# Patient Record
Sex: Female | Born: 1935 | Race: Black or African American | Hispanic: No | Smoking: Former smoker
Health system: Southern US, Community
[De-identification: ages and names within clinical notes are randomized; demographics above are authoritative.]

## PROBLEM LIST (undated history)

## (undated) DIAGNOSIS — E119 Type 2 diabetes mellitus without complications: Secondary | ICD-10-CM

## (undated) DIAGNOSIS — M545 Low back pain, unspecified: Secondary | ICD-10-CM

## (undated) DIAGNOSIS — I10 Essential (primary) hypertension: Secondary | ICD-10-CM

## (undated) DIAGNOSIS — I4891 Unspecified atrial fibrillation: Secondary | ICD-10-CM

## (undated) DIAGNOSIS — E039 Hypothyroidism, unspecified: Secondary | ICD-10-CM

## (undated) DIAGNOSIS — F419 Anxiety disorder, unspecified: Secondary | ICD-10-CM

## (undated) DIAGNOSIS — Z860100 Personal history of colon polyps, unspecified: Secondary | ICD-10-CM

## (undated) DIAGNOSIS — G894 Chronic pain syndrome: Secondary | ICD-10-CM

## (undated) DIAGNOSIS — I50810 Right heart failure, unspecified: Secondary | ICD-10-CM

## (undated) DIAGNOSIS — F329 Major depressive disorder, single episode, unspecified: Secondary | ICD-10-CM

## (undated) DIAGNOSIS — I499 Cardiac arrhythmia, unspecified: Secondary | ICD-10-CM

## (undated) DIAGNOSIS — K279 Peptic ulcer, site unspecified, unspecified as acute or chronic, without hemorrhage or perforation: Secondary | ICD-10-CM

## (undated) DIAGNOSIS — E669 Obesity, unspecified: Secondary | ICD-10-CM

## (undated) DIAGNOSIS — R601 Generalized edema: Secondary | ICD-10-CM

## (undated) DIAGNOSIS — M1990 Unspecified osteoarthritis, unspecified site: Secondary | ICD-10-CM

## (undated) DIAGNOSIS — I509 Heart failure, unspecified: Secondary | ICD-10-CM

## (undated) DIAGNOSIS — N183 Chronic kidney disease, stage 3 (moderate): Secondary | ICD-10-CM

## (undated) DIAGNOSIS — F32A Depression, unspecified: Secondary | ICD-10-CM

## (undated) DIAGNOSIS — D509 Iron deficiency anemia, unspecified: Secondary | ICD-10-CM

## (undated) DIAGNOSIS — Z86018 Personal history of other benign neoplasm: Secondary | ICD-10-CM

## (undated) DIAGNOSIS — I272 Pulmonary hypertension, unspecified: Secondary | ICD-10-CM

## (undated) DIAGNOSIS — E785 Hyperlipidemia, unspecified: Secondary | ICD-10-CM

## (undated) DIAGNOSIS — K5730 Diverticulosis of large intestine without perforation or abscess without bleeding: Secondary | ICD-10-CM

## (undated) DIAGNOSIS — D638 Anemia in other chronic diseases classified elsewhere: Secondary | ICD-10-CM

## (undated) DIAGNOSIS — J90 Pleural effusion, not elsewhere classified: Secondary | ICD-10-CM

## (undated) HISTORY — DX: Hyperlipidemia, unspecified: E78.5

## (undated) HISTORY — PX: ABDOMINAL HYSTERECTOMY: SHX81

## (undated) HISTORY — PX: LAPAROSCOPIC HYSTERECTOMY: SHX1926

## (undated) HISTORY — DX: Hypothyroidism, unspecified: E03.9

## (undated) HISTORY — DX: Low back pain: M54.5

## (undated) HISTORY — DX: Chronic kidney disease, stage 3 (moderate): N18.3

## (undated) HISTORY — DX: Chronic pain syndrome: G89.4

## (undated) HISTORY — DX: Unspecified atrial fibrillation: I48.91

## (undated) HISTORY — DX: Unspecified osteoarthritis, unspecified site: M19.90

## (undated) HISTORY — DX: Type 2 diabetes mellitus without complications: E11.9

## (undated) HISTORY — DX: Right heart failure, unspecified: I50.810

## (undated) HISTORY — DX: Obesity, unspecified: E66.9

## (undated) HISTORY — DX: Iron deficiency anemia, unspecified: D50.9

## (undated) HISTORY — DX: Personal history of other benign neoplasm: Z86.018

## (undated) HISTORY — DX: Essential (primary) hypertension: I10

## (undated) HISTORY — DX: Pulmonary hypertension, unspecified: I27.20

## (undated) HISTORY — DX: Diverticulosis of large intestine without perforation or abscess without bleeding: K57.30

## (undated) HISTORY — DX: Low back pain, unspecified: M54.50

## (undated) HISTORY — DX: Anxiety disorder, unspecified: F41.9

## (undated) HISTORY — DX: Depression, unspecified: F32.A

## (undated) HISTORY — DX: Personal history of colon polyps, unspecified: Z86.0100

## (undated) HISTORY — DX: Peptic ulcer, site unspecified, unspecified as acute or chronic, without hemorrhage or perforation: K27.9

## (undated) HISTORY — DX: Major depressive disorder, single episode, unspecified: F32.9

## (undated) HISTORY — DX: Personal history of colonic polyps: Z86.010

---

## 1999-01-12 ENCOUNTER — Other Ambulatory Visit: Admission: RE | Admit: 1999-01-12 | Discharge: 1999-01-12 | Payer: Self-pay | Admitting: Obstetrics and Gynecology

## 1999-07-01 ENCOUNTER — Other Ambulatory Visit: Admission: RE | Admit: 1999-07-01 | Discharge: 1999-07-01 | Payer: Self-pay | Admitting: Obstetrics and Gynecology

## 2000-01-19 ENCOUNTER — Encounter: Payer: Self-pay | Admitting: Internal Medicine

## 2000-01-19 ENCOUNTER — Ambulatory Visit (HOSPITAL_COMMUNITY): Admission: RE | Admit: 2000-01-19 | Discharge: 2000-01-19 | Payer: Self-pay | Admitting: Internal Medicine

## 2000-01-27 ENCOUNTER — Encounter (INDEPENDENT_AMBULATORY_CARE_PROVIDER_SITE_OTHER): Payer: Self-pay | Admitting: Specialist

## 2000-01-27 ENCOUNTER — Other Ambulatory Visit: Admission: RE | Admit: 2000-01-27 | Discharge: 2000-01-27 | Payer: Self-pay | Admitting: Gastroenterology

## 2000-02-19 ENCOUNTER — Other Ambulatory Visit: Admission: RE | Admit: 2000-02-19 | Discharge: 2000-02-19 | Payer: Self-pay | Admitting: Obstetrics and Gynecology

## 2001-07-05 ENCOUNTER — Other Ambulatory Visit: Admission: RE | Admit: 2001-07-05 | Discharge: 2001-07-05 | Payer: Self-pay | Admitting: Obstetrics and Gynecology

## 2002-01-01 ENCOUNTER — Other Ambulatory Visit: Admission: RE | Admit: 2002-01-01 | Discharge: 2002-01-01 | Payer: Self-pay | Admitting: Obstetrics and Gynecology

## 2002-07-19 ENCOUNTER — Other Ambulatory Visit: Admission: RE | Admit: 2002-07-19 | Discharge: 2002-07-19 | Payer: Self-pay | Admitting: Obstetrics and Gynecology

## 2004-05-14 ENCOUNTER — Ambulatory Visit: Payer: Self-pay | Admitting: Internal Medicine

## 2005-01-19 ENCOUNTER — Ambulatory Visit: Payer: Self-pay | Admitting: Internal Medicine

## 2005-05-18 ENCOUNTER — Ambulatory Visit: Payer: Self-pay | Admitting: Internal Medicine

## 2005-11-16 ENCOUNTER — Ambulatory Visit: Payer: Self-pay | Admitting: Internal Medicine

## 2006-05-16 ENCOUNTER — Ambulatory Visit: Payer: Self-pay | Admitting: Internal Medicine

## 2006-05-16 LAB — CONVERTED CEMR LAB
ALT: 31 units/L (ref 0–40)
AST: 27 units/L (ref 0–37)
Albumin: 3.8 g/dL (ref 3.5–5.2)
Alkaline Phosphatase: 82 units/L (ref 39–117)
BUN: 18 mg/dL (ref 6–23)
CO2: 34 meq/L — ABNORMAL HIGH (ref 19–32)
Calcium: 10 mg/dL (ref 8.4–10.5)
Chloride: 103 meq/L (ref 96–112)
Chol/HDL Ratio, serum: 3
Cholesterol: 169 mg/dL (ref 0–200)
Creatinine, Ser: 1.6 mg/dL — ABNORMAL HIGH (ref 0.4–1.2)
GFR calc non Af Amer: 34 mL/min
Glomerular Filtration Rate, Af Am: 41 mL/min/{1.73_m2}
Glucose, Bld: 117 mg/dL — ABNORMAL HIGH (ref 70–99)
HDL: 56.8 mg/dL (ref >39.0)
Hgb A1c MFr Bld: 6.3 % — ABNORMAL HIGH (ref 4.6–6.0)
LDL Cholesterol: 98 mg/dL (ref 0–99)
Potassium: 4.6 meq/L (ref 3.5–5.1)
Sodium: 144 meq/L (ref 135–145)
Total Bilirubin: 1.1 mg/dL (ref 0.3–1.2)
Total Protein: 7.1 g/dL (ref 6.0–8.3)
Triglyceride fasting, serum: 73 mg/dL (ref 0–149)
VLDL: 15 mg/dL (ref 0–40)

## 2006-10-25 ENCOUNTER — Ambulatory Visit: Payer: Self-pay | Admitting: Internal Medicine

## 2006-10-25 LAB — CONVERTED CEMR LAB
ALT: 29 units/L (ref 0–40)
AST: 26 units/L (ref 0–37)
Albumin: 3.8 g/dL (ref 3.5–5.2)
Alkaline Phosphatase: 62 units/L (ref 39–117)
BUN: 19 mg/dL (ref 6–23)
Basophils Absolute: 0 10*3/uL (ref 0.0–0.1)
Basophils Relative: 0.4 % (ref 0.0–1.0)
Bilirubin, Direct: 0.2 mg/dL (ref 0.0–0.3)
CO2: 29 meq/L (ref 19–32)
Calcium: 9.9 mg/dL (ref 8.4–10.5)
Chloride: 108 meq/L (ref 96–112)
Cholesterol: 146 mg/dL (ref 0–200)
Creatinine, Ser: 1.2 mg/dL (ref 0.4–1.2)
Creatinine,U: 76.2 mg/dL
Eosinophils Absolute: 0.1 10*3/uL (ref 0.0–0.6)
Eosinophils Relative: 1.2 % (ref 0.0–5.0)
GFR calc Af Amer: 57 mL/min
GFR calc non Af Amer: 47 mL/min
Glucose, Bld: 106 mg/dL — ABNORMAL HIGH (ref 70–99)
HCT: 35.6 % — ABNORMAL LOW (ref 36.0–46.0)
HDL: 51.4 mg/dL (ref >39.0)
Hemoglobin: 11.7 g/dL — ABNORMAL LOW (ref 12.0–15.0)
Hgb A1c MFr Bld: 6.5 % — ABNORMAL HIGH (ref 4.6–6.0)
LDL Cholesterol: 81 mg/dL (ref 0–99)
Lymphocytes Relative: 19.8 % (ref 12.0–46.0)
MCHC: 33 g/dL (ref 30.0–36.0)
MCV: 90.4 fL (ref 78.0–100.0)
Microalb Creat Ratio: 261.2 mg/g — ABNORMAL HIGH (ref 0.0–30.0)
Microalb, Ur: 19.9 mg/dL — ABNORMAL HIGH (ref 0.0–1.9)
Monocytes Absolute: 0.2 10*3/uL (ref 0.2–0.7)
Monocytes Relative: 3.6 % (ref 3.0–11.0)
Neutro Abs: 3.8 10*3/uL (ref 1.4–7.7)
Neutrophils Relative %: 75 % (ref 43.0–77.0)
Platelets: 216 10*3/uL (ref 150–400)
Potassium: 4.4 meq/L (ref 3.5–5.1)
RBC: 3.94 M/uL (ref 3.87–5.11)
RDW: 13.6 % (ref 11.5–14.6)
Sodium: 142 meq/L (ref 135–145)
TSH: 2.02 microintl units/mL (ref 0.35–5.50)
Total Bilirubin: 1.2 mg/dL (ref 0.3–1.2)
Total CHOL/HDL Ratio: 2.8
Total Protein: 6.4 g/dL (ref 6.0–8.3)
Triglycerides: 69 mg/dL (ref 0–149)
VLDL: 14 mg/dL (ref 0–40)
WBC: 5.1 10*3/uL (ref 4.5–10.5)

## 2006-11-07 ENCOUNTER — Ambulatory Visit: Payer: Self-pay | Admitting: Cardiology

## 2006-11-08 ENCOUNTER — Ambulatory Visit: Payer: Self-pay

## 2006-11-08 ENCOUNTER — Encounter: Payer: Self-pay | Admitting: Cardiology

## 2006-12-05 ENCOUNTER — Ambulatory Visit: Payer: Self-pay | Admitting: Cardiology

## 2006-12-08 ENCOUNTER — Ambulatory Visit: Payer: Self-pay

## 2006-12-08 ENCOUNTER — Ambulatory Visit: Payer: Self-pay | Admitting: Cardiology

## 2006-12-12 ENCOUNTER — Ambulatory Visit: Payer: Self-pay | Admitting: Cardiology

## 2006-12-16 ENCOUNTER — Ambulatory Visit: Payer: Self-pay | Admitting: Cardiovascular Disease

## 2006-12-23 ENCOUNTER — Ambulatory Visit: Payer: Self-pay | Admitting: Cardiology

## 2007-01-06 ENCOUNTER — Ambulatory Visit: Payer: Self-pay | Admitting: Cardiology

## 2007-02-03 ENCOUNTER — Ambulatory Visit: Payer: Self-pay | Admitting: Cardiology

## 2007-03-03 ENCOUNTER — Ambulatory Visit: Payer: Self-pay | Admitting: Cardiology

## 2007-03-05 ENCOUNTER — Encounter: Payer: Self-pay | Admitting: Internal Medicine

## 2007-03-05 DIAGNOSIS — Z860100 Personal history of colon polyps, unspecified: Secondary | ICD-10-CM | POA: Insufficient documentation

## 2007-03-05 DIAGNOSIS — F329 Major depressive disorder, single episode, unspecified: Secondary | ICD-10-CM

## 2007-03-05 DIAGNOSIS — E669 Obesity, unspecified: Secondary | ICD-10-CM

## 2007-03-05 DIAGNOSIS — E785 Hyperlipidemia, unspecified: Secondary | ICD-10-CM

## 2007-03-05 DIAGNOSIS — K5730 Diverticulosis of large intestine without perforation or abscess without bleeding: Secondary | ICD-10-CM | POA: Insufficient documentation

## 2007-03-05 DIAGNOSIS — K279 Peptic ulcer, site unspecified, unspecified as acute or chronic, without hemorrhage or perforation: Secondary | ICD-10-CM | POA: Insufficient documentation

## 2007-03-05 DIAGNOSIS — I10 Essential (primary) hypertension: Secondary | ICD-10-CM | POA: Insufficient documentation

## 2007-03-05 DIAGNOSIS — M545 Low back pain, unspecified: Secondary | ICD-10-CM

## 2007-03-05 DIAGNOSIS — D509 Iron deficiency anemia, unspecified: Secondary | ICD-10-CM

## 2007-03-05 DIAGNOSIS — I50810 Right heart failure, unspecified: Secondary | ICD-10-CM

## 2007-03-05 DIAGNOSIS — I482 Chronic atrial fibrillation, unspecified: Secondary | ICD-10-CM

## 2007-03-05 DIAGNOSIS — D259 Leiomyoma of uterus, unspecified: Secondary | ICD-10-CM

## 2007-03-05 DIAGNOSIS — M1990 Unspecified osteoarthritis, unspecified site: Secondary | ICD-10-CM | POA: Insufficient documentation

## 2007-03-05 HISTORY — DX: Iron deficiency anemia, unspecified: D50.9

## 2007-03-05 HISTORY — DX: Right heart failure, unspecified: I50.810

## 2007-03-05 HISTORY — DX: Low back pain, unspecified: M54.50

## 2007-03-05 HISTORY — DX: Hyperlipidemia, unspecified: E78.5

## 2007-03-31 ENCOUNTER — Ambulatory Visit: Payer: Self-pay | Admitting: Internal Medicine

## 2007-04-04 ENCOUNTER — Ambulatory Visit: Payer: Self-pay | Admitting: Cardiology

## 2007-04-28 ENCOUNTER — Ambulatory Visit: Payer: Self-pay | Admitting: Cardiology

## 2007-05-29 ENCOUNTER — Ambulatory Visit: Payer: Self-pay | Admitting: Internal Medicine

## 2007-06-08 ENCOUNTER — Encounter (INDEPENDENT_AMBULATORY_CARE_PROVIDER_SITE_OTHER): Payer: Self-pay | Admitting: *Deleted

## 2007-06-08 ENCOUNTER — Ambulatory Visit: Payer: Self-pay | Admitting: Internal Medicine

## 2007-06-08 LAB — CONVERTED CEMR LAB
ALT: 22 units/L (ref 0–35)
AST: 21 units/L (ref 0–37)
Albumin: 3.8 g/dL (ref 3.5–5.2)
Alkaline Phosphatase: 64 units/L (ref 39–117)
BUN: 24 mg/dL — ABNORMAL HIGH (ref 6–23)
Basophils Absolute: 0 10*3/uL (ref 0.0–0.1)
Basophils Relative: 0.6 % (ref 0.0–1.0)
Bilirubin Urine: NEGATIVE
Bilirubin, Direct: 0.2 mg/dL (ref 0.0–0.3)
CO2: 32 meq/L (ref 19–32)
Calcium: 9.9 mg/dL (ref 8.4–10.5)
Chloride: 105 meq/L (ref 96–112)
Cholesterol: 213 mg/dL (ref 0–200)
Creatinine, Ser: 1.3 mg/dL — ABNORMAL HIGH (ref 0.4–1.2)
Creatinine,U: 272.3 mg/dL
Crystals: NEGATIVE
Direct LDL: 134 mg/dL
Eosinophils Absolute: 0.1 10*3/uL (ref 0.0–0.6)
Eosinophils Relative: 2.1 % (ref 0.0–5.0)
GFR calc Af Amer: 52 mL/min
GFR calc non Af Amer: 43 mL/min
Glucose, Bld: 107 mg/dL — ABNORMAL HIGH (ref 70–99)
HCT: 35.3 % — ABNORMAL LOW (ref 36.0–46.0)
HDL: 50.6 mg/dL (ref >39.0)
Hemoglobin, Urine: NEGATIVE
Hemoglobin: 12 g/dL (ref 12.0–15.0)
Hgb A1c MFr Bld: 6.1 % — ABNORMAL HIGH (ref 4.6–6.0)
Ketones, ur: NEGATIVE mg/dL
Lymphocytes Relative: 22.9 % (ref 12.0–46.0)
MCHC: 34.1 g/dL (ref 30.0–36.0)
MCV: 91.4 fL (ref 78.0–100.0)
Microalb Creat Ratio: 49.9 mg/g — ABNORMAL HIGH (ref 0.0–30.0)
Microalb, Ur: 13.6 mg/dL — ABNORMAL HIGH (ref 0.0–1.9)
Monocytes Absolute: 0.3 10*3/uL (ref 0.2–0.7)
Monocytes Relative: 5.6 % (ref 3.0–11.0)
Mucus, UA: NEGATIVE
Neutro Abs: 3.4 10*3/uL (ref 1.4–7.7)
Neutrophils Relative %: 68.8 % (ref 43.0–77.0)
Nitrite: NEGATIVE
Platelets: 245 10*3/uL (ref 150–400)
Potassium: 4.2 meq/L (ref 3.5–5.1)
RBC: 3.86 M/uL — ABNORMAL LOW (ref 3.87–5.11)
RDW: 13.9 % (ref 11.5–14.6)
Sodium: 145 meq/L (ref 135–145)
Specific Gravity, Urine: 1.02 (ref 1.000–1.03)
TSH: 1.08 microintl units/mL (ref 0.35–5.50)
Total Bilirubin: 1.1 mg/dL (ref 0.3–1.2)
Total CHOL/HDL Ratio: 4.2
Total Protein, Urine: 100 mg/dL — AB
Total Protein: 6.9 g/dL (ref 6.0–8.3)
Triglycerides: 102 mg/dL (ref 0–149)
Urine Glucose: NEGATIVE mg/dL
Urobilinogen, UA: 0.2 (ref 0.0–1.0)
VLDL: 20 mg/dL (ref 0–40)
WBC: 4.9 10*3/uL (ref 4.5–10.5)
pH: 7.5 (ref 5.0–8.0)

## 2007-06-09 ENCOUNTER — Encounter: Payer: Self-pay | Admitting: Internal Medicine

## 2007-06-16 ENCOUNTER — Encounter: Payer: Self-pay | Admitting: Internal Medicine

## 2007-06-16 ENCOUNTER — Ambulatory Visit: Payer: Self-pay | Admitting: Internal Medicine

## 2007-06-19 ENCOUNTER — Encounter: Payer: Self-pay | Admitting: Internal Medicine

## 2007-06-26 ENCOUNTER — Ambulatory Visit: Payer: Self-pay | Admitting: Cardiology

## 2007-06-29 HISTORY — PX: COLONOSCOPY: SHX174

## 2007-07-05 ENCOUNTER — Ambulatory Visit: Payer: Self-pay | Admitting: Gastroenterology

## 2007-07-19 ENCOUNTER — Ambulatory Visit: Payer: Self-pay | Admitting: Cardiovascular Disease

## 2007-07-27 ENCOUNTER — Ambulatory Visit: Payer: Self-pay | Admitting: Gastroenterology

## 2007-07-27 ENCOUNTER — Encounter: Payer: Self-pay | Admitting: Internal Medicine

## 2007-08-09 ENCOUNTER — Ambulatory Visit: Payer: Self-pay | Admitting: Cardiovascular Disease

## 2007-08-17 ENCOUNTER — Encounter: Payer: Self-pay | Admitting: Internal Medicine

## 2007-09-06 ENCOUNTER — Ambulatory Visit: Payer: Self-pay | Admitting: Cardiology

## 2007-09-18 ENCOUNTER — Ambulatory Visit: Payer: Self-pay | Admitting: Cardiology

## 2007-10-10 ENCOUNTER — Ambulatory Visit: Payer: Self-pay | Admitting: Cardiology

## 2007-10-31 ENCOUNTER — Ambulatory Visit: Payer: Self-pay | Admitting: Cardiology

## 2007-11-28 ENCOUNTER — Ambulatory Visit: Payer: Self-pay | Admitting: Cardiology

## 2007-12-07 ENCOUNTER — Ambulatory Visit: Payer: Self-pay | Admitting: Internal Medicine

## 2007-12-08 ENCOUNTER — Encounter: Payer: Self-pay | Admitting: Internal Medicine

## 2007-12-19 ENCOUNTER — Ambulatory Visit: Payer: Self-pay | Admitting: Cardiology

## 2008-01-09 ENCOUNTER — Ambulatory Visit: Payer: Self-pay | Admitting: Internal Medicine

## 2008-01-30 ENCOUNTER — Ambulatory Visit: Payer: Self-pay | Admitting: Cardiology

## 2008-02-13 ENCOUNTER — Ambulatory Visit: Payer: Self-pay | Admitting: Cardiology

## 2008-03-05 ENCOUNTER — Ambulatory Visit: Payer: Self-pay | Admitting: Cardiology

## 2008-03-26 ENCOUNTER — Ambulatory Visit: Payer: Self-pay | Admitting: Cardiology

## 2008-04-01 ENCOUNTER — Telehealth (INDEPENDENT_AMBULATORY_CARE_PROVIDER_SITE_OTHER): Payer: Self-pay | Admitting: *Deleted

## 2008-04-04 ENCOUNTER — Ambulatory Visit: Payer: Self-pay | Admitting: Cardiology

## 2008-04-23 ENCOUNTER — Ambulatory Visit: Payer: Self-pay | Admitting: Cardiology

## 2008-04-23 ENCOUNTER — Telehealth (INDEPENDENT_AMBULATORY_CARE_PROVIDER_SITE_OTHER): Payer: Self-pay | Admitting: *Deleted

## 2008-04-23 LAB — CONVERTED CEMR LAB
INR: 4.6 — ABNORMAL HIGH (ref 0.8–1.0)
Prothrombin Time: 45.6 s — ABNORMAL HIGH (ref 10.9–13.3)

## 2008-04-29 ENCOUNTER — Ambulatory Visit: Payer: Self-pay | Admitting: Endocrinology

## 2008-04-29 ENCOUNTER — Ambulatory Visit: Payer: Self-pay | Admitting: Internal Medicine

## 2008-04-29 DIAGNOSIS — R609 Edema, unspecified: Secondary | ICD-10-CM

## 2008-05-06 ENCOUNTER — Ambulatory Visit: Payer: Self-pay | Admitting: Cardiology

## 2008-05-07 ENCOUNTER — Encounter: Payer: Self-pay | Admitting: Internal Medicine

## 2008-05-07 ENCOUNTER — Ambulatory Visit: Payer: Self-pay

## 2008-05-09 ENCOUNTER — Ambulatory Visit: Payer: Self-pay | Admitting: Cardiology

## 2008-05-28 ENCOUNTER — Ambulatory Visit: Payer: Self-pay | Admitting: Cardiovascular Disease

## 2008-06-06 ENCOUNTER — Ambulatory Visit: Payer: Self-pay | Admitting: Internal Medicine

## 2008-06-06 DIAGNOSIS — R5383 Other fatigue: Secondary | ICD-10-CM

## 2008-06-06 DIAGNOSIS — R5381 Other malaise: Secondary | ICD-10-CM | POA: Insufficient documentation

## 2008-06-06 DIAGNOSIS — M25569 Pain in unspecified knee: Secondary | ICD-10-CM | POA: Insufficient documentation

## 2008-06-06 DIAGNOSIS — I2789 Other specified pulmonary heart diseases: Secondary | ICD-10-CM

## 2008-06-10 ENCOUNTER — Telehealth: Payer: Self-pay | Admitting: Internal Medicine

## 2008-06-25 ENCOUNTER — Ambulatory Visit: Payer: Self-pay | Admitting: Cardiovascular Disease

## 2008-07-23 ENCOUNTER — Ambulatory Visit: Payer: Self-pay | Admitting: Cardiology

## 2008-08-20 ENCOUNTER — Ambulatory Visit: Payer: Self-pay | Admitting: Cardiovascular Disease

## 2008-09-03 ENCOUNTER — Ambulatory Visit: Payer: Self-pay | Admitting: Cardiology

## 2008-10-01 ENCOUNTER — Ambulatory Visit: Payer: Self-pay | Admitting: Cardiovascular Disease

## 2008-10-29 ENCOUNTER — Ambulatory Visit: Payer: Self-pay | Admitting: Internal Medicine

## 2008-11-04 ENCOUNTER — Telehealth (INDEPENDENT_AMBULATORY_CARE_PROVIDER_SITE_OTHER): Payer: Self-pay | Admitting: *Deleted

## 2008-11-12 ENCOUNTER — Ambulatory Visit: Payer: Self-pay | Admitting: Internal Medicine

## 2008-11-27 ENCOUNTER — Encounter: Payer: Self-pay | Admitting: *Deleted

## 2008-12-09 ENCOUNTER — Ambulatory Visit: Payer: Self-pay | Admitting: Cardiology

## 2008-12-09 LAB — CONVERTED CEMR LAB: POC INR: 2.2

## 2008-12-24 ENCOUNTER — Ambulatory Visit: Payer: Self-pay | Admitting: Internal Medicine

## 2008-12-25 LAB — CONVERTED CEMR LAB
BUN: 21 mg/dL (ref 6–23)
CO2: 31 meq/L (ref 19–32)
Calcium: 9.8 mg/dL (ref 8.4–10.5)
Creatinine, Ser: 1.3 mg/dL — ABNORMAL HIGH (ref 0.4–1.2)
Direct LDL: 162.2 mg/dL
GFR calc non Af Amer: 51.34 mL/min (ref >60)
Glucose, Bld: 68 mg/dL — ABNORMAL LOW (ref 70–99)

## 2009-01-01 ENCOUNTER — Encounter: Payer: Self-pay | Admitting: *Deleted

## 2009-01-07 ENCOUNTER — Ambulatory Visit: Payer: Self-pay | Admitting: Cardiology

## 2009-01-07 ENCOUNTER — Encounter (INDEPENDENT_AMBULATORY_CARE_PROVIDER_SITE_OTHER): Payer: Self-pay | Admitting: Cardiology

## 2009-01-07 LAB — CONVERTED CEMR LAB
POC INR: 2.7
Prothrombin Time: 19.9 s

## 2009-02-04 ENCOUNTER — Telehealth (INDEPENDENT_AMBULATORY_CARE_PROVIDER_SITE_OTHER): Payer: Self-pay | Admitting: *Deleted

## 2009-02-04 ENCOUNTER — Ambulatory Visit: Payer: Self-pay | Admitting: Internal Medicine

## 2009-02-04 LAB — CONVERTED CEMR LAB: Prothrombin Time: 23.2 s

## 2009-02-26 ENCOUNTER — Ambulatory Visit: Payer: Self-pay | Admitting: Internal Medicine

## 2009-02-26 LAB — CONVERTED CEMR LAB: POC INR: 2.2

## 2009-03-12 ENCOUNTER — Encounter: Payer: Self-pay | Admitting: Internal Medicine

## 2009-03-12 ENCOUNTER — Telehealth: Payer: Self-pay | Admitting: Internal Medicine

## 2009-03-25 ENCOUNTER — Ambulatory Visit: Payer: Self-pay | Admitting: Cardiology

## 2009-04-22 ENCOUNTER — Ambulatory Visit: Payer: Self-pay | Admitting: Cardiovascular Disease

## 2009-05-06 ENCOUNTER — Ambulatory Visit: Payer: Self-pay | Admitting: Cardiology

## 2009-05-20 ENCOUNTER — Ambulatory Visit: Payer: Self-pay | Admitting: Cardiovascular Disease

## 2009-05-20 LAB — CONVERTED CEMR LAB: POC INR: 2.2

## 2009-06-17 ENCOUNTER — Ambulatory Visit: Payer: Self-pay | Admitting: Internal Medicine

## 2009-06-24 ENCOUNTER — Ambulatory Visit: Payer: Self-pay | Admitting: Internal Medicine

## 2009-06-24 DIAGNOSIS — M1710 Unilateral primary osteoarthritis, unspecified knee: Secondary | ICD-10-CM

## 2009-06-24 LAB — CONVERTED CEMR LAB
ALT: 17 units/L (ref 0–35)
AST: 23 units/L (ref 0–37)
Albumin: 4.1 g/dL (ref 3.5–5.2)
Basophils Absolute: 0 10*3/uL (ref 0.0–0.1)
CO2: 31 meq/L (ref 19–32)
Chloride: 108 meq/L (ref 96–112)
Cholesterol: 225 mg/dL — ABNORMAL HIGH (ref 0–200)
Creatinine,U: 103.3 mg/dL
GFR calc non Af Amer: 62.17 mL/min (ref >60)
Glucose, Bld: 92 mg/dL (ref 70–99)
HCT: 35.6 % — ABNORMAL LOW (ref 36.0–46.0)
Hemoglobin, Urine: NEGATIVE
Hemoglobin: 11.9 g/dL — ABNORMAL LOW (ref 12.0–15.0)
Lymphs Abs: 1.1 10*3/uL (ref 0.7–4.0)
MCV: 94.8 fL (ref 78.0–100.0)
Microalb Creat Ratio: 42.6 mg/g — ABNORMAL HIGH (ref 0.0–30.0)
Monocytes Absolute: 0.2 10*3/uL (ref 0.1–1.0)
Monocytes Relative: 6.1 % (ref 3.0–12.0)
Neutro Abs: 2.4 10*3/uL (ref 1.4–7.7)
Nitrite: NEGATIVE
Potassium: 4.5 meq/L (ref 3.5–5.1)
RDW: 12.4 % (ref 11.5–14.6)
Sodium: 146 meq/L — ABNORMAL HIGH (ref 135–145)
Urobilinogen, UA: 0.2 (ref 0.0–1.0)
VLDL: 18 mg/dL (ref 0.0–40.0)

## 2009-07-15 ENCOUNTER — Ambulatory Visit: Payer: Self-pay | Admitting: Cardiology

## 2009-07-15 ENCOUNTER — Encounter (INDEPENDENT_AMBULATORY_CARE_PROVIDER_SITE_OTHER): Payer: Self-pay | Admitting: Cardiology

## 2009-07-17 ENCOUNTER — Telehealth: Payer: Self-pay | Admitting: Internal Medicine

## 2009-08-12 ENCOUNTER — Ambulatory Visit: Payer: Self-pay | Admitting: Internal Medicine

## 2009-08-27 ENCOUNTER — Encounter: Payer: Self-pay | Admitting: Internal Medicine

## 2009-09-02 ENCOUNTER — Ambulatory Visit: Payer: Self-pay | Admitting: Cardiovascular Disease

## 2009-09-30 ENCOUNTER — Ambulatory Visit: Payer: Self-pay | Admitting: Internal Medicine

## 2009-10-14 ENCOUNTER — Ambulatory Visit: Payer: Self-pay | Admitting: Cardiovascular Disease

## 2009-11-04 ENCOUNTER — Ambulatory Visit: Payer: Self-pay | Admitting: Cardiology

## 2009-11-18 ENCOUNTER — Ambulatory Visit: Payer: Self-pay | Admitting: Cardiology

## 2009-11-18 LAB — CONVERTED CEMR LAB: POC INR: 2.9

## 2009-11-25 ENCOUNTER — Telehealth: Payer: Self-pay | Admitting: Internal Medicine

## 2009-12-16 ENCOUNTER — Ambulatory Visit: Payer: Self-pay

## 2009-12-16 LAB — CONVERTED CEMR LAB: POC INR: 1.8

## 2009-12-23 ENCOUNTER — Ambulatory Visit: Payer: Self-pay | Admitting: Internal Medicine

## 2009-12-23 DIAGNOSIS — F411 Generalized anxiety disorder: Secondary | ICD-10-CM | POA: Insufficient documentation

## 2009-12-23 LAB — CONVERTED CEMR LAB
CO2: 28 meq/L (ref 19–32)
Calcium: 9.8 mg/dL (ref 8.4–10.5)
Cholesterol: 132 mg/dL (ref 0–200)
Glucose, Bld: 70 mg/dL (ref 70–99)
HDL: 58.9 mg/dL (ref >39.00)
LDL Cholesterol: 60 mg/dL (ref 0–99)
Potassium: 4 meq/L (ref 3.5–5.1)
Sodium: 146 meq/L — ABNORMAL HIGH (ref 135–145)
Total CHOL/HDL Ratio: 2
Triglycerides: 65 mg/dL (ref 0.0–149.0)

## 2010-01-06 ENCOUNTER — Ambulatory Visit: Payer: Self-pay | Admitting: Cardiovascular Disease

## 2010-01-27 ENCOUNTER — Ambulatory Visit: Payer: Self-pay | Admitting: Cardiology

## 2010-03-13 ENCOUNTER — Emergency Department (HOSPITAL_COMMUNITY): Admission: EM | Admit: 2010-03-13 | Discharge: 2010-03-13 | Payer: Self-pay | Admitting: Emergency Medicine

## 2010-06-12 ENCOUNTER — Telehealth: Payer: Self-pay | Admitting: Internal Medicine

## 2010-06-12 LAB — CONVERTED CEMR LAB
ALT: 15 units/L (ref 0–35)
AST: 18 units/L (ref 0–37)
Alkaline Phosphatase: 58 units/L (ref 39–117)
Basophils Absolute: 0 10*3/uL (ref 0.0–0.1)
Bilirubin, Direct: 0.2 mg/dL (ref 0.0–0.3)
Blood, UA: NEGATIVE
CO2: 32 meq/L (ref 19–32)
Chloride: 105 meq/L (ref 96–112)
Creatinine, Ser: 1.1 mg/dL (ref 0.4–1.2)
Creatinine,U: 280.8 mg/dL
Eosinophils Absolute: 0 10*3/uL (ref 0.0–0.7)
Hemoglobin: 10.8 g/dL — ABNORMAL LOW (ref 12.0–15.0)
LDL Cholesterol: 84 mg/dL (ref 0–99)
Lymphocytes Relative: 29.5 % (ref 12.0–46.0)
MCHC: 33.3 g/dL (ref 30.0–36.0)
Microalb Creat Ratio: 4.3 mg/g (ref 0.0–30.0)
Microalb, Ur: 12.2 mg/dL — ABNORMAL HIGH (ref 0.0–1.9)
Monocytes Relative: 8.7 % (ref 3.0–12.0)
Neutro Abs: 2.1 10*3/uL (ref 1.4–7.7)
Neutrophils Relative %: 60.5 % (ref 43.0–77.0)
Potassium: 4 meq/L (ref 3.5–5.1)
RBC: 3.45 M/uL — ABNORMAL LOW (ref 3.87–5.11)
RDW: 15.3 % — ABNORMAL HIGH (ref 11.5–14.6)
Total Bilirubin: 1.1 mg/dL (ref 0.3–1.2)
Total CHOL/HDL Ratio: 3
Total Protein, Urine: 30 mg/dL
Total Protein: 6.6 g/dL (ref 6.0–8.3)
Urine Glucose: NEGATIVE mg/dL
Urobilinogen, UA: 1 (ref 0.0–1.0)
VLDL: 13.2 mg/dL (ref 0.0–40.0)

## 2010-06-16 ENCOUNTER — Ambulatory Visit: Payer: Self-pay | Admitting: Internal Medicine

## 2010-06-23 ENCOUNTER — Ambulatory Visit: Payer: Self-pay | Admitting: Internal Medicine

## 2010-06-23 DIAGNOSIS — G894 Chronic pain syndrome: Secondary | ICD-10-CM

## 2010-06-23 DIAGNOSIS — N390 Urinary tract infection, site not specified: Secondary | ICD-10-CM

## 2010-06-23 HISTORY — DX: Chronic pain syndrome: G89.4

## 2010-07-01 ENCOUNTER — Ambulatory Visit: Admission: RE | Admit: 2010-07-01 | Discharge: 2010-07-01 | Payer: Self-pay | Source: Home / Self Care

## 2010-07-09 ENCOUNTER — Encounter: Payer: Self-pay | Admitting: Gastroenterology

## 2010-07-15 ENCOUNTER — Ambulatory Visit: Admission: RE | Admit: 2010-07-15 | Discharge: 2010-07-15 | Payer: Self-pay | Source: Home / Self Care

## 2010-07-15 LAB — CONVERTED CEMR LAB: POC INR: 5.1

## 2010-07-26 LAB — CONVERTED CEMR LAB
ALT: 22 units/L (ref 0–35)
AST: 23 units/L (ref 0–37)
Albumin: 3.7 g/dL (ref 3.5–5.2)
Alkaline Phosphatase: 69 units/L (ref 39–117)
BUN: 19 mg/dL (ref 6–23)
Basophils Absolute: 0 10*3/uL (ref 0.0–0.1)
Basophils Relative: 0.4 % (ref 0.0–3.0)
CO2: 33 meq/L — ABNORMAL HIGH (ref 19–32)
Cholesterol: 214 mg/dL (ref 0–200)
Direct LDL: 132 mg/dL
Eosinophils Absolute: 0.1 10*3/uL (ref 0.0–0.7)
GFR calc Af Amer: 51 mL/min
Glucose, Bld: 118 mg/dL — ABNORMAL HIGH (ref 70–99)
Hemoglobin: 11.6 g/dL — ABNORMAL LOW (ref 12.0–15.0)
Hgb A1c MFr Bld: 6.3 % — ABNORMAL HIGH (ref 4.6–6.0)
Ketones, ur: NEGATIVE mg/dL
Lymphocytes Relative: 27.4 % (ref 12.0–46.0)
MCHC: 33.7 g/dL (ref 30.0–36.0)
MCV: 92.9 fL (ref 78.0–100.0)
Microalb Creat Ratio: 135.6 mg/g — ABNORMAL HIGH (ref 0.0–30.0)
Neutro Abs: 2.5 10*3/uL (ref 1.4–7.7)
Potassium: 4.1 meq/L (ref 3.5–5.1)
RBC: 3.69 M/uL — ABNORMAL LOW (ref 3.87–5.11)
Saturation Ratios: 14.5 % — ABNORMAL LOW (ref 20.0–50.0)
Specific Gravity, Urine: 1.02 (ref 1.000–1.03)
TSH: 1.26 microintl units/mL (ref 0.35–5.50)
Total Protein, Urine: 100 mg/dL — AB
Total Protein: 6.6 g/dL (ref 6.0–8.3)
Urine Glucose: NEGATIVE mg/dL
Vit D, 1,25-Dihydroxy: 13 — ABNORMAL LOW (ref 30–89)
pH: 7 (ref 5.0–8.0)

## 2010-07-28 NOTE — Miscellaneous (Signed)
Summary: Orders Update  Clinical Lists Changes  Orders: Added new Referral order of Orthopedic Surgeon Referral (Ortho Surgeon) - Signed 

## 2010-07-28 NOTE — Medication Information (Signed)
Summary: rov/tm  Anticoagulant Therapy  Managed by: Weston Brass, PharmD Referring MD: Rollene Rotunda MD PCP: Corwin Levins MD Supervising MD: Eden Emms MD, Theron Arista Indication 1: Atrial Fibrillation (ICD-427.31) Lab Used: [facility] Site: [facility] INR POC 2.0 INR RANGE 2 - 3  Dietary changes: no    Health status changes: no    Bleeding/hemorrhagic complications: no    Recent/future hospitalizations: no    Any changes in medication regimen? no    Recent/future dental: no  Any missed doses?: no       Is patient compliant with meds? yes       Allergies: 1)  ! * Clonidine  Anticoagulation Management History:      The patient is taking warfarin and comes in today for a routine follow up visit.  Positive risk factors for bleeding include an age of 50 years or older and presence of serious comorbidities.  The bleeding index is 'intermediate risk'.  Positive CHADS2 values include History of HTN, Age > 75 years old, and History of Diabetes.  The start date was 12/02/2006.  Her last INR was 2.6.  Anticoagulation responsible provider: Eden Emms MD, Theron Arista.  INR POC: 2.0.  Cuvette Lot#: [id number].  Exp: 10/2010.    Anticoagulation Management Assessment/Plan:      The patient's current anticoagulation dose is Warfarin sodium 5 mg  tabs: Take as directed by coumadin clinic..  The target INR is 2 - 3.  The next INR is due 11/04/2009.  Anticoagulation instructions were given to patient.  Results were reviewed/authorized by Weston Brass, PharmD.  She was notified by Weston Brass PharmD.         Prior Anticoagulation Instructions: INR 1.9 Change dose to 1 1/2 pills everyday except 1 pill on Mondays and Fridays. Recheck in 2 weeks.   Current Anticoagulation Instructions: INR 2.0  Continue same dose of 1 1/2 tablets every day except 1 tablet on Monday and Friday

## 2010-07-28 NOTE — Medication Information (Signed)
Summary: rov/sp  Anticoagulant Therapy  Managed by: Eda Keys, PharmD Referring MD: Rollene Rotunda MD PCP: Corwin Levins MD Supervising MD: Jens Som MD, Arlys John Indication 1: Atrial Fibrillation (ICD-427.31) Lab Used: [facility] Site: [facility] INR POC 1.5 INR RANGE 2 - 3  Dietary changes: no    Health status changes: no    Bleeding/hemorrhagic complications: no    Recent/future hospitalizations: no    Any changes in medication regimen? no    Recent/future dental: no  Any missed doses?: no       Is patient compliant with meds? yes       Allergies: 1)  ! * Clonidine  Anticoagulation Management History:      The patient is taking warfarin and comes in today for a routine follow up visit.  Positive risk factors for bleeding include an age of 35 years or older and presence of serious comorbidities.  The bleeding index is 'intermediate risk'.  Positive CHADS2 values include History of HTN, Age > 32 years old, and History of Diabetes.  The start date was 12/02/2006.  Her last INR was 2.6.  Anticoagulation responsible provider: Jens Som MD, Arlys John.  INR POC: 1.5.  Cuvette Lot#: [id number].  Exp: 01/2011.    Anticoagulation Management Assessment/Plan:      The patient's current anticoagulation dose is Warfarin sodium 5 mg  tabs: Take as directed by coumadin clinic..  The target INR is 2 - 3.  The next INR is due 11/18/2009.  Anticoagulation instructions were given to patient.  Results were reviewed/authorized by Eda Keys, PharmD.  She was notified by Eda Keys.         Prior Anticoagulation Instructions: INR 2.0  Continue same dose of 1 1/2 tablets every day except 1 tablet on Monday and Friday   Current Anticoagulation Instructions: INR 1.5  Take 2 tablets today.  Then return to normal dosing schedule of 1 tablet on Monday and Friday and 1.5 tablets all other days.  Return to clinic in 2 weeks.

## 2010-07-28 NOTE — Progress Notes (Signed)
Summary: med refill  Phone Note Refill Request  on July 17, 2009 10:51 AM  Refills Requested: Medication #1:  ALPRAZOLAM 0.25 MG TABS Take 1 tablet by mouth twice a day as needed   Dosage confirmed as above?Dosage Confirmed   [facility] [phone] Initial call taken by: Scharlene Gloss,  July 17, 2009 10:52 AM  Follow-up for Phone Call        done hardcopy to [facility] side B - dahlia  Follow-up by: Corwin Levins MD,  July 17, 2009 12:18 PM  Additional Follow-up for Phone Call Additional follow up Details #1::        rx faxed to pharmacy Additional Follow-up by: Margaret Pyle, CMA,  July 17, 2009 12:59 PM    Prescriptions: ALPRAZOLAM 0.25 MG TABS (ALPRAZOLAM) Take 1 tablet by mouth twice a day as needed  #60 x 3   Entered and Authorized by:   Corwin Levins MD   Signed by:   Corwin Levins MD on 07/17/2009   Method used:   Print then Give to Patient   RxID:   [id number]

## 2010-07-28 NOTE — Medication Information (Signed)
Summary: rov/ewj  Anticoagulant Therapy  Managed by: Cloyde Reams, RN, BSN Referring MD: Rollene Rotunda MD PCP: Corwin Levins MD Supervising MD: Eden Emms MD, Theron Arista Indication 1: Atrial Fibrillation (ICD-427.31) Lab Used: [facility] Site: [facility] INR POC 2.1 INR RANGE 2 - 3  Dietary changes: yes       Details: decr appetite d/t illness.  Health status changes: no    Bleeding/hemorrhagic complications: no    Recent/future hospitalizations: no    Any changes in medication regimen? no    Recent/future dental: no  Any missed doses?: no       Is patient compliant with meds? yes      Comments: Pt requests 4 weeks d/t financial constraints.  Aware of risks.  Allergies (verified): 1)  ! * Clonidine  Anticoagulation Management History:      The patient is taking warfarin and comes in today for a routine follow up visit.  Positive risk factors for bleeding include an age of 75 years or older and presence of serious comorbidities.  The bleeding index is 'intermediate risk'.  Positive CHADS2 values include History of HTN, Age > 75 years old, and History of Diabetes.  The start date was 12/02/2006.  Her last INR was 2.6.  Anticoagulation responsible provider: Eden Emms MD, Theron Arista.  INR POC: 2.1.  Cuvette Lot#: [id number].  Exp: 10/2010.    Anticoagulation Management Assessment/Plan:      The patient's current anticoagulation dose is Warfarin sodium 5 mg  tabs: Take as directed by coumadin clinic..  The target INR is 2 - 3.  The next INR is due 09/30/2009.  Anticoagulation instructions were given to patient.  Results were reviewed/authorized by Cloyde Reams, RN, BSN.  She was notified by Cloyde Reams RN.         Prior Anticoagulation Instructions: INR 1.6  Take an extra 1/2 tablet today then start taking 1.5 tablets daily except 1 tablet on Mondays, Wednesdays and Fridays.  Recheck in 2 weeks.    Current Anticoagulation Instructions: INR 2.1  Continue on same dosage 1.5 tablets daily  except 1 tablet on Mondays, Wednesdays, and Fridays.  Recheck in 3 weeks.

## 2010-07-28 NOTE — Medication Information (Signed)
Summary: rov/tm  Anticoagulant Therapy  Managed by: Shelby Dubin, PharmD, BCPS, CPP Referring MD: Rollene Rotunda MD PCP: Corwin Levins MD Supervising MD: Myrtis Ser MD, Tinnie Gens Indication 1: Atrial Fibrillation (ICD-427.31) Lab Used: [facility] Site: [facility] INR POC 2.6 INR RANGE 2 - 3  Dietary changes: no    Health status changes: no    Bleeding/hemorrhagic complications: no    Recent/future hospitalizations: no    Any changes in medication regimen? yes       Details: changed dcn-100 to tramadol 50 mg q6h prn pain  Recent/future dental: no  Any missed doses?: no       Is patient compliant with meds? yes       Allergies (verified): 1)  ! * Clonidine  Anticoagulation Management History:      The patient is taking warfarin and comes in today for a routine follow up visit.  Positive risk factors for bleeding include an age of 18 years or older and presence of serious comorbidities.  The bleeding index is 'intermediate risk'.  Positive CHADS2 values include History of HTN, Age > 1 years old, and History of Diabetes.  The start date was 12/02/2006.  Her last INR was 2.6.  Anticoagulation responsible provider: Myrtis Ser MD, Tinnie Gens.  INR POC: 2.6.  Cuvette Lot#: [id number].  Exp: 07/2010.    Anticoagulation Management Assessment/Plan:      The patient's current anticoagulation dose is Warfarin sodium 5 mg  tabs: Take as directed by coumadin clinic..  The target INR is 2 - 3.  The next INR is due 08/12/2009.  Anticoagulation instructions were given to patient.  Results were reviewed/authorized by Shelby Dubin, PharmD, BCPS, CPP.  She was notified by Shelby Dubin PharmD, BCPS, CPP.         Prior Anticoagulation Instructions: INR = 2.6  The patient is to continue with the same dose of coumadin.  This dosage includes: 1 tablet all days except sun/tues/thurs take 1.5 tablets  Current Anticoagulation Instructions: INR 2.6  Continue 1 tab daily except 1.5 tabs each Sunday, Tuesday, and  Thursday.  Recheck in 4 weeks.

## 2010-07-28 NOTE — Progress Notes (Signed)
Summary: Med Refill  Phone Note Refill Request  on Nov 25, 2009 9:47 AM  Refills Requested: Medication #1:  ALPRAZOLAM 0.25 MG TABS Take 1 tablet by mouth twice a day as needed   Dosage confirmed as above?Dosage Confirmed   Notes[facility], [phone] Initial call taken by: Scharlene Gloss,  Nov 25, 2009 9:48 AM    New/Updated Medications: ALPRAZOLAM 0.25 MG TABS (ALPRAZOLAM) Take 1 tablet by mouth twice a day as needed Prescriptions: ALPRAZOLAM 0.25 MG TABS (ALPRAZOLAM) Take 1 tablet by mouth twice a day as needed  #60 x 3   Entered and Authorized by:   Corwin Levins MD   Signed by:   Corwin Levins MD on 11/25/2009   Method used:   Print then Give to Patient   RxID:   [id number]  done hardcopy to [facility] side B - dahlia  Corwin Levins MD  Nov 25, 2009 10:22 AM   Rx faxed to pharmacy Margaret Pyle, CMA  Nov 25, 2009 10:59 AM

## 2010-07-28 NOTE — Letter (Signed)
Summary: Handout Printed  Printed Handout:  - Coumadin Instructions-w/out Meds 

## 2010-07-28 NOTE — Medication Information (Signed)
Summary: rov.mp  Anticoagulant Therapy  Managed by: Cloyde Reams, RN, BSN Referring MD: Rollene Rotunda MD PCP: Corwin Levins MD Supervising MD: Graciela Husbands MD, Viviann Spare Indication 1: Atrial Fibrillation (ICD-427.31) Lab Used: [facility] Site: [facility] INR POC 1.6 INR RANGE 2 - 3  Dietary changes: yes       Details: Decr appetite, unable to taste or smell d/t cold.    Health status changes: no    Bleeding/hemorrhagic complications: no    Recent/future hospitalizations: no    Any changes in medication regimen? no    Recent/future dental: no  Any missed doses?: yes     Details: May have missed 1 dose approx 2 weeks ago.    Is patient compliant with meds? yes       Allergies: 1)  ! * Clonidine  Anticoagulation Management History:      The patient is taking warfarin and comes in today for a routine follow up visit.  Positive risk factors for bleeding include an age of 75 years or older and presence of serious comorbidities.  The bleeding index is 'intermediate risk'.  Positive CHADS2 values include History of HTN, Age > 35 years old, and History of Diabetes.  The start date was 12/02/2006.  Her last INR was 2.6.  Anticoagulation responsible provider: Graciela Husbands MD, Viviann Spare.  INR POC: 1.6.  Cuvette Lot#: [id number].  Exp: 09/2010.    Anticoagulation Management Assessment/Plan:      The patient's current anticoagulation dose is Warfarin sodium 5 mg  tabs: Take as directed by coumadin clinic..  The target INR is 2 - 3.  The next INR is due 09/02/2009.  Anticoagulation instructions were given to patient.  Results were reviewed/authorized by Cloyde Reams, RN, BSN.  She was notified by Cloyde Reams RN.         Prior Anticoagulation Instructions: INR 2.6  Continue 1 tab daily except 1.5 tabs each Sunday, Tuesday, and Thursday.  Recheck in 4 weeks.    Current Anticoagulation Instructions: INR 1.6  Take an extra 1/2 tablet today then start taking 1.5 tablets daily except 1 tablet on Mondays,  Wednesdays and Fridays.  Recheck in 2 weeks.

## 2010-07-28 NOTE — Medication Information (Signed)
Summary: rov/sp   Anticoagulant Therapy  Managed by: Cloyde Reams, RN, BSN Referring MD: Rollene Rotunda MD PCP: Corwin Levins MD Supervising MD: Juanda Chance MD, Bruce Indication 1: Atrial Fibrillation (ICD-427.31) Lab Used: [facility] Site: [facility] INR POC 1.7 INR RANGE 2 - 3  Dietary changes: no    Health status changes: yes       Details: Incr swelling in B lower extremities x 2 weeks, better now.    Bleeding/hemorrhagic complications: no    Recent/future hospitalizations: no    Any changes in medication regimen? no    Recent/future dental: no  Any missed doses?: no       Is patient compliant with meds? yes       Allergies: 1)  ! * Clonidine  Anticoagulation Management History:      The patient is taking warfarin and comes in today for a routine follow up visit.  Positive risk factors for bleeding include an age of 26 years or older and presence of serious comorbidities.  The bleeding index is 'intermediate risk'.  Positive CHADS2 values include History of HTN, Age > 36 years old, and History of Diabetes.  The start date was 12/02/2006.  Her last INR was 2.6.  Anticoagulation responsible provider: Juanda Chance MD, Smitty Cords.  INR POC: 1.7.  Cuvette Lot#: [id number].  Exp: 03/2011.    Anticoagulation Management Assessment/Plan:      The patient's current anticoagulation dose is Warfarin sodium 5 mg  tabs: Take as directed by coumadin clinic..  The target INR is 2 - 3.  The next INR is due 02/17/2010.  Anticoagulation instructions were given to patient.  Results were reviewed/authorized by Cloyde Reams, RN, BSN.  She was notified by Cloyde Reams RN.         Prior Anticoagulation Instructions: INR 3.2  Take 1 tablet today then continue same dose of 1 1/2 tablets every day except 1 tablet on Monday and Friday.  Recheck in 3 weeks.   Current Anticoagulation Instructions: INR 1.7  Take 2 tablets today, then resume same dosage 1.5 tablets daily except 1 tablet on Mondays and Fridays.   Recheck in 3 weeks.

## 2010-07-28 NOTE — Assessment & Plan Note (Signed)
Summary: 6 MO ROV /NWS  #   Vital Signs:  Patient profile:   77 year old female Height:      66 inches Weight:      200.50 pounds BMI:     32.48 O2 Sat:      96 % on Room air Temp:     97.6 degrees F oral Pulse rate:   73 / minute BP sitting:   152 / 78  (left arm) Cuff size:   regular  Vitals Entered By: Zella Ball Ewing CMA Duncan Dull) (December 23, 2009 9:27 AM)  O2 Flow:  Room air CC: 6 month ROV/RE   Primary Care Provider:  Corwin Levins MD  CC:  6 month ROV/RE.  History of Present Illness: here to f/u; now a great grandmother,  overall doing ok except for chronic right knee pain due to DJD; has seen ortho who recommends surgury, but she is holding for now;  tramadol does not seem to control the pain and has breakthrough at least once daily; fortunately no effusion or recent falls;  Pt denies CP, sob, doe, wheezing, orthopnea, pnd, worsening LE edema, palps, dizziness or syncope   Pt denies new neuro symptoms such as headache, facial or extremity weakness Pt denies polydipsia, polyuria, or low sugar symptoms such as shakiness improved with eating.  Overall good compliance with meds, trying to follow low chol, DM diet, wt stable, little excercise however   Does c/o incresaed anxiety without panic , worsening depression or suicidal ideation.  Seems to correllate as well with recurrent chronic LBP no change recent without bowel or bladder change, LE pain/weak/numb, falls or gait change,m or fever, wt loss, night sweats or other constitutional symptoms.   Also due for dental work soon.  husband now deceased x 3 yrs  Problems Prior to Update: 1)  Osteoarthritis, Knee, Right  (ICD-715.96) 2)  Knee Pain, Right  (ICD-719.46) 3)  Pulmonary Hypertension  (ICD-416.8) 4)  Fatigue  (ICD-780.79) 5)  Preventive Health Care  (ICD-V70.0) 6)  Peripheral Edema  (ICD-782.3) 7)  Preventive Health Care  (ICD-V70.0) 8)  Fibrillation, Atrial  (ICD-427.31) 9)  Low Back Pain  (ICD-724.2) 10)  Osteoarthritis   (ICD-715.90) 11)  Diverticulosis, Colon  (ICD-562.10) 12)  Colonic Polyps, Hx of  (ICD-V12.72) 13)  Fibroids, Uterus  (ICD-218.9) 14)  Peptic Ulcer Disease  (ICD-533.90) 15)  Hypertension  (ICD-401.9) 16)  Hyperlipidemia  (ICD-272.4) 17)  Diabetes Mellitus, Type II  (ICD-250.00) 18)  Depression  (ICD-311) 19)  Anemia-iron Deficiency  (ICD-280.9) 20)  Obesity  (ICD-278.00)  Medications Prior to Update: 1)  Alprazolam 0.25 Mg Tabs (Alprazolam) .... Take 1 Tablet By Mouth Twice A Day As Needed 2)  Diltiazem Hcl Er Beads 360 Mg Xr24h-Cap (Diltiazem Hcl Er Beads) .Marland Kitchen.. 1po Once Daily 3)  Furosemide 40 Mg Tabs (Furosemide) .Marland Kitchen.. 1 and 1/2  Tablet By Mouth Once A Day 4)  Glipizide Xl 5 Mg  Tb24 (Glipizide) .Marland Kitchen.. 1 By Mouth Qam Only 5)  Hydralazine Hcl 50 Mg Tabs (Hydralazine Hcl) .... Take 1 Tablet By Mouth Three Times A Day 6)  Labetalol Hcl 300 Mg Tabs (Labetalol Hcl) .... Take 1 Tablet By Mouth Twice A Day 7)  Levothyroxine Sodium 25 Mcg Tabs (Levothyroxine Sodium) .... Take 1 Tablet By Mouth Once A Day 8)  Lovastatin 40 Mg Tabs (Lovastatin) .... Take 2 Tablet By Mouth Once A Day 9)  Metformin Hcl 500 Mg Tabs (Metformin Hcl) .... Take 1 Tablet By Mouth Once A Day  10)  Warfarin Sodium 5 Mg  Tabs (Warfarin Sodium) .... Take As Directed By Coumadin Clinic. 11)  Citalopram Hydrobromide 10 Mg  Tabs (Citalopram Hydrobromide) .Marland Kitchen.. 1 By Mouth Once Daily 12)  Tramadol Hcl 50 Mg Tabs (Tramadol Hcl) .Marland Kitchen.. 1po Q 6 Hrs As Needed For Pain 13)  Vitamin D 1000 Unit Tabs (Cholecalciferol) .Marland Kitchen.. 1 By Mouth Once Daily 14)  Metamucil 30.9 % Powd (Psyllium) .... As Needed  Current Medications (verified): 1)  Alprazolam 0.25 Mg Tabs (Alprazolam) .... Take 1 Tablet By Mouth Twice A Day As Needed 2)  Diltiazem Hcl Er Beads 360 Mg Xr24h-Cap (Diltiazem Hcl Er Beads) .Marland Kitchen.. 1po Once Daily 3)  Furosemide 40 Mg Tabs (Furosemide) .Marland Kitchen.. 1 and 1/2  Tablet By Mouth Once A Day 4)  Glipizide Xl 5 Mg  Tb24 (Glipizide) .Marland Kitchen.. 1 By  Mouth Qam Only 5)  Hydralazine Hcl 50 Mg Tabs (Hydralazine Hcl) .... Take 1 Tablet By Mouth Three Times A Day 6)  Labetalol Hcl 300 Mg Tabs (Labetalol Hcl) .... Take 1 Tablet By Mouth Twice A Day 7)  Levothyroxine Sodium 25 Mcg Tabs (Levothyroxine Sodium) .... Take 1 Tablet By Mouth Once A Day 8)  Lovastatin 40 Mg Tabs (Lovastatin) .... Take 2 Tablet By Mouth Once A Day 9)  Metformin Hcl 500 Mg Tabs (Metformin Hcl) .... Take 1 Tablet By Mouth Once A Day 10)  Warfarin Sodium 5 Mg  Tabs (Warfarin Sodium) .... Take As Directed By Coumadin Clinic. 11)  Citalopram Hydrobromide 20 Mg Tabs (Citalopram Hydrobromide) .Marland Kitchen.. 1po Once Daily 12)  Tramadol Hcl 50 Mg Tabs (Tramadol Hcl) .Marland Kitchen.. 1po Q 6 Hrs As Needed For Pain 13)  Vitamin D 1000 Unit Tabs (Cholecalciferol) .Marland Kitchen.. 1 By Mouth Once Daily 14)  Metamucil 30.9 % Powd (Psyllium) .... As Needed 15)  Hydrocodone-Acetaminophen 5-325 Mg Tabs (Hydrocodone-Acetaminophen) .Marland Kitchen.. 1 By Mouth Two Times A Day As Needed Uncontrolled Pain  Allergies (verified): 1)  ! 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   Head:  normocephalic and atraumatic.   Eyes:  vision grossly intact, pupils equal, and pupils round.   Ears:  R ear normal and L ear normal.   Nose:  no external deformity and no nasal discharge.     Mouth:  no gingival abnormalities and pharynx pink and moist.   Neck:  supple and no masses.   Lungs:  normal respiratory effort and normal breath sounds.   Heart:  normal rate and regular rhythm.   Abdomen:  soft, non-tender, and normal bowel sounds.   Msk:  right knee marked crepitus, no effusion;  spine and paravertebral spine nontender Extremities:  no edema, no erythema , though she states late in the day she has ankle swelling that goes away at night Neurologic:  cranial nerves II-XII intact and strength normal in all extremities.   Psych:  depressed affect and moderately anxious.     Impression &  Recommendations:  Problem # 1:  KNEE PAIN, RIGHT, CHRONIC (ICD-719.46)  Her updated medication list for this problem includes:    Tramadol Hcl 50 Mg Tabs (Tramadol hcl) .Marland Kitchen... 1po q 6 hrs as needed for pain    Hydrocodone-acetaminophen 5-325 Mg Tabs (Hydrocodone-acetaminophen) .Marland Kitchen... 1 by mouth two times a day as needed uncontrolled pain chronic, mod to severe with flare of pain - for as needed vicodin for breakthrough pain, encouraged her to pursue the knee replacment; she will need cardiac clearance prior  Problem # 2:  LOW BACK PAIN (ICD-724.2)  Her updated medication list for this problem includes:    Tramadol Hcl 50 Mg Tabs (Tramadol hcl) .Marland Kitchen... 1po q 6 hrs as needed for pain    Hydrocodone-acetaminophen 5-325 Mg Tabs (Hydrocodone-acetaminophen) .Marland Kitchen... 1 by mouth two times a day as needed uncontrolled pain chroinc recurrent, no change, treat as above, f/u any worsening signs or symptoms   Problem # 3:  DIABETES MELLITUS, TYPE II (ICD-250.00)  Her updated medication list for this problem includes:    Glipizide Xl 5 Mg Tb24 (Glipizide) .Marland Kitchen... 1 by mouth qam only    Metformin Hcl 500 Mg Tabs (Metformin hcl) .Marland Kitchen... Take 1 tablet by mouth once a day for some reason every few days she will take 2 of the glipizide;  I asked her not to do this as her a1c last vist was excellent and she could  have severe hypolycemia reaction even requiring hospn;  Pt to cont DM diet, excercise, wt loss efforts; to check labs today   Orders: TLB-BMP (Basic Metabolic Panel-BMET) (80048-METABOL) TLB-A1C / Hgb A1C (Glycohemoglobin) (83036-A1C) TLB-Lipid Panel (80061-LIPID)  Problem # 4:  HYPERLIPIDEMIA (ICD-272.4)  Her updated medication list for this problem includes:    Lovastatin 40 Mg Tabs (Lovastatin) .Marland Kitchen... Take 2 tablet by mouth once a day  Labs Reviewed: SGOT: 23 (06/24/2009)   SGPT: 17 (06/24/2009)   HDL:62.30 (06/24/2009), 56.90 (12/24/2008)  LDL:DEL (06/06/2008), DEL (06/08/2007)  Chol:225 (06/24/2009), 227 (12/24/2008)  Trig:90.0 (06/24/2009), 86.0 (12/24/2008) goal ldl < 70, but she hesitates to change to lipitor or crestor due to cost; will re-consider when lipitor is generic next visit  Problem # 5:  ANXIETY (ICD-300.00)  Her updated medication list for this problem includes:    Alprazolam 0.25 Mg Tabs (Alprazolam) .Marland Kitchen... Take 1 tablet by mouth twice a day as needed    Citalopram Hydrobromide 20 Mg Tabs (Citalopram hydrobromide) .Marland Kitchen... 1po once daily to incr the ssri to 20 mg  Problem # 6:  HYPERTENSION (ICD-401.9)  Her updated medication list for this problem includes:    Diltiazem Hcl Er Beads 360 Mg Xr24h-cap (Diltiazem hcl er beads) .Marland Kitchen... 1po once daily    Furosemide 40 Mg Tabs (Furosemide) .Marland Kitchen... 1 and 1/2  tablet by mouth once a day    Hydralazine Hcl 50 Mg Tabs (Hydralazine hcl) .Marland Kitchen... Take 1 tablet by mouth three times a day    Labetalol Hcl 300 Mg Tabs (Labetalol hcl) .Marland Kitchen... Take 1 tablet by mouth twice a day  BP today: 152/78 Prior BP: 178/90 (06/24/2009)  Labs Reviewed: K+: 4.5 (06/24/2009) Creat: : 1.1 (06/24/2009)   Chol: 225 (06/24/2009)   HDL: 62.30 (06/24/2009)   LDL: DEL (06/06/2008)   TG: 90.0 (06/24/2009) mild elev today, likely situational, ok to follow, continue same treatment   Complete Medication List: 1)  Alprazolam 0.25 Mg Tabs (Alprazolam) .... Take  1 tablet by mouth twice a day as needed 2)  Diltiazem Hcl Er Beads 360 Mg  Xr24h-cap (Diltiazem hcl er beads) .Marland Kitchen.. 1po once daily 3)  Furosemide 40 Mg Tabs (Furosemide) .Marland Kitchen.. 1 and 1/2  tablet by mouth once a day 4)  Glipizide Xl 5 Mg Tb24 (Glipizide) .Marland Kitchen.. 1 by mouth qam only 5)  Hydralazine Hcl 50 Mg Tabs (Hydralazine hcl) .... Take 1 tablet by mouth three times a day 6)  Labetalol Hcl 300 Mg Tabs (Labetalol hcl) .... Take 1 tablet by mouth twice a day 7)  Levothyroxine Sodium 25 Mcg Tabs (Levothyroxine sodium) .... Take 1 tablet by mouth once a day 8)  Lovastatin 40 Mg Tabs (Lovastatin) .... Take 2 tablet by mouth once a day 9)  Metformin Hcl 500 Mg Tabs (Metformin hcl) .... Take 1 tablet by mouth once a day 10)  Warfarin Sodium 5 Mg Tabs (Warfarin sodium) .... Take as directed by coumadin clinic. 11)  Citalopram Hydrobromide 20 Mg Tabs (Citalopram hydrobromide) .Marland Kitchen.. 1po once daily 12)  Tramadol Hcl 50 Mg Tabs (Tramadol hcl) .Marland Kitchen.. 1po q 6 hrs as needed for pain 13)  Vitamin D 1000 Unit Tabs (Cholecalciferol) .Marland Kitchen.. 1 by mouth once daily 14)  Metamucil 30.9 % Powd (Psyllium) .... As needed 15)  Hydrocodone-acetaminophen 5-325 Mg Tabs (Hydrocodone-acetaminophen) .Marland Kitchen.. 1 by mouth two times a day as needed uncontrolled pain  Patient Instructions: 1)  Please take all new medications as prescribed  - the hydrocodone is for the more severe pain as needed  2)  please only take one of the glipizide per day 3)  increase the citalopram to 20 mg per day 4)  Continue all previous medications as before this visit  5)  Please go to the Lab in the basement for your blood and/or urine tests today  6)  Please schedule a follow-up appointment in 6 months with  CPX labs and: 7)  HbgA1C prior to visit, ICD-9: 250.02 8)  Urine Microalbumin prior to visit, ICD-9: 9)  Please return sooner if needed 10)  Remember you will need to see cardiology before your knee surgury if you decide to do  this Prescriptions: HYDROCODONE-ACETAMINOPHEN 5-325 MG TABS (HYDROCODONE-ACETAMINOPHEN) 1 by mouth two times a day as needed uncontrolled pain  #60 x 5   Entered and Authorized by:   Corwin Levins MD   Signed by:   Corwin Levins MD on 12/23/2009   Method used:   Print then Give to Patient   RxID:   [id number] CITALOPRAM HYDROBROMIDE 20 MG TABS (CITALOPRAM HYDROBROMIDE) 1po once daily  #90 x 3   Entered and Authorized by:   Corwin Levins MD   Signed by:   Corwin Levins MD on 12/23/2009   Method used:   Print then Give to Patient   RxID:   [id number]

## 2010-07-28 NOTE — Medication Information (Signed)
Summary: rov/ewj  Anticoagulant Therapy  Managed by: Bethena Midget, RN, BSN Referring MD: Rollene Rotunda MD PCP: Corwin Levins MD Supervising MD: Graciela Husbands MD, Viviann Spare Indication 1: Atrial Fibrillation (ICD-427.31) Lab Used: [facility] Site: [facility] INR POC 1.9 INR RANGE 2 - 3  Dietary changes: yes       Details: appetite poor  Health status changes: yes       Details: Rt. knee pain, needs knee replacement  Bleeding/hemorrhagic complications: no    Recent/future hospitalizations: no    Any changes in medication regimen? no    Recent/future dental: no  Any missed doses?: no       Is patient compliant with meds? yes       Allergies: 1)  ! * Clonidine  Anticoagulation Management History:      The patient is taking warfarin and comes in today for a routine follow up visit.  Positive risk factors for bleeding include an age of 75 years or older and presence of serious comorbidities.  The bleeding index is 'intermediate risk'.  Positive CHADS2 values include History of HTN, Age > 69 years old, and History of Diabetes.  The start date was 12/02/2006.  Her last INR was 2.6.  Anticoagulation responsible provider: Graciela Husbands MD, Viviann Spare.  INR POC: 1.9.  Cuvette Lot#: [id number].  Exp: 10/2010.    Anticoagulation Management Assessment/Plan:      The patient's current anticoagulation dose is Warfarin sodium 5 mg  tabs: Take as directed by coumadin clinic..  The target INR is 2 - 3.  The next INR is due 10/14/2009.  Anticoagulation instructions were given to patient.  Results were reviewed/authorized by Bethena Midget, RN, BSN.  She was notified by Bethena Midget, RN, BSN.         Prior Anticoagulation Instructions: INR 2.1  Continue on same dosage 1.5 tablets daily except 1 tablet on Mondays, Wednesdays, and Fridays.  Recheck in 3 weeks.    Current Anticoagulation Instructions: INR 1.9 Change dose to 1 1/2 pills everyday except 1 pill on Mondays and Fridays. Recheck in 2 weeks.

## 2010-07-28 NOTE — Medication Information (Signed)
Summary: rov/eac  Anticoagulant Therapy  Managed by: Eda Keys, PharmD Referring MD: Rollene Rotunda MD PCP: Corwin Levins MD Supervising MD: Jens Som MD, Arlys John Indication 1: Atrial Fibrillation (ICD-427.31) Lab Used: [facility] Site: [facility] INR POC 2.9 INR RANGE 2 - 3  Dietary changes: no    Health status changes: no    Bleeding/hemorrhagic complications: no    Recent/future hospitalizations: no    Any changes in medication regimen? no    Recent/future dental: yes     Details: Pt to have multiple tooth extractions soon, appt not yet scheduled.  Patient says she will likely not have all the extractions done at one time.  I have asked pt to notify us when these are scheduled.    Any missed doses?: no       Is patient compliant with meds? yes       Allergies: 1)  ! * Clonidine  Anticoagulation Management History:      The patient is taking warfarin and comes in today for a routine follow up visit.  Positive risk factors for bleeding include an age of 75 years or older and presence of serious comorbidities.  The bleeding index is 'intermediate risk'.  Positive CHADS2 values include History of HTN, Age > 65 years old, and History of Diabetes.  The start date was 12/02/2006.  Her last INR was 2.6.  Anticoagulation responsible provider: Jens Som MD, Arlys John.  INR POC: 2.9.  Cuvette Lot#: [id number].  Exp: 01/2011.    Anticoagulation Management Assessment/Plan:      The patient's current anticoagulation dose is Warfarin sodium 5 mg  tabs: Take as directed by coumadin clinic..  The target INR is 2 - 3.  The next INR is due 12/16/2009.  Anticoagulation instructions were given to patient.  Results were reviewed/authorized by Eda Keys, PharmD.  She was notified by Eda Keys.         Prior Anticoagulation Instructions: INR 1.5  Take 2 tablets today.  Then return to normal dosing schedule of 1 tablet on Monday and Friday and 1.5 tablets all other days.  Return to  clinic in 2 weeks.    Current Anticoagulation Instructions: INR 2.9  Continue taking 1 tablet on Monday and Friday, and take 1.5 tablets all other days.  Return to clinic in 4 weeks.

## 2010-07-28 NOTE — Medication Information (Signed)
Summary: rov/eac  Anticoagulant Therapy  Managed by: Elaina Pattee, PharmD Referring MD: Rollene Rotunda MD PCP: Corwin Levins MD Supervising MD: Gala Romney MD, Reuel Boom Indication 1: Atrial Fibrillation (ICD-427.31) Lab Used: [facility] Site: [facility] INR POC 1.8 INR RANGE 2 - 3  Dietary changes: no    Health status changes: no    Bleeding/hemorrhagic complications: no    Recent/future hospitalizations: no    Any changes in medication regimen? no    Recent/future dental: yes     Details: Pt will have teeth pulled, but no appointment scheduled.  Any missed doses?: yes     Details: Pt mixed up dosing schedule one day.  Is patient compliant with meds? yes       Allergies: 1)  ! * Clonidine  Anticoagulation Management History:      The patient is taking warfarin and comes in today for a routine follow up visit.  Positive risk factors for bleeding include an age of 6 years or older and presence of serious comorbidities.  The bleeding index is 'intermediate risk'.  Positive CHADS2 values include History of HTN, Age > 66 years old, and History of Diabetes.  The start date was 12/02/2006.  Her last INR was 2.6.  Anticoagulation responsible provider: Raiven Belizaire MD, Reuel Boom.  INR POC: 1.8.  Cuvette Lot#: [id number].  Exp: 01/2011.    Anticoagulation Management Assessment/Plan:      The patient's current anticoagulation dose is Warfarin sodium 5 mg  tabs: Take as directed by coumadin clinic..  The target INR is 2 - 3.  The next INR is due 01/06/2010.  Anticoagulation instructions were given to patient.  Results were reviewed/authorized by Elaina Pattee, PharmD.  She was notified by Elaina Pattee, PharmD.         Prior Anticoagulation Instructions: INR 2.9  Continue taking 1 tablet on Monday and Friday, and take 1.5 tablets all other days.  Return to clinic in 4 weeks.    Current Anticoagulation Instructions: INR 1.8. Take 2 tablets today, then take 1.5 tablets daily except 1 tablet on  Mon and Fri. Recheck in 3 weeks.

## 2010-07-28 NOTE — Medication Information (Signed)
Summary: rov/cb  Anticoagulant Therapy  Managed by: Weston Brass, PharmD Referring MD: Rollene Rotunda MD PCP: Corwin Levins MD Supervising MD: Clifton James MD, Cristal Deer Indication 1: Atrial Fibrillation (ICD-427.31) Lab Used: [facility] Site: [facility] INR POC 3.2 INR RANGE 2 - 3  Dietary changes: yes       Details: not able to eat as much lately- no appetite.    Health status changes: no    Bleeding/hemorrhagic complications: no    Recent/future hospitalizations: no    Any changes in medication regimen? yes       Details: started hydrocodone/apap for severe pain and increased celexa to 20mg    Recent/future dental: no  Any missed doses?: no       Is patient compliant with meds? yes       Allergies: 1)  ! * Clonidine  Anticoagulation Management History:      The patient is taking warfarin and comes in today for a routine follow up visit.  Positive risk factors for bleeding include an age of 75 years or older and presence of serious comorbidities.  The bleeding index is 'intermediate risk'.  Positive CHADS2 values include History of HTN, Age > 74 years old, and History of Diabetes.  The start date was 12/02/2006.  Her last INR was 2.6.  Anticoagulation responsible provider: Clifton James MD, Cristal Deer.  INR POC: 3.2.  Cuvette Lot#: [id number].  Exp: 02/2011.    Anticoagulation Management Assessment/Plan:      The patient's current anticoagulation dose is Warfarin sodium 5 mg  tabs: Take as directed by coumadin clinic..  The target INR is 2 - 3.  The next INR is due 01/27/2010.  Anticoagulation instructions were given to patient.  Results were reviewed/authorized by Weston Brass, PharmD.  She was notified by Weston Brass PharmD.         Prior Anticoagulation Instructions: INR 1.8. Take 2 tablets today, then take 1.5 tablets daily except 1 tablet on Mon and Fri. Recheck in 3 weeks.  Current Anticoagulation Instructions: INR 3.2  Take 1 tablet today then continue same dose of 1 1/2  tablets every day except 1 tablet on Monday and Friday.  Recheck in 3 weeks.

## 2010-07-30 NOTE — Medication Information (Signed)
Summary: ccr. gd      Allergies Added:  Anticoagulant Therapy  Managed by: [name], PharmD, BCPS, CPP Referring MD: Rollene Rotunda MD PCP: Corwin Levins MD Supervising MD: Antoine Poche MD, Fayrene Fearing Indication 1: Atrial Fibrillation (ICD-427.31) Lab Used: [facility] Site: [facility] INR POC 1.4 INR RANGE 2 - 3  Dietary changes: no    Health status changes: no    Bleeding/hemorrhagic complications: no    Recent/future hospitalizations: no    Any changes in medication regimen? yes       Details: Stopped tramadol and glipizide in August. Added Vicodin for hip pain.   Recent/future dental: no  Any missed doses?: yes       Is patient compliant with meds? no     Details: Forgets to take Coumadin about every other day.   Comments: admitts to coumadin non-compliance - stresed importance of daily coumadin - switch dose tyo AM for remembering tab with metformin dose she is compliant with - wil not change dose due to this  Current Medications (verified): 1)  Alprazolam 0.25 Mg Tabs (Alprazolam) .... Take 1 Tablet By Mouth Twice A Day As Needed 2)  Diltiazem Hcl Er Beads 360 Mg Xr24h-Cap (Diltiazem Hcl Er Beads) .Marland Kitchen.. 1po Once Daily 3)  Furosemide 40 Mg Tabs (Furosemide) .Marland Kitchen.. 1 and 1/2  Tablet By Mouth Once A Day 4)  Hydralazine Hcl 50 Mg Tabs (Hydralazine Hcl) .... Take 1 Tablet By Mouth Three Times A Day 5)  Labetalol Hcl 300 Mg Tabs (Labetalol Hcl) .... Take 1 Tablet By Mouth Twice A Day 6)  Levothyroxine Sodium 25 Mcg Tabs (Levothyroxine Sodium) .... Take 1 Tablet By Mouth Once A Day 7)  Lovastatin 40 Mg Tabs (Lovastatin) .... Take 2 Tablet By Mouth Once A Day 8)  Metformin Hcl 500 Mg Tabs (Metformin Hcl) .... Take 1 Tablet By Mouth Once A Day 9)  Warfarin Sodium 5 Mg  Tabs (Warfarin Sodium) .... Take As Directed By Coumadin Clinic. 10)  Citalopram Hydrobromide 20 Mg Tabs (Citalopram Hydrobromide) .Marland Kitchen.. 1po Once Daily 11)  Vitamin D 1000 Unit Tabs (Cholecalciferol) .Marland Kitchen.. 1 By Mouth  Once Daily 12)  Metamucil 30.9 % Powd (Psyllium) .... As Needed 13)  Hydrocodone-Acetaminophen 5-325 Mg Tabs (Hydrocodone-Acetaminophen) .Marland Kitchen.. 1 By Mouth 3 Times Per Day As Needed Uncontrolled Pain 14)  Nitrofurantoin Macrocrystal 100 Mg Caps (Nitrofurantoin Macrocrystal) .Marland Kitchen.. 1po Two Times A Day  Allergies (verified): 1)  ! * Clonidine  Anticoagulation Management History:      The patient is taking warfarin and comes in today for a routine follow up visit.  Positive risk factors for bleeding include an age of 75 years or older and presence of serious comorbidities.  The bleeding index is 'intermediate risk'.  Positive CHADS2 values include History of HTN, Age > 55 years old, and History of Diabetes.  The start date was 12/02/2006.  Her last INR was 2.6.  Anticoagulation responsible provider: Antoine Poche MD, Fayrene Fearing.  INR POC: 1.4.  Cuvette Lot#: [id number].  Exp: 07/2011.    Anticoagulation Management Assessment/Plan:      The patient's current anticoagulation dose is Warfarin sodium 5 mg  tabs: Take as directed by coumadin clinic..  The target INR is 2 - 3.  The next INR is due 07/15/2010.  Anticoagulation instructions were given to patient.  Results were reviewed/authorized by [name], PharmD, BCPS, CPP.  She was notified by Stephannie Peters, PharmD Candidate .         Prior Anticoagulation Instructions: INR  1.7  Take 2 tablets today, then resume same dosage 1.5 tablets daily except 1 tablet on Mondays and Fridays.  Recheck in 3 weeks.    Current Anticoagulation Instructions: INR 1.4   Coumadin 5 mg tablet - Continue 1.5 tablet daily except 1 tablet on Mondays and Fridays.

## 2010-07-30 NOTE — Medication Information (Signed)
Summary: ROV/LC      Allergies Added:  Anticoagulant Therapy  Managed by: [name], PharmD, BCPS, CPP Referring MD: Rollene Rotunda MD PCP: Corwin Levins MD Supervising MD: Graciela Husbands MD, Viviann Spare Indication 1: Atrial Fibrillation (ICD-427.31) Lab Used: [facility] Site: [facility] INR POC 5.1 INR RANGE 2 - 3  Dietary changes: no    Health status changes: no    Bleeding/hemorrhagic complications: no    Recent/future hospitalizations: yes       Details: colonoscopy sometime in near future  Any changes in medication regimen? no    Recent/future dental: yes     Details: teeth being pulled in near future - DDS wants her off Coumadin   Any missed doses?: no       Is patient compliant with meds? yes      Comments: Has taken her Coumadin that last 2 weeks remembering dose now that moved to morning meds!!! Does not want to come in 2 weeks - will come in 3 weeks  Current Medications (verified): 1)  Alprazolam 0.25 Mg Tabs (Alprazolam) .... Take 1 Tablet By Mouth Twice A Day As Needed 2)  Diltiazem Hcl Er Beads 360 Mg Xr24h-Cap (Diltiazem Hcl Er Beads) .Marland Kitchen.. 1po Once Daily 3)  Furosemide 40 Mg Tabs (Furosemide) .Marland Kitchen.. 1 and 1/2  Tablet By Mouth Once A Day 4)  Hydralazine Hcl 50 Mg Tabs (Hydralazine Hcl) .... Take 1 Tablet By Mouth Three Times A Day 5)  Labetalol Hcl 300 Mg Tabs (Labetalol Hcl) .... Take 1 Tablet By Mouth Twice A Day 6)  Levothyroxine Sodium 25 Mcg Tabs (Levothyroxine Sodium) .... Take 1 Tablet By Mouth Once A Day 7)  Lovastatin 40 Mg Tabs (Lovastatin) .... Take 2 Tablet By Mouth Once A Day 8)  Metformin Hcl 500 Mg Tabs (Metformin Hcl) .... Take 1 Tablet By Mouth Once A Day 9)  Warfarin Sodium 5 Mg  Tabs (Warfarin Sodium) .... Take As Directed By Coumadin Clinic. 10)  Citalopram Hydrobromide 20 Mg Tabs (Citalopram Hydrobromide) .Marland Kitchen.. 1po Once Daily 11)  Vitamin D 1000 Unit Tabs (Cholecalciferol) .Marland Kitchen.. 1 By Mouth Once Daily 12)  Metamucil 30.9 % Powd (Psyllium) .... As  Needed 13)  Hydrocodone-Acetaminophen 5-325 Mg Tabs (Hydrocodone-Acetaminophen) .Marland Kitchen.. 1 By Mouth 3 Times Per Day As Needed Uncontrolled Pain 14)  Nitrofurantoin Macrocrystal 100 Mg Caps (Nitrofurantoin Macrocrystal) .Marland Kitchen.. 1po Two Times A Day  Allergies (verified): 1)  ! * Clonidine  Anticoagulation Management History:      The patient is taking warfarin and comes in today for a routine follow up visit.  Positive risk factors for bleeding include an age of 75 years or older and presence of serious comorbidities.  The bleeding index is 'intermediate risk'.  Positive CHADS2 values include History of HTN, Age > 75 years old, and History of Diabetes.  The start date was 12/02/2006.  Her last INR was 2.6.  Anticoagulation responsible provider: Graciela Husbands MD, Viviann Spare.  INR POC: 5.1.  Cuvette Lot#: [id number].  Exp: 07/2011.    Anticoagulation Management Assessment/Plan:      The patient's current anticoagulation dose is Warfarin sodium 5 mg  tabs: Take as directed by coumadin clinic..  The target INR is 2 - 3.  The next INR is due 08/05/2010.  Anticoagulation instructions were given to patient.  Results were reviewed/authorized by [name], PharmD, BCPS, CPP.         Prior Anticoagulation Instructions: INR 1.4   Coumadin 5 mg tablet - Continue 1.5 tablet daily except  1 tablet on Mondays and Fridays.   Current Anticoagulation Instructions: INR 5.1  No Coumadin THUR and FRI 1/19 and 1/20 ONLY Couamdin 5mg  tabs - 1 TAB EACH DAY starting Sat 1/21

## 2010-07-30 NOTE — Progress Notes (Signed)
Summary: UTI symptoms  Phone Note Outgoing Call   Call placed by: Robin Call placed to: Patient Summary of Call: Called the patient and she is not having any UTI symptoms. She is having alot of pain, but thinks it is related to Arthritis. She did say she is having pain on her left side and not drinking any water at all, mostly soft drinks. Initial call taken by: Robin Ewing CMA Duncan Dull),  June 12, 2010 3:49 PM  Follow-up for Phone Call        given the pain, and the UA which show quite a bit of elev WBC's, she should likely be treated  done per emr Follow-up by: Corwin Levins MD,  June 12, 2010 4:05 PM  Additional Follow-up for Phone Call Additional follow up Details #1::        called informed prescription sent to her pharmacy. Additional Follow-up by: Robin Ewing CMA (AAMA),  June 12, 2010 4:11 PM    New/Updated Medications: NITROFURANTOIN MACROCRYSTAL 100 MG CAPS (NITROFURANTOIN MACROCRYSTAL) 1po two times a day Prescriptions: NITROFURANTOIN MACROCRYSTAL 100 MG CAPS (NITROFURANTOIN MACROCRYSTAL) 1po two times a day  #20 x 0   Entered and Authorized by:   Corwin Levins MD   Signed by:   Corwin Levins MD on 06/12/2010   Method used:   Electronically to        [facility] [phone]* (retail)       [address]       [place], [place]  [address]       Ph: [phone] or [phone]       Fax: [phone]   RxID:   [id number]

## 2010-07-30 NOTE — Assessment & Plan Note (Signed)
Summary: 6 MO ROV /NWS   Vital Signs:  Patient profile:   77 year old female Height:      65 inches Weight:      168.25 pounds BMI:     28.10 O2 Sat:      97 % on Room air Temp:     98.1 degrees F oral Pulse rate:   95 / minute BP sitting:   154 / 78  (left arm) Cuff size:   regular  Vitals Entered By: Zella Ball Ewing CMA Duncan Dull) (June 23, 2010 9:58 AM)  O2 Flow:  Room air  Preventive Care Screening     declines bone density at this time  CC: 6 month ROV/RE   Primary Care Provider:  Corwin Levins MD  CC:  6 month ROV/RE.  History of Present Illness: here for wellness, since last seen she had to go to ER sept 16 and dx with " arthriitis"  though xray show NO DJD of the left hip, tx with 15 pills oxycodone which helped;  since then the pain has improved though still as bad first thing in the AM;  also with known severe right knee DJD and marked and sometimes symptomatic lumbar disc djd;   also with worse recurring swelling (no pain, redness) to the right leg below the knee assoc with more knee swelling, both of which seem to go down at night, then recurs again;  Pt denies CP, worsening sob, doe, wheezing, orthopnea, pnd, palps, dizziness or syncope .  Pt denies new neuro symptoms such as headache, facial or extremity weakness Pt denies polydipsia, polyuria.   Overall good compliance with meds, trying to follow low chol diet, wt stable, little excercise however .  Increased citalopram last visit has helped with depressive symtpoms.  no overt bleeding or bruising on current warfarin.  Has not been back to the coumadin clinic since august.   Last saw Dr Darrelyn Hillock for the right knee approx eariler this yr, and he recommended surgury but she has deferred for now.  Pain now about at least 6-8/10 on dialy basis.  Walking with neices walker today for stablity.  Last fall aug 2011 - no injury.   No fever, wt loss, night sweats, loss of appetite or other constitutional symptoms  Denies worsening  depressive symptoms, suicidal ideation, or panic.   Overall good compliance with meds, and good tolerability.  Pt states good ability with ADL's, low fall risk, home safety reviewed and adequate, no significant change in hearing or vision.    Preventive Screening-Counseling & Management      Drug Use:  no.    Problems Prior to Update: 1)  Uti  (ICD-599.0) 2)  Chronic Pain Syndrome  (ICD-338.4) 3)  Coumadin Therapy  (ICD-V58.61) 4)  Need Prophylactic Vaccination&inoculation Flu  (ICD-V04.81) 5)  Anxiety  (ICD-300.00) 6)  Knee Pain, Right, Chronic  (ICD-719.46) 7)  Osteoarthritis, Knee, Right  (ICD-715.96) 8)  Knee Pain, Right  (ICD-719.46) 9)  Pulmonary Hypertension  (ICD-416.8) 10)  Fatigue  (ICD-780.79) 11)  Preventive Health Care  (ICD-V70.0) 12)  Peripheral Edema  (ICD-782.3) 13)  Preventive Health Care  (ICD-V70.0) 14)  Fibrillation, Atrial  (ICD-427.31) 15)  Low Back Pain  (ICD-724.2) 16)  Osteoarthritis  (ICD-715.90) 17)  Diverticulosis, Colon  (ICD-562.10) 18)  Colonic Polyps, Hx of  (ICD-V12.72) 19)  Fibroids, Uterus  (ICD-218.9) 20)  Peptic Ulcer Disease  (ICD-533.90) 21)  Hypertension  (ICD-401.9) 22)  Hyperlipidemia  (ICD-272.4) 23)  Diabetes Mellitus, Type II  (  ICD-250.00) 24)  Depression  (ICD-311) 25)  Anemia-iron Deficiency  (ICD-280.9) 26)  Obesity  (ICD-278.00)  Medications Prior to Update: 1)  Alprazolam 0.25 Mg Tabs (Alprazolam) .... Take 1 Tablet By Mouth Twice A Day As Needed 2)  Diltiazem Hcl Er Beads 360 Mg Xr24h-Cap (Diltiazem Hcl Er Beads) .Marland Kitchen.. 1po Once Daily 3)  Furosemide 40 Mg Tabs (Furosemide) .Marland Kitchen.. 1 and 1/2  Tablet By Mouth Once A Day 4)  Glipizide Xl 5 Mg  Tb24 (Glipizide) .Marland Kitchen.. 1 By Mouth Qam Only 5)  Hydralazine Hcl 50 Mg Tabs (Hydralazine Hcl) .... Take 1 Tablet By Mouth Three Times A Day 6)  Labetalol Hcl 300 Mg Tabs (Labetalol Hcl) .... Take 1 Tablet By Mouth Twice A Day 7)  Levothyroxine Sodium 25 Mcg Tabs (Levothyroxine Sodium) .... Take 1  Tablet By Mouth Once A Day 8)  Lovastatin 40 Mg Tabs (Lovastatin) .... Take 2 Tablet By Mouth Once A Day 9)  Metformin Hcl 500 Mg Tabs (Metformin Hcl) .... Take 1 Tablet By Mouth Once A Day 10)  Warfarin Sodium 5 Mg  Tabs (Warfarin Sodium) .... Take As Directed By Coumadin Clinic. 11)  Citalopram Hydrobromide 20 Mg Tabs (Citalopram Hydrobromide) .Marland Kitchen.. 1po Once Daily 12)  Tramadol Hcl 50 Mg Tabs (Tramadol Hcl) .Marland Kitchen.. 1po Q 6 Hrs As Needed For Pain 13)  Vitamin D 1000 Unit Tabs (Cholecalciferol) .Marland Kitchen.. 1 By Mouth Once Daily 14)  Metamucil 30.9 % Powd (Psyllium) .... As Needed 15)  Hydrocodone-Acetaminophen 5-325 Mg Tabs (Hydrocodone-Acetaminophen) .Marland Kitchen.. 1 By Mouth Two Times A Day As Needed Uncontrolled Pain 16)  Nitrofurantoin Macrocrystal 100 Mg Caps (Nitrofurantoin Macrocrystal) .Marland Kitchen.. 1po Two Times A Day  Current Medications (verified): 1)  Alprazolam 0.25 Mg Tabs (Alprazolam) .... Take 1 Tablet By Mouth Twice A Day As Needed 2)  Diltiazem Hcl Er Beads 360 Mg Xr24h-Cap (Diltiazem Hcl Er Beads) .Marland Kitchen.. 1po Once Daily 3)  Furosemide 40 Mg Tabs (Furosemide) .Marland Kitchen.. 1 and 1/2  Tablet By Mouth Once A Day 4)  Hydralazine Hcl 50 Mg Tabs (Hydralazine Hcl) .... Take 1 Tablet By Mouth Three Times A Day 5)  Labetalol Hcl 300 Mg Tabs (Labetalol Hcl) .... Take 1 Tablet By Mouth Twice A Day 6)  Levothyroxine Sodium 25 Mcg Tabs (Levothyroxine Sodium) .... Take 1 Tablet By Mouth Once A Day 7)  Lovastatin 40 Mg Tabs (Lovastatin) .... Take 2 Tablet By Mouth Once A Day 8)  Metformin Hcl 500 Mg Tabs (Metformin Hcl) .... Take 1 Tablet By Mouth Once A Day 9)  Warfarin Sodium 5 Mg  Tabs (Warfarin Sodium) .... Take As Directed By Coumadin Clinic. 10)  Citalopram Hydrobromide 20 Mg Tabs (Citalopram Hydrobromide) .Marland Kitchen.. 1po Once Daily 11)  Vitamin D 1000 Unit Tabs (Cholecalciferol) .Marland Kitchen.. 1 By Mouth Once Daily 12)  Metamucil 30.9 % Powd (Psyllium) .... As Needed 13)  Hydrocodone-Acetaminophen 5-325 Mg Tabs (Hydrocodone-Acetaminophen)  .Marland Kitchen.. 1 By Mouth 3 Times Per Day As Needed Uncontrolled Pain 14)  Nitrofurantoin Macrocrystal 100 Mg Caps (Nitrofurantoin Macrocrystal) .Marland Kitchen.. 1po Two Times A Day  Allergies (verified): 1)  ! 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 Drug use-no  Risk Factors: Smoking Status: quit (06/08/2007)  Social History: Former Smoker Alcohol use-no 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         all otherwise negative per pt -    Physical Exam  General:  alert and overweight-appearing.   Head:  normocephalic and atraumatic.   Eyes:  vision grossly intact, pupils equal, and pupils round.   Ears:  R ear normal and L ear normal.   Nose:  no external deformity and no nasal discharge.   Mouth:  no gingival abnormalities and pharynx pink and moist.   Neck:  supple and no masses.   Lungs:  normal respiratory effort and normal breath sounds.     Heart:  normal rate and regular rhythm.   Abdomen:  soft, non-tender, and normal bowel sounds.   Msk:  right knee marked crepitus, no effusion;  spine and paravertebral spine nontender Extremities:  no edema, no erythema , though she states late in the day she has ankle swelling that goes away at night Neurologic:  cranial nerves II-XII intact and strength normal in all extremities.   Skin:  color normal and no rashes.   Psych:  not depressed appearing and slightly anxious.     Impression & Recommendations:  Problem # 1:  Preventive Health Care (ICD-V70.0) Overall doing well, age appropriate education and counseling updated, referral for preventive services and immunizations addressed, dietary counseling and smoking status adressed , most recent labs reviewed  I have personally reviewed and have noted 1.The patient's medical and social history 2.Their use of alcohol, tobacco or illicit drugs 3.Their current medications and supplements 4. Functional ability including ADL's, fall risk, home safety risk, hearing & visual impairment  5.Diet and physical activities 6.Evidence for depression or mood disorders The patients weight, height, BMI  have been recorded in the chart I have made referrals, counseling and provided education to the patient based review of the above   Problem # 2:  COUMADIN THERAPY (ICD-V58.61) has had mobility and transportation issues since august; has neice's walker today and neice driving today ([place] [place] before today;  needs to get back to coumadin clinic - will refer  Problem # 3:  CHRONIC PAIN SYNDROME (ICD-338.4) to d/w the tramadol, to cont the hydrocodone as needed, mostly the right knee and back pain  Problem # 4:  OSTEOARTHRITIS, KNEE, RIGHT (ICD-715.96)  The following medications were removed from the medication list:    Tramadol Hcl 50 Mg Tabs (Tramadol hcl) .Marland Kitchen... 1po q 6 hrs as needed for pain Her updated medication list for this  problem includes:    Hydrocodone-acetaminophen 5-325 Mg Tabs (Hydrocodone-acetaminophen) .Marland Kitchen... 1 by mouth 3 times per day as needed uncontrolled pain needs to get back to Dr Darrelyn Hillock - may be good candidate for cortisone - will refer , cont walker for now  Orders: Orthopedic Surgeon Referral (Ortho Surgeon)  Problem # 5:  UTI (ICD-599.0)  Her updated medication list for this problem includes:    Nitrofurantoin Macrocrystal 100 Mg Caps (Nitrofurantoin macrocrystal) .Marland Kitchen... 1po  two times a day asympt - tx concluded, stable overall by hx and exam, ok to continue meds/tx as is   Problem # 6:  DIABETES MELLITUS, TYPE II (ICD-250.00)  The following medications were removed from the medication list:    Glipizide Xl 5 Mg Tb24 (Glipizide) .Marland Kitchen... 1 by mouth qam only Her updated medication list for this problem includes:    Metformin Hcl 500 Mg Tabs (Metformin hcl) .Marland Kitchen... Take 1 tablet by mouth once a day stable overall by hx and exam, ok to continue meds/tx as is  - to stop the glipizide for safety  Labs Reviewed: Creat: 1.1 (06/12/2010)    Reviewed HgBA1c results: 5.9 (06/12/2010)  6.3 (12/23/2009)  Problem # 7:  HYPERTENSION (ICD-401.9)  Her updated medication list for this problem includes:    Diltiazem Hcl Er Beads 360 Mg Xr24h-cap (Diltiazem hcl er beads) .Marland Kitchen... 1po once daily    Furosemide 40 Mg Tabs (Furosemide) .Marland Kitchen... 1 and 1/2  tablet by mouth once a day    Hydralazine Hcl 50 Mg Tabs (Hydralazine hcl) .Marland Kitchen... Take 1 tablet by mouth three times a day    Labetalol Hcl 300 Mg Tabs (Labetalol hcl) .Marland Kitchen... Take 1 tablet by mouth twice a day mild elev today, likely situational, ok to follow, continue same treatment   BP today: 154/78 Prior BP: 152/78 (12/23/2009)  Labs Reviewed: K+: 4.0 (06/12/2010) Creat: : 1.1 (06/12/2010)   Chol: 161 (06/12/2010)   HDL: 63.70 (06/12/2010)   LDL: 84 (06/12/2010)   TG: 66.0 (06/12/2010)  Complete Medication List: 1)  Alprazolam 0.25 Mg Tabs (Alprazolam)  .... Take 1 tablet by mouth twice a day as needed 2)  Diltiazem Hcl Er Beads 360 Mg Xr24h-cap (Diltiazem hcl er beads) .Marland Kitchen.. 1po once daily 3)  Furosemide 40 Mg Tabs (Furosemide) .Marland Kitchen.. 1 and 1/2  tablet by mouth once a day 4)  Hydralazine Hcl 50 Mg Tabs (Hydralazine hcl) .... Take 1 tablet by mouth three times a day 5)  Labetalol Hcl 300 Mg Tabs (Labetalol hcl) .... Take 1 tablet by mouth twice a day 6)  Levothyroxine Sodium 25 Mcg Tabs (Levothyroxine sodium) .... Take 1 tablet by mouth once a day 7)  Lovastatin 40 Mg Tabs (Lovastatin) .... Take 2 tablet by mouth once a day 8)  Metformin Hcl 500 Mg Tabs (Metformin hcl) .... Take 1 tablet by mouth once a day 9)  Warfarin Sodium 5 Mg Tabs (Warfarin sodium) .... Take as directed by coumadin clinic. 10)  Citalopram Hydrobromide 20 Mg Tabs (Citalopram hydrobromide) .Marland Kitchen.. 1po once daily 11)  Vitamin D 1000 Unit Tabs (Cholecalciferol) .Marland Kitchen.. 1 by mouth once daily 12)  Metamucil 30.9 % Powd (Psyllium) .... As needed 13)  Hydrocodone-acetaminophen 5-325 Mg Tabs (Hydrocodone-acetaminophen) .Marland Kitchen.. 1 by mouth 3 times per day as needed uncontrolled pain 14)  Nitrofurantoin Macrocrystal 100 Mg Caps (Nitrofurantoin macrocrystal) .Marland Kitchen.. 1po two times a day  Other Orders: Flu Vaccine 51yrs + MEDICARE PATIENTS (B1478) Administration Flu vaccine - MCR (G0008) Misc. Referral (Misc. Ref)  Patient Instructions: 1)  you had the flu shot today 2)  please call for your yearly mammogram 3)  You will be contacted about the referral(s) to: coumadin clinic, and Dr Darrelyn Hillock for the knee 4)  please stop the glipizide, and the tramadol 5)  Continue all previous medications as before this visit , including the metformin in the morning 6)  Please schedule a follow-up appointment in 6 months with: 7)  BMP prior to visit, ICD-9: 250.02 8)  Lipid Panel prior to visit, ICD-9: 9)  HbgA1C prior to visit, ICD-9: 10)  Your wt today was 168.4 lbs 11)  Your BP today was   Prescriptions: ALPRAZOLAM 0.25 MG TABS (ALPRAZOLAM) Take 1 tablet by mouth twice a day as needed  #60 x 3   Entered and Authorized by:   Corwin Levins MD   Signed by:   Corwin Levins MD on 06/23/2010   Method used:   Print then Give to Patient   RxID:   [id number] HYDROCODONE-ACETAMINOPHEN 5-325 MG TABS (HYDROCODONE-ACETAMINOPHEN) 1 by mouth 3 times per day as needed uncontrolled pain  #90 x 5   Entered and Authorized by:   Corwin Levins MD   Signed by:   Corwin Levins MD on 06/23/2010   Method used:   Print then Give to Patient   RxID:   [id number]    Orders Added: 1)  Flu Vaccine 1yrs + MEDICARE PATIENTS [Q2039] 2)  Administration Flu vaccine - MCR [G0008] 3)  Orthopedic Surgeon Referral [Ortho Surgeon] 4)  Misc. Referral [Misc. Ref] 5)  Est. Patient 65& > [56433]   Immunizations Administered:  Influenza Vaccine # 1:    Vaccine Type: Fluvax 3+    Site: left deltoid    Mfr: Sanofi Pasteur    Dose: 0.5 ml    Route: IM    Given by: Zella Ball Ewing CMA (AAMA)    Exp. Date: 12/26/2010    Lot #: [id number]    VIS given: 01/20/10 version given June 23, 2010.  Flu Vaccine Consent Questions:    Do you have a history of severe allergic reactions to this vaccine? no    Any prior history of allergic reactions to egg and/or gelatin? no    Do you have a sensitivity to the preservative Thimersol? no    Do you have a past history of Guillan-Barre Syndrome? no    Do you currently have an acute febrile illness? no    Have you ever had a severe reaction to latex? no    Vaccine information given and explained to patient? yes    Are you currently pregnant? no   Immunizations Administered:  Influenza Vaccine # 1:    Vaccine Type: Fluvax 3+    Site: left deltoid    Mfr: Sanofi Pasteur    Dose: 0.5 ml    Route: IM    Given by: Zella Ball Ewing CMA (AAMA)    Exp. Date: 12/26/2010    Lot #: [id number]    VIS given: 01/20/10 version given June 23, 2010.

## 2010-07-30 NOTE — Letter (Signed)
Summary: Colonoscopy Letter  [facility]  [address] [place], [place] [address]   Phone: [phone]  Fax: [phone]      July 09, 2010 MRN: [id number]   [facility] [address] [place], [place]  [address]   Dear Ms. Filo,   According to your medical record, it is time for you to schedule a Colonoscopy. The American Cancer Society recommends this procedure as a method to detect early colon cancer. Patients with a family history of colon cancer, or a personal history of colon polyps or inflammatory bowel disease are at increased risk.  This letter has been generated based on the recommendations made at the time of your procedure. If you feel that in your particular situation this may no longer apply, please contact our office.  Please call our office at [phone] to schedule this appointment or to update your records at your earliest convenience.  Thank you for cooperating with [place] to provide you with the very best care possible.   Sincerely,  Judie Petit T. Russella Dar, M.D.  [facility] [phone]

## 2010-08-05 ENCOUNTER — Encounter: Payer: Self-pay | Admitting: Internal Medicine

## 2010-08-05 ENCOUNTER — Encounter (INDEPENDENT_AMBULATORY_CARE_PROVIDER_SITE_OTHER): Payer: MEDICARE

## 2010-08-05 DIAGNOSIS — Z7901 Long term (current) use of anticoagulants: Secondary | ICD-10-CM

## 2010-08-05 DIAGNOSIS — I4891 Unspecified atrial fibrillation: Secondary | ICD-10-CM

## 2010-08-13 NOTE — Medication Information (Signed)
Summary: Coumadin Clinic  Anticoagulant Therapy  Managed by: Bethena Midget, RN, BSN Referring MD: Rollene Rotunda MD PCP: Corwin Levins MD Supervising MD: Graciela Husbands MD, Viviann Spare Indication 1: Atrial Fibrillation (ICD-427.31) Lab Used: [facility] Site: [facility] INR POC 3.4 INR RANGE 2 - 3  Dietary changes: no    Health status changes: yes       Details: Bil lower leg edema, Rt worse than lt.   Bleeding/hemorrhagic complications: no    Recent/future hospitalizations: no    Any changes in medication regimen? yes       Details: Hydrocodone PRN.  Recent/future dental: no  Any missed doses?: no       Is patient compliant with meds? yes      Comments: Pt refused 2 wk appt, aware of risk.  Allergies: 1)  ! * Clonidine  Anticoagulation Management History:      The patient is taking warfarin and comes in today for a routine follow up visit.  Positive risk factors for bleeding include an age of 75 years or older and presence of serious comorbidities.  The bleeding index is 'intermediate risk'.  Positive CHADS2 values include History of HTN, Age > 75 years old, and History of Diabetes.  The start date was 12/02/2006.  Her last INR was 2.6.  Anticoagulation responsible provider: Graciela Husbands MD, Viviann Spare.  INR POC: 3.4.  Cuvette Lot#: [id number].  Exp: 07/2011.    Anticoagulation Management Assessment/Plan:      The patient's current anticoagulation dose is Warfarin sodium 5 mg  tabs: Take as directed by coumadin clinic..  The target INR is 2 - 3.  The next INR is due 08/26/2010.  Anticoagulation instructions were given to patient.  Results were reviewed/authorized by Bethena Midget, RN, BSN.  She was notified by Bethena Midget, RN, BSN.         Prior Anticoagulation Instructions: INR 5.1  No Coumadin THUR and FRI 1/19 and 1/20 ONLY Couamdin 5mg  tabs - 1 TAB EACH DAY starting Sat 1/21   Current Anticoagulation Instructions: INR 3.4 Skip tomorrow's  dose then resume 1 pill everyday. Recheck in 3  weeks.

## 2010-08-24 ENCOUNTER — Encounter: Payer: Self-pay | Admitting: Cardiology

## 2010-08-24 DIAGNOSIS — I2789 Other specified pulmonary heart diseases: Secondary | ICD-10-CM

## 2010-08-24 DIAGNOSIS — I4891 Unspecified atrial fibrillation: Secondary | ICD-10-CM

## 2010-08-26 ENCOUNTER — Encounter: Payer: Self-pay | Admitting: Cardiovascular Disease

## 2010-08-26 ENCOUNTER — Encounter (INDEPENDENT_AMBULATORY_CARE_PROVIDER_SITE_OTHER): Payer: Medicare Other

## 2010-08-26 DIAGNOSIS — I4891 Unspecified atrial fibrillation: Secondary | ICD-10-CM

## 2010-08-26 DIAGNOSIS — Z7901 Long term (current) use of anticoagulants: Secondary | ICD-10-CM

## 2010-08-26 LAB — CONVERTED CEMR LAB: POC INR: 1.4

## 2010-09-03 ENCOUNTER — Encounter (INDEPENDENT_AMBULATORY_CARE_PROVIDER_SITE_OTHER): Payer: Medicare Other

## 2010-09-03 ENCOUNTER — Encounter: Payer: Self-pay | Admitting: Internal Medicine

## 2010-09-03 DIAGNOSIS — I4891 Unspecified atrial fibrillation: Secondary | ICD-10-CM

## 2010-09-03 DIAGNOSIS — Z7901 Long term (current) use of anticoagulants: Secondary | ICD-10-CM

## 2010-09-03 LAB — CONVERTED CEMR LAB: POC INR: 2.1

## 2010-09-03 NOTE — Medication Information (Signed)
Summary: rov/tm   Anticoagulant Therapy  Managed by: Windell Hummingbird, RN Referring MD: Rollene Rotunda MD PCP: Corwin Levins MD Supervising MD: Excell Seltzer MD, Casimiro Needle Indication 1: Atrial Fibrillation (ICD-427.31) Lab Used: [facility] Site: [facility] INR POC 1.4 INR RANGE 2 - 3  Dietary changes: no    Health status changes: no    Bleeding/hemorrhagic complications: no    Recent/future hospitalizations: no    Any changes in medication regimen? no    Recent/future dental: no  Any missed doses?: no       Is patient compliant with meds? yes       Allergies: 1)  ! * Clonidine  Anticoagulation Management History:      The patient is taking warfarin and comes in today for a routine follow up visit.  Positive risk factors for bleeding include an age of 75 years or older and presence of serious comorbidities.  The bleeding index is 'intermediate risk'.  Positive CHADS2 values include History of HTN, Age > 25 years old, and History of Diabetes.  The start date was 12/02/2006.  Her last INR was 2.6.  Anticoagulation responsible provider: Excell Seltzer MD, Casimiro Needle.  INR POC: 1.4.  Cuvette Lot#: [id number].  Exp: 06/2011.    Anticoagulation Management Assessment/Plan:      The patient's current anticoagulation dose is Warfarin sodium 5 mg  tabs: Take as directed by coumadin clinic..  The target INR is 2 - 3.  The next INR is due 09/03/2010.  Anticoagulation instructions were given to patient.  Results were reviewed/authorized by Windell Hummingbird, RN.  She was notified by Windell Hummingbird, RN.         Prior Anticoagulation Instructions: INR 3.4 Skip tomorrow's  dose then resume 1 pill everyday. Recheck in 3 weeks.   Current Anticoagulation Instructions: INR 1.4 Take 1 1/2 tablets today and tomorrow.  Then resume taking 1 tablet every day. Recheck in 1 week.

## 2010-09-08 NOTE — Medication Information (Signed)
Summary: rov/pc  Anticoagulant Therapy  Managed by: Cloyde Reams, RN, BSN Referring MD: Rollene Rotunda MD PCP: Corwin Levins MD Supervising MD: Ladona Ridgel MD, Sharlot Gowda Indication 1: Atrial Fibrillation (ICD-427.31) Lab Used: [facility] Site: [facility] INR POC 2.1 INR RANGE 2 - 3  Dietary changes: no    Health status changes: no    Bleeding/hemorrhagic complications: no    Recent/future hospitalizations: no    Any changes in medication regimen? no    Recent/future dental: no  Any missed doses?: no       Is patient compliant with meds? yes       Allergies: 1)  ! * Clonidine  Anticoagulation Management History:      The patient is taking warfarin and comes in today for a routine follow up visit.  Positive risk factors for bleeding include an age of 75 years or older and presence of serious comorbidities.  The bleeding index is 'intermediate risk'.  Positive CHADS2 values include History of HTN, Age > 45 years old, and History of Diabetes.  The start date was 12/02/2006.  Her last INR was 2.6.  Anticoagulation responsible provider: Ladona Ridgel MD, Sharlot Gowda.  INR POC: 2.1.  Cuvette Lot#: [id number].  Exp: 08/2011.    Anticoagulation Management Assessment/Plan:      The patient's current anticoagulation dose is Warfarin sodium 5 mg  tabs: Take as directed by coumadin clinic..  The target INR is 2 - 3.  The next INR is due 09/24/2010.  Anticoagulation instructions were given to patient.  Results were reviewed/authorized by Cloyde Reams, RN, BSN.  She was notified by Cloyde Reams RN.         Prior Anticoagulation Instructions: INR 1.4 Take 1 1/2 tablets today and tomorrow.  Then resume taking 1 tablet every day. Recheck in 1 week.   Current Anticoagulation Instructions: INR 2.1  Continue on same dosage 1 tablet daily.  Recheck in 2 weeks.

## 2010-09-24 ENCOUNTER — Encounter: Payer: Medicare Other | Admitting: *Deleted

## 2010-09-26 ENCOUNTER — Emergency Department (HOSPITAL_COMMUNITY): Payer: Medicare Other

## 2010-09-26 ENCOUNTER — Inpatient Hospital Stay (HOSPITAL_COMMUNITY)
Admission: EM | Admit: 2010-09-26 | Discharge: 2010-10-01 | DRG: 292 | Disposition: A | Discharge status: Home Health | Payer: Medicare Other | Attending: Internal Medicine | Admitting: Internal Medicine

## 2010-09-26 DIAGNOSIS — I5033 Acute on chronic diastolic (congestive) heart failure: Principal | ICD-10-CM | POA: Diagnosis present

## 2010-09-26 DIAGNOSIS — E119 Type 2 diabetes mellitus without complications: Secondary | ICD-10-CM | POA: Diagnosis present

## 2010-09-26 DIAGNOSIS — I4891 Unspecified atrial fibrillation: Secondary | ICD-10-CM | POA: Diagnosis present

## 2010-09-26 DIAGNOSIS — I509 Heart failure, unspecified: Secondary | ICD-10-CM | POA: Diagnosis present

## 2010-09-26 DIAGNOSIS — I279 Pulmonary heart disease, unspecified: Secondary | ICD-10-CM | POA: Diagnosis present

## 2010-09-26 DIAGNOSIS — E039 Hypothyroidism, unspecified: Secondary | ICD-10-CM | POA: Diagnosis present

## 2010-09-26 DIAGNOSIS — N179 Acute kidney failure, unspecified: Secondary | ICD-10-CM | POA: Diagnosis present

## 2010-09-26 DIAGNOSIS — N183 Chronic kidney disease, stage 3 unspecified: Secondary | ICD-10-CM | POA: Diagnosis present

## 2010-09-26 DIAGNOSIS — Z7901 Long term (current) use of anticoagulants: Secondary | ICD-10-CM

## 2010-09-26 DIAGNOSIS — I129 Hypertensive chronic kidney disease with stage 1 through stage 4 chronic kidney disease, or unspecified chronic kidney disease: Secondary | ICD-10-CM | POA: Diagnosis present

## 2010-09-26 LAB — DIFFERENTIAL
Basophils Relative: 1 % (ref 0–1)
Eosinophils Absolute: 0 10*3/uL (ref 0.0–0.7)
Monocytes Absolute: 0.3 10*3/uL (ref 0.1–1.0)
Monocytes Relative: 7 % (ref 3–12)

## 2010-09-26 LAB — POCT I-STAT, CHEM 8
BUN: 22 mg/dL (ref 6–23)
Calcium, Ion: 1.18 mmol/L (ref 1.12–1.32)
Creatinine, Ser: 1.4 mg/dL — ABNORMAL HIGH (ref 0.4–1.2)
TCO2: 26 mmol/L (ref 0–100)

## 2010-09-26 LAB — CBC
Hemoglobin: 9.9 g/dL — ABNORMAL LOW (ref 12.0–15.0)
MCH: 29.1 pg (ref 26.0–34.0)
MCHC: 32 g/dL (ref 30.0–36.0)
MCV: 90.9 fL (ref 78.0–100.0)
Platelets: 208 10*3/uL (ref 150–400)

## 2010-09-26 LAB — PROTIME-INR: Prothrombin Time: 41.1 seconds — ABNORMAL HIGH (ref 11.6–15.2)

## 2010-09-27 DIAGNOSIS — M7989 Other specified soft tissue disorders: Secondary | ICD-10-CM

## 2010-09-27 LAB — BASIC METABOLIC PANEL
BUN: 17 mg/dL (ref 6–23)
CO2: 26 mEq/L (ref 19–32)
CO2: 29 mEq/L (ref 19–32)
Calcium: 9.8 mg/dL (ref 8.4–10.5)
Chloride: 106 mEq/L (ref 96–112)
Chloride: 104 mEq/L (ref 96–112)
Glucose, Bld: 111 mg/dL — ABNORMAL HIGH (ref 70–99)
Glucose, Bld: 160 mg/dL — ABNORMAL HIGH (ref 70–99)
Potassium: 3.7 mEq/L (ref 3.5–5.1)
Sodium: 143 mEq/L (ref 135–145)
Sodium: 141 mEq/L (ref 135–145)

## 2010-09-27 LAB — GLUCOSE, CAPILLARY
Glucose-Capillary: 122 mg/dL — ABNORMAL HIGH (ref 70–99)
Glucose-Capillary: 104 mg/dL — ABNORMAL HIGH (ref 70–99)

## 2010-09-27 LAB — CARDIAC PANEL(CRET KIN+CKTOT+MB+TROPI)
CK, MB: 2.7 ng/mL (ref 0.3–4.0)
CK, MB: 2.4 ng/mL (ref 0.3–4.0)
Relative Index: 1.1 (ref 0.0–2.5)
Total CK: 241 U/L — ABNORMAL HIGH (ref 7–177)
Troponin I: 0.04 ng/mL (ref 0.00–0.06)

## 2010-09-27 LAB — TSH: TSH: 1.905 u[IU]/mL (ref 0.350–4.500)

## 2010-09-27 LAB — MAGNESIUM: Magnesium: 1.6 mg/dL (ref 1.5–2.5)

## 2010-09-28 DIAGNOSIS — I517 Cardiomegaly: Secondary | ICD-10-CM

## 2010-09-28 LAB — CBC
HCT: 29 % — ABNORMAL LOW (ref 36.0–46.0)
MCH: 29.6 pg (ref 26.0–34.0)
MCV: 90.3 fL (ref 78.0–100.0)
Platelets: 205 10*3/uL (ref 150–400)
RDW: 14.1 % (ref 11.5–15.5)

## 2010-09-28 LAB — BRAIN NATRIURETIC PEPTIDE: Pro B Natriuretic peptide (BNP): 685 pg/mL — ABNORMAL HIGH (ref 0.0–100.0)

## 2010-09-28 LAB — GLUCOSE, CAPILLARY
Glucose-Capillary: 133 mg/dL — ABNORMAL HIGH (ref 70–99)
Glucose-Capillary: 124 mg/dL — ABNORMAL HIGH (ref 70–99)

## 2010-09-28 LAB — CARDIAC PANEL(CRET KIN+CKTOT+MB+TROPI)
Total CK: 187 U/L — ABNORMAL HIGH (ref 7–177)
Troponin I: 0.03 ng/mL (ref 0.00–0.06)

## 2010-09-29 LAB — BASIC METABOLIC PANEL
BUN: 15 mg/dL (ref 6–23)
BUN: 20 mg/dL (ref 6–23)
CO2: 30 mEq/L (ref 19–32)
Calcium: 8.8 mg/dL (ref 8.4–10.5)
Calcium: 9.3 mg/dL (ref 8.4–10.5)
Creatinine, Ser: 1.3 mg/dL — ABNORMAL HIGH (ref 0.4–1.2)
GFR calc non Af Amer: 40 mL/min — ABNORMAL LOW (ref >60)
GFR calc non Af Amer: 34 mL/min — ABNORMAL LOW (ref >60)
Glucose, Bld: 101 mg/dL — ABNORMAL HIGH (ref 70–99)
Glucose, Bld: 148 mg/dL — ABNORMAL HIGH (ref 70–99)
Potassium: 3.5 mEq/L (ref 3.5–5.1)
Sodium: 139 mEq/L (ref 135–145)

## 2010-09-29 LAB — GLUCOSE, CAPILLARY: Glucose-Capillary: 117 mg/dL — ABNORMAL HIGH (ref 70–99)

## 2010-09-29 LAB — CBC
HCT: 32.6 % — ABNORMAL LOW (ref 36.0–46.0)
HCT: 33.6 % — ABNORMAL LOW (ref 36.0–46.0)
Hemoglobin: 10.5 g/dL — ABNORMAL LOW (ref 12.0–15.0)
MCH: 29.1 pg (ref 26.0–34.0)
MCHC: 32.2 g/dL (ref 30.0–36.0)
MCHC: 32.7 g/dL (ref 30.0–36.0)
MCV: 90.3 fL (ref 78.0–100.0)
Platelets: 253 10*3/uL (ref 150–400)
RDW: 14 % (ref 11.5–15.5)
RDW: 14 % (ref 11.5–15.5)
WBC: 4.8 10*3/uL (ref 4.0–10.5)

## 2010-09-30 LAB — GLUCOSE, CAPILLARY
Glucose-Capillary: 108 mg/dL — ABNORMAL HIGH (ref 70–99)
Glucose-Capillary: 137 mg/dL — ABNORMAL HIGH (ref 70–99)
Glucose-Capillary: 116 mg/dL — ABNORMAL HIGH (ref 70–99)

## 2010-09-30 LAB — PROTIME-INR: Prothrombin Time: 23.6 seconds — ABNORMAL HIGH (ref 11.6–15.2)

## 2010-10-01 LAB — GLUCOSE, CAPILLARY: Glucose-Capillary: 118 mg/dL — ABNORMAL HIGH (ref 70–99)

## 2010-10-01 LAB — BASIC METABOLIC PANEL
CO2: 29 mEq/L (ref 19–32)
Chloride: 102 mEq/L (ref 96–112)
GFR calc Af Amer: 46 mL/min — ABNORMAL LOW (ref >60)
Glucose, Bld: 109 mg/dL — ABNORMAL HIGH (ref 70–99)
Sodium: 139 mEq/L (ref 135–145)

## 2010-10-07 ENCOUNTER — Ambulatory Visit (INDEPENDENT_AMBULATORY_CARE_PROVIDER_SITE_OTHER): Payer: Medicare Other | Admitting: *Deleted

## 2010-10-07 DIAGNOSIS — I2789 Other specified pulmonary heart diseases: Secondary | ICD-10-CM

## 2010-10-07 DIAGNOSIS — I4891 Unspecified atrial fibrillation: Secondary | ICD-10-CM

## 2010-10-07 LAB — POCT INR: INR: 1.4

## 2010-10-07 NOTE — Patient Instructions (Signed)
INR 1.4  Coumadin 5mg  tabs Take 1 and 1/2 tab TODAY WED 4/11 The 1 tab each day Return in 2 weeks Tues April 24 at 10AM also seeing Tereso [facility] at [facility]

## 2010-10-07 NOTE — Progress Notes (Signed)
This will not let me signed it

## 2010-10-12 ENCOUNTER — Ambulatory Visit: Payer: Medicare Other | Admitting: Internal Medicine

## 2010-10-12 NOTE — Discharge Summary (Signed)
NAME:  [name], [name]                   ACCOUNT NO.:  [id number]  MEDICAL RECORD NO.:  [id number]           PATIENT TYPE:  I  LOCATION:  4735                         FACILITY:  [facility]  PHYSICIAN:  Marcellus Scott, MD     DATE OF BIRTH:  30-Jan-1932  DATE OF ADMISSION:  09/26/2010 DATE OF DISCHARGE:  10/01/2010                              DISCHARGE SUMMARY   PRIMARY CARE PHYSICIAN:  Corwin Levins, MD  CARDIOLOGIST:  Rollene Rotunda, MD, [facility]  DISCHARGE DIAGNOSES: 1. Acute-on-chronic right heart failure secondary to pulmonary     hypertension. 2. Bilateral leg edema secondary to acute-on-chronic right     heart failure secondary to pulmonary hypertension. 3. Hypertension. 4. Type 2 diabetes mellitus. 5. Prerenal azotemia/acute kidney injury secondary to diuresis,     improving. 6. Atrial fibrillation with controlled ventricular rate and on     Coumadin. 7. Mechanical fall. 8. Hypothyroidism.  DISCHARGE MEDICATIONS: 1. Cardizem CD 120 mg p.o. daily. 2. Labetalol increased to 450 mg p.o. b.i.d. 3. Coumadin reduced 2.5 mg p.o. daily. 4. Citalopram 20 mg p.o. daily. 5. Furosemide 60 mg p.o. daily. 6. Hydralazine 50 mg p.o. t.i.d. 7. Hydrocodone/acetaminophen 5/325, 1 tablet p.o. t.i.d. p.r.n. pain. 8. Levothroid 25 mcg p.o. daily. 9. Lovastatin 40 mg tablet, 2 tablets p.o. daily. 10.Metamucil 1 packet p.o. daily p.r.n. constipation. 11.Metformin 500 mg p.o. daily. 12.Nitrofurantoin microcrystal 100 mg p.o. b.i.d. 13.Vitamin D3 1000 units over the counter p.o. daily. 14.Xanax 0.25 mg p.o. b.i.d. p.r.n. for anxiety.  DISCONTINUED MEDICATION:  Diltiazem CD 360 mg.  IMAGING: 1. Chest x-ray March 31, impression:     a.     Cardiac enlargement without vascular congestion or pulmonary      edema.     b.     Small right pleural effusion. 2. Two-D echocardiogram:  Left ventricle size was normal.  There is     moderate left ventricular hypertrophy.  Systolic function was     normal.   Left ventricular ejection fraction 55-60%.  Left atrium     mildly dilated.  Right ventricle cavity was mildly dilated.  Right     atrium mildly dilated.  Pulmonary artery peak pressure is 51 mmHg.  LABORATORY DATA:  Basic metabolic panel today significant for BUN 23, creatinine 1.36, INR is 1.48.  CBC:  Hemoglobin 11, hematocrit 34, white blood cell 4.8, platelets 253.  BNP was 685.  Cardiac enzymes were cycled and negative.  Magnesium 1.6, TSH 1.905.  Admitting INR was 4.29.  CONSULTATIONS:  None.  DIET:  Diabetic and heart-healthy diet.  ACTIVITIES:  Increase activity gradually and will use home health PT and rolling walker.  COMPLAINTS TODAY:  None.  PHYSICAL EXAMINATION:  GENERAL:  The patient is in no obvious distress. Telemetry shows atrial fibrillation in the 80s. VITAL SIGNS:  Temperature 98.7 degrees Fahrenheit, pulse 88 per minute, irregular, respiration 20 per minute, blood pressure 137/78 mmHg and saturating at 100% on room air.  CBGs range from 115-143 mg/dL. RESPIRATORY:  Clear.  No increased work of breathing. CARDIOVASCULAR:  First and second heart sounds heard, irregular.  No JVD. ABDOMEN:  Soft, nondistended, bowel sounds present. CENTRAL NERVOUS SYSTEM:  The patient is awake, alert, oriented x3 with no focal neurological deficits. EXTREMITIES:  With bilateral lower extremity 3+ pitting edema.  The patient has bilateral lower extremity thigh high stockings.  Grade 5/5 power in all limbs.  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 She has been on her usual doses of Lasix.  She denies any dyspnea or chest pain.  She sustained a mechanical fall when she was  trying to get into bed.  She was admitted for further evaluation and management. 1. Acute-on-chronic right-sided congestive heart failure secondary to     moderate pulmonary hypertension.  The patient was admitted to the     hospital.  It was presumed that her lower extremity swelling was     partly because of her right heart failure, also contributed to by     calcium channel blockers and hydralazine.  Her diltiazem was     initially stopped.  Her labetalol was increased and she was     switched to IV increased dose of Lasix.  With these measures, the     patient' continued to have lower extremity edema, but because of     the diuresis, her creatinine started rising.  I discussed with her     primary care cardiologist yesterday who recommended the best     measure for her would be to provide bilateral lower extremity     compression stockings, leave her on her home dose of Lasix, reduce     the Cardizem dose to the lowest possible to minimize the lower     extremity edema.  She will continue on her previous dose of Lasix     and the Cardizem dose has been reduced.  She has compression     stockings which she is advised to use when she is up and about and     not sleeping.  She verbalizes understanding.  She can be followed     up as an outpatient by her cardiologist. 2. Bilateral lower extremity edema.  Management as per problem #1. 3. Prerenal azotemia/acute kidney injury secondary to diuresis.  Her     diuretics were temporarily held and her creatinines are improving.     Recommend followup of her basic metabolic panel as an outpatient. 4. Hypertension.  Reasonably controlled. 5. Type 2 diabetes mellitus, reasonably controlled. 6. Hypothyroidism.  TSH is normal. 7. Mechanical fall.  Physical Therapy, Occupational Therapy evaluated     and recommended home health PT and rolling walker, which is being     arranged. 8. Disposition.  The patient is discharged home in stable condition.      The patient lives with her niece who is able to provide supervision     and care.  FOLLOWUP RECOMMENDATIONS: 1. With Dr. Oliver Barre.  The patient is to call for an appointment to     be seen in the next 5-7 days from discharge. 2. With Dr. Rollene Rotunda.  The patient is to call for an     appointment. 3. With the [facility] on Monday, April 9, with repeat     basic metabolic panel and INR.  Time taken  in coordinating this discharge is 45 minutes.     Marcellus Scott, MD     AH/MEDQ  D:  10/01/2010  T:  10/02/2010  Job:  [id number]  cc:   Corwin Levins, MD Rollene Rotunda, MD, [facility]  Electronically Signed by Marcellus Scott MD on 10/12/2010 10:13:30 PM

## 2010-10-15 ENCOUNTER — Encounter: Payer: Self-pay | Admitting: Internal Medicine

## 2010-10-15 ENCOUNTER — Ambulatory Visit (INDEPENDENT_AMBULATORY_CARE_PROVIDER_SITE_OTHER): Payer: Medicare Other | Admitting: Internal Medicine

## 2010-10-15 ENCOUNTER — Other Ambulatory Visit (INDEPENDENT_AMBULATORY_CARE_PROVIDER_SITE_OTHER): Payer: Medicare Other

## 2010-10-15 DIAGNOSIS — E785 Hyperlipidemia, unspecified: Secondary | ICD-10-CM

## 2010-10-15 DIAGNOSIS — E119 Type 2 diabetes mellitus without complications: Secondary | ICD-10-CM

## 2010-10-15 DIAGNOSIS — N289 Disorder of kidney and ureter, unspecified: Secondary | ICD-10-CM

## 2010-10-15 DIAGNOSIS — Z0000 Encounter for general adult medical examination without abnormal findings: Secondary | ICD-10-CM

## 2010-10-15 DIAGNOSIS — E039 Hypothyroidism, unspecified: Secondary | ICD-10-CM

## 2010-10-15 DIAGNOSIS — N183 Chronic kidney disease, stage 3 unspecified: Secondary | ICD-10-CM

## 2010-10-15 HISTORY — DX: Hypothyroidism, unspecified: E03.9

## 2010-10-15 HISTORY — DX: Chronic kidney disease, stage 3 unspecified: N18.30

## 2010-10-15 LAB — LIPID PANEL
Cholesterol: 157 mg/dL (ref 0–200)
LDL Cholesterol: 76 mg/dL (ref 0–99)
Triglycerides: 60 mg/dL (ref 0.0–149.0)
VLDL: 12 mg/dL (ref 0.0–40.0)

## 2010-10-15 LAB — BASIC METABOLIC PANEL
BUN: 26 mg/dL — ABNORMAL HIGH (ref 6–23)
Chloride: 102 mEq/L (ref 96–112)
Creatinine, Ser: 1.3 mg/dL — ABNORMAL HIGH (ref 0.4–1.2)
Glucose, Bld: 98 mg/dL (ref 70–99)

## 2010-10-15 LAB — HEMOGLOBIN A1C: Hgb A1c MFr Bld: 6.6 % — ABNORMAL HIGH (ref 4.6–6.5)

## 2010-10-15 LAB — TSH: TSH: 0.97 u[IU]/mL (ref 0.35–5.50)

## 2010-10-15 NOTE — Patient Instructions (Signed)
Continue all other medications as before Please go to LAB in the Basement for the blood and/or urine tests to be done today Please call the number on the Blue Card (the [employer] System) for results of testing in 2-3 days You are given the handicap form today Please keep your appointments with your specialists as you have planned Please return in 6 mo with Lab testing done 3-5 days before

## 2010-10-15 NOTE — Progress Notes (Signed)
Quick Note:  Voice message left on [employer] system - lab is negative, normal or otherwise stable, pt to continue same tx ______ 

## 2010-10-17 NOTE — H&P (Signed)
NAME:  [name], [name]                   ACCOUNT NO.:  [id number]  MEDICAL RECORD NO.:  [id number]           PATIENT TYPE:  E  LOCATION:  [facility]                         FACILITY:  [facility]  PHYSICIAN:  Seward Speck, MD        DATE OF BIRTH:  [birth year]  DATE OF ADMISSION:  09/26/2010 DATE OF DISCHARGE:                             HISTORY & PHYSICAL   PRIMARY CARE PHYSICIAN:  Corwin Levins, MD  CARDIOLOGIST:  The patient saw Dr. Antoine Poche back in November 2009.  CHIEF COMPLAINTS:  Lower extremity edema.  HISTORY OF PRESENT ILLNESS:  The patient is a 77 year old female who presents with the above complaint. She never had any hospitalizations in the past.  She noticed over the past several weeks that she has had worsening of lower extremity edema.  Her niece is her closest relative who is with her today confirmed that story.  The patient lives alone in a one-storey house.  She has been on her baseline level of activity, which is able to care for herself and perform her ADLs; however, her lower extremities have become progressively more swollen despite her taking her normal dose of Lasix.  She denies any dyspnea, PND, orthopnea, chest pain, palpitations, presyncope, or syncope.  She does not weigh herself regularly.  She has had some posterior calf pain as well.  No recent extended travel.  No periods of prolonged immobility. She has been taking all of her medicines, none of which have been changed recently.  The patient had had a fall on Monday when she was trying to get into bed.  She uses a walker at baseline and had what sounds like a mechanical fall.  She had no injuries from this.  She has not had any falls since.  She does not routinely fall.  The patient is on Coumadin, and her INR is supratherapeutic today, however, she denies any bleeding including no dark-colored stools or other signs of bleeding.  The patient's states she was using socks were a bit tighter recently because of the  edema.  She does not notice any edema elsewhere. Her appetite has been at baseline.  PAST MEDICAL HISTORY: 1. Atrial fibrillation described as persistent back in 2009. 2. Diabetes mellitus, on oral therapy. 3. Hypertension, longstanding. 4. Hypercholesterolemia. 5. Depression. 6. History of PUD. 7. Hypothyroidism, status post TAH. 8. History of mild-to-moderate pulmonary hypertension. 9. CKD, II to III.  FAMILY HISTORY:  Reviewed, noncontributory.  SOCIAL HISTORY:  The patient lives alone.  She is able to perform her ADLs.  She uses a walker at baseline.  Lives in a [place] house.  She used to smoke rarely long time ago, but has not smoked for decades, she is unable to quantify her history.  Denies any alcohol or illicit drug use.  She is retired.  ALLERGIES:  No known drug allergies.  CURRENT MEDICATIONS: 1. Alprazolam 0.25 mg twice daily as needed. 2. Diltiazem ER 360 mg daily. 3. Furosemide 60 mg daily. 4. Hydralazine 50 mg t.i.d. 5. Labetalol 300 mg twice daily. 6. Levothyroxine 25 mcg once daily. 7. Lovastatin 80  mg daily. 8. Metformin 500 mg daily. 9. Warfarin 5 mg daily. 10.Citalopram 20 mg daily. 11.Vitamin D 1000 units daily. 12.Metamucil/psyllium powder as needed. 13.Vicodin 5/325 t.i.d. as needed for pain.  CODE STATUS:  Full code.  REVIEW OF SYSTEMS:  A 12-point review of systems was reviewed and is as per HPI, otherwise negative.  PHYSICAL EXAMINATION:  VITAL SIGNS:  Blood pressure 100/46 upon arrival; by the time, I saw it was 162/81.  The patient had not had any significant intervention in meantime, pulse in the 50s, respiratory rate 18, afebrile, oxygen saturation is 98% on room air. GENERAL/CONSTITUTIONAL:  A well-developed elderly female in no acute distress. HEENT:  Mostly edentulous.  Moist mucous membranes. NECK:  Supple.  No lymphadenopathy.  The patient has pronounced jugular venous distention up to her earlobe. CARDIOVASCULAR:   Irregularity irregular but normal rate.  No murmurs. She has exaggerated splitting of S2.  No S3, no S4. PULMONARY:  Clear to auscultation bilaterally. ABDOMEN:  Soft, nontender, and nondistended. EXTREMITIES:  Warm and well-perfused.  She has normal pulses.  The patient has 3 to 4+ pitting edema from her feet up to midthigh. MUSCULOSKELETAL:  The patient has a valgus deformity of her right lower extremity. SKIN:  No rash. PSYCH:  Appropriate. NEUROLOGIC:  Alert and oriented x3.  Nonfocal.  DIAGNOSTIC STUDIES:  The patient had CBC that showed a hemoglobin of 11.2, her previous hemoglobin in 2011 was 10.8, and was in this range as far back as in 2008, MCV and MCH normal.  INR 4.29, last 3.04 in September.  She has probably had others in between, that are not in my system.  BMP shows a creatinine of 1.4, which is essentially her baseline.  BUN 22.  EKG shows atrial fibrillation with a right bundle- branch block.  I do not have any old EKGs to compare this to.  Chest x- ray shows borderline cardiomegaly with a smaller pleural effusion.  No pulmonary edema.  Echocardiogram from May 07, 2008, shows concentric LVH with a normal EF, moderate tricuspid regurgitation with mild-to-moderate elevated pulmonary artery systolic pressure.  ASSESSMENT: 1. Bilateral symmetric lower extremity edema. 2. Recent fall, likely mechanical in nature. 3. Chronic kidney disease, II to III, stable. 4. Atrial fibrillation, lease persistent likely permanent. 5. Coagulopathy/supratherapeutic INR in the setting of Coumadin use. 6. History of hypertension. 7. History of diabetes. 8. History of hypothyroidism.  PLAN:  The patient likely has acute exacerbation of diastolic congestive heart failure.  She is in atrial fibrillation and has LVH from longstanding hypertension and also loss of her atrial kick, will easily set her into congestive heart failure.  Certainly, she has never had this before, although  she has had chronic lower extremity noted in previous notes.  She states this is much worse than usual.  She clearly has right-sided heart failure.  We thought she does not have any pulmonary edema on exam or x-ray, although the most common cause of right heart failure in someone like her would be left heart failure specifically in her diastolic congestive heart failure.  We will check a BMP.  We will also give her an IV dose of Lasix 60 mg b.i.d., which is essentially doubling her home dosage.  I do not think she will need much diuresis.  We will check b.i.d. electrolytes while diuresing her.  I think that it is reasonable to check another TTE as it has been almost 3 years since her last one.  She may have pulmonary  hypertension as a cause of right heart failure causing her edema, and she certainly had some before.  If that is significantly worse, that will likely be the primary cause of her symptoms.  I also wonder if some of this is iatrogenic.  She is on two medications that can cause edema, hydralazine and diltiazem.  I do not know why she is on the diltiazem and labetalol. I assume they are both for rate control for her AFib and hypertension. Her pulse has been if anything low, I will probably discontinue the diltiazem and go up on the labetalol slightly to 450 mg b.i.d.   If there is any iatrogenic cause of her edema, we can hopefully improve that with some medication adjustments.  I hesitate to stop hydralazine at this time as well to avoid making too many changes at once, but this could be considered.  We will also check a TSH.  Put her on a low- sodium, low-fluid, heart-healthy diet.  We will check bilateral lower extremity ultrasounds to rule out DVT, although I doubt that.  We will have Pharmacy re-dose her Coumadin, which will likely involve holding it for a day or two.  We will have PT and OT see her given her recent fall. We will also check orthostatic vital signs, although  she does not describe symptoms of orthostasis.  We will check daily weights and we will also do a CHF education consult.          ______________________________ Seward Speck, MD     DP/MEDQ  D:  09/27/2010  T:  09/27/2010  Job:  [id number]  Electronically Signed by Seward Speck MD on 10/17/2010 12:18:03 PM

## 2010-10-19 DIAGNOSIS — E119 Type 2 diabetes mellitus without complications: Secondary | ICD-10-CM

## 2010-10-19 DIAGNOSIS — I509 Heart failure, unspecified: Secondary | ICD-10-CM

## 2010-10-19 DIAGNOSIS — I129 Hypertensive chronic kidney disease with stage 1 through stage 4 chronic kidney disease, or unspecified chronic kidney disease: Secondary | ICD-10-CM

## 2010-10-19 DIAGNOSIS — I2789 Other specified pulmonary heart diseases: Secondary | ICD-10-CM

## 2010-10-19 DIAGNOSIS — N183 Chronic kidney disease, stage 3 (moderate): Secondary | ICD-10-CM

## 2010-10-20 ENCOUNTER — Encounter: Payer: Self-pay | Admitting: Physician Assistant

## 2010-10-20 ENCOUNTER — Ambulatory Visit (INDEPENDENT_AMBULATORY_CARE_PROVIDER_SITE_OTHER): Payer: Medicare Other | Admitting: Physician Assistant

## 2010-10-20 ENCOUNTER — Ambulatory Visit (INDEPENDENT_AMBULATORY_CARE_PROVIDER_SITE_OTHER): Payer: Medicare Other | Admitting: *Deleted

## 2010-10-20 VITALS — BP 142/90 | HR 72 | Ht 66.0 in | Wt 162.0 lb

## 2010-10-20 DIAGNOSIS — I2789 Other specified pulmonary heart diseases: Secondary | ICD-10-CM

## 2010-10-20 DIAGNOSIS — I4891 Unspecified atrial fibrillation: Secondary | ICD-10-CM

## 2010-10-20 DIAGNOSIS — I509 Heart failure, unspecified: Secondary | ICD-10-CM

## 2010-10-20 DIAGNOSIS — I50810 Right heart failure, unspecified: Secondary | ICD-10-CM | POA: Insufficient documentation

## 2010-10-20 DIAGNOSIS — I10 Essential (primary) hypertension: Secondary | ICD-10-CM

## 2010-10-20 NOTE — Patient Instructions (Signed)
Your physician recommends that you schedule a follow-up appointment in: 2 MONTHS WITH DR. HOCHREIN AS PER SCOTT WEAVER, PA-C

## 2010-10-20 NOTE — Assessment & Plan Note (Signed)
LE edema well controlled.  Recent creatinine stable.  Continue current therapy.

## 2010-10-20 NOTE — Assessment & Plan Note (Signed)
Rate controlled.  Continue follow up on coumadin clinic.

## 2010-10-20 NOTE — Assessment & Plan Note (Signed)
Mild to moderate.  Continue current therapy.  At this point, not sure needs right heart cath, etc.  She is on coumadin for AFib.  Follow up with Dr. Antoine Poche in 2 months.

## 2010-10-20 NOTE — Assessment & Plan Note (Signed)
Elevated today.  Recently, BP has been optimal.  No change in therapy.  Continue to monitor.

## 2010-10-20 NOTE — Progress Notes (Signed)
History of Present Illness: Primary Cardiologist:  Dr. Rollene Rotunda  [name] is a 77 y.o. female with a h/o AFib, pulmonary HTN, DM2, HTN, HLP and CKD who was recently admitted to [facility] 3/31-4/5.  She presented with a fall but was noted to be in acute right sided heart failure.  Echo done 4/2: EF 55-60%, mod LVH, mild BAE, RVE and PASP 51.  Her BNP was 685.  It was felt that her R sided HF was secondary to pulmonary HTN (with some edema likely 2/2 CCB therapy).  She was diuresed and this was c/b worsening renal function with creatinine peaking at 1.5 (1.3 at d/c).  This required adjustment in her diuretics.  She was not seen by our service, but the notes indicate that the attending service did speak to Dr. Antoine Poche by phone and he recommended continued diuretics, compression hose and the lowest tolerable dose of diltiazem. She returns for follow up.  Labs: Na 144, K 4.3, creatinine 1.3, Hgb 11, LDL 76, TSH 0.97. CXR: CE, small right effusion, no vasc. Congestion  She is doing well.  Her legs are much improved.  The compression hose are somewhat tight.  I offered her thigh high stockings but she declined.  She denies significant dyspnea.  She walks with a walker and uses a wheelchair at times.  No chest pain or syncope.  No orthopnea or PND.  She had labs with her PCP last week and her creatinine was stable.    Past Medical History  Diagnosis Date  . Obesity   . Anemia, iron deficiency   . Depression   . Diabetes mellitus type II   . Hyperlipidemia   . Hypertension   . Peptic ulcer   . History of uterine fibroid   . Hx of colonic polyps   . Diverticula, colon   . Osteoarthritis   . Low back pain   . Atrial fibrillation     permanent, on coumadin (followed by [facility])  . Pulmonary hypertension     echo 4/12:  EF 55-60%, mod LVH, mild BAE, RVE and PASP 51  . Anxiety   . DIABETES MELLITUS, TYPE II 03/05/2007  . HYPERLIPIDEMIA 03/05/2007  . ANEMIA-IRON DEFICIENCY 03/05/2007  .  HYPERTENSION 03/05/2007  . DEPRESSION 03/05/2007  . LOW BACK PAIN 03/05/2007  . PERIPHERAL EDEMA 04/29/2008  . Chronic pain syndrome 06/23/2010  . FIBROIDS, UTERUS 03/05/2007  . PEPTIC ULCER DISEASE 03/05/2007  . DIVERTICULOSIS, COLON 03/05/2007  . OSTEOARTHRITIS 03/05/2007  . COLONIC POLYPS, HX OF 03/05/2007  . Hypothyroidism 10/15/2010  . Renal insufficiency 10/15/2010    Current Outpatient Prescriptions  Medication Sig Dispense Refill  . ALPRAZolam (XANAX) 0.25 MG tablet Take 0.25 mg by mouth 2 (two) times daily as needed.        . Cholecalciferol (VITAMIN D) 1000 UNITS capsule Take 1,000 Units by mouth daily.        . citalopram (CELEXA) 20 MG tablet Take 20 mg by mouth daily.        Marland Kitchen diltiazem (CARDIZEM CD) 120 MG 24 hr capsule Take 120 mg by mouth daily.        . furosemide (LASIX) 40 MG tablet Take 60 mg by mouth daily.        . hydrALAZINE (APRESOLINE) 50 MG tablet Take 50 mg by mouth 3 (three) times daily.        Marland Kitchen HYDROcodone-acetaminophen (NORCO) 5-325 MG per tablet Take 1 tablet by mouth every 8 (eight) hours as  needed.        . labetalol (NORMODYNE) 300 MG tablet Take 450 mg by mouth 2 (two) times daily.        Marland Kitchen levothyroxine (SYNTHROID, LEVOTHROID) 25 MCG tablet Take 25 mcg by mouth daily.        Marland Kitchen lovastatin (MEVACOR) 40 MG tablet Take 80 mg by mouth at bedtime.        . metFORMIN (GLUCOPHAGE-XR) 500 MG 24 hr tablet Take 500 mg by mouth daily with breakfast.        . warfarin (COUMADIN) 5 MG tablet Take by mouth as directed.          No Known Allergies  History  Substance Use Topics  . Smoking status: Former Games developer  . Smokeless tobacco: Not on file  . Alcohol Use: No    Vital Signs: BP 142/90  Pulse 72  Ht 5\' 6"  (1.676 m)  Wt 162 lb (73.483 kg)  BMI 26.15 kg/m2  PHYSICAL EXAM: GEN:  Elderly female in no acute distress HEENT: normal Neck: no JVD at 90 degrees Cardiac:  normal S1, S2; irreg irreg, no murmur Lungs:  clear to auscultation bilaterally, no wheezing, rhonchi  or rales Abd: soft, nontender Ext: trace bilat. Edema, compression hose intact Skin: warm and dry Neuro:  CNs 2-12 intact, no focal abnormalities noted  EKG:  AFib, HR 88, normal axis, RBBB, no significant changes  ASSESSMENT AND PLAN:

## 2010-10-25 ENCOUNTER — Encounter: Payer: Self-pay | Admitting: Internal Medicine

## 2010-10-25 NOTE — Assessment & Plan Note (Signed)
stable overall by hx and exam, most recent lab reviewed with pt, and pt to continue medical treatment as before, for f/u labs today

## 2010-10-25 NOTE — Assessment & Plan Note (Signed)
stable overall by hx and exam, most recent lab reviewed with pt, and pt to continue medical treatment as before  Lab Results  Component Value Date   HGBA1C 6.6* 10/15/2010

## 2010-10-25 NOTE — Assessment & Plan Note (Signed)
stable overall by hx and exam, most recent lab reviewed with pt, and pt to continue medical treatment as before  Lab Results  Component Value Date   TSH 0.97 10/15/2010

## 2010-10-25 NOTE — Progress Notes (Signed)
Subjective:    Patient ID: [name], female    DOB: 1932/04/11, 77 y.o.   MRN: [id number]  HPI  Here to f/u; overall doing ok,  Pt denies chest pain, increased sob or doe, wheezing, orthopnea, PND, increased LE swelling, palpitations, dizziness or syncope.  Pt denies new neurological symptoms such as new headache, or facial or extremity weakness or numbness   Pt denies polydipsia, polyuria, or low sugar symptoms such as weakness or confusion improved with po intake.  Pt states overall good compliance with meds, trying to follow lower cholesterol, diabetic diet, wt overall stable but little exercise however.   Pt denies fever, wt loss, night sweats, loss of appetite, or other constitutional symptoms  Overall good compliance with treatment, and good medicine tolerability.  Denies hyper or hypo thyroid symptoms such as voice, skin or hair change. Past Medical History  Diagnosis Date  . Obesity   . Anemia, iron deficiency   . Depression   . Diabetes mellitus type II   . Hyperlipidemia   . Hypertension   . Peptic ulcer   . History of uterine fibroid   . Hx of colonic polyps   . Diverticula, colon   . Osteoarthritis   . Low back pain   . Atrial fibrillation     permanent, on coumadin (followed by [facility]madin clinic)  . Pulmonary hypertension     echo 4/12:  EF 55-60%, mod LVH, mild BAE, RVE and PASP 51  . Anxiety   . DIABETES MELLITUS, TYPE II 03/05/2007  . HYPERLIPIDEMIA 03/05/2007  . ANEMIA-IRON DEFICIENCY 03/05/2007  . HYPERTENSION 03/05/2007  . DEPRESSION 03/05/2007  . LOW BACK PAIN 03/05/2007  . PERIPHERAL EDEMA 04/29/2008  . Chronic pain syndrome 06/23/2010  . FIBROIDS, UTERUS 03/05/2007  . PEPTIC ULCER DISEASE 03/05/2007  . DIVERTICULOSIS, COLON 03/05/2007  . OSTEOARTHRITIS 03/05/2007  . COLONIC POLYPS, HX OF 03/05/2007  . Hypothyroidism 10/15/2010  . Renal insufficiency 10/15/2010   Past Surgical History  Procedure Date  . Laparoscopic hysterectomy     reports that she has quit smoking. She  does not have any smokeless tobacco history on file. She reports that she does not drink alcohol or use illicit drugs. family history includes Hypertension in her sister. No Known Allergies Current Outpatient Prescriptions on File Prior to Visit  Medication Sig Dispense Refill  . ALPRAZolam (XANAX) 0.25 MG tablet Take 0.25 mg by mouth 2 (two) times daily as needed.        . Cholecalciferol (VITAMIN D) 1000 UNITS capsule Take 1,000 Units by mouth daily.        . citalopram (CELEXA) 20 MG tablet Take 20 mg by mouth daily.        Marland Kitchen diltiazem (CARDIZEM CD) 120 MG 24 hr capsule Take 120 mg by mouth daily.        . furosemide (LASIX) 40 MG tablet Take 60 mg by mouth daily.        . hydrALAZINE (APRESOLINE) 50 MG tablet Take 50 mg by mouth 3 (three) times daily.        Marland Kitchen HYDROcodone-acetaminophen (NORCO) 5-325 MG per tablet Take 1 tablet by mouth every 8 (eight) hours as needed.        . labetalol (NORMODYNE) 300 MG tablet Take 450 mg by mouth 2 (two) times daily.        Marland Kitchen levothyroxine (SYNTHROID, LEVOTHROID) 25 MCG tablet Take 25 mcg by mouth daily.        Marland Kitchen lovastatin (MEVACOR) 40  MG tablet Take 80 mg by mouth at bedtime.        . metFORMIN (GLUCOPHAGE-XR) 500 MG 24 hr tablet Take 500 mg by mouth daily with breakfast.        . warfarin (COUMADIN) 5 MG tablet Take by mouth as directed.         Review of Systems All otherwise neg per pt     Objective:   Physical Exam BP 122/70  Pulse 99  Temp(Src) 98.3 F (36.8 C) (Oral)  Ht 5\' 6"  (1.676 m)  Wt 163 lb (73.936 kg)  BMI 26.31 kg/m2  SpO2 98% Physical Exam  VS noted Constitutional: Pt appears well-developed and well-nourished.  HENT: Head: Normocephalic. No JVD Right Ear: External ear normal.  Left Ear: External ear normal.  Eyes: Conjunctivae and EOM are normal. Pupils are equal, round, and reactive to light.  Neck: Normal range of motion. Neck supple.  Cardiovascular: Normal rate and regular rhythm.   Pulmonary/Chest: Effort normal  and breath sounds normal.  Abd:  Soft, NT, non-distended, + BS Neurological: Pt is alert. No cranial nerve deficit.  Skin: Skin is warm. No erythema.  Psychiatric: Pt behavior is normal. Thought content normal.         Assessment & Plan:

## 2010-10-25 NOTE — Assessment & Plan Note (Signed)
stable overall by hx and exam, most recent lab reviewed with pt, and pt to continue medical treatment as before  Lab Results  Component Value Date   LDLCALC 76 10/15/2010

## 2010-10-26 ENCOUNTER — Other Ambulatory Visit: Payer: Self-pay | Admitting: Internal Medicine

## 2010-11-03 ENCOUNTER — Telehealth: Payer: Self-pay | Admitting: Internal Medicine

## 2010-11-03 MED ORDER — FUROSEMIDE 40 MG PO TABS: 60.0000 mg | ORAL_TABLET | Freq: Every day | ORAL | Status: DC

## 2010-11-03 MED ORDER — DILTIAZEM HCL ER COATED BEADS 120 MG PO CP24: 120.0000 mg | ORAL_CAPSULE | Freq: Every day | ORAL | Status: DC

## 2010-11-03 NOTE — Telephone Encounter (Signed)
Called patient informed prescriptions have been sent to her pharmacy.

## 2010-11-03 NOTE — Telephone Encounter (Signed)
Patient states the pharmacy also says she is denied her furosemide.

## 2010-11-03 NOTE — Telephone Encounter (Signed)
Current med list is correct  Refills as needed to be address per robin

## 2010-11-03 NOTE — Telephone Encounter (Signed)
Patient called stating that she was taking 360 mg of her diltiazem and the hospital put her on 120 mg because of her CHF. Patient stated that when she refilled her rx she was not aware that they continued to give her the 360 mg. Now she is being denied refills of the 120mg  because it is noted that they sent a 90 day supply of the the 360 mg. Patient is out of the 120 mg. Please Advise and give patient a call back.

## 2010-11-10 ENCOUNTER — Ambulatory Visit (INDEPENDENT_AMBULATORY_CARE_PROVIDER_SITE_OTHER): Payer: Medicare Other | Admitting: *Deleted

## 2010-11-10 DIAGNOSIS — I2789 Other specified pulmonary heart diseases: Secondary | ICD-10-CM

## 2010-11-10 DIAGNOSIS — I4891 Unspecified atrial fibrillation: Secondary | ICD-10-CM

## 2010-11-10 LAB — POCT INR: INR: 3.7

## 2010-11-10 NOTE — Assessment & Plan Note (Signed)
[facility]                            CARDIOLOGY OFFICE NOTE   [name], [name]                          MRN:          [id number]  DATE:04/04/2007                            DOB:          December 10, 1931    PRIMARY CARE PHYSICIAN:  Corwin Levins, M.D.   REASON FOR VISIT:  Evaluate the patient with chest pain and atrial  fibrillation.   HISTORY OF PRESENT ILLNESS:  The patient presents for follow-up of the  above.  She is 77 years old.  She notices palpitations once in a while.  She gets short of breath predominantly when she bends over to do  something.  She complains of dyspnea with moderate exertion.  However,  this has been a chronic pattern.  She paces herself and is able to do  her chores of daily living without severe limitation.  She does not  describe any resting shortness of breath and has no PND or orthopnea.  She does have the palpitations occasionally if she becomes emotionally  stressed or exerts herself, but these are not particularly limiting.  She is tolerating Coumadin.   PAST MEDICAL HISTORY:  Persistent atrial fibrillation, diabetes mellitus  since 1990's, hypertension x20 years, dyslipidemia x10 years,  depression, hiatal hernia, peptic ulcer disease, colonic polyps,  hypothyroidism, hysterectomy.   ALLERGIES:  CLONIDINE.   CURRENT MEDICATIONS:  1. Coumadin.  2. Hydralazine 50 mg t.i.d.  3. Mevacor 40 mg daily.  4. Glucophage 500 mg daily.  5. Diltiazem 240 mg daily.  6. Lasix 40 mg daily.  7. Levoxyl 25 mcg daily.  8. Glipizide 10 mg b.i.d.  9. Labetalol 400 mg b.i.d.   REVIEW OF SYSTEMS:  As stated in the HPI and otherwise negative for  other systems.   PHYSICAL EXAMINATION:  GENERAL:  The patient is in no distress.  VITAL SIGNS:  Blood pressure 142/78, heart rate 89 and irregular, weight  214 pounds, body mass index 35.  HEENT:  Eyes unremarkable.  Pupils equal, round, and reactive to light.  Fundi not visualized.  NECK:  No internal jugular venous distention to 45 degrees, external  venous distention evident.  Carotid upstroke brisk and symmetric.  No  bruits, no thyromegaly.  LYMPHATICS:  No cervical, axillary, or inguinal adenopathy.  LUNGS:  Clear to auscultation bilaterally.  BACK:  No costovertebral angle tenderness.  CHEST:  Unremarkable.  HEART:  PMI not displaced or sustained.  S1 and S2 within normal limits.  No S3.  No murmurs.  ABDOMEN:  Obese.  Positive bowel sounds.  Normal in frequency and pitch.  No bruits.  No rebound.  No guarding.  No midline pulsatile mass.  No  hepatomegaly and no splenomegaly.  SKIN:  No rashes and no nodules.  EXTREMITIES:  2+ pulses, trace bilateral lower extremity edema.  No  cyanosis and no clubbing.  NEUROLOGY:  Oriented to person, place, and time.  Cranial nerves II-XII  grossly intact.  Motor grossly intact.   EKG; atrial fibrillation, axis within normal limits, intervals within  normal limits, no acute ST T wave changes.  ASSESSMENT:  1. Atrial fibrillation.  The patient is in persistent atrial      fibrillation.  She is tolerating rate control and anticoagulation      and has worn a Holter.  At this point I do not think further change      is warranted.  2. Dyspnea.  This is chronic and baseline and probably related to      deconditioning.  No further cardiovascular testing is suggested.  3. Chest discomfort.  She has not had any recurrence of this.  She had      a stress perfusion study which was low risk.  No further      cardiovascular testing is suggested.  She is to continue with      primary risk reduction.  4. Hypertension.  Blood pressure is upper limits of acceptable.  She      needs therapeutic lifestyle changes to lower it even further.  5. Obesity.  She understands the need to lose weight with diet and      exercise.   FOLLOWUP:  I will see the patient back in one year or sooner if needed.     Rollene Rotunda, MD, [facility]   Electronically Signed    JH/MedQ  DD: 04/04/2007  DT: 04/05/2007  Job #: [id number]   cc:   Corwin Levins, MD

## 2010-11-10 NOTE — Assessment & Plan Note (Signed)
[facility]                         GASTROENTEROLOGY OFFICE NOTE   NAME:[name], [name] CHILDREY                          MRN:          [id number]  DATE:07/05/2007                            DOB:          June 25, 1932    REASON FOR CONSULT:  Personal history of adenomatous colon polyps, on  Coumadin anticoagulation.   HISTORY OF PRESENT ILLNESS:  [name] is a 77 year old African-American  female who has a history of tubulovillous adenomatous colon polyps,  initially diagnosed in August 2001.  Diverticulosis and hemorrhoids were  also noted on that colonoscopy.  The followup colonoscopy in August of  2004 revealed 2 small hyperplastic polyps.  She has had no ongoing  colorectal complaints.  She specifically denies any change in bowel  habits, melena, hematochezia, change in stool caliber, abdominal pain,  rectal pain or weight loss.  She has intermittent mild constipation, and  this has been present for many years.  She takes Milk of Magnesia as  needed.  She is maintained on Coumadin anticoagulation for a history of  atrial fibrillation.   FAMILY HISTORY:  Negative for colon cancer, colon polyps, and  inflammatory bowel disease.   PAST MEDICAL HISTORY:  1. Diabetes mellitus type 2.  2. Hyperlipidemia.  3. Hypertension.  4. Peptic ulcer disease.  5. Tubulovillous adenomatous colon polyps.  6. Diverticulosis.  7. Hemorrhoids.  8. Atrial fibrillation.  9. Chronic Coumadin anticoagulation.  10.Osteoarthritis.  11.Low back pain.  12.A history of iron deficiency anemia.  13.Obesity.  14.Depression.  15.Status post hysterectomy, 1999.   CURRENT MEDICATIONS:  Listed on the chart, updated and reviewed.   MEDICATION ALLERGIES:  CLONIDINE.   SOCIAL HISTORY/REVIEW OF SYSTEMS:  Per the handwritten form.   PHYSICAL EXAMINATION:  Overweight, no acute distress.  Height 5 feet 5 inches, weight 211.4, blood pressure 152/80, pulse 80  and irregular.  HEENT EXAM:   Anicteric sclerae, oropharynx clear.  NECK:  Without thyromegaly or adenopathy.  CHEST:  Clear to auscultation bilaterally.  CARDIAC:  Without murmurs appreciated.  ABDOMEN:  Large, soft, nontender, nondistended, normoactive bowel  sounds.  No palpable organomegaly, masses, or hernias.  RECTAL EXAM:  Deferred to time of colonoscopy.  EXTREMITIES:  Without cyanosis, clubbing, or edema.  NEUROLOGIC:  Alert and oriented x3.  Grossly nonfocal.   ASSESSMENT AND PLAN:  Personal history of tubulovillous adenomatous  colon polyps.  The patient is maintained on Coumadin anticoagulation for  atrial fibrillation.  Risks, benefits, and alternatives to colonoscopy  and possible biopsy and possible polypectomy performed off Coumadin  anticoagulation for 5 days discussed with the patient and she consents  to proceed.  This will be scheduled electively.  She will obtain  clearance to hold the Coumadin for 5 days with plans to restart Coumadin  on the day of the procedure.     Venita Lick. Russella Dar, MD, [facility]  Electronically Signed    MTS/MedQ  DD: 07/05/2007  DT: 07/05/2007  Job #: [id number]   cc:   Corwin Levins, MD  Rollene Rotunda, MD, [facility]

## 2010-11-10 NOTE — Assessment & Plan Note (Signed)
[facility]                            CARDIOLOGY OFFICE NOTE   HENNESSEY, CANTRELL                          MRN:          [id number]  DATE:11/07/2006                            DOB:          1931/10/24    PRIMARY CARE PHYSICIAN:  Dr. Oliver Barre   REASON FOR PRESENTATION:  Evaluate patient with chest pain and new onset  atrial fibrillation.   HISTORY OF PRESENT ILLNESS:  The patient presented to Dr. Jonny Ruiz on April  29.  The night before that she had some difficulty sleeping.  She felt  her heart skipping.  She had a little discomfort in her chest and she  has a very difficult time quantifying or qualifying.  She presented to  Dr. Jonny Ruiz and was noted to be in atrial fibrillation.  She since has had  occasional palpitations, but not as strong as that night.  She is not  having any presyncope or syncope.  She is not particularly active,  though she does some yard work.  She says she might have had some  increased fatigue recently and has not done as much yard work.  She will  occasionally get substernal chest discomfort but this seems to be  sporadic.  She cannot again quantify it or qualify it.  It seems to be  mild.  It does not seem to radiate.  She cannot really bring it on.  It  is probably a chronic pattern rather than new.  She has not been having  any neck or arm discomfort.  She has not been having any new shortness  of breath, PND, or orthopnea.   She is scheduled for a stress perfusion study and an echocardiogram  tomorrow.   PAST MEDICAL HISTORY:  Diabetes mellitus since the 1990s, hypertension  x20 years, hyperlipidemia x10 years, depression, hiatal hernia, peptic  ulcer disease, colonic polyps, new onset atrial fibrillation (duration  unclear), hypothyroidism.   PAST SURGICAL HISTORY:  Hysterectomy.   ALLERGIES:  CLONIDINE   CURRENT MEDICATIONS:  1. Levoxyl 25 mcg daily.  2. Diltiazem 240 mg daily.  3. Glipizide 10 mg b.i.d.  4.  Metformin 500 mg daily.  5. Furosemide 40 mg daily.  6. Lovastatin 40 mg daily.  7. Hydralazine 50 mg t.i.d.  8. Labetalol 300 mg b.i.d.  9. Alprazolam 0.25 mg daily.   SOCIAL HISTORY:  The patient is retired.  She is a recent widow.  She  has 1 child.  She quit smoking in the 1980s after smoking rarely for 5  to 6 years.  She does not drink alcohol.   FAMILY HISTORY:  Is noncontributory for early coronary artery disease.  Her mother did die with diabetes and questionable myocardial infarction  in her 16s.   REVIEW OF SYSTEMS:  As stated in the HPI.  Positive for occasional  cough, constipation, joint pains.  Negative for other systems.   PHYSICAL EXAMINATION:  The patient is in no distress.  Blood pressure 162/98, heart rate 101 and irregular.  Weight 221 pounds,  body mass index 36.  HEENT:  Eyelids  unremarkable.  Pupils are equal, round and react to  light.  Fundi not visualized, oral mucosa unremarkable.  NECK:  No jugular venous distention.  Wave form within normal limits,  carotid upstroke brisk and symmetric.  No bruits, no thyromegaly.  LYMPHATICS:  No cervical, axillary, inguinal adenopathy.  LUNGS:  Clear to auscultation bilaterally.  BACK:  No costovertebral angle tenderness.  CHEST:  Unremarkable.  HEART:  PMI not displaced or sustained, S1 and S2 within normal limits,  no S3, no murmurs.  ABDOMEN:  Obese, positive bowel sounds. Normal in frequency and pitch,  no bruits, no rebound, no guarding, no midline pulse, no mass, no  organomegaly.  SKIN:  No rashes.  No nodules.  EXTREMITIES:  2+ pulses, no edema, no cyanosis, no clubbing.  NEURO:  Oriented to person, place and time; cranial nerves II through  XII grossly intact; motor grossly intact   EKG:  Atrial fibrillation, axis within normal limits, premature  ventricular contraction, non-specific T-wave flattening.   ASSESSMENT AND PLAN:  1. Atrial fibrillation.  The patient does have a CHADS score that puts       her at high risk for strokes.  Therefore, she needs chronic      Coumadin therapy.  We will refer to the Coumadin Clinic.  The      patient is in atrial fibrillation which is slightly symptomatic.      At this point I am going to increase labetalol to 400 mg b.i.d.      This will help with her blood pressure as well.  Eventually I will      follow her with an event monitor.  I agree with the echocardiogram.  2. Chest discomfort.  The patient's chest discomfort is somewhat      atypical.  However, she has a difficult time describing it.  She      has multiple cardiovascular risk factors.  I think a stress      perfusion study is indicated as the pretest probability is      moderate.  Again this is scheduled for tomorrow.  3. Hypertension.  This is being managed with the increased dose of      labetalol as above.  She will continue the other medications as      listed.  4. Followup.  I would like to see the patient back in a couple of      weeks after the echo and a      stress perfusion study are available.  Again at that time I will      most likely order a Holter to demonstrate rate control.      Alternatively we might discuss cardioversion.     Rollene Rotunda, MD, [facility]     JH/MedQ  DD: 11/07/2006  DT: 11/07/2006  Job #: [id number]   cc:   Corwin Levins, MD

## 2010-11-10 NOTE — Assessment & Plan Note (Signed)
[facility]                            CARDIOLOGY OFFICE NOTE   [name], [name]                          MRN:          [id number]  DATE:05/09/2008                            DOB:          [birth year]    PRIMARY CARE PHYSICIAN:  Corwin Levins, MD.   REASON FOR PRESENTATION:  Evaluate the patient with atrial fibrillation.   HISTORY OF PRESENT ILLNESS:  The patient returns for yearly followup.  Since I last saw her, she has done relatively well.  She is not an  overly active person, but she does her house work with some vacuum and  also does some yard work.  She denies any chest pressure, neck, or arm  discomfort.  She has had no palpitations, presyncope, or syncope.  She  denies any PND or orthopnea.  She has had some lower extremity leg  swelling.  She has been treated with diuretics.  She has had recent  echocardiogram ordered by Dr. Jonny Ruiz.  I reviewed this today.  This  demonstrates ejection fraction to be 55-65%.  There were no significant  valvular abnormalities.  The tricuspid valve did demonstrated moderate  tricuspid valve regurgitation.  There was mild-to-moderate increase in  pulmonary pressures.   The patient denies any new shortness of breath, though she will get  dyspneic with significant exertion.  Again, she is not getting shortness  of breath at rest, however.   PAST MEDICAL HISTORY:  Persistent atrial fibrillation, diabetes mellitus  since in 1990s, hypertension x20 years, dyslipidemia x10 years,  depression, hiatal hernia, peptic ulcer disease, colonic polyps,  hypothyroidism, hysterectomy, tricuspid regurgitation with mild-to-  moderate pulmonary hypertension as described above.   ALLERGIES/INTOLERANCES:  CLONIDINE.   MEDICATIONS:  1. Coumadin.  2. Hydralazine 50 mg t.i.d.  3. Glucophage 500 mg daily.  4. Diltiazem 240 mg daily.  5. Levoxyl 25 mcg daily.  6. Labetalol 300 mg b.i.d.  7. Lovastatin 40 mg daily.  8.  Citalopram 10 mg daily.  9. Lasix 60 mg daily.  10.Glipizide 10 mg q.a.m. and now 5 mg q.p.m.   REVIEW OF SYSTEMS:  As stated in the HPI and otherwise negative for all  other systems.   PHYSICAL EXAMINATION:  GENERAL:  The patient is in no distress.  VITAL SIGNS:  Blood pressure 142/95, heart rate 98 and regular, weight  211 pounds, and body mass index 35.  HEENT:  Eyelids are unremarkable, pupils are equal, round, and reactive  to light, fundi not visualized, oral mucosa unremarkable.  NECK:  No jugular venous distention at 45 degrees, carotid upstroke  brisk and symmetric, no bruits, no thyromegaly.  LYMPHATICS:  No adenopathy.  LUNGS:  Clear to auscultation bilaterally.  BACK:  No costovertebral angle tenderness.  CHEST:  Unremarkable.  HEART:  PMI not displaced or sustained, S1 and S2 within normal limits,  no S3, no murmurs.  ABDOMEN:  Obese, positive bowel sounds.  Normal in frequency and pitch,  no bruits, no rebound, no guarding, no midline pulsatile mass, no  organomegaly.  SKIN:  No rashes, no nodules.  EXTREMITIES:  Pulses are 2+ throughout, mild bilateral lower extremity  edema, no cyanosis, no clubbing.  NEURO:  Oriented to person, place, and time, cranial nerves II-XII are  grossly intact, motor grossly intact.   EKG; atrial fibrillation, rate 81, right bundle-branch block, and no  acute ST-wave changes.   ASSESSMENT AND PLAN:  1. Atrial fibrillation.  The patient is maintaining this rhythm      without symptoms.  She is on medicines for rate control.  She is on      anticoagulation.  At this point, no further cardiovascular testing      is suggested.  She will continue with this strategy.  2. Lower extremity swelling.  The patient does have some mild-to-      moderate pulmonary hypertension, which may contribute to her      swelling.  At this point, I think the current dose of diuretic is      reasonable.  She should also have a conservative therapies to keep       her feet elevated, salt restriction, fluid restriction, and      possible compression stockings if this worsens.  Of note, I do not      think she needs any specific therapy for her pulmonary hypertension      as it is unlikely to be a primary pulmonary hypertension and it is      rather likely to be secondary.  She is already on Coumadin, which      would treat any chronic thromboembolic pulmonary hypertension.  3. Hypertension.  Blood pressure is controlled.  She will continue      medications as listed.  4. Followup.  I will see her back in 1 year or sooner if she has any      new symptoms or worsening lower extremity swelling.     Rollene Rotunda, MD, [facility]  Electronically Signed    JH/MedQ  DD: 05/09/2008  DT: 05/10/2008  Job #: [id number]   cc:   Corwin Levins, MD

## 2010-11-10 NOTE — Assessment & Plan Note (Signed)
[facility]                            CARDIOLOGY OFFICE NOTE   NAME:[name]                          MRN:          [id number]  DATE:12/05/2006                            DOB:          [birth year]    PRIMARY CARE PHYSICIAN:  Dr. Oliver Barre.   REASON FOR PRESENTATION:  Evaluate patient with chest pain and atrial  fibrillation.   HISTORY OF PRESENT ILLNESS:  This is the 2nd office visit for this 77-  year-old African-American female with atrial fibrillation.  She  complained of some dyspnea and chest discomfort.  At the last visit, I  ordered an echocardiogram which demonstrated a well-preserved ejection  fraction, 50%-60%.  She did have left ventricular hypertrophy.  She had  some elevated pulmonary pressures as well with moderate tricuspid  regurgitation.  Her stress perfusion study demonstrated no evidence of  ischemia.  There is questionable prior small anterior infarct.   The patient says she does relatively well.  She will get dyspneic if she  tries to do moderate activity.  She does not have any shortness of  breath at rest.  She is not having any PND or orthopnea.  She has not  been having palpitations, presyncope, or syncope.   She did tolerate the increased dose of beta blocker that was prescribed  at the last visit.   PAST MEDICAL HISTORY:  1. Atrial fibrillation, persistent.  2. Diabetes mellitus since 1990s.  3. Hypertension x20 years.  4. Hyperlipidemia x10 years.  5. Depression.  6. Hiatal hernia.  7. Peptic ulcer disease.  8. Colonic polyps.  9. Hypothyroidism.  10.Hysterectomy.   ALLERGIES:  CLONIDINE.   MEDICATIONS:  1. Hydralazine 50 mg t.i.d.  2. Mevacor 40 mg daily.  3. Glucophage 500 mg daily.  4. Diltiazem 240 mg daily.  5. Lasix 40 mg daily.  6. Levoxyl.  7. Glipizide 10 mg b.i.d.  8. Labetalol 400 mg b.i.d.   REVIEW OF SYSTEMS:  As stated in the HPI and otherwise negative for  other systems.   PHYSICAL EXAMINATION:  Patient is in no distress.  Blood pressure 152/92, heart rate 78 and irregular, weight 217 pounds,  body mass index 35.  HEENT:  Eyelids unremarkable, pupils are equal, round, and reactive to  light, fundi not visualized.  Oral mucosa unremarkable.  NECK:  No jugular venous distension at 45 degrees, wave form within  normal limits, carotid upstroke brisk and symmetrical.  No bruit.  No  thyromegaly.  LYMPHATICS:  No cervical, axillary, inguinal adenopathy.  LUNGS:  Clear to auscultation bilaterally.  BACK:  No costovertebral angle tenderness.  CHEST:  Unremarkable.  HEART:  PMI not displaced or sustained.  S1 and S2 are within normal  limits.  No S3, 2/6 lower left sternal border systolic murmur.  No  diastolic murmurs.  ABDOMEN:  Obese, positive bowel sounds, normal in frequency and pitch.  No bruits, no rebound, no guarding.  No midline pulsatile mass.  No  hepatomegaly, no splenomegaly.  SKIN:  No rashes, no nodules.  EXTREMITIES:  2+ upper pulses, unable to  appreciate dorsalis pedis and  posterior tibialis today, mild bilateral lower extremity edema.  NEURO:  Grossly intact.   EKG:  Atrial fibrillation, axis within normal limits, intervals within  normal limits, inferolateral T-wave inversion unchanged from previous  EKGs.   ASSESSMENT AND PLAN:  1. Atrial fibrillation.  Today I will start the patient on Coumadin 5      mg daily.  She is going to come back in 3 days and have her first      Coumadin appointment.  She understands the risks and benefits of      this.  She is going to remain on the medications as listed and I      will check a Holter monitor for 48 hours to see if she has adequate      rate control.  2. Dyspnea.  This is not related to systolic dysfunction or ischemia.      It may be related to deconditioning, obesity, and perhaps some      element of diastolic dysfunction.  She should have salt and fluid      restriction.  She needs good  control of her blood pressure.  She      should keep a blood pressure diary.  For now she will remain on the      medications as listed.  3. Lower extremity swelling.  Again, she needs to understand salt and      fluid restriction and keeping her feet elevated.  4. Chest discomfort.  This is stable.  No further cardiovascular      testing is suggested.  5. I will see her back in about 3 months or sooner if needed.     Rollene Rotunda, MD, [facility]  Electronically Signed    JH/MedQ  DD: 12/05/2006  DT: 12/05/2006  Job #: [id number]   cc:   Corwin Levins, MD

## 2010-12-01 ENCOUNTER — Ambulatory Visit (INDEPENDENT_AMBULATORY_CARE_PROVIDER_SITE_OTHER): Payer: Medicare Other | Admitting: *Deleted

## 2010-12-01 DIAGNOSIS — I4891 Unspecified atrial fibrillation: Secondary | ICD-10-CM

## 2010-12-01 DIAGNOSIS — I2789 Other specified pulmonary heart diseases: Secondary | ICD-10-CM

## 2010-12-01 LAB — POCT INR: INR: 3.8

## 2010-12-15 ENCOUNTER — Other Ambulatory Visit: Payer: Self-pay

## 2010-12-18 ENCOUNTER — Ambulatory Visit (INDEPENDENT_AMBULATORY_CARE_PROVIDER_SITE_OTHER): Payer: Medicare Other | Admitting: *Deleted

## 2010-12-18 ENCOUNTER — Encounter: Payer: Self-pay | Admitting: Cardiology

## 2010-12-18 ENCOUNTER — Ambulatory Visit (INDEPENDENT_AMBULATORY_CARE_PROVIDER_SITE_OTHER): Payer: Medicare Other | Admitting: Cardiology

## 2010-12-18 DIAGNOSIS — I4891 Unspecified atrial fibrillation: Secondary | ICD-10-CM

## 2010-12-18 DIAGNOSIS — I10 Essential (primary) hypertension: Secondary | ICD-10-CM

## 2010-12-18 DIAGNOSIS — N289 Disorder of kidney and ureter, unspecified: Secondary | ICD-10-CM

## 2010-12-18 DIAGNOSIS — F329 Major depressive disorder, single episode, unspecified: Secondary | ICD-10-CM

## 2010-12-18 DIAGNOSIS — I2789 Other specified pulmonary heart diseases: Secondary | ICD-10-CM

## 2010-12-18 DIAGNOSIS — E785 Hyperlipidemia, unspecified: Secondary | ICD-10-CM

## 2010-12-18 DIAGNOSIS — R609 Edema, unspecified: Secondary | ICD-10-CM

## 2010-12-18 LAB — POCT INR: INR: 2.1

## 2010-12-18 NOTE — Assessment & Plan Note (Signed)
She tolerates this rhythm. No change in therapy is indicated. She will continue with Coumadin and rate control.

## 2010-12-18 NOTE — Assessment & Plan Note (Signed)
She does have some pulmonary hypertension. For now she seems to be relatively euvolemic and I think she is compliant with her compression stockings, salt and fluid restriction and keeping her legs elevated.

## 2010-12-18 NOTE — Assessment & Plan Note (Signed)
I reviewed her labs and her creatinine was stable. No further testing is indicated.

## 2010-12-18 NOTE — Progress Notes (Signed)
HPI The patient presents for followup of predominantly right-sided heart failure and atrial fibrillation. She has been doing relatively well since we last saw her. She's not had any symptomatic atrial fibrillation though she is permanently in this rhythm. She's had no increased lower extremity swelling. She does get around very slowly with a walker but she's had no presyncope or syncope. She has had no chest pressure, neck or arm discomfort. She has had no weight gain. She tolerates the medicines as listed. I did review her labs she's had stable BUN and creatinine. At the last lab draw in April.  No Known Allergies  Current Outpatient Prescriptions  Medication Sig Dispense Refill  . ALPRAZolam (XANAX) 0.25 MG tablet Take 0.25 mg by mouth 2 (two) times daily as needed.        . Cholecalciferol (VITAMIN D) 1000 UNITS capsule Take 1,000 Units by mouth daily.        . citalopram (CELEXA) 20 MG tablet Take 20 mg by mouth daily.        Marland Kitchen diltiazem (CARDIZEM CD) 120 MG 24 hr capsule Take 1 capsule (120 mg total) by mouth daily.  30 capsule  11  . furosemide (LASIX) 40 MG tablet Take 1.5 tablets (60 mg total) by mouth daily.  30 tablet  11  . hydrALAZINE (APRESOLINE) 50 MG tablet Take 50 mg by mouth 3 (three) times daily.        Marland Kitchen HYDROcodone-acetaminophen (NORCO) 5-325 MG per tablet Take 1 tablet by mouth every 8 (eight) hours as needed.        . labetalol (NORMODYNE) 300 MG tablet Take 450 mg by mouth 2 (two) times daily.        Marland Kitchen levothyroxine (SYNTHROID, LEVOTHROID) 25 MCG tablet Take 25 mcg by mouth daily.        Marland Kitchen lovastatin (MEVACOR) 40 MG tablet Take 80 mg by mouth at bedtime.        . metFORMIN (GLUCOPHAGE-XR) 500 MG 24 hr tablet Take 500 mg by mouth daily with breakfast.        . warfarin (COUMADIN) 5 MG tablet Take by mouth as directed.          Past Medical History  Diagnosis Date  . Obesity   . Anemia, iron deficiency   . Depression   . Diabetes mellitus type II   . Hyperlipidemia     . Hypertension   . Peptic ulcer   . History of uterine fibroid   . Hx of colonic polyps   . Diverticula, colon   . Osteoarthritis   . Low back pain   . Atrial fibrillation     permanent, on coumadin (followed by [facility] coumadin clinic)  . Pulmonary hypertension     echo 4/12:  EF 55-60%, mod LVH, mild BAE, RVE and PASP 51  . Anxiety   . DIABETES MELLITUS, TYPE II 03/05/2007  . HYPERLIPIDEMIA 03/05/2007  . ANEMIA-IRON DEFICIENCY 03/05/2007  . HYPERTENSION 03/05/2007  . DEPRESSION 03/05/2007  . LOW BACK PAIN 03/05/2007  . PERIPHERAL EDEMA 04/29/2008  . Chronic pain syndrome 06/23/2010  . FIBROIDS, UTERUS 03/05/2007  . PEPTIC ULCER DISEASE 03/05/2007  . DIVERTICULOSIS, COLON 03/05/2007  . OSTEOARTHRITIS 03/05/2007  . COLONIC POLYPS, HX OF 03/05/2007  . Hypothyroidism 10/15/2010  . Renal insufficiency 10/15/2010    Past Surgical History  Procedure Date  . Laparoscopic hysterectomy     ROS:  As stated in the HPI and negative for all other systems.  PHYSICAL EXAM BP 144/83  Pulse 104  Resp 16  Ht 5\' 4"  (1.626 m)  Wt 161 lb (73.029 kg)  BMI 27.64 kg/m2 GENERAL:  Well appearing HEENT:  Pupils equal round and reactive, fundi not visualized, oral mucosa unremarkable, poor dentition NECK:  JVD to jaw at 45 degrees, waveform within normal limits, carotid upstroke brisk and symmetric, no bruits, no thyromegaly LYMPHATICS:  No cervical, inguinal adenopathy LUNGS:  Clear to auscultation bilaterally BACK:  No CVA tenderness CHEST:  Unremarkable HEART:  PMI not displaced or sustained,S1 normal fixed split S2, no S3, no S4, no clicks, no rubs, no murmurs, irregular,  ABD:  Flat, positive bowel sounds normal in frequency in pitch, no bruits, no rebound, no guarding, no midline pulsatile mass, no hepatomegaly, no splenomegaly EXT:  2 plus pulses upper mildly diminished lower, mild edema, no cyanosis no clubbing SKIN:  No rashes no nodules NEURO:  Cranial nerves II through XII grossly intact, motor grossly  intact throughout PSYCH:  Cognitively intact, oriented to person place and time   ASSESSMENT AND PLAN

## 2010-12-18 NOTE — Assessment & Plan Note (Signed)
Her blood pressure is very slightly elevated today. However, she can continue the meds as listed.

## 2010-12-18 NOTE — Patient Instructions (Signed)
Follow up with Dr Hochrein in 6 months Continue current medications as listed 

## 2010-12-18 NOTE — Assessment & Plan Note (Signed)
I reviewed her lipid profile from April with an HDL of 68.9 and LDL of 76. This is excellent. No change in therapy is indicated.

## 2010-12-22 ENCOUNTER — Ambulatory Visit: Payer: Self-pay | Admitting: Internal Medicine

## 2010-12-30 ENCOUNTER — Other Ambulatory Visit: Payer: Self-pay | Admitting: Internal Medicine

## 2010-12-31 ENCOUNTER — Other Ambulatory Visit: Payer: Self-pay | Admitting: Internal Medicine

## 2011-01-08 ENCOUNTER — Ambulatory Visit (INDEPENDENT_AMBULATORY_CARE_PROVIDER_SITE_OTHER): Payer: Medicare Other | Admitting: *Deleted

## 2011-01-08 DIAGNOSIS — I4891 Unspecified atrial fibrillation: Secondary | ICD-10-CM

## 2011-01-08 DIAGNOSIS — I2789 Other specified pulmonary heart diseases: Secondary | ICD-10-CM

## 2011-01-26 ENCOUNTER — Ambulatory Visit (INDEPENDENT_AMBULATORY_CARE_PROVIDER_SITE_OTHER): Payer: Medicare Other | Admitting: *Deleted

## 2011-01-26 DIAGNOSIS — I4891 Unspecified atrial fibrillation: Secondary | ICD-10-CM

## 2011-01-26 DIAGNOSIS — I2789 Other specified pulmonary heart diseases: Secondary | ICD-10-CM

## 2011-02-01 ENCOUNTER — Other Ambulatory Visit: Payer: Self-pay

## 2011-02-01 MED ORDER — METFORMIN HCL ER 500 MG PO TB24: 500.0000 mg | ORAL_TABLET | Freq: Every day | ORAL | Status: DC

## 2011-02-22 ENCOUNTER — Other Ambulatory Visit: Payer: Self-pay

## 2011-02-22 MED ORDER — HYDROCODONE-ACETAMINOPHEN 5-325 MG PO TABS: 1.0000 | ORAL_TABLET | Freq: Three times a day (TID) | ORAL | Status: DC | PRN

## 2011-02-22 NOTE — Telephone Encounter (Signed)
A user error has taken place: encounter opened in error, closed for administrative reasons.

## 2011-02-23 ENCOUNTER — Ambulatory Visit (INDEPENDENT_AMBULATORY_CARE_PROVIDER_SITE_OTHER): Payer: Medicare Other | Admitting: *Deleted

## 2011-02-23 DIAGNOSIS — I2789 Other specified pulmonary heart diseases: Secondary | ICD-10-CM

## 2011-02-23 DIAGNOSIS — I4891 Unspecified atrial fibrillation: Secondary | ICD-10-CM

## 2011-02-23 LAB — POCT INR: INR: 2.4

## 2011-02-23 NOTE — Telephone Encounter (Signed)
Faxed hardcopy to pharmacy. 

## 2011-03-01 ENCOUNTER — Other Ambulatory Visit: Payer: Self-pay | Admitting: Internal Medicine

## 2011-03-16 ENCOUNTER — Other Ambulatory Visit: Payer: Self-pay

## 2011-03-16 MED ORDER — LABETALOL HCL 300 MG PO TABS: ORAL_TABLET | ORAL | Status: DC

## 2011-03-17 ENCOUNTER — Inpatient Hospital Stay (HOSPITAL_COMMUNITY)
Admission: EM | Admit: 2011-03-17 | Discharge: 2011-03-24 | DRG: 555 | Disposition: A | Discharge status: Skilled Nursing Facility | Payer: Medicare Other | Source: Ambulatory Visit | Attending: Internal Medicine | Admitting: Internal Medicine

## 2011-03-17 ENCOUNTER — Emergency Department (HOSPITAL_COMMUNITY): Payer: Medicare Other

## 2011-03-17 DIAGNOSIS — E785 Hyperlipidemia, unspecified: Secondary | ICD-10-CM | POA: Diagnosis present

## 2011-03-17 DIAGNOSIS — T45515A Adverse effect of anticoagulants, initial encounter: Secondary | ICD-10-CM | POA: Diagnosis present

## 2011-03-17 DIAGNOSIS — D72829 Elevated white blood cell count, unspecified: Secondary | ICD-10-CM | POA: Diagnosis present

## 2011-03-17 DIAGNOSIS — N183 Chronic kidney disease, stage 3 unspecified: Secondary | ICD-10-CM | POA: Diagnosis present

## 2011-03-17 DIAGNOSIS — E876 Hypokalemia: Secondary | ICD-10-CM | POA: Diagnosis present

## 2011-03-17 DIAGNOSIS — F3289 Other specified depressive episodes: Secondary | ICD-10-CM | POA: Diagnosis present

## 2011-03-17 DIAGNOSIS — I129 Hypertensive chronic kidney disease with stage 1 through stage 4 chronic kidney disease, or unspecified chronic kidney disease: Secondary | ICD-10-CM | POA: Diagnosis present

## 2011-03-17 DIAGNOSIS — I4891 Unspecified atrial fibrillation: Secondary | ICD-10-CM | POA: Diagnosis present

## 2011-03-17 DIAGNOSIS — E119 Type 2 diabetes mellitus without complications: Secondary | ICD-10-CM | POA: Diagnosis present

## 2011-03-17 DIAGNOSIS — I509 Heart failure, unspecified: Secondary | ICD-10-CM | POA: Diagnosis present

## 2011-03-17 DIAGNOSIS — K5900 Constipation, unspecified: Secondary | ICD-10-CM | POA: Diagnosis present

## 2011-03-17 DIAGNOSIS — I5031 Acute diastolic (congestive) heart failure: Secondary | ICD-10-CM | POA: Diagnosis present

## 2011-03-17 DIAGNOSIS — F329 Major depressive disorder, single episode, unspecified: Secondary | ICD-10-CM | POA: Diagnosis present

## 2011-03-17 DIAGNOSIS — Z7982 Long term (current) use of aspirin: Secondary | ICD-10-CM

## 2011-03-17 DIAGNOSIS — I2789 Other specified pulmonary heart diseases: Secondary | ICD-10-CM | POA: Diagnosis present

## 2011-03-17 DIAGNOSIS — E039 Hypothyroidism, unspecified: Secondary | ICD-10-CM | POA: Diagnosis present

## 2011-03-17 DIAGNOSIS — I4510 Unspecified right bundle-branch block: Secondary | ICD-10-CM | POA: Diagnosis present

## 2011-03-17 DIAGNOSIS — Z79899 Other long term (current) drug therapy: Secondary | ICD-10-CM

## 2011-03-17 DIAGNOSIS — M7981 Nontraumatic hematoma of soft tissue: Principal | ICD-10-CM | POA: Diagnosis present

## 2011-03-17 DIAGNOSIS — Z794 Long term (current) use of insulin: Secondary | ICD-10-CM

## 2011-03-17 DIAGNOSIS — N179 Acute kidney failure, unspecified: Secondary | ICD-10-CM | POA: Diagnosis present

## 2011-03-17 LAB — DIFFERENTIAL
Basophils Absolute: 0 10*3/uL (ref 0.0–0.1)
Basophils Relative: 0 % (ref 0–1)
Eosinophils Absolute: 0 10*3/uL (ref 0.0–0.7)
Neutro Abs: 8.8 10*3/uL — ABNORMAL HIGH (ref 1.7–7.7)
Neutrophils Relative %: 87 % — ABNORMAL HIGH (ref 43–77)

## 2011-03-17 LAB — BASIC METABOLIC PANEL
Chloride: 99 mEq/L (ref 96–112)
GFR calc Af Amer: 49 mL/min — ABNORMAL LOW (ref >60)
GFR calc non Af Amer: 41 mL/min — ABNORMAL LOW (ref >60)
Potassium: 4.1 mEq/L (ref 3.5–5.1)
Sodium: 137 mEq/L (ref 135–145)

## 2011-03-17 LAB — CBC
Hemoglobin: 9 g/dL — ABNORMAL LOW (ref 12.0–15.0)
MCH: 29.1 pg (ref 26.0–34.0)
MCHC: 32.6 g/dL (ref 30.0–36.0)
Platelets: 341 10*3/uL (ref 150–400)
RBC: 3.09 MIL/uL — ABNORMAL LOW (ref 3.87–5.11)

## 2011-03-17 LAB — POCT I-STAT TROPONIN I: Troponin i, poc: 0.01 ng/mL (ref 0.00–0.08)

## 2011-03-17 LAB — PROTIME-INR
INR: 4.82 — ABNORMAL HIGH (ref 0.00–1.49)
Prothrombin Time: 45.8 seconds — ABNORMAL HIGH (ref 11.6–15.2)

## 2011-03-18 ENCOUNTER — Inpatient Hospital Stay (HOSPITAL_COMMUNITY): Payer: Medicare Other

## 2011-03-18 LAB — HEPATIC FUNCTION PANEL
Alkaline Phosphatase: 83 U/L (ref 39–117)
Bilirubin, Direct: 0.2 mg/dL (ref 0.0–0.3)
Indirect Bilirubin: 0.5 mg/dL (ref 0.3–0.9)
Total Bilirubin: 0.7 mg/dL (ref 0.3–1.2)

## 2011-03-18 LAB — COMPREHENSIVE METABOLIC PANEL
ALT: 17 U/L (ref 0–35)
CO2: 28 mEq/L (ref 19–32)
Calcium: 10.2 mg/dL (ref 8.4–10.5)
Creatinine, Ser: 1.1 mg/dL (ref 0.50–1.10)
GFR calc Af Amer: 58 mL/min — ABNORMAL LOW (ref >60)
GFR calc non Af Amer: 48 mL/min — ABNORMAL LOW (ref >60)
Glucose, Bld: 163 mg/dL — ABNORMAL HIGH (ref 70–99)
Sodium: 137 mEq/L (ref 135–145)
Total Protein: 6.8 g/dL (ref 6.0–8.3)

## 2011-03-18 LAB — TECHNOLOGIST SMEAR REVIEW

## 2011-03-18 LAB — PHOSPHORUS: Phosphorus: 2.8 mg/dL (ref 2.3–4.6)

## 2011-03-18 LAB — APTT: aPTT: 61 seconds — ABNORMAL HIGH (ref 24–37)

## 2011-03-18 LAB — LACTATE DEHYDROGENASE: LDH: 212 U/L (ref 94–250)

## 2011-03-18 LAB — GLUCOSE, CAPILLARY
Glucose-Capillary: 199 mg/dL — ABNORMAL HIGH (ref 70–99)
Glucose-Capillary: 152 mg/dL — ABNORMAL HIGH (ref 70–99)
Glucose-Capillary: 159 mg/dL — ABNORMAL HIGH (ref 70–99)

## 2011-03-18 LAB — TSH: TSH: 1.356 u[IU]/mL (ref 0.350–4.500)

## 2011-03-18 LAB — PROTIME-INR: Prothrombin Time: 48.2 seconds — ABNORMAL HIGH (ref 11.6–15.2)

## 2011-03-18 LAB — C-REACTIVE PROTEIN: CRP: 7.02 mg/dL — ABNORMAL HIGH (ref <0.60)

## 2011-03-19 LAB — COMPREHENSIVE METABOLIC PANEL
ALT: 17 U/L (ref 0–35)
AST: 23 U/L (ref 0–37)
Albumin: 2.9 g/dL — ABNORMAL LOW (ref 3.5–5.2)
Alkaline Phosphatase: 70 U/L (ref 39–117)
CO2: 32 mEq/L (ref 19–32)
Chloride: 97 mEq/L (ref 96–112)
Creatinine, Ser: 1.31 mg/dL — ABNORMAL HIGH (ref 0.50–1.10)
GFR calc non Af Amer: 39 mL/min — ABNORMAL LOW (ref >60)
Potassium: 3.8 mEq/L (ref 3.5–5.1)
Total Bilirubin: 0.7 mg/dL (ref 0.3–1.2)

## 2011-03-19 LAB — GLUCOSE, CAPILLARY

## 2011-03-19 LAB — CBC
HCT: 24 % — ABNORMAL LOW (ref 36.0–46.0)
Hemoglobin: 8 g/dL — ABNORMAL LOW (ref 12.0–15.0)
MCHC: 33.3 g/dL (ref 30.0–36.0)
RBC: 2.72 MIL/uL — ABNORMAL LOW (ref 3.87–5.11)

## 2011-03-19 LAB — BETA 2 MICROGLOBULIN, SERUM: Beta-2 Microglobulin: 3.38 mg/L — ABNORMAL HIGH (ref 1.01–1.73)

## 2011-03-19 LAB — PROTIME-INR
INR: 6.74 (ref 0.00–1.49)
Prothrombin Time: 59.5 seconds — ABNORMAL HIGH (ref 11.6–15.2)

## 2011-03-20 ENCOUNTER — Inpatient Hospital Stay (HOSPITAL_COMMUNITY): Payer: Medicare Other

## 2011-03-20 LAB — COMPREHENSIVE METABOLIC PANEL
AST: 24 U/L (ref 0–37)
Albumin: 2.7 g/dL — ABNORMAL LOW (ref 3.5–5.2)
Alkaline Phosphatase: 69 U/L (ref 39–117)
BUN: 35 mg/dL — ABNORMAL HIGH (ref 6–23)
Chloride: 97 mEq/L (ref 96–112)
Potassium: 3.3 mEq/L — ABNORMAL LOW (ref 3.5–5.1)
Total Protein: 5.7 g/dL — ABNORMAL LOW (ref 6.0–8.3)

## 2011-03-20 LAB — DIFFERENTIAL
Basophils Absolute: 0 10*3/uL (ref 0.0–0.1)
Basophils Relative: 0 % (ref 0–1)
Eosinophils Absolute: 0 10*3/uL (ref 0.0–0.7)
Monocytes Relative: 5 % (ref 3–12)
Neutro Abs: 8.1 10*3/uL — ABNORMAL HIGH (ref 1.7–7.7)
Neutrophils Relative %: 89 % — ABNORMAL HIGH (ref 43–77)

## 2011-03-20 LAB — CBC
Hemoglobin: 7.3 g/dL — ABNORMAL LOW (ref 12.0–15.0)
MCH: 29.8 pg (ref 26.0–34.0)
Platelets: 275 10*3/uL (ref 150–400)
RBC: 2.45 MIL/uL — ABNORMAL LOW (ref 3.87–5.11)
WBC: 9.1 10*3/uL (ref 4.0–10.5)

## 2011-03-20 LAB — ABO/RH: ABO/RH(D): A POS

## 2011-03-20 LAB — IGG, IGA, IGM
IgA: 197 mg/dL (ref 69–380)
IgG (Immunoglobin G), Serum: 870 mg/dL (ref 690–1700)
IgM, Serum: 35 mg/dL — ABNORMAL LOW (ref 52–322)

## 2011-03-20 LAB — PROTIME-INR
INR: 7.36 (ref 0.00–1.49)
Prothrombin Time: 63.7 seconds — ABNORMAL HIGH (ref 11.6–15.2)

## 2011-03-20 LAB — GLUCOSE, CAPILLARY
Glucose-Capillary: 146 mg/dL — ABNORMAL HIGH (ref 70–99)
Glucose-Capillary: 164 mg/dL — ABNORMAL HIGH (ref 70–99)

## 2011-03-21 LAB — CROSSMATCH
ABO/RH(D): A POS
Antibody Screen: NEGATIVE
Unit division: 0
Unit division: 0

## 2011-03-21 LAB — URINE MICROSCOPIC-ADD ON

## 2011-03-21 LAB — PREPARE FRESH FROZEN PLASMA: Unit division: 0

## 2011-03-21 LAB — URINALYSIS, ROUTINE W REFLEX MICROSCOPIC
Bilirubin Urine: NEGATIVE
Glucose, UA: NEGATIVE mg/dL
Ketones, ur: NEGATIVE mg/dL
Specific Gravity, Urine: 1.008 (ref 1.005–1.030)
pH: 8 (ref 5.0–8.0)

## 2011-03-21 LAB — GLUCOSE, CAPILLARY
Glucose-Capillary: 144 mg/dL — ABNORMAL HIGH (ref 70–99)
Glucose-Capillary: 223 mg/dL — ABNORMAL HIGH (ref 70–99)
Glucose-Capillary: 167 mg/dL — ABNORMAL HIGH (ref 70–99)
Glucose-Capillary: 153 mg/dL — ABNORMAL HIGH (ref 70–99)

## 2011-03-21 LAB — CBC
HCT: 27.8 % — ABNORMAL LOW (ref 36.0–46.0)
Hemoglobin: 9.2 g/dL — ABNORMAL LOW (ref 12.0–15.0)
MCHC: 33.1 g/dL (ref 30.0–36.0)
MCV: 88 fL (ref 78.0–100.0)
Platelets: 264 10*3/uL (ref 150–400)
RBC: 3.21 MIL/uL — ABNORMAL LOW (ref 3.87–5.11)
RDW: 14.9 % (ref 11.5–15.5)
RDW: 14.9 % (ref 11.5–15.5)
WBC: 13 10*3/uL — ABNORMAL HIGH (ref 4.0–10.5)

## 2011-03-21 LAB — COMPREHENSIVE METABOLIC PANEL
AST: 22 U/L (ref 0–37)
Albumin: 3.2 g/dL — ABNORMAL LOW (ref 3.5–5.2)
BUN: 27 mg/dL — ABNORMAL HIGH (ref 6–23)
Chloride: 96 mEq/L (ref 96–112)
Creatinine, Ser: 1.12 mg/dL — ABNORMAL HIGH (ref 0.50–1.10)
Total Bilirubin: 1.9 mg/dL — ABNORMAL HIGH (ref 0.3–1.2)
Total Protein: 6.4 g/dL (ref 6.0–8.3)

## 2011-03-22 LAB — BASIC METABOLIC PANEL
BUN: 31 mg/dL — ABNORMAL HIGH (ref 6–23)
Creatinine, Ser: 1.21 mg/dL — ABNORMAL HIGH (ref 0.50–1.10)
GFR calc Af Amer: 52 mL/min — ABNORMAL LOW (ref >60)
GFR calc non Af Amer: 43 mL/min — ABNORMAL LOW (ref >60)
Glucose, Bld: 139 mg/dL — ABNORMAL HIGH (ref 70–99)
Potassium: 3.1 mEq/L — ABNORMAL LOW (ref 3.5–5.1)

## 2011-03-22 LAB — PROTEIN ELECTROPH W RFLX QUANT IMMUNOGLOBULINS
Albumin ELP: 48.9 % — ABNORMAL LOW (ref 55.8–66.1)
Alpha-1-Globulin: 8.7 % — ABNORMAL HIGH (ref 2.9–4.9)
Alpha-2-Globulin: 16 % — ABNORMAL HIGH (ref 7.1–11.8)
Beta 2: 5.4 % (ref 3.2–6.5)
Beta Globulin: 6 % (ref 4.7–7.2)
Gamma Globulin: 15 % (ref 11.1–18.8)
M-Spike, %: NOT DETECTED g/dL
Total Protein ELP: 6.5 g/dL (ref 6.0–8.3)

## 2011-03-22 LAB — CBC
HCT: 26.8 % — ABNORMAL LOW (ref 36.0–46.0)
Hemoglobin: 9 g/dL — ABNORMAL LOW (ref 12.0–15.0)
Hemoglobin: 9 g/dL — ABNORMAL LOW (ref 12.0–15.0)
MCH: 29.8 pg (ref 26.0–34.0)
MCHC: 33.6 g/dL (ref 30.0–36.0)
MCV: 88.7 fL (ref 78.0–100.0)
Platelets: 273 10*3/uL (ref 150–400)
Platelets: 266 10*3/uL (ref 150–400)
RBC: 3.02 MIL/uL — ABNORMAL LOW (ref 3.87–5.11)
RBC: 3.06 MIL/uL — ABNORMAL LOW (ref 3.87–5.11)
RDW: 15.3 % (ref 11.5–15.5)
WBC: 10.3 10*3/uL (ref 4.0–10.5)
WBC: 10.2 10*3/uL (ref 4.0–10.5)

## 2011-03-22 LAB — GLUCOSE, CAPILLARY
Glucose-Capillary: 141 mg/dL — ABNORMAL HIGH (ref 70–99)
Glucose-Capillary: 213 mg/dL — ABNORMAL HIGH (ref 70–99)
Glucose-Capillary: 166 mg/dL — ABNORMAL HIGH (ref 70–99)
Glucose-Capillary: 147 mg/dL — ABNORMAL HIGH (ref 70–99)

## 2011-03-22 LAB — PREPARE FRESH FROZEN PLASMA: Unit division: 0

## 2011-03-22 LAB — PROTIME-INR: Prothrombin Time: 20.1 seconds — ABNORMAL HIGH (ref 11.6–15.2)

## 2011-03-22 LAB — KAPPA/LAMBDA LIGHT CHAINS
Kappa free light chain: 5.35 mg/dL — ABNORMAL HIGH (ref 0.33–1.94)
Kappa, lambda light chain ratio: 1.6 (ref 0.26–1.65)
Lambda free light chains: 3.34 mg/dL — ABNORMAL HIGH (ref 0.57–2.63)

## 2011-03-23 ENCOUNTER — Encounter: Payer: Medicare Other | Admitting: *Deleted

## 2011-03-23 LAB — CBC
Hemoglobin: 8.6 g/dL — ABNORMAL LOW (ref 12.0–15.0)
MCHC: 32.5 g/dL (ref 30.0–36.0)
Platelets: 287 10*3/uL (ref 150–400)
RBC: 2.94 MIL/uL — ABNORMAL LOW (ref 3.87–5.11)
RDW: 15.3 % (ref 11.5–15.5)
WBC: 10.2 10*3/uL (ref 4.0–10.5)
WBC: 10 10*3/uL (ref 4.0–10.5)

## 2011-03-23 LAB — DIFFERENTIAL
Basophils Absolute: 0 10*3/uL (ref 0.0–0.1)
Basophils Relative: 0 % (ref 0–1)
Lymphocytes Relative: 9 % — ABNORMAL LOW (ref 12–46)
Neutro Abs: 8.8 10*3/uL — ABNORMAL HIGH (ref 1.7–7.7)
Neutrophils Relative %: 86 % — ABNORMAL HIGH (ref 43–77)

## 2011-03-23 LAB — BASIC METABOLIC PANEL
CO2: 34 mEq/L — ABNORMAL HIGH (ref 19–32)
Chloride: 99 mEq/L (ref 96–112)
Creatinine, Ser: 1.18 mg/dL — ABNORMAL HIGH (ref 0.50–1.10)
Potassium: 3.6 mEq/L (ref 3.5–5.1)
Sodium: 139 mEq/L (ref 135–145)

## 2011-03-23 LAB — GLUCOSE, CAPILLARY
Glucose-Capillary: 148 mg/dL — ABNORMAL HIGH (ref 70–99)
Glucose-Capillary: 198 mg/dL — ABNORMAL HIGH (ref 70–99)
Glucose-Capillary: 162 mg/dL — ABNORMAL HIGH (ref 70–99)

## 2011-03-23 LAB — PROTIME-INR
INR: 1.62 — ABNORMAL HIGH (ref 0.00–1.49)
Prothrombin Time: 19.5 seconds — ABNORMAL HIGH (ref 11.6–15.2)

## 2011-03-24 LAB — CBC
MCH: 30.2 pg (ref 26.0–34.0)
MCHC: 33.1 g/dL (ref 30.0–36.0)
RDW: 15.2 % (ref 11.5–15.5)

## 2011-03-24 LAB — PROTIME-INR
INR: 1.69 — ABNORMAL HIGH (ref 0.00–1.49)
Prothrombin Time: 20.2 seconds — ABNORMAL HIGH (ref 11.6–15.2)

## 2011-03-24 LAB — GLUCOSE, CAPILLARY: Glucose-Capillary: 140 mg/dL — ABNORMAL HIGH (ref 70–99)

## 2011-03-29 NOTE — Discharge Summary (Signed)
NAME:  [name], [name]                   ACCOUNT NO.:  [id number]  MEDICAL RECORD NO.:  [id number]  LOCATION:  3707                         FACILITY:  [facility]  PHYSICIAN:  Pleas Koch, MD        DATE OF BIRTH:  1932-01-12  DATE OF ADMISSION:  03/17/2011 DATE OF DISCHARGE:  03/24/2011                              DISCHARGE SUMMARY   DISCHARGE DIAGNOSES: 1. Left-sided iliacus hematoma with concomitant hip pain. 2. Leukocytosis - no fever - unlikely secondary to infectious     etiology. 3. Cardiology.     a.     Atrial fibrillation, rate controlled, highest 120.  Cardizem      and labetalol have been adjusted in the hospital.     b.     Congestive heart failure with acute compensation.  Ejection      fraction 50-60% done per catheterization, April 2012 - resolved on      higher doses of Lasix. 4. Hypertension, moderately well-controlled. 5. Diabetes mellitus - well-controlled in hospital on oral medications     as an outpatient. 6. Hyperlipidemia.  Last LDL 76 on October 15, 2010.  Last total     cholesterol 157 at that time. 7. Hip pain likely secondary to left-sided iliacus hematoma. 8. Constipation which is chronic on multiple agents and therapy. 9. Acute kidney injury and hypokalemia, resolved.  She does have an     element of chronic kidney disease stage I-II. 10.?  Myeloma - unfounded diagnosis given most of her workup has been     negative.  Specifically, SPEP was interpreted as being nonspecific     pattern - sometimes associated with acute intimated response     pattern.     a.     Kappa chains and lambda chains were elevated but this was      again thought to be nonspecific.  DISCHARGE MEDICATIONS:  The patient has been instructed to stop WARFARIN for the time being.  The patient will started on aspirin 81 mg.  The patient to continue the following meds. 1. Citalopram 20 mg 1 tablet daily. 2. Xanax 0.25 one tablet b.i.d. p.r.n. 3. Labetalol 300 mg 1.5 b.i.d. 4. Metformin 500  one tablet daily. 5. Hydralazine 50 one tablet t.i.d. 6. Lovastatin 40 mg 2 tablets daily (this should be determined with     help of geriatrician - max dose statin is associated with more     myalgias on regular dose). 7. Lasix 60 mg daily. 8. Hydrocodone/APAP 5/325 one tablet q.8 p.r.n. 9. Levothyroxine 25 mcg 1 tablet daily. 10.Vitamin D3 1000 units 1 tablet daily.  New medications that have been placed are: 1. Lidoderm patch, 1 patch q.24 hourly to remove after 24 hours. 2. Colace 100 mg b.i.d. 3. Senokot-S 2 tablets daily p.r.n. 4. Sorbitol 70% 25 mL p.r.n. (hold for diarrhea). 5. Insulin aspart 2-5 units at bedtime. 6. Insulin FlexPen t.i.d. with meals. 7. Sensitive sliding scale based on sensitivity factor. 8. Aspirin 81 mg daily (special instructions:  The patient had a bleed     on Coumadin into her left iliacus muscle.  It was a 6.8 x 4.8 cm  bleed.  She will need to have a frank discussion regarding use of     Coumadin for chronic AFib in the future with Cardiology.  As such,     I am placing her on antiplatelet therapy with aspirin for the time     being at 81 mg.  Please arrange this consult as an outpatient).  PERTINENT IMAGING STUDIES: 1. Chest x-ray, two-view, March 17, 2011 showed:     a.     Stable cardiomegaly with mild pulmonary vascular congestion.     b.     Decrease in residual, tiny right pleural effusion.     c.     Minimal linear atelectasis, left lower lobe. 2. Pelvis, two-view x-ray done March 17, 2011 showed:     a.     Multiple osseous lucencies involving lateral femoral heads      bilaterally as well as intertrochanteric region of the right      femur.  These raised questions for metastatic disease versus      myeloma.     b.     Mild degenerative change in lower spine and SI joints.     c.     No acute abnormality. 3. Bone survey done on March 18, 2011 showed no aggressive     osteolytic lesions are identified.  Previously seen  lesions in the     proximal femoral head are due to combination of artifactual lucency     associated with overlapping soft tissues and mild osteopenia. 4. CT abdomen and pelvis with contrast, March 20, 2011 showed:     a.     Left-sided iliacus hematoma.     b.     Size of this was 6.4 x 4.8 cm extending above from about      pelvic brim to the level of left acetabulum.     c.     Degraded exam secondary to related factors.     d.     Right-sided renal calculi.     e.     Cholelithiasis.     f.     Cardiomegaly with small bilateral pleural effusions. 5. CT hip, right, March 20, 2011 showed:     a.     No acute osseous abnormality.     b.     Severe osteoarthritis.     c.     No evidence of joint effusion or hemarthrosis. 6. CT, left hip, done showed:     a.     No acute osseous abnormalities.     b.     Mild osteoarthritis.     c.     No evidence joint effusion or hemarthrosis.     d.     Left iliacus muscle hematoma as noted on the concurrent CT.  PERTINENT CONSULTATIONS ON THIS CASE:  None.  HISTORY OF PRESENT ILLNESS:  [name] is a 77 year old pleasant female with above medical complaints who complained over the past 1 week prior to admission of lower extremity swelling and pain going upper thighs. She reports no salty indiscretion and pain with hip was so bad she had difficulty walking.  She had an episode of increased shortness of breath last p.m. that resolved on its own but no frank fevers and came to emergency room where her vitals were totally normal.  She was found to have stable renal function, negative EKG, negative troponin, and was admitted for what looked like multiple myeloma lesions that were consistent  on pelvis x-ray.  PERTINENT POSITIVES ON ADMISSION:  VITAL SIGNS:  Blood pressure 132/91, pulse 97, respirations 98.2, and 100% on room air. GENERAL:  She was uncomfortable due to pain in her left hip. NECK:  Dilated, grossly distended, external jugular  veins with venous palpations up to her earlobes.  Hepatojugular reflux as well. HEART:  Irregularly irregular, tachycardic, S3.  No apparent murmurs. EXTREMITIES:  The right knee appeared slightly swollen.  Left hip was not tender to palpation on admission.  PERTINENT POSITIVES PER LABORATORY DATA:  WBC 10.1, no diff, hematocrit 27.6 down from prior of 33.6 in April 2012and , platelets 341.  BUN to creatinine 23/1.2.  HOSPITAL COURSE ACCORDING TO ISSUE: 1. Question myeloma.  The patient actually had a workup for the same     and then had a CT of the abdomen when her pain was not resolving     and there was concomitant drop in her hematocrit further.  This was     associated with an unexplained rise in her INR from admission INR     of 5.14-7.36.  She dropped all the way down to 7.3 hemoglobin and     was then subsequently given 4 units of FFP and 2 units of packed     red blood cells.  The patient was transferred to the Step-Down Unit     in view of the need for close monitoring while transfusion was     given and was reviewed with regards to the same.  Her hemoglobin     has remained normal.  She has not spiked a fever and this is not a     septic hematoma.  Hence, the need for trauma surgery was necessary.     She is going to go home on low-dose aspirin 81 mg for some     antiplatelet protection given the fact that she has AFib.  She will     need a frank full discussion with her cardiologist as an outpatient     for further management.  Her SPEP and UPEP were not consistent with     any acute findings. 2. Leukocytosis.  The patient had a total of 6 units of blood products     transfused and this is likely the source.  She has had a low grade     99.2 fever in the hospital with no evidence of the source and no     real fever.  She is eating well, she is not coughing, her chest is     completely clear, her abdomen is soft, and in fact the pain in her     left hip is much better.  I  would not pursue further workup of the     same. 3. Cardiology.     a.     CHF acute decompensation status post Lasix.  She developed a      slight contraction alkalosis after being aggressively diuresed.      She will go home on her regular dose of 60 mg of Lasix q.a.m. -      the patient will need a  BMET in 3-4 days and daily weights. 4. AFib - not initially rate controlled.  Her rate went up as high as     the 130s.  She was uptitrated on her Cardizem from 120-240 and her     rates have come down to the 100s.  She is on labetalol and we will  continue the same. 5. Hypertension.  This is well controlled. 6. Diabetes mellitus. 7. Hyperlipidemia.  She will continue her pravastatin. 8. Constipation.  The patient will continue bowel regimen as detailed     above. 9. Depression.  She will continue her Celexa 20.  I have discussed on numerous occasions with her niece Bonita Quin that the patient will likely need SNF for further workup and management.  She is still at baseline relatively weak and nonmobile and physical therapy has most appropriately treated in the nursing home center.  The patient is understanding of these concerns and is willing to do short-term placement.  The patient was seen on day of discharge and was doing well, was having breakfast and was talking with her occupational therapist.  DISCHARGE PHYSICAL EXAMINATION:  VITAL SIGNS:  Temperature 98.1, pulse 92-109, respirations 18, blood pressure 126-144/81-77, O2 sats 99% on room air. CHEST:  Clinically clear. HEART:  S1-S2.  No murmurs, rubs, or gallops.  No arcus.  Mild JVD. ABDOMEN:  Soft, nontender, and nondistended. EXTREMITIES:  The patient still had some left hip pain but it was less than prior.  The patient is deemed stable and fit for discharge and was discharged to nursing facility.  TIME SPENT:  I spent over 45 minutes in time coordinating her care and discharge.           ______________________________ Pleas Koch, MD     JS/MEDQ  D:  03/24/2011  T:  03/24/2011  Job:  [id number]  cc:   Rollene Rotunda, MD, [facility] Corwin Levins, MD  Electronically Signed by Pleas Koch MD on 03/29/2011 04:14:32 PM

## 2011-03-31 NOTE — H&P (Signed)
NAME:  [name], [name]                   ACCOUNT NO.:  [id number]  MEDICAL RECORD NO.:  [id number]  LOCATION:  [facility]                         FACILITY:  [facility]  PHYSICIAN:  Carlota Raspberry, MD         DATE OF BIRTH:  1932/03/22  DATE OF ADMISSION:  03/17/2011 DATE OF DISCHARGE:                             HISTORY & PHYSICAL   PRIMARY CARE PHYSICIAN:  Corwin Levins, MD with [facility].  CARDIOLOGY:  Rollene Rotunda, MD, [facility]  CHIEF COMPLAINT:  Bilateral lower extremity swelling and left hip pain.  HISTORY OF PRESENT ILLNESS:  This is a 77 year old female with a history of AFib on Coumadin, diabetes, hypertension, pulmonary hypertension, CKD II-III, who presented to the emergency room with bilateral lower extremity swelling and some left hip pain.  The patient and her niece report that for the past 1 week, she has been having worsening lower extremity swelling and pain that is going up to her thighs.  She reports no salty food her dietary indiscretion and she has been taking her Lasix 60 mg daily as prescribed with plenty of urine output.  They also report that the swelling is worse on the right side and on the right thigh and that she has also been having some left hip pain for a while that was thought to be related to the swelling.  The pain in her hip is so bad that she has been having difficulty walking.  She also had an episode of increased shortness of breath last night that resolved on its own associated with some sweating but she has had no frank fevers and no chest pain or other anginal type symptoms.  She came to the emergency room where initial vital signs were 98.2, blood pressure 132/91, pulse 97, 16, and 100% on room air.  Through her workup in the emergency room, she was found to have stable renal function and negative troponin, negative EKG.  Chest x-ray with stable cardiomegaly and some pulmonary vascular congestion, decreased right tiny pleural effusion but most importantly they  did a pelvic plain film which showed multiple osseous lucencies involving the lateral femoral heads bilaterally as well as the intertrochanteric region of the right femur that was concerning for metastatic disease versus myeloma.  She is being admitted for management of lower extremity edema and these concerning lesions.  In discussion with the patient, she has denied any fevers, chills, or night sweats but does endorse low energy and has had low energy for quite a long time that may be related to the lower extremity swelling. She endorses decreased appetite, but at baseline she does not have shortness of breath or chest pain.  She has no abdominal pain.  She has noted no blood loss anywhere to explain her hematocrit drop also noted in the ED.  REVIEW OF SYSTEMS:  Otherwise, not mentioned or negative.  PAST MEDICAL HISTORY: 1. Pulmonary hypertension with last pulmonary pressure noted to be 51     on the last echo with dilated RV and RA with preserved systolic     function and EF 55-60%. 2. Chronic right heart failure secondary to pulmonary hypertension. 3. Chronic bilateral  leg lower extremity edema. 4. Hypertension. 5. Diabetes type 2 on metformin. 6. CKD 2-3. 7. Atrial fibrillation, on Coumadin. 8. Hypothyroidism.  MEDICATIONS:  Medication list is reconciled with the patient's home list and includes: 1. Alprazolam 0.25 b.i.d. p.r.n. 2. Cholecalciferol 1000 units daily. 3. Celexa 20 mg daily. 4. Diltiazem 120 mg daily. 5. Lasix 60 mg daily. 6. Hydralazine 50 mcg t.i.d. 7. Hydrocodone and acetaminophen. 8. Labetalol 450 mcg b.i.d. 9. Levothyroxine 25 mcg daily. 10.Lovastatin 80 mg daily. 11.Metformin 500 mg daily with breakfast. 12.Coumadin 5 mg as directed.  ALLERGIES:  She has allergy to CLONIDINE.  SOCIAL HISTORY:  She lives at home with her niece Bonita Quin, is minimally ambulatory.  She is a never smoker, does not drink, or do any drugs.  FAMILY HISTORY:   Noncontributory.  PHYSICAL EXAMINATION:  VITAL SIGNS:  Blood pressure 132/91, pulse 97, temperature 98.2, respiratory 16.  She is 100% on room air. GENERAL:  She is an average-sized very elderly lady who appears uncomfortable in the ED stretcher due to pain in her left hip. HEENT:  Her pupils are equal and round.  Her extraocular muscles are intact.  Her sclerae are clear.  Her mouth is fairly normal appearing. She has poor dentition with some teeth missing. NECK:  Very dilated and grossly distended external jugular veins with very obvious venous pulsations up around her ear lobes.  There is some hepatojugular reflux as well too. LUNGS:  Clear to auscultation bilaterally with no wheezes, crackles, rhonchi or other adventitious lung sounds appreciated. HEART:  Grossly irregular and slightly tachycardic.  She appears to have an S3 noted along her precordium.  There are no apparent murmurs though. ABDOMEN:  A bit obese but is soft, nontender, nondistended, and overall unimpressive. EXTREMITIES:  Warm without any coolness or cyanosis at the tips of her toes.  She does have very gross soft pitting edema in her bilateral lower extremities going all the way up into her thighs.  There are no chronic venous stasis hyperpigmentation changes though.  Her right thigh does appear a bit larger than the left but frankly with the size of them it is difficult to really tell.  The right knee does appear slightly swollen compared to the left but it is difficult to tell if it is an effusion or just gross swelling in general.  Her left hip is not tender to palpation.  There are no gross abnormalities, swelling, or deformities and it is not particularly an impressive exam. NEUROLOGIC:  She is alert.  She is conversant, able to answer questions. She is moving her extremities spontaneously and there are no gross focal neurological deficits noted.  LABORATORY WORK:  Her white blood cell count is 10.1 with  no differential.  Hematocrit is 27.6 this is down from prior 33.6 in April 2012.  Platelets 341, INR is 4.8.  Chemistry is normal including renal 23/1.2 at its baseline.  Troponin is 0.01.  Calcium is 10.0.  Radiography or pelvic plain film shows multiple osseous lucencies involving the lateral femoral heads bilaterally as well as intertrochanteric region of the right femur concerning for metastatic disease versus myeloma.  Multiple degenerative changes in her lower lumbar spine and SI joints.  Chest x-ray shows stable cardiomegaly with mild pulmonary vascular congestion, decrease in tiny right pleural effusion and linear atelectasis of the left lower lobe.  EKG shows atrial fibrillation with no apparent P-waves.  QRS is are 116 msec.  She has a frank right bundle-branch block.  She  has diffuse T- wave flattening or inversions but it is overall unchanged compared to prior in March 2012.  She does not overall appear acutely ischemic  IMPRESSION:  This is a 77 year old female with a history of pulmonary hypertension and chronic right heart failure, atrial fibrillation on Coumadin, type 2 diabetes/hypertension/hyperlipidemia, chronic kidney disease II-III, who presents with bilateral lower extremity swelling and left hip pain and found to have bilateral osseus lesions concerning for multiple myeloma versus metastatic disease.  1. Lower extremity edema.  This appears to be an acute on chronic     right heart failure exacerbation likely due to known pulmonary     hypertension.  Her physical exam shows right-sided volume overload     as her lungs appear quite clear by exam.  She has had no increased     salt intake or medication noncompliance and I would suspect that     she may be under-diuresed with her home 60 mg of Lasix.  She does     not appear ischemic at present with a negative troponin,     unimpressive EKG. We will give her 60 mg of IV Lasix b.i.d. and depending on her  urine output may need to increase this but we will start with this dosage.  We should continue to get daily weights.  We will get a UA to rule out infection, but her ROS for infections is currently negative.  We will also continue her diltiazem, hydralazine, and labetalol. 1. Bilateral femoral head lesions.  She does not have acute renal     failure, but does appear to have a new acute anemia down from her     most recent baseline concerning for multiple myeloma and I will     work this up with an SPEP, UPEP, total protein, albumin LFTs, LDH     beta-2 microglobulin, CRP, skeletal survey, and a UA for protein.     She will also need a PT/OT consult for the left hip pain, which we     will control with Tylenol and IV morphine.  If this comes up     negative for myeloma then search for a primary cancer would need to     be undertaken 2. Atrial fibrillation, on Coumadin.  She is currently     supratherapeutic on her Coumadin which we will hold, trend daily     coags, and resume when appropriate. 3. Diabetes.  We will hold home metformin and treat her with insulin     sliding scale while admitted. 4. Home medication reconciliation.  Continue her home levothyroxine,     lovastatin, and Celexa but hold Xanax and vitamin D and Vicodin     while she is admitted. 5. Fluids, electrolytes, nutrition.  She will get no IV fluids.  We     will put her for a heart healthy diabetic diet. 6. Prophylaxis.  Subcutaneous heparin, Colace senna, Tylenol, and     morphine.  CODE STATUS:  Presumed full.  The patient will be admitted to [facility] Team 5 to a regular bed.          ______________________________ Carlota Raspberry, MD     EB/MEDQ  D:  03/17/2011  T:  03/18/2011  Job:  [id number]  Electronically Signed by Carlota Raspberry MD on 03/31/2011 12:21:41 PM

## 2011-04-03 ENCOUNTER — Emergency Department (HOSPITAL_COMMUNITY): Payer: Medicare Other

## 2011-04-03 ENCOUNTER — Inpatient Hospital Stay (HOSPITAL_COMMUNITY)
Admission: EM | Admit: 2011-04-03 | Discharge: 2011-04-06 | DRG: 309 | Disposition: A | Discharge status: Skilled Nursing Facility | Payer: Medicare Other | Attending: Internal Medicine | Admitting: Internal Medicine

## 2011-04-03 DIAGNOSIS — I509 Heart failure, unspecified: Secondary | ICD-10-CM | POA: Diagnosis present

## 2011-04-03 DIAGNOSIS — K5900 Constipation, unspecified: Secondary | ICD-10-CM | POA: Diagnosis present

## 2011-04-03 DIAGNOSIS — Z79899 Other long term (current) drug therapy: Secondary | ICD-10-CM

## 2011-04-03 DIAGNOSIS — E785 Hyperlipidemia, unspecified: Secondary | ICD-10-CM | POA: Diagnosis present

## 2011-04-03 DIAGNOSIS — N179 Acute kidney failure, unspecified: Secondary | ICD-10-CM | POA: Diagnosis present

## 2011-04-03 DIAGNOSIS — Z7982 Long term (current) use of aspirin: Secondary | ICD-10-CM

## 2011-04-03 DIAGNOSIS — I5032 Chronic diastolic (congestive) heart failure: Secondary | ICD-10-CM | POA: Diagnosis present

## 2011-04-03 DIAGNOSIS — I129 Hypertensive chronic kidney disease with stage 1 through stage 4 chronic kidney disease, or unspecified chronic kidney disease: Secondary | ICD-10-CM | POA: Diagnosis present

## 2011-04-03 DIAGNOSIS — E119 Type 2 diabetes mellitus without complications: Secondary | ICD-10-CM | POA: Diagnosis present

## 2011-04-03 DIAGNOSIS — I4891 Unspecified atrial fibrillation: Principal | ICD-10-CM | POA: Diagnosis present

## 2011-04-03 DIAGNOSIS — N182 Chronic kidney disease, stage 2 (mild): Secondary | ICD-10-CM | POA: Diagnosis present

## 2011-04-03 DIAGNOSIS — B961 Klebsiella pneumoniae [K. pneumoniae] as the cause of diseases classified elsewhere: Secondary | ICD-10-CM | POA: Diagnosis present

## 2011-04-03 DIAGNOSIS — E039 Hypothyroidism, unspecified: Secondary | ICD-10-CM | POA: Diagnosis present

## 2011-04-03 DIAGNOSIS — D649 Anemia, unspecified: Secondary | ICD-10-CM | POA: Diagnosis present

## 2011-04-03 DIAGNOSIS — N390 Urinary tract infection, site not specified: Secondary | ICD-10-CM | POA: Diagnosis present

## 2011-04-03 DIAGNOSIS — I517 Cardiomegaly: Secondary | ICD-10-CM | POA: Diagnosis present

## 2011-04-03 DIAGNOSIS — Z794 Long term (current) use of insulin: Secondary | ICD-10-CM

## 2011-04-03 LAB — URINE MICROSCOPIC-ADD ON

## 2011-04-03 LAB — CBC
HCT: 27.9 % — ABNORMAL LOW (ref 36.0–46.0)
Hemoglobin: 9.2 g/dL — ABNORMAL LOW (ref 12.0–15.0)
MCV: 91.5 fL (ref 78.0–100.0)
RDW: 16 % — ABNORMAL HIGH (ref 11.5–15.5)
WBC: 8.6 10*3/uL (ref 4.0–10.5)

## 2011-04-03 LAB — DIFFERENTIAL
Basophils Absolute: 0 10*3/uL (ref 0.0–0.1)
Eosinophils Relative: 0 % (ref 0–5)
Lymphocytes Relative: 8 % — ABNORMAL LOW (ref 12–46)
Lymphs Abs: 0.7 10*3/uL (ref 0.7–4.0)
Monocytes Absolute: 0.4 10*3/uL (ref 0.1–1.0)
Neutro Abs: 7.6 10*3/uL (ref 1.7–7.7)

## 2011-04-03 LAB — URINALYSIS, ROUTINE W REFLEX MICROSCOPIC
Bilirubin Urine: NEGATIVE
Nitrite: NEGATIVE
Specific Gravity, Urine: 1.01 (ref 1.005–1.030)
Urobilinogen, UA: 1 mg/dL (ref 0.0–1.0)
pH: 6 (ref 5.0–8.0)

## 2011-04-03 LAB — COMPREHENSIVE METABOLIC PANEL
ALT: 14 U/L (ref 0–35)
BUN: 33 mg/dL — ABNORMAL HIGH (ref 6–23)
CO2: 24 mEq/L (ref 19–32)
Calcium: 9.9 mg/dL (ref 8.4–10.5)
Creatinine, Ser: 1.34 mg/dL — ABNORMAL HIGH (ref 0.50–1.10)
GFR calc Af Amer: 43 mL/min — ABNORMAL LOW (ref >90)
GFR calc non Af Amer: 37 mL/min — ABNORMAL LOW (ref >90)
Glucose, Bld: 128 mg/dL — ABNORMAL HIGH (ref 70–99)
Sodium: 137 mEq/L (ref 135–145)
Total Protein: 6.5 g/dL (ref 6.0–8.3)

## 2011-04-03 LAB — POCT I-STAT TROPONIN I

## 2011-04-03 LAB — D-DIMER, QUANTITATIVE: D-Dimer, Quant: 3.08 ug/mL-FEU — ABNORMAL HIGH (ref 0.00–0.48)

## 2011-04-03 LAB — APTT: aPTT: 31 seconds (ref 24–37)

## 2011-04-03 LAB — PRO B NATRIURETIC PEPTIDE: Pro B Natriuretic peptide (BNP): 7619 pg/mL — ABNORMAL HIGH (ref 0–450)

## 2011-04-03 LAB — GLUCOSE, CAPILLARY: Glucose-Capillary: 112 mg/dL — ABNORMAL HIGH (ref 70–99)

## 2011-04-03 LAB — CK TOTAL AND CKMB (NOT AT ARMC)
CK, MB: 2.6 ng/mL (ref 0.3–4.0)
Total CK: 145 U/L (ref 7–177)

## 2011-04-03 LAB — PROTIME-INR: INR: 1.19 (ref 0.00–1.49)

## 2011-04-04 ENCOUNTER — Inpatient Hospital Stay (HOSPITAL_COMMUNITY): Payer: Medicare Other

## 2011-04-04 DIAGNOSIS — M7989 Other specified soft tissue disorders: Secondary | ICD-10-CM

## 2011-04-04 DIAGNOSIS — M79609 Pain in unspecified limb: Secondary | ICD-10-CM

## 2011-04-04 LAB — GLUCOSE, CAPILLARY: Glucose-Capillary: 151 mg/dL — ABNORMAL HIGH (ref 70–99)

## 2011-04-04 LAB — MAGNESIUM: Magnesium: 2.1 mg/dL (ref 1.5–2.5)

## 2011-04-04 LAB — CBC
HCT: 26.8 % — ABNORMAL LOW (ref 36.0–46.0)
MCHC: 32.5 g/dL (ref 30.0–36.0)
MCV: 91.5 fL (ref 78.0–100.0)
Platelets: 234 10*3/uL (ref 150–400)
RDW: 16.1 % — ABNORMAL HIGH (ref 11.5–15.5)

## 2011-04-04 LAB — CARDIAC PANEL(CRET KIN+CKTOT+MB+TROPI)
CK, MB: 2.5 ng/mL (ref 0.3–4.0)
Troponin I: <0.3 ng/mL (ref <0.30)

## 2011-04-04 LAB — BASIC METABOLIC PANEL
BUN: 34 mg/dL — ABNORMAL HIGH (ref 6–23)
Creatinine, Ser: 1.29 mg/dL — ABNORMAL HIGH (ref 0.50–1.10)
GFR calc Af Amer: 45 mL/min — ABNORMAL LOW (ref >90)
GFR calc non Af Amer: 39 mL/min — ABNORMAL LOW (ref >90)
Potassium: 3.4 mEq/L — ABNORMAL LOW (ref 3.5–5.1)

## 2011-04-04 MED ORDER — TECHNETIUM TO 99M ALBUMIN AGGREGATED
6.0000 | Freq: Once | INTRAVENOUS | Status: AC | PRN
  Administered 2011-04-04: 6 via INTRAVENOUS

## 2011-04-04 MED ORDER — XENON XE 133 GAS
10.0000 | GAS_FOR_INHALATION | Freq: Once | RESPIRATORY_TRACT | Status: AC | PRN
  Administered 2011-04-04: 10 via RESPIRATORY_TRACT

## 2011-04-05 LAB — CBC
HCT: 25 % — ABNORMAL LOW (ref 36.0–46.0)
Hemoglobin: 8.3 g/dL — ABNORMAL LOW (ref 12.0–15.0)
MCH: 30.7 pg (ref 26.0–34.0)
MCV: 92.6 fL (ref 78.0–100.0)
RBC: 2.7 MIL/uL — ABNORMAL LOW (ref 3.87–5.11)

## 2011-04-05 LAB — BASIC METABOLIC PANEL
CO2: 28 mEq/L (ref 19–32)
Calcium: 9.9 mg/dL (ref 8.4–10.5)
Glucose, Bld: 121 mg/dL — ABNORMAL HIGH (ref 70–99)
Sodium: 143 mEq/L (ref 135–145)

## 2011-04-05 LAB — GLUCOSE, CAPILLARY: Glucose-Capillary: 181 mg/dL — ABNORMAL HIGH (ref 70–99)

## 2011-04-05 LAB — URINE CULTURE
Colony Count: >=100000
Culture  Setup Time: 201210062140

## 2011-04-06 LAB — GLUCOSE, CAPILLARY: Glucose-Capillary: 136 mg/dL — ABNORMAL HIGH (ref 70–99)

## 2011-04-06 LAB — BASIC METABOLIC PANEL
CO2: 26 mEq/L (ref 19–32)
Calcium: 9.8 mg/dL (ref 8.4–10.5)
GFR calc non Af Amer: 37 mL/min — ABNORMAL LOW (ref >90)
Sodium: 141 mEq/L (ref 135–145)

## 2011-04-06 LAB — MAGNESIUM: Magnesium: 2.2 mg/dL (ref 1.5–2.5)

## 2011-04-08 NOTE — Discharge Summary (Signed)
NAME:  [name], [name]                   ACCOUNT NO.:  [id number]  MEDICAL RECORD NO.:  [id number]  LOCATION:  3732                         FACILITY:  [facility]  PHYSICIAN:  Manson Passey, MD        DATE OF BIRTH:  23-Jul-1931  DATE OF ADMISSION:  04/03/2011 DATE OF DISCHARGE:  04/06/2011                              DISCHARGE SUMMARY   PRIMARY CARE PHYSICIAN:  Dr. Oliver Barre with [facility].  DISCHARGE DIAGNOSES: 1. Atrial fibrillation with rapid ventricular rate. 2. Diastolic heart failure with ejection fraction of 50-60% based on     recent echo in April, 2012. 3. Hypertension. 4. Diabetes. 5. Dyslipidemia. 6. Constipation. 7. Acute kidney injury. 8. Anemia. 9. Questionable history of myeloma.  DISCHARGE MEDICATIONS: 1. Aspirin 81 mg daily. 2. Citalopram 20 mg daily. 3. Diltiazem 240 mg daily. 4. Colace 100 mg twice daily. 5. Lasix 40 mg one and a half tab daily. 6. Hydralazine 50 mg 3 times a day. 7. Hydrocodone/APAP 5/325 mg 1 tablet every 8 hours as needed. 8. Labetalol 300 mg twice daily. 9. Levothyroxine 25 mcg daily. 10.Lidoderm transdermal patch every 24 hours. 11.Lovastatin 40 mg daily. 12.Metformin 500 mg daily. 13.NovoLog FlexPen per sliding scale insulin 3 times a day with meals     as needed. 14.Senna 8.6 mg 2 tablets daily as needed. 15.Sorbitol 70% solution 25 mL daily as needed. 16.Vitamin D3 1000 units over-the-counter daily. 17.Xanax 0.5 mg daily twice as needed.  CONSULTS:  None.  DIAGNOSTIC STUDIES: 1. V/Q scan April 04, 2011, which showed very low probability for     pulmonary embolism less than 10%. 2. Chest x-ray April 03, 2011, which shows cardiomegaly and no acute     findings or active disease.  DISCHARGE LABORATORIES:  Sodium 141, potassium 3.5, chloride 105, bicarb 26, BUN 31, creatinine 1.33, glucose 126, calcium 9.8, white blood cells 6.4, hemoglobin 8.3, hematocrit 25, platelet count 217, magnesium 2.2. Urine culture on April 03, 2011,  grew Klebsiella species.  A1c 6.6. TSH 2.124.  Two sets of cardiac enzymes negative.  BNP 7619.  HISTORY OF PRESENT ILLNESS:  The patient is a 77 year old female from skilled nursing facility with multiple comorbid conditions including atrial fibrillation, diastolic heart failure, hypertension, diabetes and questionable history of myeloma who presented to emergency room with complaints of generalized weakness.  The patient reported that she has sustained bruises to her right upper extremities few days prior to admission and had complaints of right arm pain as well as generalized weakness.  The patient denies fever, chills, rigors.  On admission to the emergency room, the patient was in atrial fibrillation with rapid ventricular rate.  The patient has no history of AFib, but at this time the patient has no complaints of chest pain, shortness of breath, dizziness or lightheadedness, no loss of consciousness.  The patient has no complaints of abdominal pain, nausea,vomiting, diarrhea, or constipation.  No blood in stool or urine.  PHYSICAL EXAM:  VITAL SIGNS:  Blood pressure 132/72, pulse 67, respirations 20, temperature 98.6 Fahrenheit, oxygen saturation 100% on room air. GENERAL APPEARANCE:  No acute distress, the patient appears comfortable. HEENT:  Normocephalic/atraumatic, pupils equally  round, reactive to light and accommodation, extraocular muscles intact, no tonsillar erythema or exudate. NECK:  Supple, no lymphadenopathy. SKIN:  Warm, dry. LUNGS:  Clear to auscultation bilaterally, no wheezing, no rales, no rhonchi. CARDIOVASCULAR:  Positive S1, S2, irregular rhythm but rate controlled. ABDOMEN:  Positive bowel sounds, soft, nontender/nondistended. EXTREMITIES:  Pulses palpable; trace lower extremity edema. NEUROLOGIC:  The patient has no focal neurological deficits.  HOSPITAL COURSE BY PROBLEM: 1. Atrial fibrillation with rapid ventricular rate.  The patient     currently  has heart rate of 67, so can continue taking her home     medication diltiazem 240 mg daily.  While in the hospital, the     patient required a Cardizem drip which was tapered off and the     patient was switched to oral medication, home regimen, which she     tolerated very well. 2. Diastolic heart failure with ejection fraction of 50-60% from 2D     echo April, 2012.  The patient can go home with a home medication     regimen of Lasix 40 mg daily, hydralazine 50 mg q.8 hours,.  Please     follow up with the primary care physician in regards to whether     there should be any change in the regimen of the current home     medications. 3. Hypertension, throughout this hospitalization, blood pressure was     well controlled.  The patient can resume labetalol 300 mg twice a     day as a home medication. 4. Urinary tract infection.  The patient was treated with ceftriaxone     for 3 days for urine culture that grew Klebsiella in the urine.  We     will stop antibiotics on discharge. 5. Dyslipidemia, please continue simvastatin 40 mg at bedtime. 6. Hypothyroidism, please continue levothyroxine 25 mcg daily.  The     patient is medically stable and clinically appears well for     discharge.  The patient will be discharged to skilled nursing     facility.  Time spent discharging the patient more than 35 minutes.          ______________________________ Manson Passey, MD     AD/MEDQ  D:  04/06/2011  T:  04/06/2011  Job:  [id number]  Electronically Signed by Manson Passey MD on 04/08/2011 09:42:13 AM

## 2011-04-16 ENCOUNTER — Ambulatory Visit: Payer: Medicare Other | Admitting: Internal Medicine

## 2011-04-28 NOTE — H&P (Signed)
NAME:  Roughton, Kearie                   ACCOUNT NO.:  [id number]  MEDICAL RECORD NO.:  [id number]  LOCATION:  [facility]                         FACILITY:  [facility]  PHYSICIAN:  Richarda Overlie, MD       DATE OF BIRTH:  1931-07-13  DATE OF ADMISSION:  04/03/2011 DATE OF DISCHARGE:                             HISTORY & PHYSICAL   PRIMARY CARE PHYSICIAN:  Dr. Oliver Barre with [facility].  CHIEF COMPLAINT:  Weakness.  SUBJECTIVE:  This is a 77 year old female recently discharged after an iliacus hematoma and concomitant hip pain, atrial fibrillation, questionable history of myeloma who presents to the ER with a chief complaint of generalized weakness.  The patient states that she sustained a bruise in her right upper extremity a few days ago and came in with right arm pain.  She also has experienced some generalized weakness but denies any fever, chills, or rigors.  The patient was recently hospitalized between March 18, 2011, and March 24, 2011, with multiple medical problems and was discharged to [facility] for rehabilitation.  The patient apparently when she presented to the ED was also in atrial fibrillation with rapid ventricular response.  The atrial fibrillation is a known diagnosis for her.  She denies any chest pain, shortness of breath, syncope, near- syncope, or lightheadedness.  PAST MEDICAL HISTORY: 1. Atrial fibrillation, rate controlled. 2. Diastolic heart failure with EF of 50-60% on a recent echo in April     2012. 3. Hypertension. 4. Diabetes mellitus. 5. Dyslipidemia. 6. Constipation. 7. Acute kidney injury, now resolved. 8. History of anemia. 9. Questionable history of myeloma.  CURRENT MEDICATIONS:  Celexa, Xanax, labetalol, metformin, hydralazine, lovastatin, Lasix, Norco, vitamin D3, Lidoderm, Colace, Senokot, sliding scale insulin, aspirin.  SOCIAL HISTORY:  The patient lives at home with her niece.  FAMILY HISTORY:  Noncontributory.  ALLERGIES:   CLONIDINE.  PHYSICAL EXAMINATION:  VITAL SIGNS:  Blood pressure 150/71, pulse of 120, respirations 22, temperature 98.8. GENERAL:  Comfortable, currently in no acute cardiopulmonary distress. HEENT:  Pupils equal and reactive.  Extraocular movements intact. NECK:  Supple.  No JVD. LUNGS:  Clear to auscultation bilaterally.  No wheezes or crackles or rhonchi. CARDIOVASCULAR:  Irregularly irregular. ABDOMEN:  Soft, nontender, nondistended. EXTREMITIES:  Some bruising and erythema in the proximal aspect of the right forearm. NEUROLOGIC:  Cranial nerves II through XII grossly intact.  LABORATORY DATA:  ProBNP 7619.  CMP:  Sodium 135, potassium 3.5, chloride 101, bicarb 24, glucose 128, BUN 33, creatinine 1.34, total bilirubin 1.1, AST 17, ALT 14,009.9.  Urinalysis shows wbc's too numerous to count and large amount of leukocyte esterase. INR is 1.19.  CBC:  WBC 8.6, hemoglobin 9.2, hematocrit 27.9, and a platelet count of 232. Chest x-ray shows cardiomegaly, no acute findings.  ASSESSMENT: 1. Atrial fibrillation with rapid ventricular response. 2. Weakness due to atrial fibrillation with rapid ventricular     response, secondary to urinary tract infection versus recent     deconditioning from a prolonged hospitalization at end of     September. 3. Hypertension. 4. Diabetes mellitus, which is currently controlled. 5. Stage I-II chronic kidney disease with creatinine at baseline.  PLAN:  The patient will be admitted to telemetry.  We will continue with a Cardizem drip and titrate it for rate control.  We will rule out a pulmonary embolism as the patient was recently hospitalized and discharged off her Coumadin because primarily because of her hematoma. She has a high risk of DVT and PE.  We will also Doppler her right upper extremity to rule out a DVT.  The patient's hemoglobin is at baseline, and she does not require any further intervention for this.  She will be started on  Lovenox for DVT prophylaxis.  A PT, OT consultation will be obtained because of her weakness.  She will be discharged to [facility] after treatment of her urinary tract infection.  Because of her atrial fibrillation with RVR, we will cycle cardiac enzymes to rule out an acute coronary event.  We will also do a stat D-dimer; if this is elevated, we will do a V/Q to rule out a PE.  She is a full code.     Richarda Overlie, MD     NA/MEDQ  D:  04/03/2011  T:  04/03/2011  Job:  [id number]  Electronically Signed by Richarda Overlie MD on 04/28/2011 11:46:04 PM

## 2011-05-11 ENCOUNTER — Ambulatory Visit: Payer: Medicare Other | Admitting: Internal Medicine

## 2011-05-11 DIAGNOSIS — Z0289 Encounter for other administrative examinations: Secondary | ICD-10-CM

## 2011-05-12 ENCOUNTER — Telehealth: Payer: Self-pay

## 2011-05-12 NOTE — Telephone Encounter (Signed)
Patient left message requesting appointment. The patient left only her name and DOB.  I called the number in the chart to schedule for her and no answer.

## 2011-05-13 ENCOUNTER — Telehealth: Payer: Self-pay

## 2011-05-13 NOTE — Telephone Encounter (Signed)
Ok for all 

## 2011-05-13 NOTE — Telephone Encounter (Signed)
Called the patient back and she is going to need to schedule a post hospital OV with Dr. Jonny Ruiz. She is having problems getting around due to her legs being weak and swollen. She agreed to schedule next week and will see the home health nurse today and therapist on Saturday. She wanted her PCP to be informed she is trying to get in for an OV just having a difficult time getting a ride and help to get here.

## 2011-05-13 NOTE — Telephone Encounter (Signed)
Noted, we'll look forward to seeing her when she is able

## 2011-05-13 NOTE — Telephone Encounter (Signed)
Verbal orders needed for home care services, social services PT, home aide, bedside commode and a wheelchair.

## 2011-05-13 NOTE — Telephone Encounter (Signed)
[facility] called requesting a call back regarding this patient

## 2011-05-14 NOTE — Telephone Encounter (Signed)
Called Jacki Cones RN informed ok to all. They need a prescription for the bedside commode Please. She will call with fax number

## 2011-05-14 NOTE — Telephone Encounter (Signed)
Done hardcopy to robin  

## 2011-05-14 NOTE — Telephone Encounter (Signed)
Called laurie to get fax number left message to call back

## 2011-05-17 ENCOUNTER — Telehealth: Payer: Self-pay

## 2011-05-17 NOTE — Telephone Encounter (Signed)
Informed Jacki Cones of MD's instructions.

## 2011-05-17 NOTE — Telephone Encounter (Signed)
Needs OV, any MD, or even UC or ER

## 2011-05-17 NOTE — Telephone Encounter (Signed)
Called Laurie  Left message to leave message with fax number and can send over bedside commode rx.

## 2011-05-17 NOTE — Telephone Encounter (Signed)
[facility] RN Home health called to inform the patient has fever 99.3 axillary and heard some congestion in her lower lobes. Please advise. Call back number is [phone]

## 2011-05-17 NOTE — Telephone Encounter (Signed)
Jacki Cones called back I faxed rx for bedside commode to [phone]

## 2011-05-18 ENCOUNTER — Emergency Department (HOSPITAL_COMMUNITY)
Admission: EM | Admit: 2011-05-18 | Discharge: 2011-05-18 | Disposition: A | Discharge status: Home / Self Care | Payer: Medicare Other | Attending: Emergency Medicine | Admitting: Emergency Medicine

## 2011-05-18 ENCOUNTER — Emergency Department (HOSPITAL_COMMUNITY): Payer: Medicare Other

## 2011-05-18 ENCOUNTER — Other Ambulatory Visit: Payer: Self-pay

## 2011-05-18 DIAGNOSIS — R0989 Other specified symptoms and signs involving the circulatory and respiratory systems: Secondary | ICD-10-CM | POA: Insufficient documentation

## 2011-05-18 DIAGNOSIS — R609 Edema, unspecified: Secondary | ICD-10-CM | POA: Insufficient documentation

## 2011-05-18 DIAGNOSIS — I2789 Other specified pulmonary heart diseases: Secondary | ICD-10-CM | POA: Insufficient documentation

## 2011-05-18 DIAGNOSIS — R05 Cough: Secondary | ICD-10-CM

## 2011-05-18 DIAGNOSIS — E119 Type 2 diabetes mellitus without complications: Secondary | ICD-10-CM | POA: Insufficient documentation

## 2011-05-18 DIAGNOSIS — E039 Hypothyroidism, unspecified: Secondary | ICD-10-CM | POA: Insufficient documentation

## 2011-05-18 DIAGNOSIS — R059 Cough, unspecified: Secondary | ICD-10-CM | POA: Insufficient documentation

## 2011-05-18 DIAGNOSIS — I4891 Unspecified atrial fibrillation: Secondary | ICD-10-CM | POA: Insufficient documentation

## 2011-05-18 DIAGNOSIS — R0609 Other forms of dyspnea: Secondary | ICD-10-CM | POA: Insufficient documentation

## 2011-05-18 DIAGNOSIS — Z7901 Long term (current) use of anticoagulants: Secondary | ICD-10-CM | POA: Insufficient documentation

## 2011-05-18 LAB — DIFFERENTIAL
Basophils Absolute: 0 10*3/uL (ref 0.0–0.1)
Basophils Relative: 1 % (ref 0–1)
Eosinophils Relative: 1 % (ref 0–5)
Monocytes Absolute: 0.5 10*3/uL (ref 0.1–1.0)

## 2011-05-18 LAB — CBC
HCT: 31.7 % — ABNORMAL LOW (ref 36.0–46.0)
MCHC: 32.2 g/dL (ref 30.0–36.0)
MCV: 96.1 fL (ref 78.0–100.0)
RDW: 13.9 % (ref 11.5–15.5)

## 2011-05-18 LAB — POCT I-STAT, CHEM 8
BUN: 18 mg/dL (ref 6–23)
Calcium, Ion: 1.25 mmol/L (ref 1.12–1.32)
Creatinine, Ser: 1.2 mg/dL — ABNORMAL HIGH (ref 0.50–1.10)
TCO2: 28 mmol/L (ref 0–100)

## 2011-05-18 NOTE — ED Provider Notes (Signed)
History     CSN: [id number] Arrival date & time: 05/18/2011 11:58 AM   First MD Initiated Contact with Patient 05/18/11 1231      Chief Complaint  Patient presents with  . Cough    (Consider location/radiation/quality/duration/timing/severity/associated sxs/prior treatment) HPI Place of cough for the past 3 or 4 days no other complaint no shortness of breath no chest pain no fever cough is nonproductive no treatment prior to coming here nothing makes symptoms better or worse. No pain anywhere. Presently feels well Past Medical History  Diagnosis Date  . Obesity   . Anemia, iron deficiency   . Depression   . Diabetes mellitus type II   . Hyperlipidemia   . Hypertension   . Peptic ulcer   . History of uterine fibroid   . Hx of colonic polyps   . Diverticula, colon   . Osteoarthritis   . Low back pain   . Atrial fibrillation     permanent, on coumadin (followed by [facility]in clinic)  . Pulmonary hypertension     echo 4/12:  EF 55-60%, mod LVH, mild BAE, RVE and PASP 51  . Anxiety   . DIABETES MELLITUS, TYPE II 03/05/2007  . HYPERLIPIDEMIA 03/05/2007  . ANEMIA-IRON DEFICIENCY 03/05/2007  . HYPERTENSION 03/05/2007  . DEPRESSION 03/05/2007  . LOW BACK PAIN 03/05/2007  . PERIPHERAL EDEMA 04/29/2008  . Chronic pain syndrome 06/23/2010  . FIBROIDS, UTERUS 03/05/2007  . PEPTIC ULCER DISEASE 03/05/2007  . DIVERTICULOSIS, COLON 03/05/2007  . OSTEOARTHRITIS 03/05/2007  . COLONIC POLYPS, HX OF 03/05/2007  . Hypothyroidism 10/15/2010  . Renal insufficiency 10/15/2010    Past Surgical History  Procedure Date  . Laparoscopic hysterectomy     Family History  Problem Relation Age of Onset  . Hypertension Sister     History  Substance Use Topics  . Smoking status: Former Games developer  . Smokeless tobacco: Not on file  . Alcohol Use: No    OB History    Grav Para Term Preterm Abortions TAB SAB Ect Mult Living                  Review of Systems  Constitutional: Negative.   HENT: Negative.     Respiratory: Positive for cough.   Cardiovascular:       Peripheral edema, chronic  Gastrointestinal: Negative.   Musculoskeletal: Negative.   Skin: Negative.   Neurological: Negative.   Hematological: Negative.   Psychiatric/Behavioral: Negative.     Allergies  Review of patient's allergies indicates no known allergies.  Home Medications   Current Outpatient Rx  Name Route Sig Dispense Refill  . ALPRAZOLAM 0.25 MG PO TABS Oral Take 0.25 mg by mouth 2 (two) times daily as needed.      Marland Kitchen VITAMIN D 1000 UNITS PO CAPS Oral Take 1,000 Units by mouth daily.      Marland Kitchen CITALOPRAM HYDROBROMIDE 20 MG PO TABS  TAKE 1 TABLET BY MOUTH EVERY DAY 90 tablet 3  . DILTIAZEM HCL COATED BEADS 120 MG PO CP24 Oral Take 1 capsule (120 mg total) by mouth daily. 30 capsule 11  . FUROSEMIDE 40 MG PO TABS Oral Take 1.5 tablets (60 mg total) by mouth daily. 30 tablet 11  . HYDRALAZINE HCL 50 MG PO TABS Oral Take 50 mg by mouth 3 (three) times daily.      Marland Kitchen HYDROCODONE-ACETAMINOPHEN 5-325 MG PO TABS Oral Take 1 tablet by mouth every 8 (eight) hours as needed. 90 tablet 5  . LABETALOL HCL  300 MG PO TABS  Take one tablet two times daily 60 tablet 8  . LEVOTHYROXINE SODIUM 25 MCG PO TABS Oral Take 25 mcg by mouth daily.      Marland Kitchen LOVASTATIN 40 MG PO TABS  TAKE 2 TABLETS BY MOUTH EVERY DAY 180 tablet 3  . METFORMIN HCL ER 500 MG PO TB24 Oral Take 1 tablet (500 mg total) by mouth daily with breakfast. 30 tablet 10  . WARFARIN SODIUM 5 MG PO TABS  USE AS DIRECTED 50 tablet 5    BP 124/67  Pulse 77  Temp(Src) 97.9 F (36.6 C) (Oral)  Resp 18  Ht 5\' 6"  (1.676 m)  Wt 177 lb (80.287 kg)  BMI 28.57 kg/m2  SpO2 99%  Physical Exam  Constitutional: She appears well-developed and well-nourished.  HENT:  Head: Normocephalic and atraumatic.  Eyes: Conjunctivae are normal. Pupils are equal, round, and reactive to light.  Neck: Neck supple. No tracheal deviation present. No thyromegaly present.       JVD present   Cardiovascular: Normal rate.   No murmur heard.      Irregularly irregular  Pulmonary/Chest: Effort normal and breath sounds normal.  Abdominal: Soft. Bowel sounds are normal. She exhibits no distension. There is no tenderness.  Musculoskeletal: She exhibits no edema and no tenderness.       2+ pitting pretibial edema bilaterally  Neurological: She is alert. Coordination normal.  Skin: Skin is warm and dry. No rash noted.  Psychiatric: She has a normal mood and affect.    ED Course  Procedures (including critical care time)  Labs Reviewed - No data to display No results found.   No diagnosis found.   Date: 05/18/2011  Rate: 70  Rhythm: atrial fibrillation  QRS Axis: normal  Intervals: normal  ST/T Wave abnormalities: nonspecific ST/T changes  Conduction Disutrbances:right bundle branch block  Narrative Interpretation:   Old EKG Reviewed: changes noted  Rate controlled as compared to prior tracing from 04/03/2011, otherwise no significant change MDM  Patient's BNP significantly less then 03/18/2011 (prior value was 5941.) Clinically CHF is unlikely. Patient lying flat with out increased dyspnea, unchanged cardiomegaly on chest x-ray clear lung exam normal pulse oximetry normal respiratory rate. Plan home observation no further treatment needed. Followup Dr. Jonny Ruiz if not better one week I also discussed INR with  cardmasters Trish, and will arrange for Coumadin clinic to call patient tomorrow to adjust dosage Diagnosis cough   Doug Sou, MD 05/18/11 1652

## 2011-05-18 NOTE — ED Notes (Signed)
ZOX:[id number]<AV> Expected date:05/18/11<BR> Expected time:11:48 AM<BR> Means of arrival:Ambulance<BR> Comments:<BR> EMS 32 Ptar - possible pneumonia

## 2011-05-18 NOTE — ED Notes (Signed)
Patient was to have CXR yesterday, but the family caregiver did not know about it until today, so [facility] was called to bring her to the ER for CXR that PCP wanted her to have yesterday.

## 2011-05-18 NOTE — ED Notes (Signed)
Patient denies pain and is resting comfortably.  

## 2011-05-18 NOTE — ED Notes (Signed)
Vital signs stable. 

## 2011-05-18 NOTE — ED Notes (Signed)
Patient is resting comfortably. 

## 2011-05-18 NOTE — ED Notes (Signed)
Patient to be discharged home.   Niece has been called to make sure that someone will be at the residence when patient arrives via [place]

## 2011-05-21 ENCOUNTER — Ambulatory Visit (INDEPENDENT_AMBULATORY_CARE_PROVIDER_SITE_OTHER): Payer: Self-pay | Admitting: Cardiovascular Disease

## 2011-05-21 DIAGNOSIS — I2789 Other specified pulmonary heart diseases: Secondary | ICD-10-CM

## 2011-05-21 DIAGNOSIS — R0989 Other specified symptoms and signs involving the circulatory and respiratory systems: Secondary | ICD-10-CM

## 2011-05-21 DIAGNOSIS — I4891 Unspecified atrial fibrillation: Secondary | ICD-10-CM

## 2011-05-25 ENCOUNTER — Ambulatory Visit (INDEPENDENT_AMBULATORY_CARE_PROVIDER_SITE_OTHER): Payer: Self-pay | Admitting: Cardiology

## 2011-05-25 DIAGNOSIS — R269 Unspecified abnormalities of gait and mobility: Secondary | ICD-10-CM

## 2011-05-25 DIAGNOSIS — I2789 Other specified pulmonary heart diseases: Secondary | ICD-10-CM

## 2011-05-25 DIAGNOSIS — I509 Heart failure, unspecified: Secondary | ICD-10-CM

## 2011-05-25 DIAGNOSIS — I10 Essential (primary) hypertension: Secondary | ICD-10-CM

## 2011-05-25 DIAGNOSIS — R0989 Other specified symptoms and signs involving the circulatory and respiratory systems: Secondary | ICD-10-CM

## 2011-05-25 DIAGNOSIS — E119 Type 2 diabetes mellitus without complications: Secondary | ICD-10-CM

## 2011-05-25 DIAGNOSIS — I4891 Unspecified atrial fibrillation: Secondary | ICD-10-CM

## 2011-05-31 ENCOUNTER — Ambulatory Visit (INDEPENDENT_AMBULATORY_CARE_PROVIDER_SITE_OTHER): Payer: Self-pay | Admitting: Internal Medicine

## 2011-05-31 DIAGNOSIS — I4891 Unspecified atrial fibrillation: Secondary | ICD-10-CM

## 2011-05-31 DIAGNOSIS — R0989 Other specified symptoms and signs involving the circulatory and respiratory systems: Secondary | ICD-10-CM

## 2011-05-31 DIAGNOSIS — I2789 Other specified pulmonary heart diseases: Secondary | ICD-10-CM

## 2011-05-31 LAB — POCT INR: INR: 2.4

## 2011-06-09 ENCOUNTER — Other Ambulatory Visit: Payer: Self-pay | Admitting: Internal Medicine

## 2011-06-11 ENCOUNTER — Ambulatory Visit (INDEPENDENT_AMBULATORY_CARE_PROVIDER_SITE_OTHER): Payer: Self-pay | Admitting: Cardiology

## 2011-06-11 DIAGNOSIS — R0989 Other specified symptoms and signs involving the circulatory and respiratory systems: Secondary | ICD-10-CM

## 2011-06-11 DIAGNOSIS — I2789 Other specified pulmonary heart diseases: Secondary | ICD-10-CM

## 2011-06-11 DIAGNOSIS — I4891 Unspecified atrial fibrillation: Secondary | ICD-10-CM

## 2011-06-14 ENCOUNTER — Other Ambulatory Visit: Payer: Self-pay

## 2011-06-14 MED ORDER — HYDROCODONE-ACETAMINOPHEN 5-325 MG PO TABS: 1.0000 | ORAL_TABLET | Freq: Three times a day (TID) | ORAL | Status: DC | PRN

## 2011-06-14 NOTE — Telephone Encounter (Signed)
Faxed hardcopy to [facility] [address] 

## 2011-06-14 NOTE — Telephone Encounter (Signed)
Done hardcopy to robin  

## 2011-06-28 ENCOUNTER — Ambulatory Visit (INDEPENDENT_AMBULATORY_CARE_PROVIDER_SITE_OTHER): Payer: Self-pay | Admitting: Internal Medicine

## 2011-06-28 DIAGNOSIS — R0989 Other specified symptoms and signs involving the circulatory and respiratory systems: Secondary | ICD-10-CM

## 2011-06-28 DIAGNOSIS — I4891 Unspecified atrial fibrillation: Secondary | ICD-10-CM

## 2011-06-28 DIAGNOSIS — I2789 Other specified pulmonary heart diseases: Secondary | ICD-10-CM

## 2011-07-13 ENCOUNTER — Ambulatory Visit (INDEPENDENT_AMBULATORY_CARE_PROVIDER_SITE_OTHER): Payer: Medicare Other | Admitting: Internal Medicine

## 2011-07-13 ENCOUNTER — Encounter: Payer: Self-pay | Admitting: Internal Medicine

## 2011-07-13 ENCOUNTER — Ambulatory Visit (INDEPENDENT_AMBULATORY_CARE_PROVIDER_SITE_OTHER): Payer: Self-pay | Admitting: Cardiology

## 2011-07-13 ENCOUNTER — Other Ambulatory Visit (INDEPENDENT_AMBULATORY_CARE_PROVIDER_SITE_OTHER): Payer: Medicare Other

## 2011-07-13 VITALS — BP 130/88 | HR 102 | Temp 97.5°F | Wt 188.0 lb

## 2011-07-13 DIAGNOSIS — R0989 Other specified symptoms and signs involving the circulatory and respiratory systems: Secondary | ICD-10-CM

## 2011-07-13 DIAGNOSIS — D509 Iron deficiency anemia, unspecified: Secondary | ICD-10-CM

## 2011-07-13 DIAGNOSIS — I2789 Other specified pulmonary heart diseases: Secondary | ICD-10-CM

## 2011-07-13 DIAGNOSIS — I50810 Right heart failure, unspecified: Secondary | ICD-10-CM

## 2011-07-13 DIAGNOSIS — E039 Hypothyroidism, unspecified: Secondary | ICD-10-CM

## 2011-07-13 DIAGNOSIS — I4891 Unspecified atrial fibrillation: Secondary | ICD-10-CM

## 2011-07-13 DIAGNOSIS — I509 Heart failure, unspecified: Secondary | ICD-10-CM

## 2011-07-13 DIAGNOSIS — E119 Type 2 diabetes mellitus without complications: Secondary | ICD-10-CM

## 2011-07-13 DIAGNOSIS — Z0000 Encounter for general adult medical examination without abnormal findings: Secondary | ICD-10-CM

## 2011-07-13 LAB — LIPID PANEL
Cholesterol: 121 mg/dL (ref 0–200)
LDL Cholesterol: 52 mg/dL (ref 0–99)
Total CHOL/HDL Ratio: 2
VLDL: 8.2 mg/dL (ref 0.0–40.0)

## 2011-07-13 LAB — CBC WITH DIFFERENTIAL/PLATELET
Basophils Absolute: 0 10*3/uL (ref 0.0–0.1)
Eosinophils Absolute: 0.1 10*3/uL (ref 0.0–0.7)
Lymphocytes Relative: 32.6 % (ref 12.0–46.0)
MCHC: 34 g/dL (ref 30.0–36.0)
Monocytes Relative: 6.9 % (ref 3.0–12.0)
Neutrophils Relative %: 57.3 % (ref 43.0–77.0)
RBC: 3.29 Mil/uL — ABNORMAL LOW (ref 3.87–5.11)
RDW: 13.9 % (ref 11.5–14.6)

## 2011-07-13 LAB — BASIC METABOLIC PANEL
BUN: 18 mg/dL (ref 6–23)
Creatinine, Ser: 1 mg/dL (ref 0.4–1.2)
GFR: 66.71 mL/min (ref >60.00)
Potassium: 4.3 mEq/L (ref 3.5–5.1)

## 2011-07-13 LAB — IBC PANEL
Iron: 52 ug/dL (ref 42–145)
Saturation Ratios: 18.1 % — ABNORMAL LOW (ref 20.0–50.0)
Transferrin: 205.6 mg/dL — ABNORMAL LOW (ref 212.0–360.0)

## 2011-07-13 LAB — POCT INR: INR: 1.9

## 2011-07-13 LAB — TSH: TSH: 1.77 u[IU]/mL (ref 0.35–5.50)

## 2011-07-13 LAB — BRAIN NATRIURETIC PEPTIDE: Pro B Natriuretic peptide (BNP): 528 pg/mL — ABNORMAL HIGH (ref 0.0–100.0)

## 2011-07-13 MED ORDER — LOVASTATIN 40 MG PO TABS: 80.0000 mg | ORAL_TABLET | Freq: Every day | ORAL | Status: DC

## 2011-07-13 MED ORDER — METFORMIN HCL 500 MG PO TABS: 500.0000 mg | ORAL_TABLET | Freq: Every day | ORAL | Status: DC

## 2011-07-13 NOTE — Assessment & Plan Note (Addendum)
Ok rate, on coumadin,  to f/u any worsening symptoms or concerns, Continue all other medications as before

## 2011-07-13 NOTE — Patient Instructions (Signed)
Please take the lasix (one and 1/2 pills) twice per day for 3 days, then go back to the once in the AM Please dont drink "extra" fluids as this will make the swelling in the legs worse Continue all other medications as before Please go to LAB in the Basement for the blood and/or urine tests to be done today Please call the phone number [phone] (the PhoneTree System) for results of testing in 2-3 days;  When calling, simply dial the number, and when prompted enter the MRN number above (the Medical Record Number) and the # key, then the message should start. Please return in 6 mo with Lab testing done 3-5 days before

## 2011-07-13 NOTE — Assessment & Plan Note (Signed)
stable overall by hx and exam, most recent data reviewed with pt, and pt to continue medical treatment as before  Lab Results  Component Value Date   HGB 10.4* 07/13/2011

## 2011-07-13 NOTE — Assessment & Plan Note (Signed)
Mild LE edema, for lasix bid for 3 days then resume qd, check bnp

## 2011-07-13 NOTE — Assessment & Plan Note (Signed)
stable overall by hx and exam, most recent data reviewed with pt, and pt to continue medical treatment as before  Lab Results  Component Value Date   TSH 1.77 07/13/2011

## 2011-07-13 NOTE — Assessment & Plan Note (Signed)
stable overall by hx and exam, most recent data reviewed with pt, and pt to continue medical treatment as before Lab Results  Component Value Date   HGBA1C 6.5 05/02/2012    

## 2011-07-13 NOTE — Progress Notes (Signed)
Subjective:    Patient ID: [name], female    DOB: Nov 17, 1931, 78 y.o.   MRN: [id number]  HPI  Here to f/u;  Last hospn oct 6-9 with afib wirh RVR and diast CHF exac, then in extended rehab at [facility] for approx 2 months due to weakness, diffictuly with ambulation, now walking with walker room to room at home;  Overall good compliance with treatment, and good medicine tolerability, including the coumadin with last INR adequate, no overt bleeding or bruising.  No further PT needed.  Lives alone, but niece there 2-3 times per day/every day to monitor and cook for her, shopping.and bills;  Pt is able to dress, feed herself, toilet herself with BSC, and doing spongebaths on her own.  No recent falls, or confusion.   Pt denies fever, wt loss, night sweats, loss of appetite, or other constitutional symptoms.    Pt denies chest pain, increased sob or doe, wheezing, orthopnea, PND, increased LE swelling, palpitations, dizziness or syncope.Pt denies new neurological symptoms such as new headache, or facial or extremity weakness or numbness   Pt denies polydipsia, polyuria, or low sugar symptoms such as weakness or confusion improved with po intake.  Pt states overall good compliance with meds, trying to follow lower cholesterol, diabetic diet, wt overall stable but little exercise however.   CBG yesterday in the am - 117, now off the glipizide, only on metformin, and not using the novolog SSI recently as her sugars not elevated.  Does have incr LE edema in the past few wks, better with elevation, but also under the impression she needs to drink 8 glasses water per day. Past Medical History  Diagnosis Date  . Obesity   . Anemia, iron deficiency   . Depression   . Diabetes mellitus type II   . Hyperlipidemia   . Hypertension   . Peptic ulcer   . History of uterine fibroid   . Hx of colonic polyps   . Diverticula, colon   . Osteoarthritis   . Low back pain   . Atrial fibrillation     permanent, on  coumadin (followed by [facility] coumadin clinic)  . Pulmonary hypertension     echo 4/12:  EF 55-60%, mod LVH, mild BAE, RVE and PASP 51  . Anxiety   . DIABETES MELLITUS, TYPE II 03/05/2007  . HYPERLIPIDEMIA 03/05/2007  . ANEMIA-IRON DEFICIENCY 03/05/2007  . HYPERTENSION 03/05/2007  . DEPRESSION 03/05/2007  . LOW BACK PAIN 03/05/2007  . PERIPHERAL EDEMA 04/29/2008  . Chronic pain syndrome 06/23/2010  . FIBROIDS, UTERUS 03/05/2007  . PEPTIC ULCER DISEASE 03/05/2007  . DIVERTICULOSIS, COLON 03/05/2007  . OSTEOARTHRITIS 03/05/2007  . COLONIC POLYPS, HX OF 03/05/2007  . Hypothyroidism 10/15/2010  . Renal insufficiency 10/15/2010   Past Surgical History  Procedure Date  . Laparoscopic hysterectomy     reports that she has quit smoking. She does not have any smokeless tobacco history on file. She reports that she does not drink alcohol or use illicit drugs. family history includes Hypertension in her sister. Allergies  Allergen Reactions  . Clonidine Derivatives Rash    Skin breaks out   Current Outpatient Prescriptions on File Prior to Visit  Medication Sig Dispense Refill  . ALPRAZolam (XANAX) 0.25 MG tablet Take 0.25 mg by mouth 2 (two) times daily as needed. For anxiety      . Cholecalciferol (VITAMIN D) 1000 UNITS capsule Take 1,000 Units by mouth daily.       . citalopram (CELEXA)  20 MG tablet TAKE 1 TABLET EVERY DAY  30 tablet  6  . diltiazem (CARDIZEM CD) 240 MG 24 hr capsule Take 240 mg by mouth daily.        Marland Kitchen docusate sodium (COLACE) 100 MG capsule Take 100 mg by mouth 2 (two) times daily as needed. For constipation       . furosemide (LASIX) 40 MG tablet Take 60 mg by mouth daily.       . hydrALAZINE (APRESOLINE) 50 MG tablet Take 50 mg by mouth 3 (three) times daily.       Marland Kitchen HYDROcodone-acetaminophen (NORCO) 5-325 MG per tablet Take 1 tablet by mouth every 8 (eight) hours as needed. For pain  90 tablet  5  . insulin aspart (NOVOLOG) 100 UNIT/ML injection Inject 5 Units into the skin 3 (three) times  daily before meals. If CBG is greater than or equal to 150, give 5 units       . labetalol (NORMODYNE) 300 MG tablet Take 300 mg by mouth 2 (two) times daily.        Marland Kitchen levothyroxine (SYNTHROID, LEVOTHROID) 25 MCG tablet Take 25 mcg by mouth daily.       Marland Kitchen lidocaine (LIDODERM) 5 % Place 1 patch onto the skin daily. Remove & Discard patch within 12 hours or as directed by MD       . lovastatin (MEVACOR) 40 MG tablet Take 80 mg by mouth daily.        . metFORMIN (GLUCOPHAGE) 500 MG tablet Take 500 mg by mouth daily.        Marland Kitchen senna (SENOKOT) 8.6 MG TABS Take 2 tablets by mouth daily as needed. For constipation       . sorbitol 70 % solution Take 25 mLs by mouth daily as needed. For constipation       . warfarin (COUMADIN) 5 MG tablet Take 5 mg by mouth daily.         Review of Systems Review of Systems  Constitutional: Negative for diaphoresis and unexpected weight change.  HENT: Negative for drooling and tinnitus.   Eyes: Negative for photophobia and visual disturbance.  Respiratory: Negative for choking and stridor.   Gastrointestinal: Negative for vomiting and blood in stool.  Genitourinary: Negative for hematuria and decreased urine volume.    Objective:   Physical Exam BP 130/88  Pulse 102  Temp(Src) 97.5 F (36.4 C) (Oral)  Wt 188 lb (85.276 kg)  SpO2 96% Physical Exam  VS noted Constitutional: Pt appears well-developed and well-nourished.  HENT: Head: Normocephalic.  Right Ear: External ear normal.  Left Ear: External ear normal.  Eyes: Conjunctivae and EOM are normal. Pupils are equal, round, and reactive to light.  Neck: Normal range of motion. Neck supple.  Cardiovascular: Normal rate and irregular rhythm.   Pulmonary/Chest: Effort normal and breath sounds normal.  Abd:  Soft, NT, non-distended, + BS Neurological: Pt is alert. No cranial nerve deficit.  Skin: Skin is warm. No erythema. 2+LE edema Psychiatric: Pt behavior is normal. Thought content normal.     Assessment  & Plan:

## 2011-07-20 ENCOUNTER — Ambulatory Visit (INDEPENDENT_AMBULATORY_CARE_PROVIDER_SITE_OTHER): Payer: Self-pay | Admitting: Cardiology

## 2011-07-20 DIAGNOSIS — I2789 Other specified pulmonary heart diseases: Secondary | ICD-10-CM

## 2011-07-20 DIAGNOSIS — R0989 Other specified symptoms and signs involving the circulatory and respiratory systems: Secondary | ICD-10-CM

## 2011-07-20 DIAGNOSIS — I4891 Unspecified atrial fibrillation: Secondary | ICD-10-CM

## 2011-07-20 LAB — POCT INR: INR: 1.7

## 2011-07-26 DIAGNOSIS — I509 Heart failure, unspecified: Secondary | ICD-10-CM

## 2011-07-26 DIAGNOSIS — E119 Type 2 diabetes mellitus without complications: Secondary | ICD-10-CM

## 2011-07-26 DIAGNOSIS — R269 Unspecified abnormalities of gait and mobility: Secondary | ICD-10-CM

## 2011-07-26 DIAGNOSIS — I10 Essential (primary) hypertension: Secondary | ICD-10-CM

## 2011-08-03 ENCOUNTER — Ambulatory Visit (INDEPENDENT_AMBULATORY_CARE_PROVIDER_SITE_OTHER): Payer: Self-pay | Admitting: Cardiology

## 2011-08-03 DIAGNOSIS — R0989 Other specified symptoms and signs involving the circulatory and respiratory systems: Secondary | ICD-10-CM

## 2011-08-03 DIAGNOSIS — I4891 Unspecified atrial fibrillation: Secondary | ICD-10-CM

## 2011-08-03 DIAGNOSIS — I2789 Other specified pulmonary heart diseases: Secondary | ICD-10-CM

## 2011-08-10 ENCOUNTER — Ambulatory Visit (INDEPENDENT_AMBULATORY_CARE_PROVIDER_SITE_OTHER): Payer: Self-pay | Admitting: Cardiology

## 2011-08-10 DIAGNOSIS — R0989 Other specified symptoms and signs involving the circulatory and respiratory systems: Secondary | ICD-10-CM

## 2011-08-10 DIAGNOSIS — I4891 Unspecified atrial fibrillation: Secondary | ICD-10-CM

## 2011-08-10 DIAGNOSIS — I2789 Other specified pulmonary heart diseases: Secondary | ICD-10-CM

## 2011-08-26 ENCOUNTER — Other Ambulatory Visit: Payer: Self-pay

## 2011-08-26 MED ORDER — HYDRALAZINE HCL 50 MG PO TABS: 50.0000 mg | ORAL_TABLET | Freq: Three times a day (TID) | ORAL | Status: DC

## 2011-09-20 ENCOUNTER — Telehealth: Payer: Self-pay | Admitting: *Deleted

## 2011-09-20 DIAGNOSIS — I4891 Unspecified atrial fibrillation: Secondary | ICD-10-CM

## 2011-09-20 NOTE — Telephone Encounter (Signed)
Talked with pt and rescheduled visit with coumadin clinic

## 2011-09-21 ENCOUNTER — Ambulatory Visit (INDEPENDENT_AMBULATORY_CARE_PROVIDER_SITE_OTHER): Payer: Medicare Other | Admitting: *Deleted

## 2011-09-21 DIAGNOSIS — I4891 Unspecified atrial fibrillation: Secondary | ICD-10-CM

## 2011-09-21 DIAGNOSIS — I2789 Other specified pulmonary heart diseases: Secondary | ICD-10-CM

## 2011-09-29 ENCOUNTER — Other Ambulatory Visit: Payer: Self-pay

## 2011-09-29 MED ORDER — LEVOTHYROXINE SODIUM 25 MCG PO TABS: 25.0000 ug | ORAL_TABLET | Freq: Every day | ORAL | Status: DC

## 2011-10-07 ENCOUNTER — Other Ambulatory Visit: Payer: Self-pay | Admitting: Internal Medicine

## 2011-10-12 ENCOUNTER — Ambulatory Visit (INDEPENDENT_AMBULATORY_CARE_PROVIDER_SITE_OTHER): Payer: Medicare Other | Admitting: Pharmacist

## 2011-10-12 DIAGNOSIS — I2789 Other specified pulmonary heart diseases: Secondary | ICD-10-CM

## 2011-10-12 DIAGNOSIS — I4891 Unspecified atrial fibrillation: Secondary | ICD-10-CM

## 2011-11-02 ENCOUNTER — Ambulatory Visit (INDEPENDENT_AMBULATORY_CARE_PROVIDER_SITE_OTHER): Payer: Medicare Other | Admitting: *Deleted

## 2011-11-02 DIAGNOSIS — I4891 Unspecified atrial fibrillation: Secondary | ICD-10-CM

## 2011-11-02 DIAGNOSIS — I2789 Other specified pulmonary heart diseases: Secondary | ICD-10-CM

## 2011-11-23 ENCOUNTER — Ambulatory Visit (INDEPENDENT_AMBULATORY_CARE_PROVIDER_SITE_OTHER): Payer: Medicare Other | Admitting: *Deleted

## 2011-11-23 DIAGNOSIS — I2789 Other specified pulmonary heart diseases: Secondary | ICD-10-CM

## 2011-11-23 DIAGNOSIS — I4891 Unspecified atrial fibrillation: Secondary | ICD-10-CM

## 2011-11-29 ENCOUNTER — Other Ambulatory Visit: Payer: Self-pay | Admitting: Internal Medicine

## 2011-12-14 ENCOUNTER — Ambulatory Visit (INDEPENDENT_AMBULATORY_CARE_PROVIDER_SITE_OTHER): Payer: Medicare Other

## 2011-12-14 DIAGNOSIS — I2789 Other specified pulmonary heart diseases: Secondary | ICD-10-CM

## 2011-12-14 DIAGNOSIS — I4891 Unspecified atrial fibrillation: Secondary | ICD-10-CM

## 2012-01-09 ENCOUNTER — Other Ambulatory Visit: Payer: Self-pay | Admitting: Internal Medicine

## 2012-01-11 ENCOUNTER — Ambulatory Visit (INDEPENDENT_AMBULATORY_CARE_PROVIDER_SITE_OTHER): Payer: Medicare Other | Admitting: Internal Medicine

## 2012-01-11 ENCOUNTER — Other Ambulatory Visit (INDEPENDENT_AMBULATORY_CARE_PROVIDER_SITE_OTHER): Payer: Medicare Other

## 2012-01-11 ENCOUNTER — Encounter: Payer: Self-pay | Admitting: Internal Medicine

## 2012-01-11 VITALS — BP 142/78 | HR 62 | Temp 97.1°F | Wt 153.4 lb

## 2012-01-11 DIAGNOSIS — E119 Type 2 diabetes mellitus without complications: Secondary | ICD-10-CM

## 2012-01-11 DIAGNOSIS — Z0000 Encounter for general adult medical examination without abnormal findings: Secondary | ICD-10-CM

## 2012-01-11 LAB — CBC WITH DIFFERENTIAL/PLATELET
Basophils Absolute: 0 10*3/uL (ref 0.0–0.1)
Eosinophils Absolute: 0.1 10*3/uL (ref 0.0–0.7)
Lymphocytes Relative: 29.2 % (ref 12.0–46.0)
MCHC: 33.3 g/dL (ref 30.0–36.0)
MCV: 94.5 fl (ref 78.0–100.0)
Monocytes Absolute: 0.2 10*3/uL (ref 0.1–1.0)
Neutrophils Relative %: 62.2 % (ref 43.0–77.0)
Platelets: 132 10*3/uL — ABNORMAL LOW (ref 150.0–400.0)
RDW: 14.2 % (ref 11.5–14.6)

## 2012-01-11 LAB — HEMOGLOBIN A1C: Hgb A1c MFr Bld: 6.1 % (ref 4.6–6.5)

## 2012-01-11 NOTE — Patient Instructions (Addendum)
Continue all other medications as before Please go to LAB in the Basement for the blood and/or urine tests to be done today You will be contacted by phone if any changes need to be made immediately.  Otherwise, you will receive a letter about your results with an explanation. Please make your appt with Dr Antoine Poche as you have planned Please return in 6 mo with Lab testing done 3-5 days before

## 2012-01-11 NOTE — Progress Notes (Signed)
Subjective:    Patient ID: [name], female    DOB: 08/04/1931, 78 y.o.   MRN: [id number]  HPI  Here for wellness and f/u;  Overall doing ok;  Pt denies CP, worsening SOB, DOE, wheezing, orthopnea, PND, worsening LE edema, palpitations, dizziness or syncope.  Pt denies neurological change such as new Headache, facial or extremity weakness.  Pt denies polydipsia, polyuria, or low sugar symptoms. Pt states overall good compliance with treatment and medications, good tolerability, and trying to follow lower cholesterol diet.  Pt denies worsening depressive symptoms, suicidal ideation or panic. No fever, wt loss, night sweats, loss of appetite, or other constitutional symptoms.  Pt states good ability with ADL's, low fall risk, home safety reviewed and adequate, no significant changes in hearing or vision, and occasionally active with exercise.  Just had funeral yesterday for sister died of breast cancer.  Feels sad, mild depressive symtpoms but no suicidal ideation, delicnes need for furhter tx or counseling at this time.   Here with niece today.  Missed f/u with cardiology somehow/dr hochrein ,was due about jan 2013 Past Medical History  Diagnosis Date  . Obesity   . Anemia, iron deficiency   . Depression   . Diabetes mellitus type II   . Hyperlipidemia   . Hypertension   . Peptic ulcer   . History of uterine fibroid   . Hx of colonic polyps   . Diverticula, colon   . Osteoarthritis   . Low back pain   . Atrial fibrillation     permanent, on coumadin (followed by [facility]in clinic)  . Pulmonary hypertension     echo 4/12:  EF 55-60%, mod LVH, mild BAE, RVE and PASP 51  . Anxiety   . DIABETES MELLITUS, TYPE II 03/05/2007  . HYPERLIPIDEMIA 03/05/2007  . ANEMIA-IRON DEFICIENCY 03/05/2007  . HYPERTENSION 03/05/2007  . DEPRESSION 03/05/2007  . LOW BACK PAIN 03/05/2007  . PERIPHERAL EDEMA 04/29/2008  . Chronic pain syndrome 06/23/2010  . FIBROIDS, UTERUS 03/05/2007  . PEPTIC ULCER DISEASE 03/05/2007  .  DIVERTICULOSIS, COLON 03/05/2007  . OSTEOARTHRITIS 03/05/2007  . COLONIC POLYPS, HX OF 03/05/2007  . Hypothyroidism 10/15/2010  . Renal insufficiency 10/15/2010   Past Surgical History  Procedure Date  . Laparoscopic hysterectomy     reports that she has quit smoking. She does not have any smokeless tobacco history on file. She reports that she does not drink alcohol or use illicit drugs. family history includes Hypertension in her sister. Allergies  Allergen Reactions  . Clonidine Derivatives Rash    Skin breaks out   Current Outpatient Prescriptions on File Prior to Visit  Medication Sig Dispense Refill  . ALPRAZolam (XANAX) 0.25 MG tablet Take 0.25 mg by mouth 2 (two) times daily as needed. For anxiety      . Cholecalciferol (VITAMIN D) 1000 UNITS capsule Take 1,000 Units by mouth daily.       . citalopram (CELEXA) 20 MG tablet TAKE 1 TABLET EVERY DAY  30 tablet  6  . diltiazem (CARDIZEM CD) 120 MG 24 hr capsule TAKE ONE CAPSULE BY MOUTH EVERY DAY  30 capsule  5  . diltiazem (CARDIZEM CD) 240 MG 24 hr capsule Take 240 mg by mouth daily.        Marland Kitchen docusate sodium (COLACE) 100 MG capsule Take 100 mg by mouth 2 (two) times daily as needed. For constipation       . furosemide (LASIX) 40 MG tablet Take 60 mg by mouth  daily.       . hydrALAZINE (APRESOLINE) 50 MG tablet Take 1 tablet (50 mg total) by mouth 3 (three) times daily.  270 tablet  3  . HYDROcodone-acetaminophen (NORCO) 5-325 MG per tablet Take 1 tablet by mouth every 8 (eight) hours as needed. For pain  90 tablet  5  . insulin aspart (NOVOLOG) 100 UNIT/ML injection Inject 5 Units into the skin 3 (three) times daily before meals. If CBG is greater than or equal to 150, give 5 units       . labetalol (NORMODYNE) 300 MG tablet Take 300 mg by mouth 2 (two) times daily.        Marland Kitchen levothyroxine (SYNTHROID, LEVOTHROID) 25 MCG tablet Take 1 tablet (25 mcg total) by mouth daily.  30 tablet  10  . lidocaine (LIDODERM) 5 % Place 1 patch onto the  skin daily. Remove & Discard patch within 12 hours or as directed by MD       . lovastatin (MEVACOR) 40 MG tablet TAKE 2 TABLETS BY MOUTH EVERY DAY  60 tablet  0  . metFORMIN (GLUCOPHAGE) 500 MG tablet Take 1 tablet (500 mg total) by mouth daily.  90 tablet  3  . senna (SENOKOT) 8.6 MG TABS Take 2 tablets by mouth daily as needed. For constipation       . sorbitol 70 % solution Take 25 mLs by mouth daily as needed. For constipation       . warfarin (COUMADIN) 5 MG tablet Take 5 mg by mouth daily.        Marland Kitchen warfarin (COUMADIN) 5 MG tablet USE AS DIRECTED  50 tablet  5  . diltiazem (CARDIZEM CD) 120 MG 24 hr capsule Take 120 mg by mouth daily.       Marland Kitchen lovastatin (MEVACOR) 40 MG tablet         Review of Systems Review of Systems  Constitutional: Negative for diaphoresis, activity change, appetite change and unexpected weight change.  HENT: Negative for hearing loss, ear pain, facial swelling, mouth sores and neck stiffness.   Eyes: Negative for pain, redness and visual disturbance.  Respiratory: Negative for shortness of breath and wheezing.   Cardiovascular: Negative for chest pain and palpitations.  Gastrointestinal: Negative for diarrhea, blood in stool, abdominal distention and rectal pain.  Genitourinary: Negative for hematuria, flank pain and decreased urine volume.  Musculoskeletal: Negative for myalgias and joint swelling.  Skin: Negative for color change and wound.  Neurological: Negative for syncope and numbness.  Hematological: Negative for adenopathy.  Psychiatric/Behavioral: Negative for hallucinations, self-injury, decreased concentration and agitation.      Objective:   Physical Exam BP 142/78  Pulse 62  Temp 97.1 F (36.2 C) (Oral)  Wt 153 lb 6 oz (69.57 kg)  SpO2 97% Physical Exam  VS noted Constitutional: Pt is oriented to person, place, and time. Appears thin, frail Head: Normocephalic and atraumatic.  Right Ear: External ear normal.  Left Ear: External ear  normal.  Nose: Nose normal.  Mouth/Throat: Oropharynx is clear and moist.  Eyes: Conjunctivae and EOM are normal. Pupils are equal, round, and reactive to light.  Neck: Normal range of motion. Neck supple. No JVD present. No tracheal deviation present.  Cardiovascular: Normal rate, regular rhythm - not irregular today, normal heart sounds and intact distal pulses.   Pulmonary/Chest: Effort normal and breath sounds normal.  Abdominal: Soft. Bowel sounds are normal. There is no tenderness.  Musculoskeletal: Normal range of motion. Exhibits chronic 1+ bilat  LE edema to mid calves Lymphadenopathy:  Has no cervical adenopathy.  Neurological: Pt is alert and oriented to person, place, and time. Pt has normal reflexes. Not confused, motor grossly intact.  Skin: Skin is warm and dry. No rash noted.  Psychiatric:  Has mild dysphoric affect. Behavior is normal.     Assessment & Plan:

## 2012-01-11 NOTE — Assessment & Plan Note (Signed)

## 2012-01-11 NOTE — Assessment & Plan Note (Signed)
stable overall by hx and exam, most recent data reviewed with pt, and pt to continue medical treatment as before Lab Results  Component Value Date   HGBA1C 6.5 05/02/2012    

## 2012-01-12 ENCOUNTER — Encounter: Payer: Self-pay | Admitting: Internal Medicine

## 2012-01-12 ENCOUNTER — Ambulatory Visit (INDEPENDENT_AMBULATORY_CARE_PROVIDER_SITE_OTHER): Payer: Medicare Other | Admitting: Pharmacist

## 2012-01-12 DIAGNOSIS — I2789 Other specified pulmonary heart diseases: Secondary | ICD-10-CM

## 2012-01-12 DIAGNOSIS — I4891 Unspecified atrial fibrillation: Secondary | ICD-10-CM

## 2012-01-12 LAB — MICROALBUMIN / CREATININE URINE RATIO
Creatinine,U: 230.5 mg/dL
Microalb, Ur: 23.6 mg/dL — ABNORMAL HIGH (ref 0.0–1.9)

## 2012-01-12 LAB — BASIC METABOLIC PANEL
CO2: 25 mEq/L (ref 19–32)
Chloride: 109 mEq/L (ref 96–112)
Creatinine, Ser: 1.3 mg/dL — ABNORMAL HIGH (ref 0.4–1.2)
Potassium: 5.1 mEq/L (ref 3.5–5.1)
Sodium: 146 mEq/L — ABNORMAL HIGH (ref 135–145)

## 2012-01-12 LAB — HEPATIC FUNCTION PANEL
ALT: 40 U/L — ABNORMAL HIGH (ref 0–35)
AST: 32 U/L (ref 0–37)
Alkaline Phosphatase: 52 U/L (ref 39–117)
Bilirubin, Direct: 0.1 mg/dL (ref 0.0–0.3)
Total Protein: 6.6 g/dL (ref 6.0–8.3)

## 2012-01-12 LAB — URINALYSIS, ROUTINE W REFLEX MICROSCOPIC
Bilirubin Urine: NEGATIVE
Hgb urine dipstick: NEGATIVE
Nitrite: POSITIVE
Urobilinogen, UA: 0.2 (ref 0.0–1.0)

## 2012-01-12 LAB — LIPID PANEL
Total CHOL/HDL Ratio: 2
Triglycerides: 38 mg/dL (ref 0.0–149.0)

## 2012-02-08 ENCOUNTER — Ambulatory Visit (INDEPENDENT_AMBULATORY_CARE_PROVIDER_SITE_OTHER): Payer: Medicare Other | Admitting: *Deleted

## 2012-02-08 DIAGNOSIS — I4891 Unspecified atrial fibrillation: Secondary | ICD-10-CM

## 2012-02-08 DIAGNOSIS — I2789 Other specified pulmonary heart diseases: Secondary | ICD-10-CM

## 2012-02-08 LAB — POCT INR: INR: 1.9

## 2012-02-29 ENCOUNTER — Ambulatory Visit (INDEPENDENT_AMBULATORY_CARE_PROVIDER_SITE_OTHER): Payer: Medicare Other | Admitting: *Deleted

## 2012-02-29 DIAGNOSIS — I4891 Unspecified atrial fibrillation: Secondary | ICD-10-CM

## 2012-02-29 DIAGNOSIS — I2789 Other specified pulmonary heart diseases: Secondary | ICD-10-CM

## 2012-03-01 ENCOUNTER — Encounter: Payer: Self-pay | Admitting: General Practice

## 2012-03-13 ENCOUNTER — Encounter: Payer: Self-pay | Admitting: Gastroenterology

## 2012-03-13 ENCOUNTER — Other Ambulatory Visit: Payer: Self-pay

## 2012-03-13 MED ORDER — HYDROCODONE-ACETAMINOPHEN 5-325 MG PO TABS: 1.0000 | ORAL_TABLET | Freq: Three times a day (TID) | ORAL | Status: DC | PRN

## 2012-03-13 NOTE — Telephone Encounter (Signed)
Done hardcopy to robin  

## 2012-03-13 NOTE — Telephone Encounter (Signed)
Faxed hardcopy to pharmacy. 

## 2012-03-23 ENCOUNTER — Encounter: Payer: Self-pay | Admitting: Cardiology

## 2012-03-23 ENCOUNTER — Ambulatory Visit (INDEPENDENT_AMBULATORY_CARE_PROVIDER_SITE_OTHER): Payer: Medicare Other | Admitting: Cardiology

## 2012-03-23 VITALS — BP 150/70 | HR 63 | Ht 64.0 in | Wt 160.4 lb

## 2012-03-23 DIAGNOSIS — I10 Essential (primary) hypertension: Secondary | ICD-10-CM

## 2012-03-23 DIAGNOSIS — I4891 Unspecified atrial fibrillation: Secondary | ICD-10-CM

## 2012-03-23 DIAGNOSIS — E785 Hyperlipidemia, unspecified: Secondary | ICD-10-CM

## 2012-03-23 DIAGNOSIS — I50810 Right heart failure, unspecified: Secondary | ICD-10-CM

## 2012-03-23 DIAGNOSIS — I2789 Other specified pulmonary heart diseases: Secondary | ICD-10-CM

## 2012-03-23 DIAGNOSIS — I509 Heart failure, unspecified: Secondary | ICD-10-CM

## 2012-03-23 NOTE — Progress Notes (Signed)
HPI The patient presents for followup of predominantly right-sided heart failure and atrial fibrillation. She has been doing relatively well since we last saw her. She's not had any symptomatic atrial fibrillation.  She actually appears to be her regular rhythm currently. She's had no acute shortness of breath, PND or orthopnea. She's had continued mild lower strandy swelling but this is treated at keeping her feet elevated. She has not had any chest pressure, neck or arm discomfort. She's had no new PND or orthopnea.  Allergies  Allergen Reactions  . Clonidine Derivatives Rash    Skin breaks out    Current Outpatient Prescriptions  Medication Sig Dispense Refill  . ALPRAZolam (XANAX) 0.25 MG tablet Take 0.25 mg by mouth 2 (two) times daily as needed. For anxiety      . Cholecalciferol (VITAMIN D) 1000 UNITS capsule Take 1,000 Units by mouth daily.       . citalopram (CELEXA) 20 MG tablet TAKE 1 TABLET EVERY DAY  30 tablet  6  . diltiazem (CARDIZEM CD) 120 MG 24 hr capsule Take 120 mg by mouth daily.       Marland Kitchen docusate sodium (COLACE) 100 MG capsule Take 100 mg by mouth 2 (two) times daily as needed. For constipation       . furosemide (LASIX) 40 MG tablet Take 60 mg by mouth daily.       . hydrALAZINE (APRESOLINE) 50 MG tablet Take 1 tablet (50 mg total) by mouth 3 (three) times daily.  270 tablet  3  . HYDROcodone-acetaminophen (NORCO/VICODIN) 5-325 MG per tablet Take 1 tablet by mouth every 8 (eight) hours as needed. For pain  90 tablet  3  . insulin aspart (NOVOLOG) 100 UNIT/ML injection Inject 5 Units into the skin 3 (three) times daily before meals. If CBG is greater than or equal to 150, give 5 units       . labetalol (NORMODYNE) 300 MG tablet Take 300 mg by mouth 2 (two) times daily.        Marland Kitchen levothyroxine (SYNTHROID, LEVOTHROID) 25 MCG tablet Take 1 tablet (25 mcg total) by mouth daily.  30 tablet  10  . lidocaine (LIDODERM) 5 % Place 1 patch onto the skin daily. Remove & Discard  patch within 12 hours or as directed by MD       . lovastatin (MEVACOR) 40 MG tablet        . lovastatin (MEVACOR) 40 MG tablet TAKE 2 TABLETS BY MOUTH EVERY DAY  60 tablet  0  . metFORMIN (GLUCOPHAGE) 500 MG tablet Take 1 tablet (500 mg total) by mouth daily.  90 tablet  3  . senna (SENOKOT) 8.6 MG TABS Take 2 tablets by mouth daily as needed. For constipation       . sorbitol 70 % solution Take 25 mLs by mouth daily as needed. For constipation       . warfarin (COUMADIN) 5 MG tablet Take 5 mg by mouth daily.        Marland Kitchen DISCONTD: diltiazem (CARDIZEM CD) 120 MG 24 hr capsule TAKE ONE CAPSULE BY MOUTH EVERY DAY  30 capsule  5  . DISCONTD: diltiazem (CARDIZEM CD) 240 MG 24 hr capsule Take 240 mg by mouth daily.        Marland Kitchen DISCONTD: warfarin (COUMADIN) 5 MG tablet USE AS DIRECTED  50 tablet  5    Past Medical History  Diagnosis Date  . Obesity   . Anemia, iron deficiency   . Depression   .  Diabetes mellitus type II   . Hyperlipidemia   . Hypertension   . Peptic ulcer   . History of uterine fibroid   . Hx of colonic polyps   . Diverticula, colon   . Osteoarthritis   . Low back pain   . Atrial fibrillation     permanent, on coumadin (followed by [facility])  . Pulmonary hypertension     echo 4/12:  EF 55-60%, mod LVH, mild BAE, RVE and PASP 51  . Anxiety   . DIABETES MELLITUS, TYPE II 03/05/2007  . HYPERLIPIDEMIA 03/05/2007  . ANEMIA-IRON DEFICIENCY 03/05/2007  . HYPERTENSION 03/05/2007  . DEPRESSION 03/05/2007  . LOW BACK PAIN 03/05/2007  . PERIPHERAL EDEMA 04/29/2008  . Chronic pain syndrome 06/23/2010  . FIBROIDS, UTERUS 03/05/2007  . PEPTIC ULCER DISEASE 03/05/2007  . DIVERTICULOSIS, COLON 03/05/2007  . OSTEOARTHRITIS 03/05/2007  . COLONIC POLYPS, HX OF 03/05/2007  . Hypothyroidism 10/15/2010  . Renal insufficiency 10/15/2010    Past Surgical History  Procedure Date  . Laparoscopic hysterectomy     ROS:  As stated in the HPI and negative for all other systems.  PHYSICAL EXAM BP  150/70  Pulse 63  Ht 5\' 4"  (1.626 m)  Wt 160 lb 6.4 oz (72.757 kg)  BMI 27.53 kg/m2 PHYSICAL EXAM GEN:  No distress NECK:  No jugular venous distention at 90 degrees, waveform within normal limits, carotid upstroke brisk and symmetric, no bruits, no thyromegaly LYMPHATICS:  No cervical adenopathy LUNGS:  Clear to auscultation bilaterally BACK:  No CVA tenderness CHEST:  Unremarkable HEART:  S1 and S2 within normal limits, no S3, no S4, no clicks, no rubs, no murmurs ABD:  Positive bowel sounds normal in frequency in pitch, no bruits, no rebound, no guarding, unable to assess midline mass or bruit with the patient seated. EXT:  2 plus pulses throughout, mild bilateral lower extremity edema, no cyanosis no clubbing SKIN:  No rashes no nodules NEURO:  Cranial nerves II through XII grossly intact, motor grossly intact throughout PSYCH:  Cognitively intact, oriented to person place and time  EKG:  Probable ectopic atrial rhythm, right bundle branch block, QTC prolonged. No acute ST-T wave changes. 03/23/2012  ASSESSMENT AND PLAN  FIBRILLATION, ATRIAL -  She has not had any symptomatic paroxysms. She tolerates her blood her. No change in therapy is indicated.  HYPERTENSION -  Her blood pressure is very slightly elevated today. However, this is the highest blood pressure that we have reported in a while. At this point no change in therapy is indicated.  PERIPHERAL EDEMA -  She seems to be relatively euvolemic.  Her edema goes way down when she eats her feet elevated and she is very good about this. No change in therapy is indicated..  Renal insufficiency -  I reviewed her labs and her creatinine was stable. No further testing is indicated.

## 2012-03-23 NOTE — Patient Instructions (Addendum)
The current medical regimen is effective;  continue present plan and medications.  Follow up in 1 year with Dr Hochrein.  You will receive a letter in the mail 2 months before you are due.  Please call us when you receive this letter to schedule your follow up appointment.  

## 2012-03-28 ENCOUNTER — Ambulatory Visit (INDEPENDENT_AMBULATORY_CARE_PROVIDER_SITE_OTHER): Payer: Medicare Other

## 2012-03-28 DIAGNOSIS — I2789 Other specified pulmonary heart diseases: Secondary | ICD-10-CM

## 2012-03-28 DIAGNOSIS — I4891 Unspecified atrial fibrillation: Secondary | ICD-10-CM

## 2012-04-18 ENCOUNTER — Ambulatory Visit (INDEPENDENT_AMBULATORY_CARE_PROVIDER_SITE_OTHER): Payer: Medicare Other | Admitting: *Deleted

## 2012-04-18 DIAGNOSIS — I4891 Unspecified atrial fibrillation: Secondary | ICD-10-CM

## 2012-04-18 DIAGNOSIS — I2789 Other specified pulmonary heart diseases: Secondary | ICD-10-CM

## 2012-04-18 LAB — POCT INR: INR: 2.3

## 2012-05-05 ENCOUNTER — Other Ambulatory Visit: Payer: Self-pay | Admitting: Internal Medicine

## 2012-05-09 ENCOUNTER — Other Ambulatory Visit: Payer: Self-pay | Admitting: Internal Medicine

## 2012-05-16 ENCOUNTER — Ambulatory Visit (INDEPENDENT_AMBULATORY_CARE_PROVIDER_SITE_OTHER): Payer: Medicare Other | Admitting: General Practice

## 2012-05-16 DIAGNOSIS — I4891 Unspecified atrial fibrillation: Secondary | ICD-10-CM

## 2012-05-16 DIAGNOSIS — I2789 Other specified pulmonary heart diseases: Secondary | ICD-10-CM

## 2012-05-16 LAB — POCT INR: INR: 2.2

## 2012-06-04 ENCOUNTER — Other Ambulatory Visit: Payer: Self-pay | Admitting: Internal Medicine

## 2012-06-09 ENCOUNTER — Other Ambulatory Visit: Payer: Self-pay | Admitting: Internal Medicine

## 2012-06-13 ENCOUNTER — Ambulatory Visit (INDEPENDENT_AMBULATORY_CARE_PROVIDER_SITE_OTHER): Payer: Medicare Other | Admitting: General Practice

## 2012-06-13 DIAGNOSIS — I4891 Unspecified atrial fibrillation: Secondary | ICD-10-CM

## 2012-06-13 DIAGNOSIS — I2789 Other specified pulmonary heart diseases: Secondary | ICD-10-CM

## 2012-06-29 ENCOUNTER — Other Ambulatory Visit: Payer: Self-pay | Admitting: Internal Medicine

## 2012-07-02 ENCOUNTER — Other Ambulatory Visit: Payer: Self-pay | Admitting: Internal Medicine

## 2012-07-04 ENCOUNTER — Telehealth: Payer: Self-pay | Admitting: Cardiology

## 2012-07-04 NOTE — Telephone Encounter (Signed)
4/12  55-60% by echo

## 2012-07-04 NOTE — Telephone Encounter (Signed)
New problem:  [id number]- reference #    Need the last EJ fraction.

## 2012-07-04 NOTE — Telephone Encounter (Signed)
Called and information given.

## 2012-07-11 ENCOUNTER — Ambulatory Visit (INDEPENDENT_AMBULATORY_CARE_PROVIDER_SITE_OTHER): Payer: Medicare Other | Admitting: General Practice

## 2012-07-11 DIAGNOSIS — I2789 Other specified pulmonary heart diseases: Secondary | ICD-10-CM

## 2012-07-11 DIAGNOSIS — I4891 Unspecified atrial fibrillation: Secondary | ICD-10-CM

## 2012-07-11 LAB — POCT INR: INR: 2.5

## 2012-07-13 ENCOUNTER — Ambulatory Visit (INDEPENDENT_AMBULATORY_CARE_PROVIDER_SITE_OTHER): Payer: Medicare Other | Admitting: Internal Medicine

## 2012-07-13 ENCOUNTER — Encounter: Payer: Self-pay | Admitting: Internal Medicine

## 2012-07-13 VITALS — BP 140/80 | HR 93 | Temp 99.1°F | Wt 163.0 lb

## 2012-07-13 DIAGNOSIS — E119 Type 2 diabetes mellitus without complications: Secondary | ICD-10-CM

## 2012-07-13 DIAGNOSIS — Z23 Encounter for immunization: Secondary | ICD-10-CM

## 2012-07-13 DIAGNOSIS — I10 Essential (primary) hypertension: Secondary | ICD-10-CM

## 2012-07-13 DIAGNOSIS — R509 Fever, unspecified: Secondary | ICD-10-CM | POA: Insufficient documentation

## 2012-07-13 DIAGNOSIS — F329 Major depressive disorder, single episode, unspecified: Secondary | ICD-10-CM

## 2012-07-13 NOTE — Assessment & Plan Note (Signed)
stable overall by history and exam, recent data reviewed with pt, and pt to continue medical treatment as before,  to f/u any worsening symptoms or concerns Lab Results  Component Value Date   HGBA1C 6.1 01/11/2012

## 2012-07-13 NOTE — Assessment & Plan Note (Signed)
stable overall by history and exam, recent data reviewed with pt, and pt to continue medical treatment as before,  to f/u any worsening symptoms or concerns Lab Results  Component Value Date   WBC 3.8* 01/11/2012   HGB 10.1* 01/11/2012   HCT 30.3* 01/11/2012   PLT 132.0* 01/11/2012   GLUCOSE 112* 01/11/2012   CHOL 152 01/11/2012   TRIG 38.0 01/11/2012   HDL 77.40 01/11/2012   LDLDIRECT 136.2 06/24/2009   LDLCALC 67 01/11/2012   ALT 40* 01/11/2012   AST 32 01/11/2012   NA 146* 01/11/2012   K 5.1 01/11/2012   CL 109 01/11/2012   CREATININE 1.3* 01/11/2012   BUN 27* 01/11/2012   CO2 25 01/11/2012   TSH 1.10 01/11/2012   INR 2.5 07/11/2012   HGBA1C 6.1 01/11/2012   MICROALBUR 23.6* 01/11/2012

## 2012-07-13 NOTE — Assessment & Plan Note (Signed)
stable overall by history and exam, recent data reviewed with pt, and pt to continue medical treatment as before,  to f/u any worsening symptoms or concerns BP Readings from Last 3 Encounters:  07/13/12 140/80  03/23/12 150/70  01/11/12 142/78

## 2012-07-13 NOTE — Patient Instructions (Addendum)
You had the flu shot today Please re-start the compression stockings for use during the day Please continue all other medications as before, and refills have been done if requested. Please have the pharmacy call with any other refills you may need. Please go to the LAB in the Basement (turn left off the elevator) for the tests to be done today You will be contacted by phone if any changes need to be made immediately.  Otherwise, you will receive a letter about your results with an explanation Please remember to sign up for My Chart if you have not done so, as this will be important to you in the future with finding out test results, communicating by private email, and scheduling acute appointments online when needed. Please return in 6 months, or sooner if needed, with Lab testing done 3-5 days before

## 2012-07-13 NOTE — Assessment & Plan Note (Signed)
?   Clinical signficance, exam benign, for UA today

## 2012-07-13 NOTE — Progress Notes (Signed)
Subjective:    Patient ID: [name], female    DOB: 10-Apr-1932, 79 y.o.   MRN: [id number]  HPI  Here with family, Here to f/u; overall doing ok,  Pt denies chest pain, increased sob or doe, wheezing, orthopnea, PND, increased LE swelling, palpitations, dizziness or syncope.  Pt denies polydipsia, polyuria, or low sugar symptoms such as weakness or confusion improved with po intake.  Pt denies new neurological symptoms such as new headache, or facial or extremity weakness or numbness.   Pt states overall good compliance with meds, has been trying to follow lower cholesterol, diabetic diet, with overall stable,  but little exercise however. Has seen cardiology 2013, felt stable.   Pt denies fever, wt loss, night sweats, loss of appetite, or other constitutional symptoms, though does have low grade temp today.  Denies urinary symptoms such as dysuria, frequency, urgency, flank pain, hematuria or n/v, fever, chills. Denies worsening depressive symptoms, suicidal ideation, or panic; has ongoing anxiety, ok recently Past Medical History  Diagnosis Date  . Obesity   . Anemia, iron deficiency   . Depression   . Diabetes mellitus type II   . Hyperlipidemia   . Hypertension   . Peptic ulcer   . History of uterine fibroid   . Hx of colonic polyps   . Diverticula, colon   . Osteoarthritis   . Low back pain   . Atrial fibrillation     permanent, on coumadin (followed by [facility])  . Pulmonary hypertension     echo 4/12:  EF 55-60%, mod LVH, mild BAE, RVE and PASP 51  . Anxiety   . DIABETES MELLITUS, TYPE II 03/05/2007  . HYPERLIPIDEMIA 03/05/2007  . ANEMIA-IRON DEFICIENCY 03/05/2007  . HYPERTENSION 03/05/2007  . DEPRESSION 03/05/2007  . LOW BACK PAIN 03/05/2007  . PERIPHERAL EDEMA 04/29/2008  . Chronic pain syndrome 06/23/2010  . FIBROIDS, UTERUS 03/05/2007  . PEPTIC ULCER DISEASE 03/05/2007  . DIVERTICULOSIS, COLON 03/05/2007  . OSTEOARTHRITIS 03/05/2007  . COLONIC POLYPS, HX OF 03/05/2007  .  Hypothyroidism 10/15/2010  . Renal insufficiency 10/15/2010   Past Surgical History  Procedure Date  . Laparoscopic hysterectomy     reports that she has quit smoking. She started smoking about 40 years ago. She does not have any smokeless tobacco history on file. She reports that she does not drink alcohol or use illicit drugs. family history includes Hypertension in her sister. Allergies  Allergen Reactions  . Clonidine Derivatives Rash    Skin breaks out   Current Outpatient Prescriptions on File Prior to Visit  Medication Sig Dispense Refill  . ALPRAZolam (XANAX) 0.25 MG tablet Take 0.25 mg by mouth 2 (two) times daily as needed. For anxiety      . Cholecalciferol (VITAMIN D) 1000 UNITS capsule Take 1,000 Units by mouth daily.       . citalopram (CELEXA) 20 MG tablet TAKE 1 TABLET EVERY DAY  30 tablet  6  . diltiazem (CARDIZEM CD) 120 MG 24 hr capsule TAKE ONE CAPSULE BY MOUTH EVERY DAY  30 capsule  7  . docusate sodium (COLACE) 100 MG capsule Take 100 mg by mouth 2 (two) times daily as needed. For constipation       . furosemide (LASIX) 40 MG tablet Take 60 mg by mouth daily.       . hydrALAZINE (APRESOLINE) 50 MG tablet Take 1 tablet (50 mg total) by mouth 3 (three) times daily.  270 tablet  3  . HYDROcodone-acetaminophen (  NORCO/VICODIN) 5-325 MG per tablet Take 1 tablet by mouth every 8 (eight) hours as needed. For pain  90 tablet  3  . insulin aspart (NOVOLOG) 100 UNIT/ML injection Inject 5 Units into the skin 3 (three) times daily before meals. If CBG is greater than or equal to 150, give 5 units       . labetalol (NORMODYNE) 300 MG tablet TAKE 1 TABLET TWICE A DAY  60 tablet  2  . levothyroxine (SYNTHROID, LEVOTHROID) 25 MCG tablet Take 1 tablet (25 mcg total) by mouth daily.  30 tablet  10  . lidocaine (LIDODERM) 5 % Place 1 patch onto the skin daily. Remove & Discard patch within 12 hours or as directed by MD       . lovastatin (MEVACOR) 40 MG tablet TAKE 2 TABLETS BY MOUTH  EVERY DAY  60 tablet  5  . metFORMIN (GLUCOPHAGE) 500 MG tablet TAKE 1 TABLET BY MOUTH EVERY DAY  90 tablet  3  . senna (SENOKOT) 8.6 MG TABS Take 2 tablets by mouth daily as needed. For constipation       . sorbitol 70 % solution Take 25 mLs by mouth daily as needed. For constipation       . warfarin (COUMADIN) 5 MG tablet Take 5 mg by mouth daily.        Marland Kitchen warfarin (COUMADIN) 5 MG tablet USE AS DIRECTED  50 tablet  5  . diltiazem (CARDIZEM CD) 120 MG 24 hr capsule Take 120 mg by mouth daily.       Marland Kitchen lovastatin (MEVACOR) 40 MG tablet         Review of Systems  Constitutional: Negative for unexpected weight change, or unusual diaphoresis  HENT: Negative for tinnitus.   Eyes: Negative for photophobia and visual disturbance.  Respiratory: Negative for choking and stridor.   Gastrointestinal: Negative for vomiting and blood in stool.  Genitourinary: Negative for hematuria and decreased urine volume.  Musculoskeletal: Negative for acute joint swelling Skin: Negative for color change and wound.  Neurological: Negative for tremors and numbness other than noted  Psychiatric/Behavioral: Negative for decreased concentration or  hyperactivity.       Objective:   Physical Exam BP 140/80  Pulse 93  Temp 99.1 F (37.3 C) (Oral)  Wt 163 lb (73.936 kg)  SpO2 99% VS noted, not ill appearing Constitutional: Pt appears well-developed and well-nourished.  HENT: Head: NCAT.  Right Ear: External ear normal.  Left Ear: External ear normal.  Eyes: Conjunctivae and EOM are normal. Pupils are equal, round, and reactive to light.  Neck: Normal range of motion. Neck supple.  Cardiovascular: Normal rate and regular rhythm.   Pulmonary/Chest: Effort normal and breath sounds normal.  Abd:  Soft , NT, + BS, no flank tender Neurological: Pt is alert. Not confused  Skin: Skin is warm. No erythema.  Psychiatric: Pt behavior is normal. Thought content normal. mild nervous, not depressed affect    Assessment  & Plan:

## 2012-07-18 ENCOUNTER — Telehealth: Payer: Self-pay | Admitting: Internal Medicine

## 2012-07-18 ENCOUNTER — Other Ambulatory Visit (INDEPENDENT_AMBULATORY_CARE_PROVIDER_SITE_OTHER): Payer: Medicare Other

## 2012-07-18 DIAGNOSIS — R509 Fever, unspecified: Secondary | ICD-10-CM

## 2012-07-18 DIAGNOSIS — E119 Type 2 diabetes mellitus without complications: Secondary | ICD-10-CM

## 2012-07-18 LAB — BASIC METABOLIC PANEL
BUN: 19 mg/dL (ref 6–23)
Calcium: 9.7 mg/dL (ref 8.4–10.5)
GFR: 51.32 mL/min — ABNORMAL LOW (ref >60.00)
Glucose, Bld: 104 mg/dL — ABNORMAL HIGH (ref 70–99)
Sodium: 139 mEq/L (ref 135–145)

## 2012-07-18 LAB — URINALYSIS, ROUTINE W REFLEX MICROSCOPIC
Bilirubin Urine: NEGATIVE
Hgb urine dipstick: NEGATIVE
Nitrite: POSITIVE
Total Protein, Urine: NEGATIVE
Urobilinogen, UA: 0.2 (ref 0.0–1.0)

## 2012-07-18 LAB — LIPID PANEL
HDL: 61.8 mg/dL (ref >39.00)
LDL Cholesterol: 59 mg/dL (ref 0–99)
VLDL: 10.4 mg/dL (ref 0.0–40.0)

## 2012-07-18 LAB — HEPATIC FUNCTION PANEL
Albumin: 3.9 g/dL (ref 3.5–5.2)
Alkaline Phosphatase: 56 U/L (ref 39–117)
Total Bilirubin: 1 mg/dL (ref 0.3–1.2)

## 2012-07-18 MED ORDER — CEPHALEXIN 500 MG PO CAPS: 500.0000 mg | ORAL_CAPSULE | Freq: Four times a day (QID) | ORAL | Status: DC

## 2012-07-18 NOTE — Telephone Encounter (Signed)
Pt with recent fever on exam jan 16  Now with UA c/w prob uti   OK for antibx  - done erx  Robin to notify pt

## 2012-07-18 NOTE — Telephone Encounter (Signed)
Patient informed. 

## 2012-08-22 ENCOUNTER — Ambulatory Visit (INDEPENDENT_AMBULATORY_CARE_PROVIDER_SITE_OTHER): Payer: Medicare Other | Admitting: General Practice

## 2012-08-22 DIAGNOSIS — I4891 Unspecified atrial fibrillation: Secondary | ICD-10-CM

## 2012-08-22 LAB — POCT INR: INR: 2.2

## 2012-08-29 ENCOUNTER — Other Ambulatory Visit: Payer: Self-pay | Admitting: Internal Medicine

## 2012-10-03 ENCOUNTER — Ambulatory Visit (INDEPENDENT_AMBULATORY_CARE_PROVIDER_SITE_OTHER): Payer: Medicare Other | Admitting: General Practice

## 2012-10-03 DIAGNOSIS — I4891 Unspecified atrial fibrillation: Secondary | ICD-10-CM

## 2012-10-03 DIAGNOSIS — I2789 Other specified pulmonary heart diseases: Secondary | ICD-10-CM

## 2012-10-06 ENCOUNTER — Other Ambulatory Visit: Payer: Self-pay | Admitting: Internal Medicine

## 2012-10-09 NOTE — Telephone Encounter (Signed)
Faxed hardcopy to pharmacy. 

## 2012-10-09 NOTE — Telephone Encounter (Signed)
Done hardcopy to robin  

## 2012-10-17 ENCOUNTER — Encounter: Payer: Self-pay | Admitting: Internal Medicine

## 2012-11-04 ENCOUNTER — Other Ambulatory Visit: Payer: Self-pay | Admitting: Internal Medicine

## 2012-11-14 ENCOUNTER — Ambulatory Visit (INDEPENDENT_AMBULATORY_CARE_PROVIDER_SITE_OTHER): Payer: Medicare Other | Admitting: General Practice

## 2012-11-14 DIAGNOSIS — I2789 Other specified pulmonary heart diseases: Secondary | ICD-10-CM

## 2012-11-14 DIAGNOSIS — I4891 Unspecified atrial fibrillation: Secondary | ICD-10-CM

## 2012-11-14 LAB — POCT INR: INR: 3.5

## 2012-11-24 ENCOUNTER — Other Ambulatory Visit: Payer: Self-pay | Admitting: Internal Medicine

## 2012-11-28 ENCOUNTER — Encounter: Payer: Self-pay | Admitting: Internal Medicine

## 2012-11-28 ENCOUNTER — Ambulatory Visit (INDEPENDENT_AMBULATORY_CARE_PROVIDER_SITE_OTHER): Payer: Medicare Other | Admitting: Internal Medicine

## 2012-11-28 VITALS — BP 152/90 | HR 89 | Temp 100.6°F | Wt 163.0 lb

## 2012-11-28 DIAGNOSIS — E785 Hyperlipidemia, unspecified: Secondary | ICD-10-CM

## 2012-11-28 DIAGNOSIS — L03119 Cellulitis of unspecified part of limb: Secondary | ICD-10-CM

## 2012-11-28 DIAGNOSIS — I10 Essential (primary) hypertension: Secondary | ICD-10-CM

## 2012-11-28 DIAGNOSIS — L03116 Cellulitis of left lower limb: Secondary | ICD-10-CM

## 2012-11-28 DIAGNOSIS — Z0000 Encounter for general adult medical examination without abnormal findings: Secondary | ICD-10-CM

## 2012-11-28 DIAGNOSIS — E119 Type 2 diabetes mellitus without complications: Secondary | ICD-10-CM

## 2012-11-28 DIAGNOSIS — M25561 Pain in right knee: Secondary | ICD-10-CM

## 2012-11-28 DIAGNOSIS — M25569 Pain in unspecified knee: Secondary | ICD-10-CM

## 2012-11-28 DIAGNOSIS — R609 Edema, unspecified: Secondary | ICD-10-CM

## 2012-11-28 MED ORDER — CEPHALEXIN 500 MG PO CAPS: 500.0000 mg | ORAL_CAPSULE | Freq: Four times a day (QID) | ORAL | Status: DC

## 2012-11-28 NOTE — Progress Notes (Signed)
Subjective:    Patient ID: [name], female    DOB: Sep 10, 1931, 79 y.o.   MRN: [id number]  HPI  Here after worsening bilat knee pain right > left pain and swelling with giveaway, high risk for fall, worse in the past 2-3 months, essentially cannot stand, Too much pain and tendency to fall to check daily wts at home.  Has large knee effusions, not clear if wt increased but LE edema increased as well.  Had developed worsening left distal lateral blister in the past wk, with rupture 3 days ago, now red/sweling/tender near the site, without fever, red streaks, chills or confusion.  Pt denies chest pain, increased sob or doe, wheezing, orthopnea, PND, palpitations, dizziness or syncope. Pt denies new neurological symptoms such as new headache, or facial or extremity weakness or numbness   Pt denies polydipsia, polyuria, or low sugar symptoms. INR slightly elevated recently Past Medical History  Diagnosis Date  . Obesity   . Anemia, iron deficiency   . Depression   . Diabetes mellitus type II   . Hyperlipidemia   . Hypertension   . Peptic ulcer   . History of uterine fibroid   . Hx of colonic polyps   . Diverticula, colon   . Osteoarthritis   . Low back pain   . Atrial fibrillation     permanent, on coumadin (followed by [facility])  . Pulmonary hypertension     echo 4/12:  EF 55-60%, mod LVH, mild BAE, RVE and PASP 51  . Anxiety   . DIABETES MELLITUS, TYPE II 03/05/2007  . HYPERLIPIDEMIA 03/05/2007  . ANEMIA-IRON DEFICIENCY 03/05/2007  . HYPERTENSION 03/05/2007  . DEPRESSION 03/05/2007  . LOW BACK PAIN 03/05/2007  . PERIPHERAL EDEMA 04/29/2008  . Chronic pain syndrome 06/23/2010  . FIBROIDS, UTERUS 03/05/2007  . PEPTIC ULCER DISEASE 03/05/2007  . DIVERTICULOSIS, COLON 03/05/2007  . OSTEOARTHRITIS 03/05/2007  . COLONIC POLYPS, HX OF 03/05/2007  . Hypothyroidism 10/15/2010  . Renal insufficiency 10/15/2010   Past Surgical History  Procedure Laterality Date  . Laparoscopic hysterectomy      reports that she has quit smoking. She started smoking about 40 years ago. She does not have any smokeless tobacco history on file. She reports that she does not drink alcohol or use illicit drugs. family history includes Hypertension in her sister. Allergies  Allergen Reactions  . Clonidine Derivatives Rash    Skin breaks out   Current Outpatient Prescriptions on File Prior to Visit  Medication Sig Dispense Refill  . ALPRAZolam (XANAX) 0.25 MG tablet Take 0.25 mg by mouth 2 (two) times daily as needed. For anxiety      . Cholecalciferol (VITAMIN D) 1000 UNITS capsule Take 1,000 Units by mouth daily.       . citalopram (CELEXA) 20 MG tablet TAKE 1 TABLET EVERY DAY  30 tablet  6  . diltiazem (CARDIZEM CD) 120 MG 24 hr capsule TAKE ONE CAPSULE BY MOUTH EVERY DAY  30 capsule  7  . docusate sodium (COLACE) 100 MG capsule Take 100 mg by mouth 2 (two) times daily as needed. For constipation       . furosemide (LASIX) 40 MG tablet Take 60 mg by mouth daily.       . hydrALAZINE (APRESOLINE) 50 MG tablet TAKE 1 TABLET BY MOUTH 3 TIMES A DAY  270 tablet  3  . HYDROcodone-acetaminophen (NORCO/VICODIN) 5-325 MG per tablet TAKE 1 TABLET BY MOUTH EVERY 8 HOURS AS NEEDED FOR PAIN  90 tablet  2  . insulin aspart (NOVOLOG) 100 UNIT/ML injection Inject 5 Units into the skin 3 (three) times daily before meals. If CBG is greater than or equal to 150, give 5 units       . labetalol (NORMODYNE) 300 MG tablet TAKE 1 TABLET BY MOUTH TWICE A DAY  60 tablet  6  . levothyroxine (SYNTHROID, LEVOTHROID) 25 MCG tablet TAKE 1 TABLET BY MOUTH DAILY  30 tablet  10  . lidocaine (LIDODERM) 5 % Place 1 patch onto the skin daily. Remove & Discard patch within 12 hours or as directed by MD       . lovastatin (MEVACOR) 40 MG tablet TAKE 2 TABLETS BY MOUTH EVERY DAY  60 tablet  5  . metFORMIN (GLUCOPHAGE) 500 MG tablet TAKE 1 TABLET BY MOUTH EVERY DAY  90 tablet  3  . senna (SENOKOT) 8.6 MG TABS Take 2 tablets by mouth daily as  needed. For constipation       . sorbitol 70 % solution Take 25 mLs by mouth daily as needed. For constipation       . warfarin (COUMADIN) 5 MG tablet Take 5 mg by mouth daily.        Marland Kitchen warfarin (COUMADIN) 5 MG tablet USE AS DIRECTED  50 tablet  5  . diltiazem (CARDIZEM CD) 120 MG 24 hr capsule Take 120 mg by mouth daily.       Marland Kitchen lovastatin (MEVACOR) 40 MG tablet         No current facility-administered medications on file prior to visit.   Review of Systems  Constitutional: Negative for unexpected weight change, or unusual diaphoresis  HENT: Negative for tinnitus.   Eyes: Negative for photophobia and visual disturbance.  Respiratory: Negative for choking and stridor.   Gastrointestinal: Negative for vomiting and blood in stool.  Genitourinary: Negative for hematuria and decreased urine volume.  Musculoskeletal: Negative for acute joint swelling Skin: Negative for color change and wound.  Neurological: Negative for tremors and numbness other than noted  Psychiatric/Behavioral: Negative for decreased concentration or  hyperactivity.       Objective:   Physical Exam BP 152/90  Pulse 89  Temp(Src) 100.6 F (38.1 C) (Oral)  Wt 163 lb (73.936 kg)  BMI 27.97 kg/m2  SpO2 98% VS noted, fatigued but nontoxic Constitutional: Pt appears well-developed and well-nourished.  HENT: Head: NCAT.  Right Ear: External ear normal.  Left Ear: External ear normal.  Eyes: Conjunctivae and EOM are normal. Pupils are equal, round, and reactive to light.  Neck: Normal range of motion. Neck supple.  Cardiovascular: Normal rate and irregular rhythm.   Pulmonary/Chest: Effort normal and breath sounds decreased, no frank rales/wheezin Neurological: Pt is alert. Not confused  bilat knees with 2+ effusions Skin: LE with 1-2+ edema, left lateral mid leg with 1.5 cm very superficial uncovered blister area with clearish weepiness, no fluctuance or other drainage, no bleeding or bruising; but has 3-4 cm area  contiguous with wound but located anteriorly 1+ bilat dorsalis pedis Psychiatric: Pt behavior is normal. Thought content normal.     Assessment & Plan:

## 2012-11-28 NOTE — Assessment & Plan Note (Addendum)
Mild to mod, for antibx course,  to f/u any worsening symptoms or concerns  Note:  Total time for pt hx, exam, review of record with pt in the room, determination of diagnoses and plan for further eval and tx is > 40 min, with over 50% spent in coordination and counseling of patient 

## 2012-11-28 NOTE — Assessment & Plan Note (Signed)
stable overall by history and exam, recent data reviewed with pt, and pt to continue medical treatment as before,  to f/u any worsening symptoms or concerns Lab Results  Component Value Date   LDLCALC 59 07/18/2012

## 2012-11-28 NOTE — Assessment & Plan Note (Signed)
With pain, effusion, essentially unable to stand now, for ortho evaluation

## 2012-11-28 NOTE — Assessment & Plan Note (Signed)
Lab Results  Component Value Date   HGBA1C 6.1 01/11/2012   stable overall by history and exam, recent data reviewed with pt, and pt to continue medical treatment as before,  to f/u any worsening symptoms or concerns

## 2012-11-28 NOTE — Assessment & Plan Note (Signed)
stable overall by history and exam, recent data reviewed with pt, and pt to continue medical treatment as before,  to f/u any worsening symptoms or concerns BP Readings from Last 3 Encounters:  11/28/12 152/90  07/13/12 140/80  03/23/12 150/70

## 2012-11-28 NOTE — Patient Instructions (Addendum)
Please take all new medication as prescribed - the antibiotic Please continue all other medications as before, and refills have been done if requested. Please have the pharmacy call with any other refills you may need. You will be contacted regarding the referral for: orthopedic ([facility] on [address]) We will need to consider Wound clinic if the sore does not heal in the next wk Please use dry dressings changed daily.   Please keep legs elevated if you are sitting to help with the swelling Please keep your appointments with your specialists as you have planned - Dr Antoine Poche in September 2014 Please return in 1 month for your yearly visit, or sooner if needed, with Lab testing done 3-5 days before

## 2012-11-28 NOTE — Assessment & Plan Note (Signed)
Has known right heart failure, renal insufficiency and now worsening knee effusions, to continue meds for now

## 2012-12-02 ENCOUNTER — Other Ambulatory Visit: Payer: Self-pay | Admitting: Internal Medicine

## 2012-12-12 ENCOUNTER — Ambulatory Visit (INDEPENDENT_AMBULATORY_CARE_PROVIDER_SITE_OTHER): Payer: Medicare Other | Admitting: General Practice

## 2012-12-12 DIAGNOSIS — I4891 Unspecified atrial fibrillation: Secondary | ICD-10-CM

## 2012-12-12 DIAGNOSIS — I2789 Other specified pulmonary heart diseases: Secondary | ICD-10-CM

## 2012-12-12 LAB — POCT INR: INR: 1.9

## 2012-12-19 ENCOUNTER — Encounter (HOSPITAL_COMMUNITY): Payer: Self-pay | Admitting: Emergency Medicine

## 2012-12-19 ENCOUNTER — Emergency Department (HOSPITAL_COMMUNITY)
Admission: EM | Admit: 2012-12-19 | Discharge: 2012-12-19 | Disposition: A | Discharge status: Home / Self Care | Payer: Medicare Other | Attending: Emergency Medicine | Admitting: Emergency Medicine

## 2012-12-19 ENCOUNTER — Emergency Department (HOSPITAL_COMMUNITY): Payer: Medicare Other

## 2012-12-19 DIAGNOSIS — E119 Type 2 diabetes mellitus without complications: Secondary | ICD-10-CM | POA: Insufficient documentation

## 2012-12-19 DIAGNOSIS — Z79899 Other long term (current) drug therapy: Secondary | ICD-10-CM | POA: Insufficient documentation

## 2012-12-19 DIAGNOSIS — N289 Disorder of kidney and ureter, unspecified: Secondary | ICD-10-CM | POA: Insufficient documentation

## 2012-12-19 DIAGNOSIS — Z87891 Personal history of nicotine dependence: Secondary | ICD-10-CM | POA: Insufficient documentation

## 2012-12-19 DIAGNOSIS — E039 Hypothyroidism, unspecified: Secondary | ICD-10-CM | POA: Insufficient documentation

## 2012-12-19 DIAGNOSIS — S0285XA Fracture of orbit, unspecified, initial encounter for closed fracture: Secondary | ICD-10-CM

## 2012-12-19 DIAGNOSIS — I129 Hypertensive chronic kidney disease with stage 1 through stage 4 chronic kidney disease, or unspecified chronic kidney disease: Secondary | ICD-10-CM | POA: Insufficient documentation

## 2012-12-19 DIAGNOSIS — Z8739 Personal history of other diseases of the musculoskeletal system and connective tissue: Secondary | ICD-10-CM | POA: Insufficient documentation

## 2012-12-19 DIAGNOSIS — E785 Hyperlipidemia, unspecified: Secondary | ICD-10-CM | POA: Insufficient documentation

## 2012-12-19 DIAGNOSIS — F411 Generalized anxiety disorder: Secondary | ICD-10-CM | POA: Insufficient documentation

## 2012-12-19 DIAGNOSIS — Z7901 Long term (current) use of anticoagulants: Secondary | ICD-10-CM | POA: Insufficient documentation

## 2012-12-19 DIAGNOSIS — Y92009 Unspecified place in unspecified non-institutional (private) residence as the place of occurrence of the external cause: Secondary | ICD-10-CM | POA: Insufficient documentation

## 2012-12-19 DIAGNOSIS — Z860100 Personal history of colon polyps, unspecified: Secondary | ICD-10-CM | POA: Insufficient documentation

## 2012-12-19 DIAGNOSIS — Y9301 Activity, walking, marching and hiking: Secondary | ICD-10-CM | POA: Insufficient documentation

## 2012-12-19 DIAGNOSIS — Z794 Long term (current) use of insulin: Secondary | ICD-10-CM | POA: Insufficient documentation

## 2012-12-19 DIAGNOSIS — Z8742 Personal history of other diseases of the female genital tract: Secondary | ICD-10-CM | POA: Insufficient documentation

## 2012-12-19 DIAGNOSIS — F329 Major depressive disorder, single episode, unspecified: Secondary | ICD-10-CM | POA: Insufficient documentation

## 2012-12-19 DIAGNOSIS — S0280XA Fracture of other specified skull and facial bones, unspecified side, initial encounter for closed fracture: Secondary | ICD-10-CM | POA: Insufficient documentation

## 2012-12-19 DIAGNOSIS — Z862 Personal history of diseases of the blood and blood-forming organs and certain disorders involving the immune mechanism: Secondary | ICD-10-CM | POA: Insufficient documentation

## 2012-12-19 DIAGNOSIS — E1129 Type 2 diabetes mellitus with other diabetic kidney complication: Secondary | ICD-10-CM | POA: Insufficient documentation

## 2012-12-19 DIAGNOSIS — F3289 Other specified depressive episodes: Secondary | ICD-10-CM | POA: Insufficient documentation

## 2012-12-19 DIAGNOSIS — Z8711 Personal history of peptic ulcer disease: Secondary | ICD-10-CM | POA: Insufficient documentation

## 2012-12-19 DIAGNOSIS — W010XXA Fall on same level from slipping, tripping and stumbling without subsequent striking against object, initial encounter: Secondary | ICD-10-CM | POA: Insufficient documentation

## 2012-12-19 DIAGNOSIS — Z23 Encounter for immunization: Secondary | ICD-10-CM | POA: Insufficient documentation

## 2012-12-19 DIAGNOSIS — S1093XA Contusion of unspecified part of neck, initial encounter: Secondary | ICD-10-CM | POA: Insufficient documentation

## 2012-12-19 DIAGNOSIS — I4891 Unspecified atrial fibrillation: Secondary | ICD-10-CM | POA: Insufficient documentation

## 2012-12-19 DIAGNOSIS — S0003XA Contusion of scalp, initial encounter: Secondary | ICD-10-CM | POA: Insufficient documentation

## 2012-12-19 DIAGNOSIS — T148XXA Other injury of unspecified body region, initial encounter: Secondary | ICD-10-CM

## 2012-12-19 DIAGNOSIS — Z8719 Personal history of other diseases of the digestive system: Secondary | ICD-10-CM | POA: Insufficient documentation

## 2012-12-19 DIAGNOSIS — E669 Obesity, unspecified: Secondary | ICD-10-CM | POA: Insufficient documentation

## 2012-12-19 DIAGNOSIS — I2789 Other specified pulmonary heart diseases: Secondary | ICD-10-CM | POA: Insufficient documentation

## 2012-12-19 LAB — CBC WITH DIFFERENTIAL/PLATELET
Basophils Absolute: 0 10*3/uL (ref 0.0–0.1)
Basophils Relative: 0 % (ref 0–1)
Eosinophils Relative: 1 % (ref 0–5)
HCT: 30.5 % — ABNORMAL LOW (ref 36.0–46.0)
Lymphocytes Relative: 12 % (ref 12–46)
MCHC: 32.8 g/dL (ref 30.0–36.0)
MCV: 90.2 fL (ref 78.0–100.0)
Monocytes Absolute: 0.3 10*3/uL (ref 0.1–1.0)
RDW: 14.4 % (ref 11.5–15.5)

## 2012-12-19 LAB — PROTIME-INR
INR: 2.41 — ABNORMAL HIGH (ref 0.00–1.49)
Prothrombin Time: 25.1 seconds — ABNORMAL HIGH (ref 11.6–15.2)

## 2012-12-19 LAB — COMPREHENSIVE METABOLIC PANEL
AST: 25 U/L (ref 0–37)
CO2: 26 mEq/L (ref 19–32)
Calcium: 9.3 mg/dL (ref 8.4–10.5)
Creatinine, Ser: 1.35 mg/dL — ABNORMAL HIGH (ref 0.50–1.10)
GFR calc non Af Amer: 36 mL/min — ABNORMAL LOW (ref >90)

## 2012-12-19 MED ORDER — TETANUS-DIPHTHERIA TOXOIDS TD 5-2 LFU IM INJ
0.5000 mL | INJECTION | Freq: Once | INTRAMUSCULAR | Status: AC
  Administered 2012-12-19: 0.5 mL via INTRAMUSCULAR
  Filled 2012-12-19: qty 0.5

## 2012-12-19 MED ORDER — AMOXICILLIN 500 MG PO CAPS: 500.0000 mg | ORAL_CAPSULE | Freq: Three times a day (TID) | ORAL | Status: DC

## 2012-12-19 NOTE — ED Notes (Signed)
PT. LOST BALANCED AND FELL AT HOME , HIT HER FACFE AGAINST THE DOOR , NO LOC , PRESENTS WITH LOWER LIP SWELLING /  RIGHT PERIORBITAL SWELLING WITH BRUISE , ALERT AND ORIENTED , DENIES PAIN AT THIS TIME / RESPIRATIONS UNLABORED , UPDATED DR. Fonnie Jarvis GAVE VERBAL ORDER FOR CT HEAD AND MAXILOFACIAL  CT.

## 2012-12-19 NOTE — ED Notes (Signed)
D/c instructions reviewed w/ pt and family - pt and family deny any further questions or concerns at present. Pt assisted to d/c via wheelchair, pt in no acute distress, A&Ox4. Rx given x1

## 2012-12-19 NOTE — ED Provider Notes (Signed)
History    CSN: [id number] Arrival date & time 12/19/12  5638  First MD Initiated Contact with Patient 12/19/12 2300     Chief Complaint  Patient presents with  . Fall   (Consider location/radiation/quality/duration/timing/severity/associated sxs/prior Treatment) HPI This patient is a very pleasant elderly woman who ambulates with the assistance of her family. She walked from one room to another in her house without assistance, lost her balance and fell face forward. She hit her face against a door. She denies loss of consciousness. She reports injury to her) oral region and lower lip. She has mild right periorbital pain at this time. She denies any visual disturbances.  Of note, the patient is anticoagulated with Coumadin for a history of atrial fibrillation.  Past Medical History  Diagnosis Date  . Obesity   . Anemia, iron deficiency   . Depression   . Diabetes mellitus type II   . Hyperlipidemia   . Hypertension   . Peptic ulcer   . History of uterine fibroid   . Hx of colonic polyps   . Diverticula, colon   . Osteoarthritis   . Low back pain   . Atrial fibrillation     permanent, on coumadin (followed by [facility]madin clinic)  . Pulmonary hypertension     echo 4/12:  EF 55-60%, mod LVH, mild BAE, RVE and PASP 51  . Anxiety   . DIABETES MELLITUS, TYPE II 03/05/2007  . HYPERLIPIDEMIA 03/05/2007  . ANEMIA-IRON DEFICIENCY 03/05/2007  . HYPERTENSION 03/05/2007  . DEPRESSION 03/05/2007  . LOW BACK PAIN 03/05/2007  . PERIPHERAL EDEMA 04/29/2008  . Chronic pain syndrome 06/23/2010  . FIBROIDS, UTERUS 03/05/2007  . PEPTIC ULCER DISEASE 03/05/2007  . DIVERTICULOSIS, COLON 03/05/2007  . OSTEOARTHRITIS 03/05/2007  . COLONIC POLYPS, HX OF 03/05/2007  . Hypothyroidism 10/15/2010  . Renal insufficiency 10/15/2010   Past Surgical History  Procedure Laterality Date  . Laparoscopic hysterectomy     Family History  Problem Relation Age of Onset  . Hypertension Sister    History  Substance Use  Topics  . Smoking status: Former Smoker    Start date: 03/23/1972  . Smokeless tobacco: Not on file  . Alcohol Use: No   OB History   Grav Para Term Preterm Abortions TAB SAB Ect Mult Living                 Review of Systems Gen: no weight loss, fevers, chills, night sweats Eyes: As per history of present illness, otherwise negative Nose: no epistaxis or rhinorrhea Mouth: no dental pain, no sore throat, lower lip swollen, Neck: no neck pain, Lungs: no SOB, cough, wheezing CV: no chest pain, palpitations, dependent edema or orthopnea Abd: no abdominal pain, nausea, vomiting GU: no dysuria or gross hematuria MSK: no myalgias or arthralgias Neuro: no headache, no focal neurologic deficits Skin: no rash Psyche: negative.  Allergies  Clonidine derivatives  Home Medications   Current Outpatient Rx  Name  Route  Sig  Dispense  Refill  . ALPRAZolam (XANAX) 0.25 MG tablet   Oral   Take 0.25 mg by mouth 2 (two) times daily as needed. For anxiety         . amoxicillin (AMOXIL) 500 MG capsule   Oral   Take 1 capsule (500 mg total) by mouth 3 (three) times daily.   21 capsule   0   . cephALEXin (KEFLEX) 500 MG capsule   Oral   Take 1 capsule (500 mg total) by mouth  4 (four) times daily.   40 capsule   0   . Cholecalciferol (VITAMIN D) 1000 UNITS capsule   Oral   Take 1,000 Units by mouth daily.          . citalopram (CELEXA) 20 MG tablet      TAKE 1 TABLET EVERY DAY   30 tablet   6   . EXPIRED: diltiazem (CARDIZEM CD) 120 MG 24 hr capsule   Oral   Take 120 mg by mouth daily.          Marland Kitchen diltiazem (CARDIZEM CD) 120 MG 24 hr capsule      TAKE ONE CAPSULE BY MOUTH EVERY DAY   30 capsule   7   . docusate sodium (COLACE) 100 MG capsule   Oral   Take 100 mg by mouth 2 (two) times daily as needed. For constipation          . furosemide (LASIX) 40 MG tablet   Oral   Take 60 mg by mouth daily.          . hydrALAZINE (APRESOLINE) 50 MG tablet      TAKE  1 TABLET BY MOUTH 3 TIMES A DAY   270 tablet   3   . HYDROcodone-acetaminophen (NORCO/VICODIN) 5-325 MG per tablet      TAKE 1 TABLET BY MOUTH EVERY 8 HOURS AS NEEDED FOR PAIN   90 tablet   2   . insulin aspart (NOVOLOG) 100 UNIT/ML injection   Subcutaneous   Inject 5 Units into the skin 3 (three) times daily before meals. If CBG is greater than or equal to 150, give 5 units          . labetalol (NORMODYNE) 300 MG tablet      TAKE 1 TABLET BY MOUTH TWICE A DAY   60 tablet   6   . levothyroxine (SYNTHROID, LEVOTHROID) 25 MCG tablet      TAKE 1 TABLET BY MOUTH DAILY   30 tablet   10   . lidocaine (LIDODERM) 5 %   Transdermal   Place 1 patch onto the skin daily. Remove & Discard patch within 12 hours or as directed by MD          . EXPIRED: lovastatin (MEVACOR) 40 MG tablet                . lovastatin (MEVACOR) 40 MG tablet      TAKE 2 TABLETS BY MOUTH EVERY DAY   60 tablet   11   . metFORMIN (GLUCOPHAGE) 500 MG tablet      TAKE 1 TABLET BY MOUTH EVERY DAY   90 tablet   3   . senna (SENOKOT) 8.6 MG TABS   Oral   Take 2 tablets by mouth daily as needed. For constipation          . sorbitol 70 % solution   Oral   Take 25 mLs by mouth daily as needed. For constipation          . warfarin (COUMADIN) 5 MG tablet   Oral   Take 5 mg by mouth daily.           Marland Kitchen warfarin (COUMADIN) 5 MG tablet      USE AS DIRECTED   50 tablet   5    BP 154/70  Pulse 82  Temp(Src) 98.3 F (36.8 C) (Oral)  Resp 18  SpO2 96% Physical Exam Gen: well developed and well nourished appearing, alert  and oriented x4 Head: NCAT Eyes: PERL, EOMI, right. Orbital soft tissue swelling and ecchymoses, tenderness to palpation over the right medial orbit, the eye appears atraumatic. Extraocular movements are intact and painless. Left eye appears normal to inspection Nose: no epistaixis or rhinorrhea Mouth/throat: mucosa is moist and pink, no intraoral trauma identified, no  tongue laceration, no dental trauma identified, there is some soft tissue swelling of the lower lip with a small superficial laceration to the mucosal surface Neck: supple, no stridor, no C-spine tenderness Lungs: CTA B, no wheezing, rhonchi or rales Cardiovascular-regular rate and rhythm, no murmur Abd: soft, notender, nondistended Back: Marked kyphosis, no midline tenderness no ttp, no cva ttp Skin: no rashese, wnl Neuro: CN ii-xii grossly intact, no focal deficits, GCS 15 Psyche; normal affect,  calm and cooperative.   ED Course  Procedures (including critical care time) Labs Reviewed  CBC WITH DIFFERENTIAL - Abnormal; Notable for the following:    RBC 3.38 (*)    Hemoglobin 10.0 (*)    HCT 30.5 (*)    Neutrophils Relative % 81 (*)    Lymphs Abs 0.6 (*)    All other components within normal limits  COMPREHENSIVE METABOLIC PANEL - Abnormal; Notable for the following:    Glucose, Bld 117 (*)    BUN 27 (*)    Creatinine, Ser 1.35 (*)    Albumin 3.3 (*)    GFR calc non Af Amer 36 (*)    GFR calc Af Amer 42 (*)    All other components within normal limits  PROTIME-INR - Abnormal; Notable for the following:    Prothrombin Time 25.1 (*)    INR 2.41 (*)    All other components within normal limits   Ct Head Wo Contrast  12/19/2012   *RADIOLOGY REPORT*  Clinical Data:  Fall  CT HEAD WITHOUT CONTRAST CT MAXILLOFACIAL WITHOUT CONTRAST  Technique:  Multidetector CT imaging of the head and maxillofacial structures were performed using the standard protocol without intravenous contrast. Multiplanar CT image reconstructions of the maxillofacial structures were also generated.  Comparison:   None.  CT HEAD  Findings: Generalized atrophy.  Chronic microvascular ischemic changes in the white matter and basal ganglia.  No acute infarct. Negative for hemorrhage or mass.  No subdural hemorrhage is present.  Negative for skull fracture.  Image quality degraded by patient motion.  Right frontal scalp  hematoma.  IMPRESSION: No acute intracranial abnormality.  CT MAXILLOFACIAL  Findings:   Images degraded by motion.  Fracture of the right medial orbit with orbital fat herniating into the right ethmoid sinus.  No associated fluid or edema in this may be a chronic fracture.  No fracture of the mandible or orbit is identified.  Nasal bone is normal.  Paranasal sinuses are clear.  IMPRESSION:  Fracture of the right medial orbit, possibly chronic.   Original Report Authenticated By: Janeece Riggers, M.D.   Ct Maxillofacial Wo Cm  12/19/2012   *RADIOLOGY REPORT*  Clinical Data:  Fall  CT HEAD WITHOUT CONTRAST CT MAXILLOFACIAL WITHOUT CONTRAST  Technique:  Multidetector CT imaging of the head and maxillofacial structures were performed using the standard protocol without intravenous contrast. Multiplanar CT image reconstructions of the maxillofacial structures were also generated.  Comparison:   None.  CT HEAD  Findings: Generalized atrophy.  Chronic microvascular ischemic changes in the white matter and basal ganglia.  No acute infarct. Negative for hemorrhage or mass.  No subdural hemorrhage is present.  Negative for skull fracture.  Image quality degraded by patient motion.  Right frontal scalp hematoma.  IMPRESSION: No acute intracranial abnormality.  CT MAXILLOFACIAL  Findings:   Images degraded by motion.  Fracture of the right medial orbit with orbital fat herniating into the right ethmoid sinus.  No associated fluid or edema in this may be a chronic fracture.  No fracture of the mandible or orbit is identified.  Nasal bone is normal.  Paranasal sinuses are clear.  IMPRESSION:  Fracture of the right medial orbit, possibly chronic.   Original Report Authenticated By: Janeece Riggers, M.D.   1. Orbital fracture, closed, initial encounter   2. Contusion     MDM  Patient and her niece informed of fracture of the right medial orbit. We are treating empirically with amoxicillin. Cool packs for swelling and discomfort.  The patient will followup with Dr. Charlotte Sanes of ophthalmology as well as her primary care physician. Her tetanus has been updated in the emergency department.  Brandt Loosen, MD 12/19/12 2352

## 2012-12-19 NOTE — ED Notes (Signed)
Pt reports she was using the rest room and "has bad knees" - pt was attempting to get up and leave the bathroom, pt then fell hitting her head on the bathroom door. Pt w/ contusion and swelling to rt eye. Pt is A&Ox4, in no acute distress.

## 2012-12-25 ENCOUNTER — Telehealth: Payer: Self-pay | Admitting: Internal Medicine

## 2012-12-25 ENCOUNTER — Encounter: Payer: Self-pay | Admitting: Internal Medicine

## 2012-12-25 DIAGNOSIS — L97521 Non-pressure chronic ulcer of other part of left foot limited to breakdown of skin: Secondary | ICD-10-CM

## 2012-12-25 NOTE — Telephone Encounter (Signed)
Pt's niece, Linda,called requesting referral for wound clinic for [name]. Bonita Quin stated that Dr. Jonny Ruiz mention that [name] need to go to wound clinic if pt's heel is not better. Please advise.

## 2012-12-25 NOTE — Telephone Encounter (Signed)
Referral done

## 2012-12-25 NOTE — Telephone Encounter (Signed)
Patient informed. 

## 2013-01-10 ENCOUNTER — Encounter: Payer: Self-pay | Admitting: Internal Medicine

## 2013-01-10 ENCOUNTER — Ambulatory Visit (INDEPENDENT_AMBULATORY_CARE_PROVIDER_SITE_OTHER): Payer: Medicare Other | Admitting: General Practice

## 2013-01-10 ENCOUNTER — Ambulatory Visit (INDEPENDENT_AMBULATORY_CARE_PROVIDER_SITE_OTHER): Payer: Medicare Other | Admitting: Internal Medicine

## 2013-01-10 VITALS — BP 122/80 | HR 102 | Temp 97.5°F

## 2013-01-10 DIAGNOSIS — L02419 Cutaneous abscess of limb, unspecified: Secondary | ICD-10-CM

## 2013-01-10 DIAGNOSIS — I4891 Unspecified atrial fibrillation: Secondary | ICD-10-CM

## 2013-01-10 DIAGNOSIS — M25562 Pain in left knee: Secondary | ICD-10-CM

## 2013-01-10 DIAGNOSIS — I2789 Other specified pulmonary heart diseases: Secondary | ICD-10-CM

## 2013-01-10 DIAGNOSIS — R296 Repeated falls: Secondary | ICD-10-CM

## 2013-01-10 DIAGNOSIS — Z9181 History of falling: Secondary | ICD-10-CM

## 2013-01-10 DIAGNOSIS — M25569 Pain in unspecified knee: Secondary | ICD-10-CM

## 2013-01-10 DIAGNOSIS — L03116 Cellulitis of left lower limb: Secondary | ICD-10-CM

## 2013-01-10 LAB — POCT INR: INR: 2

## 2013-01-10 MED ORDER — FUROSEMIDE 40 MG PO TABS: 40.0000 mg | ORAL_TABLET | Freq: Every day | ORAL | Status: DC

## 2013-01-10 MED ORDER — CEPHALEXIN 500 MG PO CAPS: 500.0000 mg | ORAL_CAPSULE | Freq: Four times a day (QID) | ORAL | Status: DC

## 2013-01-10 MED ORDER — HYDROCODONE-ACETAMINOPHEN 5-325 MG PO TABS: ORAL_TABLET | ORAL | Status: DC

## 2013-01-10 NOTE — Assessment & Plan Note (Signed)
Left > right small areas prob cellultiis contigous with ulcerations - for keflex asd,  to f/u any worsening symptoms or concerns

## 2013-01-10 NOTE — Addendum Note (Signed)
Addended by: Corwin Levins on: 01/10/2013 10:23 PM   Modules accepted: Level of Service

## 2013-01-10 NOTE — Assessment & Plan Note (Signed)
Right > left with effusion, high risk fall, declines ortho referral even for cortisone

## 2013-01-10 NOTE — Progress Notes (Signed)
Subjective:    Patient ID: [name], female    DOB: Sep 11, 1931, 78 y.o.   MRN: [id number]  HPI  Here with helper/caretaker, has 1 mo worsening bilat LE venous statis ulcerations with appt for wound clinic next mon, but unfortunately with mild red/tender areas left > right, no fever, red streaks or drainage.  Has ongoing right knee pain worse x 1 mo with persistent effusion, c/w her known end stage DJD, declined surgury previously, pain recurrent mod to severe, high risk fall, weaker recently, willing to re-try home PT.  Has fallen x 3 in past 6 wks, c/o dizziness prior and signficant diuretic effect with the lasix.  On chronic coumadin, with recent orbital fx on CT with fall to face.   Past Medical History  Diagnosis Date  . Obesity   . Anemia, iron deficiency   . Depression   . Diabetes mellitus type II   . Hyperlipidemia   . Hypertension   . Peptic ulcer   . History of uterine fibroid   . Hx of colonic polyps   . Diverticula, colon   . Osteoarthritis   . Low back pain   . Atrial fibrillation     permanent, on coumadin (followed by [facility] coumadin clinic)  . Pulmonary hypertension     echo 4/12:  EF 55-60%, mod LVH, mild BAE, RVE and PASP 51  . Anxiety   . DIABETES MELLITUS, TYPE II 03/05/2007  . HYPERLIPIDEMIA 03/05/2007  . ANEMIA-IRON DEFICIENCY 03/05/2007  . HYPERTENSION 03/05/2007  . DEPRESSION 03/05/2007  . LOW BACK PAIN 03/05/2007  . PERIPHERAL EDEMA 04/29/2008  . Chronic pain syndrome 06/23/2010  . FIBROIDS, UTERUS 03/05/2007  . PEPTIC ULCER DISEASE 03/05/2007  . DIVERTICULOSIS, COLON 03/05/2007  . OSTEOARTHRITIS 03/05/2007  . COLONIC POLYPS, HX OF 03/05/2007  . Hypothyroidism 10/15/2010  . Renal insufficiency 10/15/2010   Past Surgical History  Procedure Laterality Date  . Laparoscopic hysterectomy      reports that she has quit smoking. She started smoking about 40 years ago. She does not have any smokeless tobacco history on file. She reports that she does not drink alcohol or use  illicit drugs. family history includes Hypertension in her sister. Allergies  Allergen Reactions  . Clonidine Derivatives Rash    Skin breaks out   Current Outpatient Prescriptions on File Prior to Visit  Medication Sig Dispense Refill  . ALPRAZolam (XANAX) 0.25 MG tablet Take 0.25 mg by mouth 2 (two) times daily as needed. For anxiety      . Cholecalciferol (VITAMIN D) 1000 UNITS capsule Take 1,000 Units by mouth daily.       . citalopram (CELEXA) 20 MG tablet TAKE 1 TABLET EVERY DAY  30 tablet  6  . diltiazem (CARDIZEM CD) 120 MG 24 hr capsule TAKE ONE CAPSULE BY MOUTH EVERY DAY  30 capsule  7  . docusate sodium (COLACE) 100 MG capsule Take 100 mg by mouth 2 (two) times daily as needed. For constipation       . hydrALAZINE (APRESOLINE) 50 MG tablet TAKE 1 TABLET BY MOUTH 3 TIMES A DAY  270 tablet  3  . insulin aspart (NOVOLOG) 100 UNIT/ML injection Inject 5 Units into the skin 3 (three) times daily before meals. If CBG is greater than or equal to 150, give 5 units       . labetalol (NORMODYNE) 300 MG tablet TAKE 1 TABLET BY MOUTH TWICE A DAY  60 tablet  6  . levothyroxine (SYNTHROID, LEVOTHROID) 25  MCG tablet TAKE 1 TABLET BY MOUTH DAILY  30 tablet  10  . lidocaine (LIDODERM) 5 % Place 1 patch onto the skin daily. Remove & Discard patch within 12 hours or as directed by MD       . lovastatin (MEVACOR) 40 MG tablet TAKE 2 TABLETS BY MOUTH EVERY DAY  60 tablet  11  . metFORMIN (GLUCOPHAGE) 500 MG tablet TAKE 1 TABLET BY MOUTH EVERY DAY  90 tablet  3  . senna (SENOKOT) 8.6 MG TABS Take 2 tablets by mouth daily as needed. For constipation       . sorbitol 70 % solution Take 25 mLs by mouth daily as needed. For constipation       . warfarin (COUMADIN) 5 MG tablet Take 5 mg by mouth daily.        Marland Kitchen warfarin (COUMADIN) 5 MG tablet USE AS DIRECTED  50 tablet  5  . diltiazem (CARDIZEM CD) 120 MG 24 hr capsule Take 120 mg by mouth daily.       Marland Kitchen lovastatin (MEVACOR) 40 MG tablet         No  current facility-administered medications on file prior to visit.   Review of Systems  Constitutional: Negative for unexpected weight change, or unusual diaphoresis  HENT: Negative for tinnitus.   Eyes: Negative for photophobia and visual disturbance.  Respiratory: Negative for choking and stridor.   Gastrointestinal: Negative for vomiting and blood in stool.  Genitourinary: Negative for hematuria and decreased urine volume.  Musculoskeletal: Negative for acute joint swelling Skin: Negative for color change and wound.  Neurological: Negative for tremors and numbness other than noted  Psychiatric/Behavioral: Negative for decreased concentration or  hyperactivity.       Objective:   Physical Exam BP 122/80  Pulse 102  Temp(Src) 97.5 F (36.4 C) (Oral)  SpO2 97% VS noted, mild ill Constitutional: Pt appears well-developed and well-nourished.but thinner than previous, general weakness/fatigue  HENT: Head: NCAT.  Right Ear: External ear normal.  Left Ear: External ear normal.  Eyes: Conjunctivae and EOM are normal. Pupils are equal, round, and reactive to light.  Neck: Normal range of motion. Neck supple.  Cardiovascular: Normal rate and regular rhythm.   Pulmonary/Chest: Effort normal and breath sounds - no rales or wheezing.  Abd:  Soft, NT, non-distended, + BS Neurological: Pt is alert. Not confused , moves all 4's, not done in detail Skin: with two to three bilat 1x2 cm superficial weepy clearish fluid venous stasis type mid leg ulcerations ; with bilat mild areas mid leg red/tender/swelling Psychiatric: Pt behavior is normal. Thought content normal.  Right knee with 1-2+ effusion, NT, decreased ROM    Assessment & Plan:

## 2013-01-10 NOTE — Patient Instructions (Signed)
Please take all new medication as prescribed  - the antibiotic Please keep your appointments with your specialists as you have planned  - wound clinic next Monday OK to decrease the lasix to 40 mg per day Your pain medication was refilled You will be contacted regarding the referral for: Home Physical Therapy Please consider seeing your orthopedic as well about the right knee Remember, we may have to stop the coumadin if you have any further falls  Please remember to sign up for My Chart if you have not done so, as this will be important to you in the future with finding out test results, communicating by private email, and scheduling acute appointments online when needed.  Please return in 1 months, or sooner if needed

## 2013-01-10 NOTE — Assessment & Plan Note (Addendum)
For home PT as above, tx cellulitis, pain improvement/pain med refill, as well as decreased lasix to 40 qd,  but with several recent falls and recent orbital fx would consider stop coumadin for any further falls  Note:  Total time for pt hx, exam, review of record with pt in the room, determination of diagnoses and plan for further eval and tx is > 40 min, with over 50% spent in coordination and counseling of patient

## 2013-01-15 ENCOUNTER — Encounter (HOSPITAL_BASED_OUTPATIENT_CLINIC_OR_DEPARTMENT_OTHER): Payer: Medicare Other | Attending: General Surgery

## 2013-01-15 DIAGNOSIS — I87319 Chronic venous hypertension (idiopathic) with ulcer of unspecified lower extremity: Secondary | ICD-10-CM | POA: Diagnosis not present

## 2013-01-15 DIAGNOSIS — E119 Type 2 diabetes mellitus without complications: Secondary | ICD-10-CM | POA: Diagnosis not present

## 2013-01-15 DIAGNOSIS — L97809 Non-pressure chronic ulcer of other part of unspecified lower leg with unspecified severity: Secondary | ICD-10-CM | POA: Insufficient documentation

## 2013-01-15 DIAGNOSIS — I4891 Unspecified atrial fibrillation: Secondary | ICD-10-CM | POA: Insufficient documentation

## 2013-01-15 DIAGNOSIS — Z79899 Other long term (current) drug therapy: Secondary | ICD-10-CM | POA: Insufficient documentation

## 2013-01-15 DIAGNOSIS — L97909 Non-pressure chronic ulcer of unspecified part of unspecified lower leg with unspecified severity: Secondary | ICD-10-CM | POA: Insufficient documentation

## 2013-01-15 DIAGNOSIS — Z7901 Long term (current) use of anticoagulants: Secondary | ICD-10-CM | POA: Insufficient documentation

## 2013-01-19 DIAGNOSIS — L97809 Non-pressure chronic ulcer of other part of unspecified lower leg with unspecified severity: Secondary | ICD-10-CM | POA: Diagnosis not present

## 2013-01-19 DIAGNOSIS — I87319 Chronic venous hypertension (idiopathic) with ulcer of unspecified lower extremity: Secondary | ICD-10-CM | POA: Diagnosis not present

## 2013-01-19 DIAGNOSIS — I4891 Unspecified atrial fibrillation: Secondary | ICD-10-CM | POA: Diagnosis not present

## 2013-01-19 DIAGNOSIS — E119 Type 2 diabetes mellitus without complications: Secondary | ICD-10-CM | POA: Diagnosis not present

## 2013-01-19 DIAGNOSIS — L97909 Non-pressure chronic ulcer of unspecified part of unspecified lower leg with unspecified severity: Secondary | ICD-10-CM | POA: Diagnosis not present

## 2013-01-26 ENCOUNTER — Encounter (HOSPITAL_COMMUNITY): Payer: Self-pay | Admitting: Emergency Medicine

## 2013-01-26 ENCOUNTER — Emergency Department (HOSPITAL_COMMUNITY): Payer: Medicare Other

## 2013-01-26 ENCOUNTER — Inpatient Hospital Stay (HOSPITAL_COMMUNITY)
Admission: EM | Admit: 2013-01-26 | Discharge: 2013-02-01 | DRG: 602 | Disposition: A | Discharge status: Home Health | Payer: Medicare Other | Attending: Internal Medicine | Admitting: Internal Medicine

## 2013-01-26 ENCOUNTER — Encounter (HOSPITAL_BASED_OUTPATIENT_CLINIC_OR_DEPARTMENT_OTHER): Payer: Medicare Other | Attending: General Surgery

## 2013-01-26 DIAGNOSIS — Z87891 Personal history of nicotine dependence: Secondary | ICD-10-CM

## 2013-01-26 DIAGNOSIS — L97209 Non-pressure chronic ulcer of unspecified calf with unspecified severity: Secondary | ICD-10-CM | POA: Diagnosis present

## 2013-01-26 DIAGNOSIS — F329 Major depressive disorder, single episode, unspecified: Secondary | ICD-10-CM | POA: Diagnosis present

## 2013-01-26 DIAGNOSIS — E039 Hypothyroidism, unspecified: Secondary | ICD-10-CM | POA: Diagnosis present

## 2013-01-26 DIAGNOSIS — Z794 Long term (current) use of insulin: Secondary | ICD-10-CM

## 2013-01-26 DIAGNOSIS — I5033 Acute on chronic diastolic (congestive) heart failure: Secondary | ICD-10-CM | POA: Diagnosis present

## 2013-01-26 DIAGNOSIS — Z7901 Long term (current) use of anticoagulants: Secondary | ICD-10-CM

## 2013-01-26 DIAGNOSIS — L02419 Cutaneous abscess of limb, unspecified: Principal | ICD-10-CM | POA: Diagnosis present

## 2013-01-26 DIAGNOSIS — M1990 Unspecified osteoarthritis, unspecified site: Secondary | ICD-10-CM | POA: Diagnosis present

## 2013-01-26 DIAGNOSIS — L03116 Cellulitis of left lower limb: Secondary | ICD-10-CM | POA: Diagnosis present

## 2013-01-26 DIAGNOSIS — I10 Essential (primary) hypertension: Secondary | ICD-10-CM | POA: Diagnosis present

## 2013-01-26 DIAGNOSIS — L03119 Cellulitis of unspecified part of limb: Principal | ICD-10-CM | POA: Diagnosis present

## 2013-01-26 DIAGNOSIS — I872 Venous insufficiency (chronic) (peripheral): Secondary | ICD-10-CM | POA: Diagnosis present

## 2013-01-26 DIAGNOSIS — I4891 Unspecified atrial fibrillation: Secondary | ICD-10-CM | POA: Diagnosis present

## 2013-01-26 DIAGNOSIS — R609 Edema, unspecified: Secondary | ICD-10-CM

## 2013-01-26 DIAGNOSIS — G894 Chronic pain syndrome: Secondary | ICD-10-CM

## 2013-01-26 DIAGNOSIS — E785 Hyperlipidemia, unspecified: Secondary | ICD-10-CM | POA: Diagnosis present

## 2013-01-26 DIAGNOSIS — F3289 Other specified depressive episodes: Secondary | ICD-10-CM | POA: Diagnosis present

## 2013-01-26 DIAGNOSIS — F411 Generalized anxiety disorder: Secondary | ICD-10-CM | POA: Diagnosis present

## 2013-01-26 DIAGNOSIS — I519 Heart disease, unspecified: Secondary | ICD-10-CM

## 2013-01-26 DIAGNOSIS — E119 Type 2 diabetes mellitus without complications: Secondary | ICD-10-CM

## 2013-01-26 DIAGNOSIS — L03115 Cellulitis of right lower limb: Secondary | ICD-10-CM | POA: Diagnosis present

## 2013-01-26 DIAGNOSIS — D509 Iron deficiency anemia, unspecified: Secondary | ICD-10-CM

## 2013-01-26 DIAGNOSIS — I5189 Other ill-defined heart diseases: Secondary | ICD-10-CM

## 2013-01-26 DIAGNOSIS — N182 Chronic kidney disease, stage 2 (mild): Secondary | ICD-10-CM | POA: Diagnosis present

## 2013-01-26 DIAGNOSIS — Z66 Do not resuscitate: Secondary | ICD-10-CM | POA: Diagnosis present

## 2013-01-26 DIAGNOSIS — E669 Obesity, unspecified: Secondary | ICD-10-CM | POA: Diagnosis present

## 2013-01-26 DIAGNOSIS — L0390 Cellulitis, unspecified: Secondary | ICD-10-CM

## 2013-01-26 DIAGNOSIS — I83009 Varicose veins of unspecified lower extremity with ulcer of unspecified site: Secondary | ICD-10-CM

## 2013-01-26 DIAGNOSIS — I2789 Other specified pulmonary heart diseases: Secondary | ICD-10-CM | POA: Diagnosis present

## 2013-01-26 DIAGNOSIS — L97909 Non-pressure chronic ulcer of unspecified part of unspecified lower leg with unspecified severity: Secondary | ICD-10-CM | POA: Insufficient documentation

## 2013-01-26 DIAGNOSIS — I129 Hypertensive chronic kidney disease with stage 1 through stage 4 chronic kidney disease, or unspecified chronic kidney disease: Secondary | ICD-10-CM | POA: Diagnosis present

## 2013-01-26 DIAGNOSIS — D638 Anemia in other chronic diseases classified elsewhere: Secondary | ICD-10-CM | POA: Diagnosis present

## 2013-01-26 DIAGNOSIS — I482 Chronic atrial fibrillation, unspecified: Secondary | ICD-10-CM | POA: Diagnosis present

## 2013-01-26 LAB — CBC WITH DIFFERENTIAL/PLATELET
Basophils Absolute: 0 10*3/uL (ref 0.0–0.1)
Basophils Relative: 1 % (ref 0–1)
Eosinophils Absolute: 0.1 10*3/uL (ref 0.0–0.7)
MCH: 30.4 pg (ref 26.0–34.0)
MCHC: 33.1 g/dL (ref 30.0–36.0)
Neutrophils Relative %: 83 % — ABNORMAL HIGH (ref 43–77)
Platelets: 244 10*3/uL (ref 150–400)
RBC: 3.29 MIL/uL — ABNORMAL LOW (ref 3.87–5.11)
RDW: 15.2 % (ref 11.5–15.5)

## 2013-01-26 LAB — COMPREHENSIVE METABOLIC PANEL
ALT: 12 U/L (ref 0–35)
Albumin: 3.3 g/dL — ABNORMAL LOW (ref 3.5–5.2)
Alkaline Phosphatase: 72 U/L (ref 39–117)
Calcium: 9.8 mg/dL (ref 8.4–10.5)
Potassium: 4.2 mEq/L (ref 3.5–5.1)
Sodium: 143 mEq/L (ref 135–145)
Total Protein: 6.3 g/dL (ref 6.0–8.3)

## 2013-01-26 LAB — GLUCOSE, CAPILLARY: Glucose-Capillary: 170 mg/dL — ABNORMAL HIGH (ref 70–99)

## 2013-01-26 LAB — PROTIME-INR
INR: 1.34 (ref 0.00–1.49)
Prothrombin Time: 16.3 seconds — ABNORMAL HIGH (ref 11.6–15.2)

## 2013-01-26 MED ORDER — SIMVASTATIN 40 MG PO TABS
40.0000 mg | ORAL_TABLET | Freq: Every day | ORAL | Status: DC
  Filled 2013-01-26: qty 1

## 2013-01-26 MED ORDER — PIPERACILLIN-TAZOBACTAM 3.375 G IVPB
3.3750 g | Freq: Three times a day (TID) | INTRAVENOUS | Status: DC
  Administered 2013-01-27 – 2013-02-01 (×18): 3.375 g via INTRAVENOUS
  Filled 2013-01-26 (×21): qty 50

## 2013-01-26 MED ORDER — LABETALOL HCL 300 MG PO TABS
300.0000 mg | ORAL_TABLET | Freq: Two times a day (BID) | ORAL | Status: DC
  Administered 2013-01-27 – 2013-02-01 (×11): 300 mg via ORAL
  Filled 2013-01-26 (×12): qty 1

## 2013-01-26 MED ORDER — HYDRALAZINE HCL 50 MG PO TABS
50.0000 mg | ORAL_TABLET | Freq: Three times a day (TID) | ORAL | Status: DC
  Administered 2013-01-26 – 2013-02-01 (×18): 50 mg via ORAL
  Filled 2013-01-26 (×20): qty 1

## 2013-01-26 MED ORDER — ACETAMINOPHEN 650 MG RE SUPP: 650.0000 mg | Freq: Four times a day (QID) | RECTAL | Status: DC | PRN

## 2013-01-26 MED ORDER — FUROSEMIDE 40 MG PO TABS
40.0000 mg | ORAL_TABLET | Freq: Every day | ORAL | Status: DC
  Filled 2013-01-26: qty 1

## 2013-01-26 MED ORDER — INSULIN ASPART 100 UNIT/ML ~~LOC~~ SOLN
0.0000 [IU] | Freq: Three times a day (TID) | SUBCUTANEOUS | Status: DC
  Administered 2013-01-27: 2 [IU] via SUBCUTANEOUS
  Administered 2013-01-28: 3 [IU] via SUBCUTANEOUS
  Administered 2013-01-28 (×2): 2 [IU] via SUBCUTANEOUS
  Administered 2013-01-29: 3 [IU] via SUBCUTANEOUS
  Administered 2013-01-29 – 2013-01-30 (×3): 2 [IU] via SUBCUTANEOUS
  Administered 2013-01-30 – 2013-02-01 (×4): 3 [IU] via SUBCUTANEOUS
  Administered 2013-02-01 (×2): 2 [IU] via SUBCUTANEOUS

## 2013-01-26 MED ORDER — ENOXAPARIN SODIUM 40 MG/0.4ML ~~LOC~~ SOLN
40.0000 mg | SUBCUTANEOUS | Status: DC
  Administered 2013-01-26: 40 mg via SUBCUTANEOUS
  Filled 2013-01-26 (×2): qty 0.4

## 2013-01-26 MED ORDER — OXYCODONE HCL 5 MG PO TABS
5.0000 mg | ORAL_TABLET | ORAL | Status: DC | PRN
  Administered 2013-01-26 – 2013-01-30 (×8): 5 mg via ORAL
  Filled 2013-01-26 (×8): qty 1

## 2013-01-26 MED ORDER — LEVOTHYROXINE SODIUM 25 MCG PO TABS
25.0000 ug | ORAL_TABLET | Freq: Every day | ORAL | Status: DC
  Administered 2013-01-27 – 2013-02-01 (×6): 25 ug via ORAL
  Filled 2013-01-26 (×7): qty 1

## 2013-01-26 MED ORDER — WARFARIN SODIUM 7.5 MG PO TABS
7.5000 mg | ORAL_TABLET | Freq: Once | ORAL | Status: AC
  Administered 2013-01-26: 7.5 mg via ORAL
  Filled 2013-01-26: qty 1

## 2013-01-26 MED ORDER — PIPERACILLIN-TAZOBACTAM 3.375 G IVPB 30 MIN
3.3750 g | INTRAVENOUS | Status: AC
  Administered 2013-01-26: 3.375 g via INTRAVENOUS

## 2013-01-26 MED ORDER — HYDROCODONE-ACETAMINOPHEN 5-325 MG PO TABS
1.0000 | ORAL_TABLET | ORAL | Status: DC | PRN
  Administered 2013-01-26: 2 via ORAL
  Administered 2013-01-27: 1 via ORAL
  Administered 2013-01-27: 2 via ORAL
  Administered 2013-01-30 – 2013-02-01 (×3): 1 via ORAL
  Filled 2013-01-26 (×2): qty 2
  Filled 2013-01-26 (×4): qty 1

## 2013-01-26 MED ORDER — CITALOPRAM HYDROBROMIDE 20 MG PO TABS
20.0000 mg | ORAL_TABLET | Freq: Every day | ORAL | Status: DC
  Administered 2013-01-26 – 2013-02-01 (×7): 20 mg via ORAL
  Filled 2013-01-26 (×7): qty 1

## 2013-01-26 MED ORDER — DILTIAZEM HCL ER COATED BEADS 120 MG PO CP24
120.0000 mg | ORAL_CAPSULE | Freq: Every day | ORAL | Status: DC
  Administered 2013-01-26 – 2013-02-01 (×7): 120 mg via ORAL
  Filled 2013-01-26 (×7): qty 1

## 2013-01-26 MED ORDER — POLYETHYLENE GLYCOL 3350 17 G PO PACK
17.0000 g | PACK | Freq: Every day | ORAL | Status: DC
  Administered 2013-01-26 – 2013-01-28 (×3): 17 g via ORAL
  Filled 2013-01-26 (×8): qty 1

## 2013-01-26 MED ORDER — WARFARIN - PHARMACIST DOSING INPATIENT
Freq: Every day | Status: DC
  Administered 2013-01-30: 18:00:00

## 2013-01-26 MED ORDER — ACETAMINOPHEN 325 MG PO TABS
650.0000 mg | ORAL_TABLET | Freq: Four times a day (QID) | ORAL | Status: DC | PRN
  Administered 2013-01-29: 650 mg via ORAL
  Filled 2013-01-26: qty 2

## 2013-01-26 MED ORDER — ALPRAZOLAM 0.25 MG PO TABS
0.2500 mg | ORAL_TABLET | Freq: Two times a day (BID) | ORAL | Status: DC | PRN
  Administered 2013-01-26 – 2013-01-29 (×3): 0.25 mg via ORAL
  Filled 2013-01-26 (×3): qty 1

## 2013-01-26 MED ORDER — VANCOMYCIN HCL IN DEXTROSE 750-5 MG/150ML-% IV SOLN
750.0000 mg | INTRAVENOUS | Status: AC
  Administered 2013-01-26: 750 mg via INTRAVENOUS
  Filled 2013-01-26: qty 150

## 2013-01-26 MED ORDER — INSULIN ASPART 100 UNIT/ML ~~LOC~~ SOLN: 0.0000 [IU] | Freq: Every day | SUBCUTANEOUS | Status: DC

## 2013-01-26 MED ORDER — VANCOMYCIN HCL IN DEXTROSE 750-5 MG/150ML-% IV SOLN
750.0000 mg | INTRAVENOUS | Status: DC
  Administered 2013-01-27 – 2013-01-31 (×5): 750 mg via INTRAVENOUS
  Filled 2013-01-26 (×6): qty 150

## 2013-01-26 NOTE — ED Provider Notes (Addendum)
CSN: [id number]     Arrival date & time 01/26/13  1601 History     First MD Initiated Contact with Patient 01/26/13 1608     Chief Complaint  Patient presents with  . Wound Infection   (Consider location/radiation/quality/duration/timing/severity/associated sxs/prior Treatment) HPI Comments: Patient presents to the ER for evaluation of worsening venous stasis ulcers on both lower legs. Patient has had problems for at least 2 months. Patient saw Doctor Jimmey Ralph today he hasn't had a large amount of tissue debrided. Doctor Jimmey Ralph felt that the area was worsening and sent the patient to the ER for possible surgical evaluation. Patient does report that she gets short of breath easily with minimal exertion. Has not had chest pain. Patient denies fever. She is taking Keflex currently for the infection.   Past Medical History  Diagnosis Date  . Obesity   . Anemia, iron deficiency   . Depression   . Diabetes mellitus type II   . Hyperlipidemia   . Hypertension   . Peptic ulcer   . History of uterine fibroid   . Hx of colonic polyps   . Diverticula, colon   . Osteoarthritis   . Low back pain   . Atrial fibrillation     permanent, on coumadin (followed by [facility])  . Pulmonary hypertension     echo 4/12:  EF 55-60%, mod LVH, mild BAE, RVE and PASP 51  . Anxiety   . DIABETES MELLITUS, TYPE II 03/05/2007  . HYPERLIPIDEMIA 03/05/2007  . ANEMIA-IRON DEFICIENCY 03/05/2007  . HYPERTENSION 03/05/2007  . DEPRESSION 03/05/2007  . LOW BACK PAIN 03/05/2007  . PERIPHERAL EDEMA 04/29/2008  . Chronic pain syndrome 06/23/2010  . FIBROIDS, UTERUS 03/05/2007  . PEPTIC ULCER DISEASE 03/05/2007  . DIVERTICULOSIS, COLON 03/05/2007  . OSTEOARTHRITIS 03/05/2007  . COLONIC POLYPS, HX OF 03/05/2007  . Hypothyroidism 10/15/2010  . Renal insufficiency 10/15/2010   Past Surgical History  Procedure Laterality Date  . Laparoscopic hysterectomy     Family History  Problem Relation Age of Onset  . Hypertension Sister     History  Substance Use Topics  . Smoking status: Former Smoker    Start date: 03/23/1972  . Smokeless tobacco: Not on file  . Alcohol Use: No   OB History   Grav Para Term Preterm Abortions TAB SAB Ect Mult Living                 Review of Systems  Respiratory: Positive for shortness of breath.   Cardiovascular: Positive for leg swelling.  Skin: Positive for wound.  All other systems reviewed and are negative.    Allergies  Clonidine derivatives  Home Medications   Current Outpatient Rx  Name  Route  Sig  Dispense  Refill  . ALPRAZolam (XANAX) 0.25 MG tablet   Oral   Take 0.25 mg by mouth 2 (two) times daily as needed. For anxiety         . cephALEXin (KEFLEX) 500 MG capsule   Oral   Take 1 capsule (500 mg total) by mouth 4 (four) times daily.   40 capsule   0   . Cholecalciferol (VITAMIN D) 1000 UNITS capsule   Oral   Take 1,000 Units by mouth daily.          . citalopram (CELEXA) 20 MG tablet      TAKE 1 TABLET EVERY DAY   30 tablet   6   . EXPIRED: diltiazem (CARDIZEM CD) 120 MG 24 hr capsule  Oral   Take 120 mg by mouth daily.          Marland Kitchen diltiazem (CARDIZEM CD) 120 MG 24 hr capsule      TAKE ONE CAPSULE BY MOUTH EVERY DAY   30 capsule   7   . docusate sodium (COLACE) 100 MG capsule   Oral   Take 100 mg by mouth 2 (two) times daily as needed. For constipation          . furosemide (LASIX) 40 MG tablet   Oral   Take 1 tablet (40 mg total) by mouth daily.   90 tablet   3   . hydrALAZINE (APRESOLINE) 50 MG tablet      TAKE 1 TABLET BY MOUTH 3 TIMES A DAY   270 tablet   3   . HYDROcodone-acetaminophen (NORCO/VICODIN) 5-325 MG per tablet      TAKE 1 TABLET BY MOUTH EVERY 8 HOURS AS NEEDED FOR PAIN   90 tablet   2   . insulin aspart (NOVOLOG) 100 UNIT/ML injection   Subcutaneous   Inject 5 Units into the skin 3 (three) times daily before meals. If CBG is greater than or equal to 150, give 5 units          . labetalol  (NORMODYNE) 300 MG tablet      TAKE 1 TABLET BY MOUTH TWICE A DAY   60 tablet   6   . levothyroxine (SYNTHROID, LEVOTHROID) 25 MCG tablet      TAKE 1 TABLET BY MOUTH DAILY   30 tablet   10   . lidocaine (LIDODERM) 5 %   Transdermal   Place 1 patch onto the skin daily. Remove & Discard patch within 12 hours or as directed by MD          . EXPIRED: lovastatin (MEVACOR) 40 MG tablet                . lovastatin (MEVACOR) 40 MG tablet      TAKE 2 TABLETS BY MOUTH EVERY DAY   60 tablet   11   . metFORMIN (GLUCOPHAGE) 500 MG tablet      TAKE 1 TABLET BY MOUTH EVERY DAY   90 tablet   3   . senna (SENOKOT) 8.6 MG TABS   Oral   Take 2 tablets by mouth daily as needed. For constipation          . sorbitol 70 % solution   Oral   Take 25 mLs by mouth daily as needed. For constipation          . warfarin (COUMADIN) 5 MG tablet   Oral   Take 5 mg by mouth daily.           Marland Kitchen warfarin (COUMADIN) 5 MG tablet      USE AS DIRECTED   50 tablet   5    BP 153/73  Pulse 89  Temp(Src) 98.6 F (37 C) (Oral)  Resp 16  SpO2 99% Physical Exam  Constitutional: She is oriented to person, place, and time. She appears well-developed and well-nourished. No distress.  HENT:  Head: Normocephalic and atraumatic.  Right Ear: Hearing normal.  Left Ear: Hearing normal.  Nose: Nose normal.  Mouth/Throat: Oropharynx is clear and moist and mucous membranes are normal.  Eyes: Conjunctivae and EOM are normal. Pupils are equal, round, and reactive to light.  Neck: Normal range of motion. Neck supple.  Cardiovascular: Regular rhythm, S1 normal and S2 normal.  Exam reveals no gallop and no friction rub.   No murmur heard. Pulmonary/Chest: Tachypnea noted. No respiratory distress. She has decreased breath sounds. She exhibits no tenderness.  Abdominal: Soft. Normal appearance and bowel sounds are normal. There is no hepatosplenomegaly. There is no tenderness. There is no rebound, no  guarding, no tenderness at McBurney's point and negative Murphy's sign. No hernia.  Musculoskeletal: Normal range of motion.  Neurological: She is alert and oriented to person, place, and time. She has normal strength. No cranial nerve deficit or sensory deficit. Coordination normal. GCS eye subscore is 4. GCS verbal subscore is 5. GCS motor subscore is 6.  Skin: Skin is warm, dry and intact. No rash noted. No cyanosis.  Psychiatric: She has a normal mood and affect. Her speech is normal and behavior is normal. Thought content normal.        ED Course   Procedures (including critical care time)  EKG:  Date: 01/26/2013  Rate: 92  Rhythm: afib vs sinus with PAC's  QRS Axis: left  Intervals: normal  ST/T Wave abnormalities: nonspecific ST/T changes  Conduction Disutrbances:none  Narrative Interpretation:   Old EKG Reviewed: unchanged    Labs Reviewed  CBC WITH DIFFERENTIAL - Abnormal; Notable for the following:    RBC 3.29 (*)    Hemoglobin 10.0 (*)    HCT 30.2 (*)    Neutrophils Relative % 83 (*)    Lymphocytes Relative 10 (*)    Lymphs Abs 0.5 (*)    All other components within normal limits  COMPREHENSIVE METABOLIC PANEL - Abnormal; Notable for the following:    Glucose, Bld 124 (*)    BUN 26 (*)    Creatinine, Ser 1.48 (*)    Albumin 3.3 (*)    GFR calc non Af Amer 32 (*)    GFR calc Af Amer 38 (*)    All other components within normal limits  PRO B NATRIURETIC PEPTIDE - Abnormal; Notable for the following:    Pro B Natriuretic peptide (BNP) 3453.0 (*)    All other components within normal limits  PROTIME-INR - Abnormal; Notable for the following:    Prothrombin Time 16.3 (*)    All other components within normal limits  CULTURE, BLOOD (ROUTINE X 2)  CULTURE, BLOOD (ROUTINE X 2)  WOUND CULTURE  WOUND CULTURE  TROPONIN I  SEDIMENTATION RATE  C-REACTIVE PROTEIN  HEMOGLOBIN A1C   No results found.  Diagnosis: Bilateral lower extremity venous stasis ulcers  with infection  MDM  Patient presents to the ER for evaluation of lower extremity wounds. Patient has been treated as an outpatient for bilateral venous stasis ulcers for several months. Foods are worsening. Patient has thick purulent drainage from both sites. She has been on outpatient antibiotics and had intermittent debridement without improvement. Patient was referred to the ER today for further evaluation. It is felt that the patient will require hospitalization for further management.   Gilda Crease, MD 01/26/13 1742  Gilda Crease, MD 01/26/13 1747  Gilda Crease, MD 01/26/13 [phone]

## 2013-01-26 NOTE — H&P (Addendum)
[facility] History and Physical  [name]  YQM:[id number]  DOB: 11/10/1931  DOA: 01/26/2013  Referring physician: Dr. Oletta Cohn PCP: Oliver Barre, MD  Chief Complaint: Leg ulcer   HPI: [name] is a 79 y.o. female with Past medical history of diabetes mellitus, hypertension, hypothyroidism, atrial fibrillation on Coumadin , diastolic dysfunction. She presented to the ED with the complaint of worsening bilateral leg ulcers. She has chronic diastolic dysfunction and chronic venous stasis edema of the lower extremity. Has been on Lasix. About 2-1/2 month ago she started noticing blisters on her left leg she weeks ago she also noted similar blister on the right leg.  Left leg blisters starting to open wound with swelling and redness and therefore she went to see the PCP we initially started her on Keflex on 6/16. Later on after a month when she went with the worsening swelling of the right leg as well, PCP and he prescribed a dose of Keflex and add amoxicillin on 7/16. Since last few days the niece was taking care of the patient noted to sudden worsening of the right leg blister into an open wound and foul smelling discharge coming out of it. They took her to the wound care center where debridement was performed, and apparently the niece was informed that her right leg infection has been on to the bone.  She had one episode of loose bowel motion this morning, without any blood. She denies any complaint of claudication symptoms before the onset. She mentions after her hip fracture 2 years ago she has been having difficulty ambulating. The patient denies any complaint of fever, chills, chest pain, shortness of breath, palpitation, dizziness, nausea, abdominal pain.  Review of Systems: as mentioned in the history of present illness.  A Comprehensive review of the other systems is negative.  Past Medical History  Diagnosis Date  . Obesity   . Anemia, iron deficiency   . Depression   .  Diabetes mellitus type II   . Hyperlipidemia   . Hypertension   . Peptic ulcer   . History of uterine fibroid   . Hx of colonic polyps   . Diverticula, colon   . Osteoarthritis   . Low back pain   . Atrial fibrillation     permanent, on coumadin (followed by [facility])  . Pulmonary hypertension     echo 4/12:  EF 55-60%, mod LVH, mild BAE, RVE and PASP 51  . Anxiety   . DIABETES MELLITUS, TYPE II 03/05/2007  . HYPERLIPIDEMIA 03/05/2007  . ANEMIA-IRON DEFICIENCY 03/05/2007  . HYPERTENSION 03/05/2007  . DEPRESSION 03/05/2007  . LOW BACK PAIN 03/05/2007  . PERIPHERAL EDEMA 04/29/2008  . Chronic pain syndrome 06/23/2010  . FIBROIDS, UTERUS 03/05/2007  . PEPTIC ULCER DISEASE 03/05/2007  . DIVERTICULOSIS, COLON 03/05/2007  . OSTEOARTHRITIS 03/05/2007  . COLONIC POLYPS, HX OF 03/05/2007  . Hypothyroidism 10/15/2010  . Renal insufficiency 10/15/2010   Past Surgical History  Procedure Laterality Date  . Laparoscopic hysterectomy     Social History:  reports that she has quit smoking. She started smoking about 40 years ago. She does not have any smokeless tobacco history on file. She reports that she does not drink alcohol or use illicit drugs. Patient is coming from home. Patient can participate in ADLs.  Allergies  Allergen Reactions  . Clonidine Derivatives Rash    Skin breaks out    Family History  Problem Relation Age of Onset  . Hypertension Sister  Prior to Admission medications   Medication Sig Start Date End Date Taking? Authorizing Provider  ALPRAZolam (XANAX) 0.25 MG tablet Take 0.25 mg by mouth 2 (two) times daily as needed. For anxiety    Historical Provider, MD  cephALEXin (KEFLEX) 500 MG capsule Take 1 capsule (500 mg total) by mouth 4 (four) times daily. 01/10/13   Corwin Levins, MD  Cholecalciferol (VITAMIN D) 1000 UNITS capsule Take 1,000 Units by mouth daily.     Historical Provider, MD  citalopram (CELEXA) 20 MG tablet TAKE 1 TABLET EVERY DAY 07/02/12   Corwin Levins, MD   diltiazem (CARDIZEM CD) 120 MG 24 hr capsule Take 120 mg by mouth daily.  11/03/10 04/22/12  Corwin Levins, MD  diltiazem (CARDIZEM CD) 120 MG 24 hr capsule TAKE ONE CAPSULE BY MOUTH EVERY DAY 06/04/12   Corwin Levins, MD  docusate sodium (COLACE) 100 MG capsule Take 100 mg by mouth 2 (two) times daily as needed. For constipation     Historical Provider, MD  furosemide (LASIX) 40 MG tablet Take 1 tablet (40 mg total) by mouth daily. 01/10/13   Corwin Levins, MD  hydrALAZINE (APRESOLINE) 50 MG tablet TAKE 1 TABLET BY MOUTH 3 TIMES A DAY 11/24/12   Corwin Levins, MD  HYDROcodone-acetaminophen (NORCO/VICODIN) 5-325 MG per tablet TAKE 1 TABLET BY MOUTH EVERY 8 HOURS AS NEEDED FOR PAIN 01/10/13   Corwin Levins, MD  insulin aspart (NOVOLOG) 100 UNIT/ML injection Inject 5 Units into the skin 3 (three) times daily before meals. If CBG is greater than or equal to 150, give 5 units     Historical Provider, MD  labetalol (NORMODYNE) 300 MG tablet TAKE 1 TABLET BY MOUTH TWICE A DAY 11/04/12   Corwin Levins, MD  levothyroxine (SYNTHROID, LEVOTHROID) 25 MCG tablet TAKE 1 TABLET BY MOUTH DAILY 08/29/12   Corwin Levins, MD  lidocaine (LIDODERM) 5 % Place 1 patch onto the skin daily. Remove & Discard patch within 12 hours or as directed by MD     Historical Provider, MD  lovastatin (MEVACOR) 40 MG tablet   12/31/10 04/22/12  Corwin Levins, MD  lovastatin (MEVACOR) 40 MG tablet TAKE 2 TABLETS BY MOUTH EVERY DAY 12/02/12   Corwin Levins, MD  metFORMIN (GLUCOPHAGE) 500 MG tablet TAKE 1 TABLET BY MOUTH EVERY DAY 06/29/12   Corwin Levins, MD  senna (SENOKOT) 8.6 MG TABS Take 2 tablets by mouth daily as needed. For constipation     Historical Provider, MD  sorbitol 70 % solution Take 25 mLs by mouth daily as needed. For constipation     Historical Provider, MD  warfarin (COUMADIN) 5 MG tablet Take 5 mg by mouth daily.      Historical Provider, MD  warfarin (COUMADIN) 5 MG tablet USE AS DIRECTED 05/09/12   Corwin Levins, MD    Physical  Exam: Filed Vitals:   01/26/13 1745 01/26/13 1815 01/26/13 1845 01/26/13 1900  BP: 148/97 151/80  165/82  Pulse:  92  88  Temp:      TempSrc:      Resp: 26 24 22 14   SpO2:  100%  96%    General: Alert, Awake and Oriented to Time, Place and Person. Appear in moderate distress Eyes: PERRL ENT: Oral Mucosa clear moist. Neck: mild  JVD, no Carotid Bruits, no Stiffness Cardiovascular: S1 and S2 Present,  systolic aortic Murmur, Peripheral Pulses Present Respiratory: Bilateral Air entry equal and Decreased, Clear  to Auscultation,  Abdomen: Bowel Sound Present, Soft and Non tender, Skin:  No Rash other than the leg swelling  Extremities:  Bilateral Pedal edema, diffuse tenderness in the leg left more than right. Bilateral debrided ulcers , with serosanguineous discharge, and necrotic tissue in the base.  Neurologic: Grossly Unremarkable.  Labs on Admission:  Basic Metabolic Panel:  Recent Labs Lab 01/26/13 1658  NA 143  K 4.2  CL 109  CO2 22  GLUCOSE 124*  BUN 26*  CREATININE 1.48*  CALCIUM 9.8   Liver Function Tests:  Recent Labs Lab 01/26/13 1658  AST 15  ALT 12  ALKPHOS 72  BILITOT 0.4  PROT 6.3  ALBUMIN 3.3*   No results found for this basename: LIPASE, AMYLASE,  in the last 168 hours No results found for this basename: AMMONIA,  in the last 168 hours CBC:  Recent Labs Lab 01/26/13 1658  WBC 5.5  NEUTROABS 4.6  HGB 10.0*  HCT 30.2*  MCV 91.8  PLT 244   Cardiac Enzymes:  Recent Labs Lab 01/26/13 1659  TROPONINI <0.30    BNP (last 3 results)  Recent Labs  01/26/13 1659  PROBNP 3453.0*   CBG: No results found for this basename: GLUCAP,  in the last 168 hours  Radiological Exams on Admission: Dg Chest 2 View  01/26/2013   *RADIOLOGY REPORT*  Clinical Data: 79 year old female with pain and peripheral edema. History of diabetes and hypertension.  CHEST - 2 VIEW  Comparison: 05/18/2011 and prior chest radiographs dating back to 10/25/2006   Findings: Cardiomegaly and pulmonary vascular congestion noted. There is no evidence of focal airspace disease, pulmonary edema, suspicious pulmonary nodule/mass, pleural effusion, or pneumothorax. No acute bony abnormalities are identified.  IMPRESSION: Cardiomegaly with pulmonary vascular congestion.   Original Report Authenticated By: Harmon Pier, M.D.   Dg Tibia/fibula Left  01/26/2013   *RADIOLOGY REPORT*  Clinical Data: Left lower extremity wound infection.  LEFT TIBIA AND FIBULA - 2 VIEW  Comparison: None  Findings: Soft tissue irregularity along the lower left leg noted. There is no radiographic evidence of osteomyelitis. No fracture, subluxation or dislocation noted. Moderate to severe tricompartmental degenerative knee changes are present. Vascular calcifications are identified.  IMPRESSION: Soft tissue wound  without acute bony abnormality.   Original Report Authenticated By: Harmon Pier, M.D.   Dg Tibia/fibula Right  01/26/2013   *RADIOLOGY REPORT*  Clinical Data: Right lower extremity wound infection.  RIGHT TIBIA AND FIBULA - 2 VIEW  Comparison: None  Findings: Soft tissue irregularity along the lower right leg noted. There is no evidence of acute fracture, subluxation or dislocation. No radiographic evidence of osteomyelitis is noted. Moderate tricompartmental degenerative changes within the knee are present. Vascular calcifications are identified.  IMPRESSION: Soft tissue wound  without evidence of acute bony abnormality or radiographic signs of osteomyelitis.   Original Report Authenticated By: Harmon Pier, M.D.     Assessment/Plan Principal Problem:   Bilateral lower leg cellulitis Active Problems:   DIABETES MELLITUS, TYPE II   OBESITY   HYPERTENSION   FIBRILLATION, ATRIAL   Hypothyroidism   Diastolic dysfunction   1. Bilateral lower leg cellulitis Most likely right leg osteomyelitis,  Patient is diabetic. And has chronic nonhealing ulcers. She has failed outpatient oral  antibiotics. At present we will obtain ESR, CRP. Blood culture and wound culture has already been obtained.  Patient will be started on broad-spectrum antibiotics with IV vancomycin and IV Zosyn based on pharmacy consult.  Based on the cultures  antibiotics can be narrowed down. Patient may require a infection disease consult for long-term antibiotic management.  2. diabetes mellitus Patient will be treated with insulin sliding scale. Metformin will be on hold.  3. atrial fibrillation Will continue Coumadin, INR subtherapeutic,  Continue Cardizem and labetalol for control.  4. Chronic diastolic dysfunction: Continue lasix. BNP elevated. Will monitor i and o.  5. Hypertension Continue home dose of antihypertensive.  6. pain control As needed Norco and OxyIR.  DVT Prophylaxis: subcutaneous lovenox, patient already on Coumadin, INR subtherapeutic Nutrition: Cardiac and diabetic diet  Code Status:  DNR/DNI, discussed with the patient in front of her niece.  Family Communication:  Patient's niece was present at the time of the evaluation and has been explained about the plan.   Author: Lynden Oxford, MD [facility] Pager: [phone] 01/26/2013 7:48 PM    If 7PM-7AM, please contact night-coverage [email or web address] Password [email or web address]

## 2013-01-26 NOTE — Progress Notes (Signed)
ANTIBIOTIC CONSULT NOTE - INITIAL  Pharmacy Consult for vancomycin and zosyn Indication: cellulitis/stasis ulcer infection  Allergies  Allergen Reactions  . Clonidine Derivatives Rash    Skin breaks out    Patient Measurements: weight 74 kg , height 64 inches    Vital Signs: Temp: 98.6 F (37 C) (08/01 1601) Temp src: Oral (08/01 1601) BP: 165/82 mmHg (08/01 1900) Pulse Rate: 88 (08/01 1900) Intake/Output from previous day:   Intake/Output from this shift:    Labs:  Recent Labs  01/26/13 1658  WBC 5.5  HGB 10.0*  PLT 244  CREATININE 1.48*   The CrCl is unknown because both a height and weight (above a minimum accepted value) are required for this calculation. No results found for this basename: VANCOTROUGH, VANCOPEAK, VANCORANDOM, GENTTROUGH, GENTPEAK, GENTRANDOM, TOBRATROUGH, TOBRAPEAK, TOBRARND, AMIKACINPEAK, AMIKACINTROU, AMIKACIN,  in the last 72 hours   Microbiology: No results found for this or any previous visit (from the past 720 hour(s)).  Medical History: Past Medical History  Diagnosis Date  . Obesity   . Anemia, iron deficiency   . Depression   . Diabetes mellitus type II   . Hyperlipidemia   . Hypertension   . Peptic ulcer   . History of uterine fibroid   . Hx of colonic polyps   . Diverticula, colon   . Osteoarthritis   . Low back pain   . Atrial fibrillation     permanent, on coumadin (followed by [facility])  . Pulmonary hypertension     echo 4/12:  EF 55-60%, mod LVH, mild BAE, RVE and PASP 51  . Anxiety   . DIABETES MELLITUS, TYPE II 03/05/2007  . HYPERLIPIDEMIA 03/05/2007  . ANEMIA-IRON DEFICIENCY 03/05/2007  . HYPERTENSION 03/05/2007  . DEPRESSION 03/05/2007  . LOW BACK PAIN 03/05/2007  . PERIPHERAL EDEMA 04/29/2008  . Chronic pain syndrome 06/23/2010  . FIBROIDS, UTERUS 03/05/2007  . PEPTIC ULCER DISEASE 03/05/2007  . DIVERTICULOSIS, COLON 03/05/2007  . OSTEOARTHRITIS 03/05/2007  . COLONIC POLYPS, HX OF 03/05/2007  . Hypothyroidism  10/15/2010  . Renal insufficiency 10/15/2010    Assessment: Patient is a 79 y.o F with bilateral venous stasis ulcers who was sent to the ED today by PCP d/t worsening of LE ulcers and for evaluation of possible debridement. To start broad abx for wound infection. Scr 1.48 (crcl~36)  Goal of Therapy:  Vancomycin trough level 10-15 mcg/ml  Plan:  1) vancomycin 750mg  IV q24h 2) zosyn 3.375gm IV x1 over 30 min, then 3.375gm q8h (over 4 hours)  Mikhaila Roh P 01/26/2013,7:56 PM

## 2013-01-26 NOTE — Progress Notes (Signed)
ANTICOAGULATION CONSULT NOTE - Initial Consult  Pharmacy Consult for Coumadin Indication: atrial fibrillation  Allergies  Allergen Reactions  . Clonidine Derivatives Rash    Skin breaks out    Patient Measurements:    Vital Signs: Temp: 98.6 F (37 C) (08/01 2121) Temp src: Oral (08/01 2121) BP: 164/90 mmHg (08/01 2121) Pulse Rate: 100 (08/01 2121)  Labs:  Recent Labs  01/26/13 1658 01/26/13 1659  HGB 10.0*  --   HCT 30.2*  --   PLT 244  --   LABPROT 16.3*  --   INR 1.34  --   CREATININE 1.48*  --   TROPONINI  --  <0.30    The CrCl is unknown because both a height and weight (above a minimum accepted value) are required for this calculation.   Medical History: Past Medical History  Diagnosis Date  . Obesity   . Anemia, iron deficiency   . Depression   . Diabetes mellitus type II   . Hyperlipidemia   . Hypertension   . Peptic ulcer   . History of uterine fibroid   . Hx of colonic polyps   . Diverticula, colon   . Osteoarthritis   . Low back pain   . Atrial fibrillation     permanent, on coumadin (followed by [facility])  . Pulmonary hypertension     echo 4/12:  EF 55-60%, mod LVH, mild BAE, RVE and PASP 51  . Anxiety   . DIABETES MELLITUS, TYPE II 03/05/2007  . HYPERLIPIDEMIA 03/05/2007  . ANEMIA-IRON DEFICIENCY 03/05/2007  . HYPERTENSION 03/05/2007  . DEPRESSION 03/05/2007  . LOW BACK PAIN 03/05/2007  . PERIPHERAL EDEMA 04/29/2008  . Chronic pain syndrome 06/23/2010  . FIBROIDS, UTERUS 03/05/2007  . PEPTIC ULCER DISEASE 03/05/2007  . DIVERTICULOSIS, COLON 03/05/2007  . OSTEOARTHRITIS 03/05/2007  . COLONIC POLYPS, HX OF 03/05/2007  . Hypothyroidism 10/15/2010  . Renal insufficiency 10/15/2010    Medications:  Recent coumadin regimen (per [facility] on 7/16): 7.5 mg on Tuesday, Thursday, Saturday and Sunday, 5mg  on Mondays, Wednesdays and Fridays  Assessment: 81 YOF on chronic coumadin for Afib admitted for worsening bilateral venous stasis ulcers,  pharmacy is consulted to dose coumadin. INR subtherapeutic = 1.34, Hgb = 10, plt = 244  Goal of Therapy:  INR 2-3 Monitor platelets by anticoagulation protocol: Yes   Plan:  - Coumadin 7.5 mg po x 1 - f/u daily PT/INR  Bayard Hugger, PharmD, BCPS  Clinical Pharmacist  Pager: [phone]   01/26/2013,9:26 PM

## 2013-01-26 NOTE — ED Notes (Signed)
Pt here with bilateral venous stasis ulcers with increased drainage; pt sts wounds x 2 months; pt sent here by PCP for further eval

## 2013-01-27 DIAGNOSIS — L02419 Cutaneous abscess of limb, unspecified: Principal | ICD-10-CM

## 2013-01-27 DIAGNOSIS — I4891 Unspecified atrial fibrillation: Secondary | ICD-10-CM

## 2013-01-27 DIAGNOSIS — I5033 Acute on chronic diastolic (congestive) heart failure: Secondary | ICD-10-CM

## 2013-01-27 DIAGNOSIS — N182 Chronic kidney disease, stage 2 (mild): Secondary | ICD-10-CM | POA: Diagnosis present

## 2013-01-27 LAB — PROTIME-INR: INR: 1.39 (ref 0.00–1.49)

## 2013-01-27 LAB — COMPREHENSIVE METABOLIC PANEL
ALT: 10 U/L (ref 0–35)
Albumin: 3.1 g/dL — ABNORMAL LOW (ref 3.5–5.2)
Calcium: 9.5 mg/dL (ref 8.4–10.5)
GFR calc Af Amer: 40 mL/min — ABNORMAL LOW (ref >90)
Glucose, Bld: 134 mg/dL — ABNORMAL HIGH (ref 70–99)
Sodium: 139 mEq/L (ref 135–145)
Total Protein: 6.2 g/dL (ref 6.0–8.3)

## 2013-01-27 LAB — CBC
HCT: 29.5 % — ABNORMAL LOW (ref 36.0–46.0)
Hemoglobin: 9.7 g/dL — ABNORMAL LOW (ref 12.0–15.0)
MCHC: 32.9 g/dL (ref 30.0–36.0)
MCV: 91.3 fL (ref 78.0–100.0)
WBC: 7.9 10*3/uL (ref 4.0–10.5)

## 2013-01-27 LAB — GLUCOSE, CAPILLARY
Glucose-Capillary: 142 mg/dL — ABNORMAL HIGH (ref 70–99)
Glucose-Capillary: 150 mg/dL — ABNORMAL HIGH (ref 70–99)

## 2013-01-27 LAB — HEMOGLOBIN A1C: Mean Plasma Glucose: 148 mg/dL — ABNORMAL HIGH (ref <117)

## 2013-01-27 MED ORDER — FUROSEMIDE 10 MG/ML IJ SOLN: 40.0000 mg | Freq: Two times a day (BID) | INTRAMUSCULAR | Status: DC

## 2013-01-27 MED ORDER — WARFARIN SODIUM 7.5 MG PO TABS
7.5000 mg | ORAL_TABLET | Freq: Once | ORAL | Status: AC
  Administered 2013-01-27: 7.5 mg via ORAL
  Filled 2013-01-27: qty 1

## 2013-01-27 MED ORDER — LOVASTATIN 20 MG PO TABS
40.0000 mg | ORAL_TABLET | Freq: Two times a day (BID) | ORAL | Status: DC
  Administered 2013-01-27 – 2013-01-29 (×4): 40 mg via ORAL
  Filled 2013-01-27 (×5): qty 2

## 2013-01-27 MED ORDER — ENOXAPARIN SODIUM 100 MG/ML ~~LOC~~ SOLN
85.0000 mg | Freq: Two times a day (BID) | SUBCUTANEOUS | Status: DC
  Administered 2013-01-27 – 2013-01-29 (×5): 85 mg via SUBCUTANEOUS
  Filled 2013-01-27 (×6): qty 1

## 2013-01-27 MED ORDER — FUROSEMIDE 40 MG PO TABS
60.0000 mg | ORAL_TABLET | Freq: Two times a day (BID) | ORAL | Status: DC
  Administered 2013-01-27 – 2013-01-29 (×5): 60 mg via ORAL
  Filled 2013-01-27 (×6): qty 1

## 2013-01-27 NOTE — Progress Notes (Signed)
[facility] PROGRESS NOTE  [name] NFA:[id number] DOB: 1932-05-17 DOA: 01/26/2013 PCP: Oliver Barre, MD  Assessment/Plan: Principal Problem:   Bilateral lower leg cellulitis: Continue vancomycin and Zosyn. Was normal CRP and sed rate, I do not think is osteomyelitis. At this time no indication for MRI. Have asked wound care for consult Active Problems:   DIABETES MELLITUS, TYPE II controlled with renal dysfunction : Continue sliding scale   OBESITY   HYPERTENSION: Continue home meds. Monitor blood pressure    FIBRILLATION, ATRIAL: INR subtherapeutic. The Lovenox bridge    Hypothyroidism: Continue Synthroid    Acute on chronic Diastolic dysfunction: Increase Lasix dose. Monitor output  Chronic stage II kidney disease: Monitor renal function while diuresing  Anemia: Likely secondary to chronic disease   Code Status: DO NOT RESUSCITATE  Family Communication: Spoke with her niece over the phone.  Disposition Plan: Likely home in a few days with home health   Consultants:  Wound care  Procedures:  None  Antibiotics:  IV Zosyn and vancomycin day 2  HPI/Subjective: Patient denies any pain or shortness of breath  Objective: Filed Vitals:   01/26/13 1845 01/26/13 1900 01/26/13 2121 01/27/13 0539  BP:  165/82 164/90 144/54  Pulse:  88 100 78  Temp:   98.6 F (37 C) 98.6 F (37 C)  TempSrc:   Oral Oral  Resp: 22 14 18 16   SpO2:  96% 98% 97%   No intake or output data in the 24 hours ending 01/27/13 0734 There were no vitals filed for this visit.  Exam:   General:  Somnolent  Cardiovascular: Regular rate and rhythm, occasional ectopic beat  Respiratory: Decreased breath sounds throughout  Abdomen: Soft, nontender, nondistended, hypoactive bowel sounds  Musculoskeletal: Bilateral lower extremity is wrapped, 1+ pitting edema, active drainage   Data Reviewed: Basic Metabolic Panel:  Recent Labs Lab 01/26/13 1658 01/27/13 0459  NA 143 139  K 4.2  4.0  CL 109 107  CO2 22 22  GLUCOSE 124* 134*  BUN 26* 27*  CREATININE 1.48* 1.40*  CALCIUM 9.8 9.5   Liver Function Tests:  Recent Labs Lab 01/26/13 1658 01/27/13 0459  AST 15 15  ALT 12 10  ALKPHOS 72 65  BILITOT 0.4 0.6  PROT 6.3 6.2  ALBUMIN 3.3* 3.1*   CBC:  Recent Labs Lab 01/26/13 1658 01/27/13 0459  WBC 5.5 7.9  NEUTROABS 4.6  --   HGB 10.0* 9.7*  HCT 30.2* 29.5*  MCV 91.8 91.3  PLT 244 225   Cardiac Enzymes:  Recent Labs Lab 01/26/13 1659  TROPONINI <0.30   BNP (last 3 results)  Recent Labs  01/26/13 1659  PROBNP 3453.0*   CBG:  Recent Labs Lab 01/26/13 2153 01/27/13 0640  GLUCAP 170* 142*    Recent Results (from the past 240 hour(s))  WOUND CULTURE     Status: None   Collection Time    01/26/13  5:45 PM      Result Value Range Status   Specimen Description WOUND LEG LEFT   Final   Special Requests Normal   Final   Gram Stain PENDING   Incomplete   Culture Culture reincubated for better growth   Final   Report Status PENDING   Incomplete  WOUND CULTURE     Status: None   Collection Time    01/26/13  5:45 PM      Result Value Range Status   Specimen Description WOUND LEG LEFT   Final   Special  Requests LEFT LOWER LEG   Final   Gram Stain PENDING   Incomplete   Culture Culture reincubated for better growth   Final   Report Status PENDING   Incomplete     Studies: Dg Chest 2 View  01/26/2013  IMPRESSION: Cardiomegaly with pulmonary vascular congestion.   Original Report Authenticated By: Harmon Pier, M.D.   Dg Tibia/fibula Left  01/26/2013   IMPRESSION: Soft tissue wound  without acute bony abnormality.   Original Report Authenticated By: Harmon Pier, M.D.   Dg Tibia/fibula Right  01/26/2013    IMPRESSION: Soft tissue wound  without evidence of acute bony abnormality or radiographic signs of osteomyelitis.   Original Report Authenticated By: Harmon Pier, M.D.    Scheduled Meds: . citalopram  20 mg Oral Daily  . diltiazem  120  mg Oral Daily  . enoxaparin (LOVENOX) injection  40 mg Subcutaneous Q24H  . furosemide  40 mg Intravenous Q12H  . hydrALAZINE  50 mg Oral Q8H  . insulin aspart  0-15 Units Subcutaneous TID WC  . insulin aspart  0-5 Units Subcutaneous QHS  . labetalol  300 mg Oral BID  . levothyroxine  25 mcg Oral QAC breakfast  . piperacillin-tazobactam (ZOSYN)  IV  3.375 g Intravenous Q8H  . polyethylene glycol  17 g Oral Daily  . simvastatin  40 mg Oral q1800  . vancomycin  750 mg Intravenous Q24H  . Warfarin - Pharmacist Dosing Inpatient   Does not apply q1800   Continuous Infusions:   Principal Problem:   Bilateral lower leg cellulitis Active Problems:   DIABETES MELLITUS, TYPE II   OBESITY   HYPERTENSION   FIBRILLATION, ATRIAL   Hypothyroidism   Diastolic dysfunction    Time spent: 20 minutes    Hollice Espy  [facility] Pager [phone]. If 7PM-7AM, please contact night-coverage at [email or web address], password [email or web address] 01/27/2013, 7:34 AM  LOS: 1 day

## 2013-01-27 NOTE — Progress Notes (Signed)
Bilat dressing changes administered. Pt tolerated well.

## 2013-01-27 NOTE — Progress Notes (Addendum)
ANTICOAGULATION CONSULT NOTE - Followup  Pharmacy Consult for Coumadin Indication: atrial fibrillation  Allergies  Allergen Reactions  . Clonidine Derivatives Rash    Skin breaks out    Patient Measurements:    Vital Signs: Temp: 98.6 F (37 C) (08/02 0539) Temp src: Oral (08/02 0539) BP: 144/54 mmHg (08/02 0539) Pulse Rate: 78 (08/02 0539)  Labs:  Recent Labs  01/26/13 1658 01/26/13 1659 01/27/13 0459  HGB 10.0*  --  9.7*  HCT 30.2*  --  29.5*  PLT 244  --  225  LABPROT 16.3*  --  16.7*  INR 1.34  --  1.39  CREATININE 1.48*  --  1.40*  TROPONINI  --  <0.30  --     The CrCl is unknown because both a height and weight (above a minimum accepted value) are required for this calculation.   Medical History: Past Medical History  Diagnosis Date  . Obesity   . Anemia, iron deficiency   . Depression   . Diabetes mellitus type II   . Hyperlipidemia   . Hypertension   . Peptic ulcer   . History of uterine fibroid   . Hx of colonic polyps   . Diverticula, colon   . Osteoarthritis   . Low back pain   . Atrial fibrillation     permanent, on coumadin (followed by [facility] coumadin clinic)  . Pulmonary hypertension     echo 4/12:  EF 55-60%, mod LVH, mild BAE, RVE and PASP 51  . Anxiety   . DIABETES MELLITUS, TYPE II 03/05/2007  . HYPERLIPIDEMIA 03/05/2007  . ANEMIA-IRON DEFICIENCY 03/05/2007  . HYPERTENSION 03/05/2007  . DEPRESSION 03/05/2007  . LOW BACK PAIN 03/05/2007  . PERIPHERAL EDEMA 04/29/2008  . Chronic pain syndrome 06/23/2010  . FIBROIDS, UTERUS 03/05/2007  . PEPTIC ULCER DISEASE 03/05/2007  . DIVERTICULOSIS, COLON 03/05/2007  . OSTEOARTHRITIS 03/05/2007  . COLONIC POLYPS, HX OF 03/05/2007  . Hypothyroidism 10/15/2010  . Renal insufficiency 10/15/2010    Medications:  Recent coumadin regimen (per [facility] cou[facility]in clinic on 7/16): 7.5 mg on Tuesday, Thursday, Saturday and Sunday, 5mg  on Mondays, Wednesdays and Fridays  Assessment: 81 YOF on chronic coumadin for Afib admitted  for worsening bilateral venous stasis ulcers, pharmacy is consulted to dose coumadin. After patient received a 7.5mg  dose, the INR increased from 1.34 to 1.39. Hgb = 9.7, plt = 225  Goal of Therapy:  INR 2-3 Monitor platelets by anticoagulation protocol: Yes   Plan:  - Coumadin 7.5mg  po x 1 - f/u daily PT/INR  Christiane Ha A. Lenon Ahmadi, PharmD Clinical Pharmacist - Resident Pager: [phone] Pharmacy: [phone] 01/27/2013 10:43 AM  Addendum:  Adding full dose lovenox while INR is subtherapeutic. Received a dose of 40mg  lovenox last night.   Plan: 1. Lovenox 85mg  SQ Q12H 2. CBC Q72H  3. Will need to watch renal fxn, closely  Lysle Pearl, PharmD, BCPS Pager # [phone] 01/27/2013 11:41 AM

## 2013-01-28 LAB — PROTIME-INR: INR: 1.86 — ABNORMAL HIGH (ref 0.00–1.49)

## 2013-01-28 LAB — CBC
HCT: 28 % — ABNORMAL LOW (ref 36.0–46.0)
MCV: 91.2 fL (ref 78.0–100.0)
RBC: 3.07 MIL/uL — ABNORMAL LOW (ref 3.87–5.11)
WBC: 8.4 10*3/uL (ref 4.0–10.5)

## 2013-01-28 LAB — GLUCOSE, CAPILLARY
Glucose-Capillary: 132 mg/dL — ABNORMAL HIGH (ref 70–99)
Glucose-Capillary: 195 mg/dL — ABNORMAL HIGH (ref 70–99)

## 2013-01-28 LAB — BASIC METABOLIC PANEL
CO2: 23 mEq/L (ref 19–32)
Chloride: 105 mEq/L (ref 96–112)
Potassium: 3.4 mEq/L — ABNORMAL LOW (ref 3.5–5.1)
Sodium: 140 mEq/L (ref 135–145)

## 2013-01-28 MED ORDER — WARFARIN SODIUM 2.5 MG PO TABS
2.5000 mg | ORAL_TABLET | Freq: Once | ORAL | Status: AC
  Administered 2013-01-28: 2.5 mg via ORAL
  Filled 2013-01-28: qty 1

## 2013-01-28 MED ORDER — POTASSIUM CHLORIDE CRYS ER 20 MEQ PO TBCR
20.0000 meq | EXTENDED_RELEASE_TABLET | Freq: Every day | ORAL | Status: DC
  Administered 2013-01-28 – 2013-02-01 (×5): 20 meq via ORAL
  Filled 2013-01-28 (×5): qty 1

## 2013-01-28 NOTE — Progress Notes (Signed)
ANTICOAGULATION CONSULT NOTE - Followup  Pharmacy Consult for Coumadin/Enoxaparin Indication: atrial fibrillation  Allergies  Allergen Reactions  . Clonidine Derivatives Rash    Skin breaks out    Patient Measurements: Height: 5\' 3"  (160 cm) Weight: 189 lb 2.5 oz (85.8 kg) IBW/kg (Calculated) : 52.4  Vital Signs: Temp: 99 F (37.2 C) (08/03 0648) BP: 163/78 mmHg (08/03 0648) Pulse Rate: 100 (08/03 0648)  Labs:  Recent Labs  01/26/13 1658 01/26/13 1659 01/27/13 0459 01/28/13 0448  HGB 10.0*  --  9.7* 9.3*  HCT 30.2*  --  29.5* 28.0*  PLT 244  --  225 210  LABPROT 16.3*  --  16.7* 20.9*  INR 1.34  --  1.39 1.86*  CREATININE 1.48*  --  1.40* 1.44*  TROPONINI  --  <0.30  --   --     Estimated Creatinine Clearance: 32.9 ml/min (by C-G formula based on Cr of 1.44).   Medical History: Past Medical History  Diagnosis Date  . Obesity   . Anemia, iron deficiency   . Depression   . Diabetes mellitus type II   . Hyperlipidemia   . Hypertension   . Peptic ulcer   . History of uterine fibroid   . Hx of colonic polyps   . Diverticula, colon   . Osteoarthritis   . Low back pain   . Atrial fibrillation     permanent, on coumadin (followed by [facility]in clinic)  . Pulmonary hypertension     echo 4/12:  EF 55-60%, mod LVH, mild BAE, RVE and PASP 51  . Anxiety   . DIABETES MELLITUS, TYPE II 03/05/2007  . HYPERLIPIDEMIA 03/05/2007  . ANEMIA-IRON DEFICIENCY 03/05/2007  . HYPERTENSION 03/05/2007  . DEPRESSION 03/05/2007  . LOW BACK PAIN 03/05/2007  . PERIPHERAL EDEMA 04/29/2008  . Chronic pain syndrome 06/23/2010  . FIBROIDS, UTERUS 03/05/2007  . PEPTIC ULCER DISEASE 03/05/2007  . DIVERTICULOSIS, COLON 03/05/2007  . OSTEOARTHRITIS 03/05/2007  . COLONIC POLYPS, HX OF 03/05/2007  . Hypothyroidism 10/15/2010  . Renal insufficiency 10/15/2010    Medications:  Recent coumadin regimen (per [facility] on 7/16): 7.5 mg on Tuesday, Thursday, Saturday and Sunday, 5mg  on Mondays,  Wednesdays and Fridays  Assessment: 81 YOF on chronic coumadin for Afib admitted for worsening bilateral venous stasis ulcers, pharmacy is consulted to dose coumadin/lovenox. INR is subtherapeutic 1.86, but INR increased significantly from yesterday. Hgb = 9.3, plt = 210  Goal of Therapy:  INR 2-3 Monitor platelets by anticoagulation protocol: Yes   Plan:  - Coumadin 2.5mg  x 1 - Continue lovenox 85mg  SQ Q12hrs - f/u daily PT/INR and renal function - CBC Q72hrs  Christiane Ha A. Lenon Ahmadi, PharmD Clinical Pharmacist - Resident Pager: [phone] Pharmacy: [phone] 01/28/2013 9:49 AM

## 2013-01-28 NOTE — Progress Notes (Signed)
[facility] PROGRESS NOTE  [name] XBJ:[id number] DOB: Mar 18, 1932 DOA: 01/26/2013 PCP: Oliver Barre, MD  Assessment/Plan: Principal Problem:   Bilateral lower leg cellulitis: Continue vancomycin and Zosyn. Was normal CRP and sed rate, I do not think is osteomyelitis. At this time no indication for MRI. Have asked wound care for consult Active Problems:   DIABETES MELLITUS, TYPE II controlled with renal dysfunction : Continue sliding scale   OBESITY   HYPERTENSION: Continue home meds. Monitor blood pressure    FIBRILLATION, ATRIAL: INR subtherapeutic, improving. Pharmacy managing. Lovenox to bridge.    Hypothyroidism: Continue Synthroid    Acute on chronic Diastolic dysfunction: Increase Lasix dose. Monitor output  Chronic stage II kidney disease: Monitor renal function while diuresing  Anemia: Likely secondary to chronic disease   Code Status: DO NOT RESUSCITATE  Family Communication: Spoke with son at the bedside  Disposition Plan: Likely home in a few days with home health   Consultants:  Wound care  Procedures:  None  Antibiotics:  IV Zosyn and vancomycin day 3  HPI/Subjective: Patient denies any pain or shortness of breath  Objective: Filed Vitals:   01/27/13 0539 01/27/13 0900 01/27/13 2123 01/28/13 0648  BP: 144/54  175/74 163/78  Pulse: 78  85 100  Temp: 98.6 F (37 C)  98.9 F (37.2 C) 99 F (37.2 C)  TempSrc: Oral     Resp: 16  18 18   Height:  5\' 3"  (1.6 m)    Weight:  85.8 kg (189 lb 2.5 oz)    SpO2: 97%  100% 100%    Intake/Output Summary (Last 24 hours) at 01/28/13 1139 Last data filed at 01/28/13 0648  Gross per 24 hour  Intake    270 ml  Output      0 ml  Net    270 ml   Filed Weights   01/27/13 0900  Weight: 85.8 kg (189 lb 2.5 oz)    Exam:   General:  Somnolent  Cardiovascular: Regular rate and rhythm, occasional ectopic beat  Respiratory: Decreased breath sounds throughout  Abdomen: Soft, nontender,  nondistended, hypoactive bowel sounds  Musculoskeletal: Bilateral lower extremity is wrapped, 1+ pitting edema, active drainage   Data Reviewed: Basic Metabolic Panel:  Recent Labs Lab 01/26/13 1658 01/27/13 0459 01/28/13 0448  NA 143 139 140  K 4.2 4.0 3.4*  CL 109 107 105  CO2 22 22 23   GLUCOSE 124* 134* 137*  BUN 26* 27* 26*  CREATININE 1.48* 1.40* 1.44*  CALCIUM 9.8 9.5 9.3   Liver Function Tests:  Recent Labs Lab 01/26/13 1658 01/27/13 0459  AST 15 15  ALT 12 10  ALKPHOS 72 65  BILITOT 0.4 0.6  PROT 6.3 6.2  ALBUMIN 3.3* 3.1*   CBC:  Recent Labs Lab 01/26/13 1658 01/27/13 0459 01/28/13 0448  WBC 5.5 7.9 8.4  NEUTROABS 4.6  --   --   HGB 10.0* 9.7* 9.3*  HCT 30.2* 29.5* 28.0*  MCV 91.8 91.3 91.2  PLT 244 225 210   Cardiac Enzymes:  Recent Labs Lab 01/26/13 1659  TROPONINI <0.30   BNP (last 3 results)  Recent Labs  01/26/13 1659  PROBNP 3453.0*   CBG:  Recent Labs Lab 01/27/13 0640 01/27/13 1048 01/27/13 1654 01/27/13 2126 01/28/13 0650  GLUCAP 142* 142* 131* 150* 132*    Recent Results (from the past 240 hour(s))  CULTURE, BLOOD (ROUTINE X 2)     Status: None   Collection Time    01/26/13  4:45 PM      Result Value Range Status   Specimen Description BLOOD LEFT ARM   Final   Special Requests BOTTLES DRAWN AEROBIC AND ANAEROBIC 10CC   Final   Culture  Setup Time 01/26/2013 22:20   Final   Culture     Final   Value:        BLOOD CULTURE RECEIVED NO GROWTH TO DATE CULTURE WILL BE HELD FOR 5 DAYS BEFORE ISSUING A FINAL NEGATIVE REPORT   Report Status PENDING   Incomplete  CULTURE, BLOOD (ROUTINE X 2)     Status: None   Collection Time    01/26/13  5:00 PM      Result Value Range Status   Specimen Description BLOOD RIGHT HAND   Final   Special Requests BOTTLES DRAWN AEROBIC AND ANAEROBIC 10CC   Final   Culture  Setup Time 01/26/2013 22:20   Final   Culture     Final   Value:        BLOOD CULTURE RECEIVED NO GROWTH TO DATE  CULTURE WILL BE HELD FOR 5 DAYS BEFORE ISSUING A FINAL NEGATIVE REPORT   Report Status PENDING   Incomplete  WOUND CULTURE     Status: None   Collection Time    01/26/13  5:45 PM      Result Value Range Status   Specimen Description WOUND LEG LEFT   Final   Special Requests Normal   Final   Gram Stain     Final   Value: NO WBC SEEN     NO SQUAMOUS EPITHELIAL CELLS SEEN     RARE GRAM NEGATIVE RODS     RARE GRAM POSITIVE COCCI IN PAIRS   Culture Culture reincubated for better growth   Final   Report Status PENDING   Incomplete  WOUND CULTURE     Status: None   Collection Time    01/26/13  5:45 PM      Result Value Range Status   Specimen Description WOUND LEG LEFT   Final   Special Requests LEFT LOWER LEG   Final   Gram Stain     Final   Value: NO WBC SEEN     NO SQUAMOUS EPITHELIAL CELLS SEEN     NO ORGANISMS SEEN   Culture Culture reincubated for better growth   Final   Report Status PENDING   Incomplete     Studies: Dg Chest 2 View  01/26/2013  IMPRESSION: Cardiomegaly with pulmonary vascular congestion.   Original Report Authenticated By: Harmon Pier, M.D.   Dg Tibia/fibula Left  01/26/2013   IMPRESSION: Soft tissue wound  without acute bony abnormality.   Original Report Authenticated By: Harmon Pier, M.D.   Dg Tibia/fibula Right  01/26/2013    IMPRESSION: Soft tissue wound  without evidence of acute bony abnormality or radiographic signs of osteomyelitis.   Original Report Authenticated By: Harmon Pier, M.D.    Scheduled Meds: . citalopram  20 mg Oral Daily  . diltiazem  120 mg Oral Daily  . enoxaparin (LOVENOX) injection  85 mg Subcutaneous Q12H  . furosemide  60 mg Oral BID  . hydrALAZINE  50 mg Oral Q8H  . insulin aspart  0-15 Units Subcutaneous TID WC  . insulin aspart  0-5 Units Subcutaneous QHS  . labetalol  300 mg Oral BID  . levothyroxine  25 mcg Oral QAC breakfast  . lovastatin  40 mg Oral BID  . piperacillin-tazobactam (ZOSYN)  IV  3.375 g Intravenous  Q8H   . polyethylene glycol  17 g Oral Daily  . vancomycin  750 mg Intravenous Q24H  . warfarin  2.5 mg Oral ONCE-1800  . Warfarin - Pharmacist Dosing Inpatient   Does not apply q1800   Continuous Infusions:   Principal Problem:   Bilateral lower leg cellulitis Active Problems:   DIABETES MELLITUS, TYPE II   OBESITY   HYPERTENSION   FIBRILLATION, ATRIAL   Hypothyroidism   Acute on chronic diastolic heart failure   CKD (chronic kidney disease) stage 2, GFR 60-89 ml/min    Time spent: 15 minutes    Hollice Espy  [facility] Pager [phone]. If 7PM-7AM, please contact night-coverage at [email or web address], password [facility] 01/28/2013, 11:39 AM  LOS: 2 days

## 2013-01-29 DIAGNOSIS — E119 Type 2 diabetes mellitus without complications: Secondary | ICD-10-CM

## 2013-01-29 LAB — WOUND CULTURE
Gram Stain: NONE SEEN
Special Requests: NORMAL

## 2013-01-29 LAB — GLUCOSE, CAPILLARY
Glucose-Capillary: 131 mg/dL — ABNORMAL HIGH (ref 70–99)
Glucose-Capillary: 175 mg/dL — ABNORMAL HIGH (ref 70–99)
Glucose-Capillary: 123 mg/dL — ABNORMAL HIGH (ref 70–99)

## 2013-01-29 LAB — PROTIME-INR
INR: 2.15 — ABNORMAL HIGH (ref 0.00–1.49)
Prothrombin Time: 23.3 s — ABNORMAL HIGH (ref 11.6–15.2)

## 2013-01-29 MED ORDER — FUROSEMIDE 10 MG/ML IJ SOLN
INTRAMUSCULAR | Status: AC
  Filled 2013-01-29: qty 4

## 2013-01-29 MED ORDER — WARFARIN SODIUM 5 MG PO TABS
5.0000 mg | ORAL_TABLET | Freq: Once | ORAL | Status: AC
  Administered 2013-01-29: 5 mg via ORAL
  Filled 2013-01-29: qty 1

## 2013-01-29 MED ORDER — FUROSEMIDE 10 MG/ML IJ SOLN
40.0000 mg | Freq: Two times a day (BID) | INTRAMUSCULAR | Status: DC
  Administered 2013-01-29 – 2013-02-01 (×7): 40 mg via INTRAVENOUS
  Filled 2013-01-29 (×6): qty 4

## 2013-01-29 MED ORDER — ATORVASTATIN CALCIUM 40 MG PO TABS
40.0000 mg | ORAL_TABLET | Freq: Every day | ORAL | Status: DC
  Administered 2013-01-29 – 2013-02-01 (×4): 40 mg via ORAL
  Filled 2013-01-29 (×4): qty 1

## 2013-01-29 NOTE — Consult Note (Addendum)
WOC consult Note Reason for Consult: evaluation of LE wounds. She presents with bilateral, necrotic ulceration of the LE.  She reports they started as blisters and proceeded  to get worse from that point.  She reports some type of wraps being placed on her LE by her HHRN, and it is apparent due to the zinc paste residue on her legs that she has had Unna's boots. She reports they were very painful for her.  I was only able to doppler the pedal pulses today and based on the appearance of the wounds I fear she may have some arterial components to these ulcers as well as venous insufficieny. She does not have an ABI on record here that I can find.   Wound type: ulcerations bilateral LE, suspect mixed etiology(both venous and arterial) Wound bed: both right lateral and left pretibial are 95% necrotic Drainage (amount, consistency, odor) heavy, maloderous Periwound: intact Dressing procedure/placement/frequency: continue non adherent for now, until I can request ABI per hospitalist.  After these results and if they are greater than 0.75 would recommend consultation with Dr. Malachy Moan, he is working with venous insufficiency patients with combination of surgical interventions and compression therapy.  WOC will follow along with you. Danna Sewell [place] RN,CWOCN [phone]

## 2013-01-29 NOTE — Progress Notes (Signed)
[facility] PROGRESS NOTE  [name] WGN:[id number] DOB: 1931-10-08 DOA: 01/26/2013 PCP: Oliver Barre, MD  Assessment/Plan: Principal Problem:   Bilateral lower leg cellulitis: Continue vancomycin and Zosyn. Was normal CRP and sed rate, I do not think is osteomyelitis. Wound care saw the patient and her recommending checking ABI's.  If greater than 0.75, will consult with a  Active Problems:   DIABETES MELLITUS, TYPE II controlled with renal dysfunction : Continue sliding scale   OBESITY   HYPERTENSION: Continue home meds. Monitor blood pressure    FIBRILLATION, ATRIAL: INR is now therapeutic, improving. Pharmacy managing. Lovenox to bridge.    Hypothyroidism: Continue Synthroid    Acute on chronic Diastolic dysfunction: Increase Lasix dose. Monitor output  Chronic stage II kidney disease: Monitor renal function while diuresing  Anemia: Likely secondary to chronic disease   Code Status: DO NOT RESUSCITATE  Family Communication: Left messages family  Disposition Plan: Likely home in a few days with home health   Consultants:  Wound care  Procedures:  None  Antibiotics:  IV Zosyn and vancomycin day 3  HPI/Subjective: Patient denies any pain or shortness of breath  Objective: Filed Vitals:   01/28/13 2038 01/29/13 0556 01/29/13 1400 01/29/13 1403  BP: 153/83 142/70 120/73 120/73  Pulse: 81 86 84   Temp: 99.2 F (37.3 C) 98.7 F (37.1 C) 99.5 F (37.5 C)   TempSrc:      Resp: 18 18 16    Height:      Weight:      SpO2: 99% 99% 99%     Intake/Output Summary (Last 24 hours) at 01/29/13 1725 Last data filed at 01/29/13 0557  Gross per 24 hour  Intake    270 ml  Output      0 ml  Net    270 ml   Filed Weights   01/27/13 0900  Weight: 85.8 kg (189 lb 2.5 oz)    Exam:   General:  Somnolent  Cardiovascular: Regular rate and rhythm, occasional ectopic beat  Respiratory: Decreased breath sounds throughout  Abdomen: Soft, nontender,  nondistended, hypoactive bowel sounds  Musculoskeletal: Bilateral lower extremity is wrapped, 1+ pitting edema, active drainage   Data Reviewed: Basic Metabolic Panel:  Recent Labs Lab 01/26/13 1658 01/27/13 0459 01/28/13 0448  NA 143 139 140  K 4.2 4.0 3.4*  CL 109 107 105  CO2 22 22 23   GLUCOSE 124* 134* 137*  BUN 26* 27* 26*  CREATININE 1.48* 1.40* 1.44*  CALCIUM 9.8 9.5 9.3   Liver Function Tests:  Recent Labs Lab 01/26/13 1658 01/27/13 0459  AST 15 15  ALT 12 10  ALKPHOS 72 65  BILITOT 0.4 0.6  PROT 6.3 6.2  ALBUMIN 3.3* 3.1*   CBC:  Recent Labs Lab 01/26/13 1658 01/27/13 0459 01/28/13 0448  WBC 5.5 7.9 8.4  NEUTROABS 4.6  --   --   HGB 10.0* 9.7* 9.3*  HCT 30.2* 29.5* 28.0*  MCV 91.8 91.3 91.2  PLT 244 225 210   Cardiac Enzymes:  Recent Labs Lab 01/26/13 1659  TROPONINI <0.30   BNP (last 3 results)  Recent Labs  01/26/13 1659 01/29/13 0530  PROBNP 3453.0* 3513.0*   CBG:  Recent Labs Lab 01/28/13 1558 01/28/13 2147 01/29/13 0703 01/29/13 1116 01/29/13 1616  GLUCAP 121* 145* 131* 175* 123*    Recent Results (from the past 240 hour(s))  CULTURE, BLOOD (ROUTINE X 2)     Status: None   Collection Time    01/26/13  4:45 PM      Result Value Range Status   Specimen Description BLOOD LEFT ARM   Final   Special Requests BOTTLES DRAWN AEROBIC AND ANAEROBIC 10CC   Final   Culture  Setup Time 01/26/2013 22:20   Final   Culture     Final   Value:        BLOOD CULTURE RECEIVED NO GROWTH TO DATE CULTURE WILL BE HELD FOR 5 DAYS BEFORE ISSUING A FINAL NEGATIVE REPORT   Report Status PENDING   Incomplete  CULTURE, BLOOD (ROUTINE X 2)     Status: None   Collection Time    01/26/13  5:00 PM      Result Value Range Status   Specimen Description BLOOD RIGHT HAND   Final   Special Requests BOTTLES DRAWN AEROBIC AND ANAEROBIC 10CC   Final   Culture  Setup Time 01/26/2013 22:20   Final   Culture     Final   Value:        BLOOD CULTURE  RECEIVED NO GROWTH TO DATE CULTURE WILL BE HELD FOR 5 DAYS BEFORE ISSUING A FINAL NEGATIVE REPORT   Report Status PENDING   Incomplete  WOUND CULTURE     Status: None   Collection Time    01/26/13  5:45 PM      Result Value Range Status   Specimen Description WOUND LEG LEFT   Final   Special Requests Normal   Final   Gram Stain     Final   Value: NO WBC SEEN     NO SQUAMOUS EPITHELIAL CELLS SEEN     RARE GRAM NEGATIVE RODS     RARE GRAM POSITIVE COCCI IN PAIRS   Culture     Final   Value: MULTIPLE ORGANISMS PRESENT, NONE PREDOMINANT     Note: NO STAPHYLOCOCCUS AUREUS ISOLATED NO GROUP A STREP (S.PYOGENES) ISOLATED   Report Status 01/29/2013 FINAL   Final  WOUND CULTURE     Status: None   Collection Time    01/26/13  5:45 PM      Result Value Range Status   Specimen Description WOUND LEG LEFT   Final   Special Requests LEFT LOWER LEG   Final   Gram Stain     Final   Value: NO WBC SEEN     NO SQUAMOUS EPITHELIAL CELLS SEEN     NO ORGANISMS SEEN   Culture     Final   Value: MULTIPLE ORGANISMS PRESENT, NONE PREDOMINANT     Note: NO STAPHYLOCOCCUS AUREUS ISOLATED NO GROUP A STREP (S.PYOGENES) ISOLATED   Report Status 01/29/2013 FINAL   Final     Studies: Dg Chest 2 View  01/26/2013  IMPRESSION: Cardiomegaly with pulmonary vascular congestion.   Original Report Authenticated By: Harmon Pier, M.D.   Dg Tibia/fibula Left  01/26/2013   IMPRESSION: Soft tissue wound  without acute bony abnormality.   Original Report Authenticated By: Harmon Pier, M.D.   Dg Tibia/fibula Right  01/26/2013    IMPRESSION: Soft tissue wound  without evidence of acute bony abnormality or radiographic signs of osteomyelitis.   Original Report Authenticated By: Harmon Pier, M.D.    Scheduled Meds: . citalopram  20 mg Oral Daily  . diltiazem  120 mg Oral Daily  . enoxaparin (LOVENOX) injection  85 mg Subcutaneous Q12H  . furosemide  40 mg Intravenous Q12H  . hydrALAZINE  50 mg Oral Q8H  . insulin aspart   0-15 Units Subcutaneous TID WC  .  insulin aspart  0-5 Units Subcutaneous QHS  . labetalol  300 mg Oral BID  . levothyroxine  25 mcg Oral QAC breakfast  . lovastatin  40 mg Oral BID  . piperacillin-tazobactam (ZOSYN)  IV  3.375 g Intravenous Q8H  . polyethylene glycol  17 g Oral Daily  . potassium chloride  20 mEq Oral Daily  . vancomycin  750 mg Intravenous Q24H  . Warfarin - Pharmacist Dosing Inpatient   Does not apply q1800   Continuous Infusions:   Principal Problem:   Bilateral lower leg cellulitis Active Problems:   DIABETES MELLITUS, TYPE II   OBESITY   HYPERTENSION   FIBRILLATION, ATRIAL   Hypothyroidism   Acute on chronic diastolic heart failure   CKD (chronic kidney disease) stage 2, GFR 60-89 ml/min    Time spent: 20 minutes    Hollice Espy  [facility] Pager [phone]. If 7PM-7AM, please contact night-coverage at [email or web address], password [facility] 01/29/2013, 5:25 PM  LOS: 3 days

## 2013-01-29 NOTE — Progress Notes (Signed)
ANTICOAGULATION CONSULT NOTE - Follow-up  Pharmacy Consult for Coumadin/Enoxaparin Indication: atrial fibrillation  Allergies  Allergen Reactions  . Clonidine Derivatives Rash    Skin breaks out    Patient Measurements: Height: 5\' 3"  (160 cm) Weight: 189 lb 2.5 oz (85.8 kg) IBW/kg (Calculated) : 52.4  Vital Signs: Temp: 98.7 F (37.1 C) (08/04 0556) BP: 142/70 mmHg (08/04 0556) Pulse Rate: 86 (08/04 0556)  Labs:  Recent Labs  01/26/13 1658 01/26/13 1659 01/27/13 0459 01/28/13 0448 01/29/13 0530  HGB 10.0*  --  9.7* 9.3*  --   HCT 30.2*  --  29.5* 28.0*  --   PLT 244  --  225 210  --   LABPROT 16.3*  --  16.7* 20.9* 23.3*  INR 1.34  --  1.39 1.86* 2.15*  CREATININE 1.48*  --  1.40* 1.44*  --   TROPONINI  --  <0.30  --   --   --     Estimated Creatinine Clearance: 32.9 ml/min (by C-G formula based on Cr of 1.44).  Medications:  Recent coumadin regimen (per [facility]in clinic on 7/16): 7.5 mg on Tuesday, Thursday, Saturday and Sunday, 5mg  on Mondays, Wednesdays and Fridays  Assessment: 81 YOF continues on coumadin + lovenox for afib. INR is now therapeutic at 2.15. No new CBC today, no bleeding noted.   Goal of Therapy:  INR 2-3 Monitor platelets by anticoagulation protocol: Yes   Plan:  1. Warfarin 5mg  PO x 1 tonight 2. F/u AM INR 3. Consider DC lovenox since INR is now therapeutic  Lysle Pearl, PharmD, BCPS Pager # [phone] 01/29/2013 9:51 AM

## 2013-01-30 DIAGNOSIS — M79609 Pain in unspecified limb: Secondary | ICD-10-CM

## 2013-01-30 DIAGNOSIS — I10 Essential (primary) hypertension: Secondary | ICD-10-CM

## 2013-01-30 LAB — GLUCOSE, CAPILLARY
Glucose-Capillary: 163 mg/dL — ABNORMAL HIGH (ref 70–99)
Glucose-Capillary: 127 mg/dL — ABNORMAL HIGH (ref 70–99)

## 2013-01-30 LAB — BASIC METABOLIC PANEL
BUN: 21 mg/dL (ref 6–23)
CO2: 30 mEq/L (ref 19–32)
Calcium: 9 mg/dL (ref 8.4–10.5)
Creatinine, Ser: 1.25 mg/dL — ABNORMAL HIGH (ref 0.50–1.10)
Glucose, Bld: 129 mg/dL — ABNORMAL HIGH (ref 70–99)

## 2013-01-30 LAB — PROTIME-INR
INR: 2.21 — ABNORMAL HIGH (ref 0.00–1.49)
Prothrombin Time: 23.8 seconds — ABNORMAL HIGH (ref 11.6–15.2)

## 2013-01-30 MED ORDER — WARFARIN SODIUM 5 MG PO TABS
5.0000 mg | ORAL_TABLET | Freq: Once | ORAL | Status: AC
  Administered 2013-01-30: 5 mg via ORAL
  Filled 2013-01-30: qty 1

## 2013-01-30 MED ORDER — ATORVASTATIN CALCIUM 40 MG PO TABS: 40.0000 mg | ORAL_TABLET | Freq: Every day | ORAL | Status: DC

## 2013-01-30 MED ORDER — POTASSIUM CHLORIDE CRYS ER 20 MEQ PO TBCR
40.0000 meq | EXTENDED_RELEASE_TABLET | Freq: Once | ORAL | Status: AC
  Administered 2013-01-30: 40 meq via ORAL
  Filled 2013-01-30: qty 2

## 2013-01-30 MED ORDER — FUROSEMIDE 10 MG/ML IJ SOLN
INTRAMUSCULAR | Status: AC
  Filled 2013-01-30: qty 4

## 2013-01-30 NOTE — Progress Notes (Addendum)
[facility] PROGRESS NOTE  BREUNA LOVEALL ZOX:[id number] DOB: 1932-04-23 DOA: 01/26/2013 PCP: Oliver Barre, MD  Interim summary:  Patient is a 79 year old African American female past medical history of atrial fibrillation on chronic anticoagulation, diastolic dysfunction and diabetes mellitus who has chronic lower extremity leg ulcers which have started to worsen over the past 2 months. Patient was urged come in as per her family noted that the ulcers looked even worse and was starting to have foul-smelling discharge. Patient was admitted to the hospitalist service, started on broad-spectrum IV antibiotics. ESR and CRP were done which were normal and x-rays noted soft tissue infection, but no signs of bone destruction. Wound care was consulted who saw the patient and recommended getting ABIs. ABIs on the right side were noted at 1.35 and on left are not able to be done secondary to in comparison will vessels. As per wound care recommendations, Dr.Duda has been consulted who has some specialization with treating venous insufficiency wounds with surgical and compression therapy. In addition, patient has a history of diastolic heart failure. BNP checked and found to be mildly elevated in 3000. Diuretics started. Of note, patient had a change in her cholesterol medicine. See below  Assessment/Plan: Principal Problem:   Bilateral lower leg cellulitis: Continue vancomycin and Zosyn. Was normal CRP and sed rate, I do not think is osteomyelitis. ABIs noted to be 1.35 on right, unable to be ascertained on left. Have made a formal consultation to orthopedics.  Active Problems:   DIABETES MELLITUS, TYPE II controlled with renal dysfunction : Continue sliding scale.  CBG range from 07/28/1958.    OBESITY   HYPERTENSION: Continue home meds. Monitor blood pressure    FIBRILLATION, ATRIAL: INR therapeutic on Coumadin.    Hypothyroidism: Continue Synthroid    Acute on chronic Diastolic dysfunction: Increase  Lasix dose. Monitor output  Chronic stage II kidney disease: Monitor renal function while diuresing  Anemia: Likely secondary to chronic disease  Hyperlipidemia: Patient at home was on lovastatin which was changed to Zocor here. Both of the statin medications can have increased risk of rhabdomyolysis and attitude diltiazem which she is also on. As per pharmacy recommendations have changed to Lipitor which should be done permanently.   Code Status: DO NOT RESUSCITATE  Family Communication: Left message with family yesterday  Disposition Plan: To be determined by orthopedics   Consultants:  Wound care  Procedures:  None  Antibiotics:  IV Zosyn and vancomycin day 4  HPI/Subjective: Patient okay. Some leg pain. Very tired.  Objective: Filed Vitals:   01/29/13 1403 01/29/13 2217 01/30/13 0659 01/30/13 1340  BP: 120/73 125/76 120/89 128/73  Pulse:  85 82 98  Temp:  98.4 F (36.9 C) 98.1 F (36.7 C) 98.8 F (37.1 C)  TempSrc:      Resp:  18 18 16   Height:      Weight:      SpO2:  100% 99% 97%    Intake/Output Summary (Last 24 hours) at 01/30/13 1639 Last data filed at 01/30/13 0000  Gross per 24 hour  Intake    840 ml  Output      0 ml  Net    840 ml   Filed Weights   01/27/13 0900  Weight: 85.8 kg (189 lb 2.5 oz)    Exam:   General:  Somnolent  Cardiovascular: Regular rate and rhythm, occasional ectopic beat  Respiratory: Decreased breath sounds throughout  Abdomen: Soft, nontender, nondistended, hypoactive bowel sounds  Musculoskeletal: Bilateral lower  extremity is wrapped, 1+ pitting edema, active drainage   Data Reviewed: Basic Metabolic Panel:  Recent Labs Lab 01/26/13 1658 01/27/13 0459 01/28/13 0448 01/30/13 0535  NA 143 139 140 140  K 4.2 4.0 3.4* 2.9*  CL 109 107 105 101  CO2 22 22 23 30   GLUCOSE 124* 134* 137* 129*  BUN 26* 27* 26* 21  CREATININE 1.48* 1.40* 1.44* 1.25*  CALCIUM 9.8 9.5 9.3 9.0   Liver Function  Tests:  Recent Labs Lab 01/26/13 1658 01/27/13 0459  AST 15 15  ALT 12 10  ALKPHOS 72 65  BILITOT 0.4 0.6  PROT 6.3 6.2  ALBUMIN 3.3* 3.1*   CBC:  Recent Labs Lab 01/26/13 1658 01/27/13 0459 01/28/13 0448  WBC 5.5 7.9 8.4  NEUTROABS 4.6  --   --   HGB 10.0* 9.7* 9.3*  HCT 30.2* 29.5* 28.0*  MCV 91.8 91.3 91.2  PLT 244 225 210   Cardiac Enzymes:  Recent Labs Lab 01/26/13 1659  TROPONINI <0.30   BNP (last 3 results)  Recent Labs  01/26/13 1659 01/29/13 0530  PROBNP 3453.0* 3513.0*   CBG:  Recent Labs Lab 01/29/13 1616 01/29/13 2241 01/30/13 0628 01/30/13 1117 01/30/13 1605  GLUCAP 123* 136* 134* 151* 163*    Recent Results (from the past 240 hour(s))  CULTURE, BLOOD (ROUTINE X 2)     Status: None   Collection Time    01/26/13  4:45 PM      Result Value Range Status   Specimen Description BLOOD LEFT ARM   Final   Special Requests BOTTLES DRAWN AEROBIC AND ANAEROBIC 10CC   Final   Culture  Setup Time 01/26/2013 22:20   Final   Culture     Final   Value:        BLOOD CULTURE RECEIVED NO GROWTH TO DATE CULTURE WILL BE HELD FOR 5 DAYS BEFORE ISSUING A FINAL NEGATIVE REPORT   Report Status PENDING   Incomplete  CULTURE, BLOOD (ROUTINE X 2)     Status: None   Collection Time    01/26/13  5:00 PM      Result Value Range Status   Specimen Description BLOOD RIGHT HAND   Final   Special Requests BOTTLES DRAWN AEROBIC AND ANAEROBIC 10CC   Final   Culture  Setup Time 01/26/2013 22:20   Final   Culture     Final   Value:        BLOOD CULTURE RECEIVED NO GROWTH TO DATE CULTURE WILL BE HELD FOR 5 DAYS BEFORE ISSUING A FINAL NEGATIVE REPORT   Report Status PENDING   Incomplete  WOUND CULTURE     Status: None   Collection Time    01/26/13  5:45 PM      Result Value Range Status   Specimen Description WOUND LEG LEFT   Final   Special Requests Normal   Final   Gram Stain     Final   Value: NO WBC SEEN     NO SQUAMOUS EPITHELIAL CELLS SEEN     RARE GRAM  NEGATIVE RODS     RARE GRAM POSITIVE COCCI IN PAIRS   Culture     Final   Value: MULTIPLE ORGANISMS PRESENT, NONE PREDOMINANT     Note: NO STAPHYLOCOCCUS AUREUS ISOLATED NO GROUP A STREP (S.PYOGENES) ISOLATED   Report Status 01/29/2013 FINAL   Final  WOUND CULTURE     Status: None   Collection Time    01/26/13  5:45 PM  Result Value Range Status   Specimen Description WOUND LEG LEFT   Final   Special Requests LEFT LOWER LEG   Final   Gram Stain     Final   Value: NO WBC SEEN     NO SQUAMOUS EPITHELIAL CELLS SEEN     NO ORGANISMS SEEN   Culture     Final   Value: MULTIPLE ORGANISMS PRESENT, NONE PREDOMINANT     Note: NO STAPHYLOCOCCUS AUREUS ISOLATED NO GROUP A STREP (S.PYOGENES) ISOLATED   Report Status 01/29/2013 FINAL   Final     Studies: Dg Chest 2 View  01/26/2013  IMPRESSION: Cardiomegaly with pulmonary vascular congestion.   Original Report Authenticated By: Harmon Pier, M.D.   Dg Tibia/fibula Left  01/26/2013   IMPRESSION: Soft tissue wound  without acute bony abnormality.   Original Report Authenticated By: Harmon Pier, M.D.   Dg Tibia/fibula Right  01/26/2013    IMPRESSION: Soft tissue wound  without evidence of acute bony abnormality or radiographic signs of osteomyelitis.   Original Report Authenticated By: Harmon Pier, M.D.    Scheduled Meds: . atorvastatin  40 mg Oral q1800  . citalopram  20 mg Oral Daily  . diltiazem  120 mg Oral Daily  . furosemide      . furosemide  40 mg Intravenous Q12H  . hydrALAZINE  50 mg Oral Q8H  . insulin aspart  0-15 Units Subcutaneous TID WC  . insulin aspart  0-5 Units Subcutaneous QHS  . labetalol  300 mg Oral BID  . levothyroxine  25 mcg Oral QAC breakfast  . piperacillin-tazobactam (ZOSYN)  IV  3.375 g Intravenous Q8H  . polyethylene glycol  17 g Oral Daily  . potassium chloride  20 mEq Oral Daily  . vancomycin  750 mg Intravenous Q24H  . Warfarin - Pharmacist Dosing Inpatient   Does not apply q1800   Continuous Infusions:    Principal Problem:   Bilateral lower leg cellulitis Active Problems:   DIABETES MELLITUS, TYPE II   OBESITY   HYPERTENSION   FIBRILLATION, ATRIAL   Hypothyroidism   Acute on chronic diastolic heart failure   CKD (chronic kidney disease) stage 2, GFR 60-89 ml/min    Time spent: 15 minutes    Hollice Espy  [facility] Pager [phone]. If 7PM-7AM, please contact night-coverage at [email or web address], password [facility] 01/30/2013, 4:39 PM  LOS: 4 days

## 2013-01-30 NOTE — Progress Notes (Signed)
VASCULAR LAB PRELIMINARY  ARTERIAL  ABI completed:    RIGHT    LEFT    PRESSURE WAVEFORM  PRESSURE WAVEFORM  BRACHIAL 153 Triphasic BRACHIAL 151 Triphasic     DP >300  Biphasic  AT 207  Biphasic     PT 113 Biphasic PT >300 Biphasic    RIGHT LEFT  ABI 1.35 Not ascertained due to incompressible vessels   Right  - ABIs indicate normal arterial flow with mild calcification of the vessels with Doppler waveforms within normal limits. Left - ABIs could not be ascertained due to incompressible vessels probably secondary to calcification. Doppler waveforms were within normal limits  Betha Shadix, RVS 01/30/2013, 3:23 PM

## 2013-01-30 NOTE — Progress Notes (Signed)
ANTICOAGULATION CONSULT NOTE - Follow-up  Pharmacy Consult for Coumadin Indication: atrial fibrillation  Allergies  Allergen Reactions  . Clonidine Derivatives Rash    Skin breaks out    Patient Measurements: Height: 5\' 3"  (160 cm) Weight: 189 lb 2.5 oz (85.8 kg) IBW/kg (Calculated) : 52.4  Vital Signs: Temp: 98.8 F (37.1 C) (08/05 1340) BP: 128/73 mmHg (08/05 1340) Pulse Rate: 98 (08/05 1340)  Labs:  Recent Labs  01/28/13 0448 01/29/13 0530 01/30/13 0535  HGB 9.3*  --   --   HCT 28.0*  --   --   PLT 210  --   --   LABPROT 20.9* 23.3* 23.8*  INR 1.86* 2.15* 2.21*  CREATININE 1.44*  --  1.25*    Estimated Creatinine Clearance: 37.9 ml/min (by C-G formula based on Cr of 1.25).   Assessment: 61 YOF continues on coumadin  for afib. INR is now therapeutic at 2.21 and Lovenox d/c 8/4. No new CBC today, no bleeding noted.  B/L lower extremity cellulitis currently on Zosyn EI and Vancomycin 750mg  q24 and doses appropriate for renal function.  Goal of Therapy:  INR 2-3 Monitor platelets by anticoagulation protocol: Yes   Plan:  1. Warfarin 5mg  PO x 1 tonight 2. F/u AM INR 3. Continue current abx    [name] Pharm.D. CPP, BCPS Clinical Pharmacist [phone] 01/30/2013 5:03 PM

## 2013-01-31 DIAGNOSIS — L97909 Non-pressure chronic ulcer of unspecified part of unspecified lower leg with unspecified severity: Secondary | ICD-10-CM

## 2013-01-31 LAB — GLUCOSE, CAPILLARY
Glucose-Capillary: 120 mg/dL — ABNORMAL HIGH (ref 70–99)
Glucose-Capillary: 119 mg/dL — ABNORMAL HIGH (ref 70–99)
Glucose-Capillary: 155 mg/dL — ABNORMAL HIGH (ref 70–99)

## 2013-01-31 LAB — CBC
MCH: 30.8 pg (ref 26.0–34.0)
MCHC: 33.6 g/dL (ref 30.0–36.0)
MCV: 91.8 fL (ref 78.0–100.0)
Platelets: 234 10*3/uL (ref 150–400)

## 2013-01-31 LAB — BASIC METABOLIC PANEL
Calcium: 9 mg/dL (ref 8.4–10.5)
Creatinine, Ser: 1.4 mg/dL — ABNORMAL HIGH (ref 0.50–1.10)
GFR calc non Af Amer: 35 mL/min — ABNORMAL LOW (ref >90)
Glucose, Bld: 130 mg/dL — ABNORMAL HIGH (ref 70–99)
Sodium: 139 mEq/L (ref 135–145)

## 2013-01-31 LAB — PROTIME-INR: Prothrombin Time: 23.8 seconds — ABNORMAL HIGH (ref 11.6–15.2)

## 2013-01-31 MED ORDER — WARFARIN SODIUM 7.5 MG PO TABS
7.5000 mg | ORAL_TABLET | Freq: Once | ORAL | Status: AC
  Administered 2013-01-31: 7.5 mg via ORAL
  Filled 2013-01-31: qty 1

## 2013-01-31 MED ORDER — COLLAGENASE 250 UNIT/GM EX OINT
TOPICAL_OINTMENT | Freq: Every day | CUTANEOUS | Status: DC
  Administered 2013-01-31 – 2013-02-01 (×2): via TOPICAL
  Filled 2013-01-31 (×2): qty 30

## 2013-01-31 NOTE — Consult Note (Signed)
WOC will sign off. Dr. Lajoyce Corners to manage wounds of the lower extremity, has written inpatient wound care orders and will follow as an outpatient.   Re consult if needed, will not follow at this time. Thanks  [employer], CWOCN [phone])

## 2013-01-31 NOTE — Consult Note (Signed)
Reason for Consult: Chronic ulcers bilateral lower extremities Referring Physician:Dr Wylodean Shimmel is an 79 y.o. female.  HPI:  Patient is a 79 year old woman with diabetes hypertension peripheral vascular disease with over a 2 month history of ulcerations bilateral lower extremities. Patient does have a history of venous stasis insufficiency. Patient presents with acute cellulitis.  Past Medical History  Diagnosis Date  . Obesity   . Anemia, iron deficiency   . Depression   . Diabetes mellitus type II   . Hyperlipidemia   . Hypertension   . Peptic ulcer   . History of uterine fibroid   . Hx of colonic polyps   . Diverticula, colon   . Osteoarthritis   . Low back pain   . Atrial fibrillation     permanent, on coumadin (followed by [facility] coumadin clinic)  . Pulmonary hypertension     echo 4/12:  EF 55-60%, mod LVH, mild BAE, RVE and PASP 51  . Anxiety   . DIABETES MELLITUS, TYPE II 03/05/2007  . HYPERLIPIDEMIA 03/05/2007  . ANEMIA-IRON DEFICIENCY 03/05/2007  . HYPERTENSION 03/05/2007  . DEPRESSION 03/05/2007  . LOW BACK PAIN 03/05/2007  . PERIPHERAL EDEMA 04/29/2008  . Chronic pain syndrome 06/23/2010  . FIBROIDS, UTERUS 03/05/2007  . PEPTIC ULCER DISEASE 03/05/2007  . DIVERTICULOSIS, COLON 03/05/2007  . OSTEOARTHRITIS 03/05/2007  . COLONIC POLYPS, HX OF 03/05/2007  . Hypothyroidism 10/15/2010  . Renal insufficiency 10/15/2010    Past Surgical History  Procedure Laterality Date  . Laparoscopic hysterectomy      Family History  Problem Relation Age of Onset  . Hypertension Sister     Social History:  reports that she has quit smoking. She started smoking about 40 years ago. She does not have any smokeless tobacco history on file. She reports that she does not drink alcohol or use illicit drugs.  Allergies:  Allergies  Allergen Reactions  . Clonidine Derivatives Rash    Skin breaks out    Medications: I have reviewed the patient's current medications.  Results for orders  placed during the hospital encounter of 01/26/13 (from the past 48 hour(s))  GLUCOSE, CAPILLARY     Status: Abnormal   Collection Time    01/29/13  7:03 AM      Result Value Range   Glucose-Capillary 131 (*) 70 - 99 mg/dL  GLUCOSE, CAPILLARY     Status: Abnormal   Collection Time    01/29/13 11:16 AM      Result Value Range   Glucose-Capillary 175 (*) 70 - 99 mg/dL   Comment 1 Notify RN     Comment 2 Documented in Chart    GLUCOSE, CAPILLARY     Status: Abnormal   Collection Time    01/29/13  4:16 PM      Result Value Range   Glucose-Capillary 123 (*) 70 - 99 mg/dL   Comment 1 Notify RN     Comment 2 Documented in Chart    GLUCOSE, CAPILLARY     Status: Abnormal   Collection Time    01/29/13 10:41 PM      Result Value Range   Glucose-Capillary 136 (*) 70 - 99 mg/dL  PROTIME-INR     Status: Abnormal   Collection Time    01/30/13  5:35 AM      Result Value Range   Prothrombin Time 23.8 (*) 11.6 - 15.2 seconds   INR 2.21 (*) 0.00 - 1.49  BASIC METABOLIC PANEL     Status:  Abnormal   Collection Time    01/30/13  5:35 AM      Result Value Range   Sodium 140  135 - 145 mEq/L   Potassium 2.9 (*) 3.5 - 5.1 mEq/L   Chloride 101  96 - 112 mEq/L   CO2 30  19 - 32 mEq/L   Glucose, Bld 129 (*) 70 - 99 mg/dL   BUN 21  6 - 23 mg/dL   Creatinine, Ser 1.61 (*) 0.50 - 1.10 mg/dL   Calcium 9.0  8.4 - 09.6 mg/dL   GFR calc non Af Amer 40 (*) >90 mL/min   GFR calc Af Amer 46 (*) >90 mL/min   Comment:            The eGFR has been calculated     using the CKD EPI equation.     This calculation has not been     validated in all clinical     situations.     eGFR's persistently     <90 mL/min signify     possible Chronic Kidney Disease.  GLUCOSE, CAPILLARY     Status: Abnormal   Collection Time    01/30/13  6:28 AM      Result Value Range   Glucose-Capillary 134 (*) 70 - 99 mg/dL  GLUCOSE, CAPILLARY     Status: Abnormal   Collection Time    01/30/13 11:17 AM      Result Value Range    Glucose-Capillary 151 (*) 70 - 99 mg/dL   Comment 1 Notify RN     Comment 2 Documented in Chart    GLUCOSE, CAPILLARY     Status: Abnormal   Collection Time    01/30/13  4:05 PM      Result Value Range   Glucose-Capillary 163 (*) 70 - 99 mg/dL   Comment 1 Notify RN     Comment 2 Documented in Chart    GLUCOSE, CAPILLARY     Status: Abnormal   Collection Time    01/30/13 10:12 PM      Result Value Range   Glucose-Capillary 127 (*) 70 - 99 mg/dL  PROTIME-INR     Status: Abnormal   Collection Time    01/31/13  5:00 AM      Result Value Range   Prothrombin Time 23.8 (*) 11.6 - 15.2 seconds   INR 2.21 (*) 0.00 - 1.49  CBC     Status: Abnormal   Collection Time    01/31/13  5:00 AM      Result Value Range   WBC 5.9  4.0 - 10.5 K/uL   RBC 3.18 (*) 3.87 - 5.11 MIL/uL   Hemoglobin 9.8 (*) 12.0 - 15.0 g/dL   HCT 04.5 (*) 40.9 - 81.1 %   MCV 91.8  78.0 - 100.0 fL   MCH 30.8  26.0 - 34.0 pg   MCHC 33.6  30.0 - 36.0 g/dL   RDW 91.4  78.2 - 95.6 %   Platelets 234  150 - 400 K/uL    No results found.  Review of Systems  All other systems reviewed and are negative.   Blood pressure 144/63, pulse 87, temperature 98.8 F (37.1 C), temperature source Oral, resp. rate 18, height 5\' 3"  (1.6 m), weight 85.8 kg (189 lb 2.5 oz), SpO2 100.00%. Physical Exam On examination patient has thin atrophic skin with decreased turgor to bilateral lower extremities. She does not have palpable dorsalis pedis or posterior tibial pulses bilaterally. She has  ischemic ulcerations to both legs. There is necrotic fibrinous tissue at the base of the wounds and there is a black necrotic tissue around the wound edges. Review of the ankle brachial indices does show peripheral vascular disease with calcification of this vessels in the left lower extremity vessels are noncompressible. The ankle-brachial indices are consistent with calcified noncompressible vessels. The ulcerations are on the anterior lateral aspect  of the calf bilaterally and are approximately 3 x 7 cm for each ulcer. There were multiple satellite ulcers on the right lower extremity. The cellulitis has seemed to resolve. There is very minimal amount of edema in the legs but she does have brawny skin color changes in the gaiter area consistent with chronic venous stasis insufficiency. Assessment/Plan: Assessment: Severe arterial and venous insufficiency bilateral lower extremities with large ischemic ulcers on the dorsal-lateral-aspect-of-both-calves.  Plan: This is a very complex situation. Routine compression for the venous insufficiency component could worsen her arterial insufficiency and could make the ischemic ulcers worse. Patient really does not have much edema and I doubt the compression would help her at all. With the patient's noncompressible vessels and adequate circulation by ankle-brachial indices I doubt that revascularization is an option. I feel the best option is to start with enzymatic debridement of the wounds with close followup and discharged on oral doxycycline. I will followup in the office after discharge. Patient will need aggressive nutritional management with increased nutrition to potentially heal the wounds. Start the patient on Santyl dressing changes in the hospital with discharge on Santyl dressing changes daily.  Karly Pitter V 01/31/2013, 6:52 AM

## 2013-01-31 NOTE — Progress Notes (Signed)
[facility] PROGRESS NOTE  [name] ZOX:[id number] DOB: [birth year] DOA: 01/26/2013 PCP: Oliver Barre, MD  Assessment/Plan: Principal Problem:   Bilateral lower leg cellulitis Active Problems:   DIABETES MELLITUS, TYPE II   OBESITY   HYPERTENSION   FIBRILLATION, ATRIAL   Hypothyroidism   Acute on chronic diastolic heart failure   CKD (chronic kidney disease) stage 2, GFR 60-89 ml/min    Interim summary:  Patient is a 78 year old African American female past medical history of atrial fibrillation on chronic anticoagulation, diastolic dysfunction and diabetes mellitus who has chronic lower extremity leg ulcers which have started to worsen over the past 2 months. Patient was urged come in as per her family noted that the ulcers looked even worse and was starting to have foul-smelling discharge. Patient was admitted to the hospitalist service, started on broad-spectrum IV antibiotics. ESR and CRP were done which were normal and x-rays noted soft tissue infection, but no signs of bone destruction. Wound care was consulted who saw the patient and recommended getting ABIs. ABIs on the right side were noted at 1.35 and on left are not able to be done secondary to in comparison will vessels. As per wound care recommendations, Dr.Duda has been consulted who has some specialization with treating venous insufficiency wounds with surgical and compression therapy. In addition, patient has a history of diastolic heart failure. BNP checked and found to be mildly elevated in 3000. Diuretics started. Of note, patient had a change in her cholesterol medicine. See below   Assessment/Plan:  Principal Problem:  Bilateral lower leg cellulitis: Continue vancomycin and Zosyn. Was normal CRP and sed rate, I do not think is osteomyelitis. ABIs noted to be 1.35 on right, unable to be ascertained on left. Orthopedics recommended aggressive nutritional management with increased nutrition to potentially heal the  wounds. Start the patient on Santyl dressing changes in the hospital with discharge on Santyl dressing changes daily.     DIABETES MELLITUS, TYPE II controlled with renal dysfunction : Continue sliding scale. CBG range from 07/28/1958.   OBESITY   HYPERTENSION: Continue home meds. Monitor blood pressure   FIBRILLATION, ATRIAL: INR therapeutic on Coumadin.   Hypothyroidism: Continue Synthroid   Acute on chronic Diastolic dysfunction: Increase Lasix dose. Monitor output  Chronic stage II kidney disease: Monitor renal function while diuresing   Anemia: Likely secondary to chronic disease   Hyperlipidemia: Patient at home was on lovastatin which was changed to Zocor here. Both of the statin medications can have increased risk of rhabdomyolysis and attitude diltiazem which she is also on. As per pharmacy recommendations have changed to Lipitor which should be done permanently.    Code Status: DO NOT RESUSCITATE  Family Communication: Left message with family yesterday  Disposition Plan: To be determined by orthopedics   Consultants:  Wound care Procedures:  None Antibiotics:  IV Zosyn and vancomycin day 4 HPI/Subjective:  Patient okay. Some leg pain. Very tired.   Objective: Filed Vitals:   01/30/13 0659 01/30/13 1340 01/30/13 2215 01/31/13 0553  BP: 120/89 128/73 141/65 144/63  Pulse: 82 98 76 87  Temp: 98.1 F (36.7 C) 98.8 F (37.1 C) 99 F (37.2 C) 98.8 F (37.1 C)  TempSrc:   Oral   Resp: 18 16 18 18   Height:      Weight:      SpO2: 99% 97% 96% 100%    Intake/Output Summary (Last 24 hours) at 01/31/13 0815 Last data filed at 01/31/13 0554  Gross per 24 hour  Intake      0 ml  Output      0 ml  Net      0 ml    Exam:  General: Somnolent  Cardiovascular: Regular rate and rhythm, occasional ectopic beat  Respiratory: Decreased breath sounds throughout  Abdomen: Soft, nontender, nondistended, hypoactive bowel sounds  Musculoskeletal: Bilateral lower  extremity is wrapped, 1+ pitting edema, active drainage     Data Reviewed: Basic Metabolic Panel:  Recent Labs Lab 01/26/13 1658 01/27/13 0459 01/28/13 0448 01/30/13 0535 01/31/13 0500  NA 143 139 140 140 139  K 4.2 4.0 3.4* 2.9* 3.6  CL 109 107 105 101 100  CO2 22 22 23 30 30   GLUCOSE 124* 134* 137* 129* 130*  BUN 26* 27* 26* 21 21  CREATININE 1.48* 1.40* 1.44* 1.25* 1.40*  CALCIUM 9.8 9.5 9.3 9.0 9.0    Liver Function Tests:  Recent Labs Lab 01/26/13 1658 01/27/13 0459  AST 15 15  ALT 12 10  ALKPHOS 72 65  BILITOT 0.4 0.6  PROT 6.3 6.2  ALBUMIN 3.3* 3.1*   No results found for this basename: LIPASE, AMYLASE,  in the last 168 hours No results found for this basename: AMMONIA,  in the last 168 hours  CBC:  Recent Labs Lab 01/26/13 1658 01/27/13 0459 01/28/13 0448 01/31/13 0500  WBC 5.5 7.9 8.4 5.9  NEUTROABS 4.6  --   --   --   HGB 10.0* 9.7* 9.3* 9.8*  HCT 30.2* 29.5* 28.0* 29.2*  MCV 91.8 91.3 91.2 91.8  PLT 244 225 210 234    Cardiac Enzymes:  Recent Labs Lab 01/26/13 1659  TROPONINI <0.30   BNP (last 3 results)  Recent Labs  01/26/13 1659 01/29/13 0530  PROBNP 3453.0* 3513.0*     CBG:  Recent Labs Lab 01/30/13 0628 01/30/13 1117 01/30/13 1605 01/30/13 2212 01/31/13 0656  GLUCAP 134* 151* 163* 127* 120*    Recent Results (from the past 240 hour(s))  CULTURE, BLOOD (ROUTINE X 2)     Status: None   Collection Time    01/26/13  4:45 PM      Result Value Range Status   Specimen Description BLOOD LEFT ARM   Final   Special Requests BOTTLES DRAWN AEROBIC AND ANAEROBIC 10CC   Final   Culture  Setup Time 01/26/2013 22:20   Final   Culture     Final   Value:        BLOOD CULTURE RECEIVED NO GROWTH TO DATE CULTURE WILL BE HELD FOR 5 DAYS BEFORE ISSUING A FINAL NEGATIVE REPORT   Report Status PENDING   Incomplete  CULTURE, BLOOD (ROUTINE X 2)     Status: None   Collection Time    01/26/13  5:00 PM      Result Value Range  Status   Specimen Description BLOOD RIGHT HAND   Final   Special Requests BOTTLES DRAWN AEROBIC AND ANAEROBIC 10CC   Final   Culture  Setup Time 01/26/2013 22:20   Final   Culture     Final   Value:        BLOOD CULTURE RECEIVED NO GROWTH TO DATE CULTURE WILL BE HELD FOR 5 DAYS BEFORE ISSUING A FINAL NEGATIVE REPORT   Report Status PENDING   Incomplete  WOUND CULTURE     Status: None   Collection Time    01/26/13  5:45 PM      Result Value Range Status   Specimen Description WOUND LEG LEFT  Final   Special Requests Normal   Final   Gram Stain     Final   Value: NO WBC SEEN     NO SQUAMOUS EPITHELIAL CELLS SEEN     RARE GRAM NEGATIVE RODS     RARE GRAM POSITIVE COCCI IN PAIRS   Culture     Final   Value: MULTIPLE ORGANISMS PRESENT, NONE PREDOMINANT     Note: NO STAPHYLOCOCCUS AUREUS ISOLATED NO GROUP A STREP (S.PYOGENES) ISOLATED   Report Status 01/29/2013 FINAL   Final  WOUND CULTURE     Status: None   Collection Time    01/26/13  5:45 PM      Result Value Range Status   Specimen Description WOUND LEG LEFT   Final   Special Requests LEFT LOWER LEG   Final   Gram Stain     Final   Value: NO WBC SEEN     NO SQUAMOUS EPITHELIAL CELLS SEEN     NO ORGANISMS SEEN   Culture     Final   Value: MULTIPLE ORGANISMS PRESENT, NONE PREDOMINANT     Note: NO STAPHYLOCOCCUS AUREUS ISOLATED NO GROUP A STREP (S.PYOGENES) ISOLATED   Report Status 01/29/2013 FINAL   Final     Studies: Dg Chest 2 View  01/26/2013   *RADIOLOGY REPORT*  Clinical Data: 79 year old female with pain and peripheral edema. History of diabetes and hypertension.  CHEST - 2 VIEW  Comparison: 05/18/2011 and prior chest radiographs dating back to 10/25/2006  Findings: Cardiomegaly and pulmonary vascular congestion noted. There is no evidence of focal airspace disease, pulmonary edema, suspicious pulmonary nodule/mass, pleural effusion, or pneumothorax. No acute bony abnormalities are identified.  IMPRESSION: Cardiomegaly with  pulmonary vascular congestion.   Original Report Authenticated By: Harmon Pier, M.D.   Dg Tibia/fibula Left  01/26/2013   *RADIOLOGY REPORT*  Clinical Data: Left lower extremity wound infection.  LEFT TIBIA AND FIBULA - 2 VIEW  Comparison: None  Findings: Soft tissue irregularity along the lower left leg noted. There is no radiographic evidence of osteomyelitis. No fracture, subluxation or dislocation noted. Moderate to severe tricompartmental degenerative knee changes are present. Vascular calcifications are identified.  IMPRESSION: Soft tissue wound  without acute bony abnormality.   Original Report Authenticated By: Harmon Pier, M.D.   Dg Tibia/fibula Right  01/26/2013   *RADIOLOGY REPORT*  Clinical Data: Right lower extremity wound infection.  RIGHT TIBIA AND FIBULA - 2 VIEW  Comparison: None  Findings: Soft tissue irregularity along the lower right leg noted. There is no evidence of acute fracture, subluxation or dislocation. No radiographic evidence of osteomyelitis is noted. Moderate tricompartmental degenerative changes within the knee are present. Vascular calcifications are identified.  IMPRESSION: Soft tissue wound  without evidence of acute bony abnormality or radiographic signs of osteomyelitis.   Original Report Authenticated By: Harmon Pier, M.D.    Scheduled Meds: . atorvastatin  40 mg Oral q1800  . citalopram  20 mg Oral Daily  . collagenase   Topical Daily  . diltiazem  120 mg Oral Daily  . furosemide  40 mg Intravenous Q12H  . hydrALAZINE  50 mg Oral Q8H  . insulin aspart  0-15 Units Subcutaneous TID WC  . insulin aspart  0-5 Units Subcutaneous QHS  . labetalol  300 mg Oral BID  . levothyroxine  25 mcg Oral QAC breakfast  . piperacillin-tazobactam (ZOSYN)  IV  3.375 g Intravenous Q8H  . polyethylene glycol  17 g Oral Daily  . potassium chloride  20 mEq Oral Daily  . vancomycin  750 mg Intravenous Q24H  . Warfarin - Pharmacist Dosing Inpatient   Does not apply q1800    Continuous Infusions:   Principal Problem:   Bilateral lower leg cellulitis Active Problems:   DIABETES MELLITUS, TYPE II   OBESITY   HYPERTENSION   FIBRILLATION, ATRIAL   Hypothyroidism   Acute on chronic diastolic heart failure   CKD (chronic kidney disease) stage 2, GFR 60-89 ml/min    Time spent: 40 minutes   [facility]  [facility] Pager [phone]. If 8PM-8AM, please contact night-coverage at [email or web address], password [email or web address] 01/31/2013, 8:15 AM  LOS: 5 days

## 2013-01-31 NOTE — Progress Notes (Signed)
ANTICOAGULATION CONSULT NOTE - Follow-up  Pharmacy Consult for Coumadin Indication: atrial fibrillation  Allergies  Allergen Reactions  . Clonidine Derivatives Rash    Skin breaks out    Patient Measurements: Height: 5\' 3"  (160 cm) Weight: 189 lb 2.5 oz (85.8 kg) IBW/kg (Calculated) : 52.4  Vital Signs: Temp: 98.8 F (37.1 C) (08/06 0553) BP: 144/63 mmHg (08/06 0553) Pulse Rate: 87 (08/06 0553)  Labs:  Recent Labs  01/29/13 0530 01/30/13 0535 01/31/13 0500  HGB  --   --  9.8*  HCT  --   --  29.2*  PLT  --   --  234  LABPROT 23.3* 23.8* 23.8*  INR 2.15* 2.21* 2.21*  CREATININE  --  1.25* 1.40*    Estimated Creatinine Clearance: 33.8 ml/min (by C-G formula based on Cr of 1.4).   Assessment: 67 YOF continues on coumadin  for afib. INR is now therapeutic at 2.21 and Lovenox d/c 8/4. No new CBC today, no bleeding noted.  B/L lower extremity cellulitis currently on Zosyn EI and Vancomycin 750mg  q24 and doses appropriate for renal function.  Goal of Therapy:  INR 2-3 Monitor platelets by anticoagulation protocol: Yes   Plan:  1. Warfarin 7.5mg  PO x 1 tonight 2. F/u AM INR 3. Continue current abx    [name] Pharm.D. CPP, BCPS Clinical Pharmacist [phone] 01/31/2013 2:08 PM

## 2013-01-31 NOTE — Progress Notes (Addendum)
   CARE MANAGEMENT NOTE 01/31/2013  Patient:  [name], [name]   Account Number:  [id number]  Date Initiated:  01/31/2013  Documentation initiated by:  [facility]  Subjective/Objective Assessment:   Bilateral lower leg cellulitis       Action/Plan:   HH, active with [facility]  lives with dtr-in-law, Dorien Chihuahua   Anticipated DC Date:  02/01/2013   Anticipated DC Plan:  HOME W HOME HEALTH SERVICES      DC Planning Services  CM consult      [facility] Choice  Resumption Of Svcs/PTA Provider  HOME HEALTH   Choice offered to / List presented to:  C-1 Patient        HH arranged  HH-1 RN  HH-2 PT      [facility] agency  [facility]   Status of service:  Completed, signed off Medicare Important Message given?   (If response is "NO", the following Medicare IM given date fields will be blank) Date Medicare IM given:   Date Additional Medicare IM given:    Discharge Disposition:  HOME W HOME HEALTH SERVICES  Per UR Regulation:    If discussed at Long Length of Stay Meetings, dates discussed:    Comments:  01/31/2013 1200 NCM spoke to pt and she was set up with [facility] for [facility]. She has DME at home, RW, 3n1 and wheelchair. She has family to support her at home. [facility] aware of pt admission and orders in [facility] for resumption of care with dressing changes ordered by surgeon. [facility] rep will add dressing changes to [facility]. Isidoro Donning RN CCM Case Mgmt phone [phone]

## 2013-02-01 LAB — CULTURE, BLOOD (ROUTINE X 2)

## 2013-02-01 LAB — BASIC METABOLIC PANEL
Calcium: 9.3 mg/dL (ref 8.4–10.5)
Chloride: 98 mEq/L (ref 96–112)
Creatinine, Ser: 1.28 mg/dL — ABNORMAL HIGH (ref 0.50–1.10)
GFR calc Af Amer: 45 mL/min — ABNORMAL LOW (ref >90)

## 2013-02-01 LAB — PROTIME-INR
INR: 2.52 — ABNORMAL HIGH (ref 0.00–1.49)
Prothrombin Time: 26.3 seconds — ABNORMAL HIGH (ref 11.6–15.2)

## 2013-02-01 LAB — GLUCOSE, CAPILLARY

## 2013-02-01 MED ORDER — POTASSIUM CHLORIDE CRYS ER 20 MEQ PO TBCR: 60.0000 meq | EXTENDED_RELEASE_TABLET | Freq: Every day | ORAL | Status: DC

## 2013-02-01 MED ORDER — WARFARIN SODIUM 7.5 MG PO TABS
7.5000 mg | ORAL_TABLET | Freq: Once | ORAL | Status: DC
  Filled 2013-02-01: qty 1

## 2013-02-01 MED ORDER — LEVOFLOXACIN 500 MG PO TABS: 500.0000 mg | ORAL_TABLET | Freq: Every day | ORAL | Status: DC

## 2013-02-01 MED ORDER — HYDROCODONE-ACETAMINOPHEN 5-325 MG PO TABS: ORAL_TABLET | ORAL | Status: DC

## 2013-02-01 MED ORDER — COLLAGENASE 250 UNIT/GM EX OINT: TOPICAL_OINTMENT | Freq: Every day | CUTANEOUS | Status: DC

## 2013-02-01 MED ORDER — WARFARIN SODIUM 7.5 MG PO TABS: 7.5000 mg | ORAL_TABLET | ORAL | Status: DC

## 2013-02-01 MED ORDER — WARFARIN SODIUM 5 MG PO TABS
5.0000 mg | ORAL_TABLET | ORAL | Status: AC
  Administered 2013-02-01: 5 mg via ORAL
  Filled 2013-02-01: qty 1

## 2013-02-01 MED ORDER — DOXYCYCLINE HYCLATE 50 MG PO CAPS: 100.0000 mg | ORAL_CAPSULE | Freq: Two times a day (BID) | ORAL | Status: AC

## 2013-02-01 NOTE — Progress Notes (Signed)
Patient to be d/c to home with family.  Prescriptions given and d/c instructions reviewed.  IV removed.

## 2013-02-01 NOTE — Evaluation (Signed)
Occupational Therapy Evaluation Patient Details Name: LIKISHA ALLES MRN: [id number] DOB: 1931/06/29 Today's Date: 02/01/2013 Time: 2725-3664 OT Time Calculation (min): 21 min  OT Assessment / Plan / Recommendation History of present illness Bilateral lower leg cellulitis    Clinical Impression   Pt demos decline in function with ADLs and ADL mobility. Pt currently requires mod/max assist to stand and +2 to perform pivot transfers. Her. Pt reports multiple falls at home recently after she "feels confident" and walks without a walker. Pt requires total A with LB ADLs and toileting    OT Assessment  Patient needs continued OT Services    Follow Up Recommendations  SNF    Barriers to Discharge Decreased caregiver support    Equipment Recommendations  None recommended by OT;Other (comment) (TBD)    Recommendations for Other Services    Frequency  Min 2X/week    Precautions / Restrictions Precautions Precautions: Fall Restrictions Weight Bearing Restrictions: No Other Position/Activity Restrictions:  B LEs with dressings   Pertinent Vitals/Pain 4/10 B LEs    ADL  Grooming: Performed;Wash/dry hands;Wash/dry face;Supervision/safety;Set up Where Assessed - Grooming: Supported sitting Upper Body Bathing: Simulated;Minimal assistance Lower Body Bathing: +1 Total assistance Upper Body Dressing: Performed;Minimal assistance Lower Body Dressing: +1 Total assistance Toilet Transfer: Performed;+1 Total assistance Toilet Transfer Method: Sit to stand;Squat pivot Toilet Transfer Equipment: Bedside commode Toileting - Clothing Manipulation and Hygiene: +1 Total assistance Tub/Shower Transfer Method: Not assessed Equipment Used: Gait belt;Rolling walker;Other (comment) (BSC) Transfers/Ambulation Related to ADLs: cues for correct hand placement and for tranition of B UE to RW, standing upright ADL Comments: Total A for LB ADLs and toileting    OT Diagnosis: Generalized weakness  OT  Problem List: Decreased strength;Decreased knowledge of use of DME or AE;Decreased activity tolerance;Impaired balance (sitting and/or standing) OT Treatment Interventions: Self-care/ADL training;Therapeutic exercise;Balance training;Patient/family education;Therapeutic activities;DME and/or AE instruction   OT Goals(Current goals can be found in the care plan section) Acute Rehab OT Goals Patient Stated Goal: Go home OT Goal Formulation: With patient Time For Goal Achievement: 02/08/13 Potential to Achieve Goals: Good ADL Goals Pt Will Perform Grooming: with min assist;with mod assist;standing Pt Will Perform Upper Body Bathing: with set-up;with supervision;sitting;with min guard assist (EOB) Pt Will Perform Upper Body Dressing: with min guard assist;with set-up;with supervision;sitting (EOB) Pt Will Transfer to Toilet: with mod assist;with max assist;bedside commode Pt Will Perform Toileting - Clothing Manipulation and hygiene: with max assist  Visit Information  Last OT Received On: 02/01/13 Assistance Needed: +2 History of Present Illness: Bilateral lower leg cellulitis        Prior Functioning     Home Living Family/patient expects to be discharged to:: Private residence Living Arrangements:  (daughter-in-law) Available Help at Discharge: Family Type of Home: House Home Access: Ramped entrance Home Layout: One level Home Equipment: Cane - single point;Tub bench;Wheelchair - manual;Bedside commode;Walker - 4 wheels Prior Function Level of Independence: Needs assistance Gait / Transfers Assistance Needed: Used RW sometime (had multiple falls without device) ADL's / Homemaking Assistance Needed: Family takes care of Communication Communication: No difficulties Dominant Hand: Right         Vision/Perception Vision - History Baseline Vision: Wears glasses all the time Patient Visual Report: No change from baseline Perception Perception: Within Functional Limits    Cognition  Cognition Arousal/Alertness: Awake/alert Behavior During Therapy: WFL for tasks assessed/performed Overall Cognitive Status: Within Functional Limits for tasks assessed    Extremity/Trunk Assessment Upper Extremity Assessment Upper Extremity Assessment: Overall WFL for  tasks assessed;Generalized weakness Lower Extremity Assessment Cervical / Trunk Assessment Cervical / Trunk Assessment: Kyphotic     Mobility Bed Mobility Bed Mobility: Not assessed Details for Bed Mobility Assistance: pt sitting in recliner upon entering room Transfers Transfers: Sit to Stand;Stand to Sit Sit to Stand: 3: Mod assist;2: Max assist;From chair/3-in-1 Stand to Sit: 3: Mod assist;2: Max assist;To chair/3-in-1 Details for Transfer Assistance: Max A for full stading position, pt demos difficulty with hand placement/transition to RW. Diffuculty with upright standing     Exercise     Balance Balance Balance Assessed: Yes Static Sitting Balance Static Sitting - Balance Support: Feet supported;No upper extremity supported (stand by assist) Static Standing Balance Dynamic Standing Balance Dynamic Standing - Balance Support: Right upper extremity supported;During functional activity Dynamic Standing - Level of Assistance: 2: Max assist   End of Session OT - End of Session Equipment Utilized During Treatment: Gait belt;Rolling walker;Other (comment) (BSC) Activity Tolerance: Patient limited by fatigue Patient left: in chair;with call bell/phone within reach  GO     Galen Manila 02/01/2013, 12:35 PM

## 2013-02-01 NOTE — Plan of Care (Signed)
Problem: Phase III Progression Outcomes Goal: Discharge plan remains appropriate-arrangements made Recommend SNF for further rehab needs after acute care d/c

## 2013-02-01 NOTE — Discharge Summary (Addendum)
Physician Discharge Summary  [name] MRN: [id number] DOB/AGE: [birth year] 79 y.o.  PCP: Oliver Barre, MD   Admit date: 01/26/2013 Discharge date: 02/01/2013  Discharge Diagnoses:   :   Bilateral lower leg cellulitis Active Problems:   DIABETES MELLITUS, TYPE II   OBESITY   HYPERTENSION   FIBRILLATION, ATRIAL   Hypothyroidism   Acute on chronic diastolic heart failure   CKD (chronic kidney disease) stage 2, GFR 60-89 ml/min   Followup recommendations #1 continue to follow up with Dr. Lajoyce Corners for wound care #2 recheck CBC, BMP in one week     Medication List    STOP taking these medications       cephALEXin 500 MG capsule  Commonly known as:  KEFLEX     lovastatin 40 MG tablet  Commonly known as:  MEVACOR      TAKE these medications       ALPRAZolam 0.25 MG tablet  Commonly known as:  XANAX  Take 0.25 mg by mouth 2 (two) times daily as needed. For anxiety     atorvastatin 40 MG tablet  Commonly known as:  LIPITOR  Take 1 tablet (40 mg total) by mouth daily at 6 PM.     citalopram 20 MG tablet  Commonly known as:  CELEXA  Take 20 mg by mouth daily.     collagenase ointment  Commonly known as:  SANTYL  Apply topically daily.     diltiazem 120 MG 24 hr capsule  Commonly known as:  CARDIZEM CD  Take 120 mg by mouth daily.     docusate sodium 100 MG capsule  Commonly known as:  COLACE  Take 100 mg by mouth 2 (two) times daily as needed. For constipation     doxycycline 50 MG capsule  Commonly known as:  VIBRAMYCIN  Take 2 capsules (100 mg total) by mouth 2 (two) times daily.     furosemide 40 MG tablet  Commonly known as:  LASIX  Take 1 tablet (40 mg total) by mouth daily.     hydrALAZINE 50 MG tablet  Commonly known as:  APRESOLINE  Take 50 mg by mouth 3 (three) times daily.     HYDROcodone-acetaminophen 5-325 MG per tablet  Commonly known as:  NORCO/VICODIN  TAKE 1 TABLET BY MOUTH EVERY 8 HOURS AS NEEDED FOR PAIN     HYDROcodone-acetaminophen 5-325 MG per tablet  Commonly known as:  NORCO/VICODIN  Take 1 tablet by mouth every 6 (six) hours as needed for pain.     labetalol 300 MG tablet  Commonly known as:  NORMODYNE  Take 300 mg by mouth 2 (two) times daily.     levofloxacin 500 MG tablet  Commonly known as:  LEVAQUIN  Take 1 tablet (500 mg total) by mouth daily.     levothyroxine 25 MCG tablet  Commonly known as:  SYNTHROID, LEVOTHROID  Take 25 mcg by mouth daily before breakfast.     lidocaine 5 %  Commonly known as:  LIDODERM  Place 1 patch onto the skin daily. Remove & Discard patch within 12 hours or as directed by MD     metFORMIN 500 MG tablet  Commonly known as:  GLUCOPHAGE  Take 500 mg by mouth daily with breakfast.     senna 8.6 MG Tabs tablet  Commonly known as:  SENOKOT  Take 2 tablets by mouth daily as needed. For constipation     sorbitol 70 % solution  Take 25 mLs by mouth  daily as needed. For constipation     Vitamin D 1000 UNITS capsule  Take 1,000 Units by mouth daily.     warfarin 5 MG tablet  Commonly known as:  COUMADIN  Take 5-7.5 mg by mouth daily. Take 7.5mg  om Monday, Wednesday and Friday. Take 5mg  the rest of the week        Discharge Condition:   Disposition: 01-Home or Self Care   Consults:  Orthopedics   Significant Diagnostic Studies: Dg Chest 2 View  01/26/2013   *RADIOLOGY REPORT*  Clinical Data: 79 year old female with pain and peripheral edema. History of diabetes and hypertension.  CHEST - 2 VIEW  Comparison: 05/18/2011 and prior chest radiographs dating back to 10/25/2006  Findings: Cardiomegaly and pulmonary vascular congestion noted. There is no evidence of focal airspace disease, pulmonary edema, suspicious pulmonary nodule/mass, pleural effusion, or pneumothorax. No acute bony abnormalities are identified.  IMPRESSION: Cardiomegaly with pulmonary vascular congestion.   Original Report Authenticated By: Harmon Pier, M.D.   Dg Tibia/fibula  Left  01/26/2013   *RADIOLOGY REPORT*  Clinical Data: Left lower extremity wound infection.  LEFT TIBIA AND FIBULA - 2 VIEW  Comparison: None  Findings: Soft tissue irregularity along the lower left leg noted. There is no radiographic evidence of osteomyelitis. No fracture, subluxation or dislocation noted. Moderate to severe tricompartmental degenerative knee changes are present. Vascular calcifications are identified.  IMPRESSION: Soft tissue wound  without acute bony abnormality.   Original Report Authenticated By: Harmon Pier, M.D.   Dg Tibia/fibula Right  01/26/2013   *RADIOLOGY REPORT*  Clinical Data: Right lower extremity wound infection.  RIGHT TIBIA AND FIBULA - 2 VIEW  Comparison: None  Findings: Soft tissue irregularity along the lower right leg noted. There is no evidence of acute fracture, subluxation or dislocation. No radiographic evidence of osteomyelitis is noted. Moderate tricompartmental degenerative changes within the knee are present. Vascular calcifications are identified.  IMPRESSION: Soft tissue wound  without evidence of acute bony abnormality or radiographic signs of osteomyelitis.   Original Report Authenticated By: Harmon Pier, M.D.      Microbiology: Recent Results (from the past 240 hour(s))  CULTURE, BLOOD (ROUTINE X 2)     Status: None   Collection Time    01/26/13  4:45 PM      Result Value Range Status   Specimen Description BLOOD LEFT ARM   Final   Special Requests BOTTLES DRAWN AEROBIC AND ANAEROBIC 10CC   Final   Culture  Setup Time     Final   Value: 01/26/2013 22:20     Performed at [facility]   Culture     Final   Value: NO GROWTH 5 DAYS     Performed at [facility]   Report Status 02/01/2013 FINAL   Final  CULTURE, BLOOD (ROUTINE X 2)     Status: None   Collection Time    01/26/13  5:00 PM      Result Value Range Status   Specimen Description BLOOD RIGHT HAND   Final   Special Requests BOTTLES DRAWN AEROBIC AND ANAEROBIC 10CC    Final   Culture  Setup Time     Final   Value: 01/26/2013 22:20     Performed at [facility]   Culture     Final   Value: NO GROWTH 5 DAYS     Performed at [facility]   Report Status 02/01/2013 FINAL   Final  WOUND CULTURE  Status: None   Collection Time    01/26/13  5:45 PM      Result Value Range Status   Specimen Description WOUND LEG LEFT   Final   Special Requests Normal   Final   Gram Stain     Final   Value: NO WBC SEEN     NO SQUAMOUS EPITHELIAL CELLS SEEN     RARE GRAM NEGATIVE RODS     RARE GRAM POSITIVE COCCI IN PAIRS   Culture     Final   Value: MULTIPLE ORGANISMS PRESENT, NONE PREDOMINANT     Note: NO STAPHYLOCOCCUS AUREUS ISOLATED NO GROUP A STREP (S.PYOGENES) ISOLATED   Report Status 01/29/2013 FINAL   Final  WOUND CULTURE     Status: None   Collection Time    01/26/13  5:45 PM      Result Value Range Status   Specimen Description WOUND LEG LEFT   Final   Special Requests LEFT LOWER LEG   Final   Gram Stain     Final   Value: NO WBC SEEN     NO SQUAMOUS EPITHELIAL CELLS SEEN     NO ORGANISMS SEEN   Culture     Final   Value: MULTIPLE ORGANISMS PRESENT, NONE PREDOMINANT     Note: NO STAPHYLOCOCCUS AUREUS ISOLATED NO GROUP A STREP (S.PYOGENES) ISOLATED   Report Status 01/29/2013 FINAL   Final     Labs: Results for orders placed during the hospital encounter of 01/26/13 (from the past 48 hour(s))  GLUCOSE, CAPILLARY     Status: Abnormal   Collection Time    01/30/13 11:17 AM      Result Value Range   Glucose-Capillary 151 (*) 70 - 99 mg/dL   Comment 1 Notify RN     Comment 2 Documented in Chart    GLUCOSE, CAPILLARY     Status: Abnormal   Collection Time    01/30/13  4:05 PM      Result Value Range   Glucose-Capillary 163 (*) 70 - 99 mg/dL   Comment 1 Notify RN     Comment 2 Documented in Chart    GLUCOSE, CAPILLARY     Status: Abnormal   Collection Time    01/30/13 10:12 PM      Result Value Range   Glucose-Capillary 127  (*) 70 - 99 mg/dL  PROTIME-INR     Status: Abnormal   Collection Time    01/31/13  5:00 AM      Result Value Range   Prothrombin Time 23.8 (*) 11.6 - 15.2 seconds   INR 2.21 (*) 0.00 - 1.49  CBC     Status: Abnormal   Collection Time    01/31/13  5:00 AM      Result Value Range   WBC 5.9  4.0 - 10.5 K/uL   RBC 3.18 (*) 3.87 - 5.11 MIL/uL   Hemoglobin 9.8 (*) 12.0 - 15.0 g/dL   HCT 98.1 (*) 19.1 - 47.8 %   MCV 91.8  78.0 - 100.0 fL   MCH 30.8  26.0 - 34.0 pg   MCHC 33.6  30.0 - 36.0 g/dL   RDW 29.5  62.1 - 30.8 %   Platelets 234  150 - 400 K/uL  BASIC METABOLIC PANEL     Status: Abnormal   Collection Time    01/31/13  5:00 AM      Result Value Range   Sodium 139  135 - 145 mEq/L   Potassium  3.6  3.5 - 5.1 mEq/L   Comment: DELTA CHECK NOTED     NO VISIBLE HEMOLYSIS   Chloride 100  96 - 112 mEq/L   CO2 30  19 - 32 mEq/L   Glucose, Bld 130 (*) 70 - 99 mg/dL   BUN 21  6 - 23 mg/dL   Creatinine, Ser 9.56 (*) 0.50 - 1.10 mg/dL   Calcium 9.0  8.4 - 21.3 mg/dL   GFR calc non Af Amer 35 (*) >90 mL/min   GFR calc Af Amer 40 (*) >90 mL/min   Comment:            The eGFR has been calculated     using the CKD EPI equation.     This calculation has not been     validated in all clinical     situations.     eGFR's persistently     <90 mL/min signify     possible Chronic Kidney Disease.  GLUCOSE, CAPILLARY     Status: Abnormal   Collection Time    01/31/13  6:56 AM      Result Value Range   Glucose-Capillary 120 (*) 70 - 99 mg/dL  GLUCOSE, CAPILLARY     Status: Abnormal   Collection Time    01/31/13 11:47 AM      Result Value Range   Glucose-Capillary 171 (*) 70 - 99 mg/dL   Comment 1 Documented in Chart     Comment 2 Notify RN    GLUCOSE, CAPILLARY     Status: Abnormal   Collection Time    01/31/13  4:43 PM      Result Value Range   Glucose-Capillary 119 (*) 70 - 99 mg/dL   Comment 1 Documented in Chart     Comment 2 Notify RN    GLUCOSE, CAPILLARY     Status: Abnormal    Collection Time    01/31/13  9:39 PM      Result Value Range   Glucose-Capillary 155 (*) 70 - 99 mg/dL  PROTIME-INR     Status: Abnormal   Collection Time    02/01/13  5:00 AM      Result Value Range   Prothrombin Time 26.3 (*) 11.6 - 15.2 seconds   INR 2.52 (*) 0.00 - 1.49  BASIC METABOLIC PANEL     Status: Abnormal   Collection Time    02/01/13  5:00 AM      Result Value Range   Sodium 138  135 - 145 mEq/L   Potassium 3.4 (*) 3.5 - 5.1 mEq/L   Chloride 98  96 - 112 mEq/L   CO2 32  19 - 32 mEq/L   Glucose, Bld 117 (*) 70 - 99 mg/dL   BUN 22  6 - 23 mg/dL   Creatinine, Ser 0.86 (*) 0.50 - 1.10 mg/dL   Calcium 9.3  8.4 - 57.8 mg/dL   GFR calc non Af Amer 39 (*) >90 mL/min   GFR calc Af Amer 45 (*) >90 mL/min   Comment:            The eGFR has been calculated     using the CKD EPI equation.     This calculation has not been     validated in all clinical     situations.     eGFR's persistently     <90 mL/min signify     possible Chronic Kidney Disease.  GLUCOSE, CAPILLARY     Status: Abnormal  Collection Time    02/01/13  6:48 AM      Result Value Range   Glucose-Capillary 130 (*) 70 - 99 mg/dL     Interim summary:  Patient is a 79 year old African American female past medical history of atrial fibrillation on chronic anticoagulation, diastolic dysfunction and diabetes mellitus who has chronic lower extremity leg ulcers which have started to worsen over the past 2 months. Patient was urged come in as per her family noted that the ulcers looked even worse and was starting to have foul-smelling discharge. Patient was admitted to the hospitalist service, started on broad-spectrum IV antibiotics. ESR and CRP were done which were normal and x-rays noted soft tissue infection, but no signs of bone destruction. Wound care was consulted who saw the patient and recommended getting ABIs. ABIs on the right side were noted at 1.35 and on left are not able to be done secondary to in  comparison will vessels. As per wound care recommendations, Dr.Duda has been consulted who has some specialization with treating venous insufficiency wounds with surgical and compression therapy. In addition, patient has a history of diastolic heart failure. BNP checked and found to be mildly elevated in 3000. Diuretics started. Of note, patient had a change in her cholesterol medicine. See below   Assessment/Plan:    Bilateral lower leg cellulitis: Started on treatment with vancomycin and Zosyn. Was normal CRP and sed rate, I do not think is osteomyelitis. ABIs noted to be 1.35 on right, unable to be ascertained on left. Orthopedics recommended aggressive nutritional management with increased nutrition to potentially heal the wounds. Start the patient on Santyl dressing changes in the hospital with discharge on Santyl dressing changes daily. Home health RN to arrange for dressing changes Patient to be discharged home on by mouth doxycycline and by mouth Levaquin for 3 weeks   DIABETES MELLITUS, TYPE II controlled with renal dysfunction : Resume all regimen OBESITY  HYPERTENSION: Continue home meds. Monitor blood pressure   FIBRILLATION, ATRIAL: INR therapeutic on Coumadin. Resume all regimen of rate control  Hypothyroidism: Continue Synthroid  Acute on chronic Diastolic dysfunction: Continue home dose of Lasix. Monitor output   Chronic stage II kidney disease: Monitor renal function while diuresing   Anemia: Likely secondary to chronic disease   Hyperlipidemia: Patient at home was on lovastatin which was changed to Zocor here. Both of the statin medications can have increased risk of rhabdomyolysis and attitude diltiazem which she is also on. As per pharmacy recommendations have changed to Lipitor which should be done permanently.    Code Status: DO NOT RESUSCITATE      Discharge Exam: Blood pressure 153/67, pulse 62, temperature 98.2 F (36.8 C), temperature source Oral, resp. rate  18, height 5\' 3"  (1.6 m), weight 85.8 kg (189 lb 2.5 oz), SpO2 100.00%. General: Somnolent  Cardiovascular: Regular rate and rhythm, occasional ectopic beat  Respiratory: Decreased breath sounds throughout  Abdomen: Soft, nontender, nondistended, hypoactive bowel sounds  Musculoskeletal: Bilateral lower extremity is wrapped, 1+ pitting edema, active drainage           Future Appointments Provider Department Dept Phone   02/07/2013 10:45 AM [facility] [phone]   02/13/2013 11:00 AM Corwin Levins, MD [facility] [phone]      Follow-up Information   Follow up with DUDA,MARCUS V, MD In 1 week.   Contact information:   [address] [place] [address] [phone]  Follow up with [facility]. [facility] RN and Physical Therapy, dressing changes)    Contact information:   [phone]      Signed: Richarda Overlie 02/01/2013, 8:30 AM

## 2013-02-01 NOTE — Evaluation (Addendum)
Physical Therapy Evaluation Patient Details Name: [name] MRN: [id number] DOB: 09-07-1931 Today's Date: 02/01/2013 Time: 6578-4696 PT Time Calculation (min): 38 min  PT Assessment / Plan / Recommendation History of Present Illness  Bilateral lower leg cellulitis   Clinical Impression  Pt currently requires mod/max assist to stand and +2 to perform pivot transfers. Her Rt. Knee tends to buckle when in standing and did with me today with multiple bouts of standing requiring assist to safely return to chair. Pt reports multiple falls at home recently after she "feels confident" and walks without a walker. After education pt still doesn't feel she needs to use RW demonstrating decreased safety awareness, will need 24 hour assist and two persons for mobility. I recommend pt get further rehab either from CIR or SNF for improved strength, balance, and safety for transfers and gait prior to return home as she currently requires too heavy of assist for family and is at a very high risk for fall with serious injury. Will follow acutely.     PT Assessment  Patient needs continued PT services    Follow Up Recommendations  SNF;CIR;Supervision/Assistance - 24 hour    Does the patient have the potential to tolerate intense rehabilitation    potentially   Barriers to Discharge Decreased caregiver support      Equipment Recommendations  None recommended by PT    Recommendations for Other Services Rehab consult   Frequency Min 3X/week    Precautions / Restrictions Restrictions Weight Bearing Restrictions: No   Pertinent Vitals/Pain Grimaces with dressing changes, not rated      Mobility  Bed Mobility Details for Bed Mobility Assistance: seated at start Transfers Transfers: Sit to Stand;Stand to Sit Sit to Stand: 3: Mod assist;2: Max assist;From chair/3-in-1 Stand to Sit: 3: Mod assist;2: Max assist;To chair/3-in-1 Details for Transfer Assistance: Mod to max assist to reach full standing  position, pt has most difficulty with transition of hands from chair to RW. Performed multiple times with PT cuing for hand placement, prestand mechanics and facilitation of glutes to reach full stand.  Ambulation/Gait Ambulation/Gait Assistance:  (pt unable to safely perform with one person)        PT Diagnosis: Difficulty walking;Generalized weakness;Acute pain  PT Problem List: Decreased strength;Decreased range of motion;Decreased activity tolerance;Decreased balance;Decreased knowledge of use of DME;Decreased cognition;Decreased coordination;Decreased mobility;Decreased safety awareness;Impaired sensation;Pain;Decreased skin integrity PT Treatment Interventions: DME instruction;Gait training;Functional mobility training;Therapeutic activities;Patient/family education;Neuromuscular re-education;Balance training;Therapeutic exercise;Wheelchair mobility training     PT Goals(Current goals can be found in the care plan section) Acute Rehab PT Goals Patient Stated Goal: Go home PT Goal Formulation: With patient Time For Goal Achievement: 02/08/13 Potential to Achieve Goals: Good  Visit Information  Last PT Received On: 02/01/13 Assistance Needed: +2 History of Present Illness: Bilateral lower leg cellulitis        Prior Functioning  Home Living Family/patient expects to be discharged to:: Private residence Living Arrangements:  (daughter-in-law) Available Help at Discharge: Family Type of Home: House Home Access: Ramped entrance Home Layout: One level Home Equipment: Cane - single point;Tub bench;Wheelchair - manual;Bedside commode Prior Function Level of Independence: Needs assistance Gait / Transfers Assistance Needed: Used RW sometime (had multiple falls without device) ADL's / Homemaking Assistance Needed: Family takes care of Communication Communication: No difficulties    Cognition  Cognition Arousal/Alertness: Awake/alert Overall Cognitive Status:  (decreased  awareness of deficits)    Extremity/Trunk Assessment Upper Extremity Assessment Upper Extremity Assessment: Generalized weakness Lower Extremity Assessment Lower  Extremity Assessment: Generalized weakness;RLE deficits/detail;LLE deficits/detail RLE Deficits / Details: Decreased Rt. knee/hip extension, weakness grossly 3-/5 RLE Sensation: decreased light touch LLE Deficits / Details: Decreased Lt. knee/hip extension but less impaired than Rt, weakness grossly 3-/5 LLE Sensation: decreased light touch Cervical / Trunk Assessment Cervical / Trunk Assessment: Kyphotic   Balance Balance Balance Assessed: Yes Static Sitting Balance Static Sitting - Balance Support: Feet supported;No upper extremity supported (stand by assist) Static Standing Balance Static Standing - Comment/# of Minutes: posterior weight shift in standing, progressed to min assist however needed constant assist to prevent fall. Pt stands in trendelenburg position with Rt. knee flexed.   End of Session PT - End of Session Equipment Utilized During Treatment: Gait belt Activity Tolerance: Patient limited by fatigue Patient left: in chair;with call bell/phone within reach Nurse Communication: Mobility status  GP     Wilhemina Bonito 02/01/2013, 11:39 AM

## 2013-02-01 NOTE — Clinical Social Work Psychosocial (Signed)
Clinical Social Work Department  BRIEF PSYCHOSOCIAL ASSESSMENT  [name]  Account Number: [id number]   Admit date: 01/26/2013 Clinical Social Worker Sabino Niemann, MSW Date/Time: 02/01/2013 12:37PM Referred by: Physician Date Referred: 02/01/2013 Referred for   SNF Placement   Other Referral:  Interview type: Patient  Other interview type: PSYCHOSOCIAL DATA  Living Status: Alone Admitted from facility:  Level of care:  Primary support name: Ruffin,Linda  Primary support relationship to patient: Niece Degree of support available:  Strong and vested  CURRENT CONCERNS  Current Concerns   Post-Acute Placement   Other Concerns:  SOCIAL WORK ASSESSMENT / PLAN  CSW met with pt re: PT recommendation for SNF.   Pt lives Alone  CSW explained placement process and answered questions.   Patient is not agreeable to SNF placement and would like to return home with [facility]      Assessment/plan status: Information/Referral to [facility]  Other assessment/ plan:  Information/referral to community resources:  SNF     PATIENT'S/FAMILY'S RESPONSE TO PLAN OF CARE:  Pt is not agreeable to ST SNF in order to increase strength and independence with mobility prior to returning home  Pt verbalized understanding of placement process and appreciation for CSW assist. Clinical Social Worker will sign off for now as social work intervention is no longer needed. Please consult [place] again if new need arises.   Sabino Niemann, MSW [phone]

## 2013-02-01 NOTE — Progress Notes (Signed)
PHARMACY FOLLOW UP CONSULT NOTE   Pharmacy Consult :  Coumadin  Indication: atrial fibrillation  Dosing Weight: 86 kg  Labs:  Recent Labs  01/30/13 0535 01/31/13 0500 02/01/13 0500  HGB  --  9.8*  --   HCT  --  29.2*  --   PLT  --  234  --   INR 2.21* 2.21* 2.52*  CREATININE 1.25* 1.40* 1.28*   Lab Results  Component Value Date   INR 2.52* 02/01/2013   INR 2.21* 01/31/2013   INR 2.21* 01/30/2013   Estimated Creatinine Clearance: 37 ml/min (by C-G formula based on Cr of 1.28).  PTA Medications:  Medication Sig  . warfarin (COUMADIN) 5 MG tablet Take 5-7.5 mg by mouth daily. Take 7.5mg  om Monday, Wednesday and Friday. Take 5mg  the rest of the week   Assessment:  Patient on chronic Coumadin for history of atrial fibrillation   INR 2.52, No bleeding complications noted   Goal of Therapy:  INR 2-3   Plan:   Resume home Coumadin schedule, Coumadin 5 mg daily except 7.5 mg MWF.  Daily INR's, CBC.  Monitor for bleeding complications.   Dominic Rhome, Elisha Headland, Pharm.D.  02/01/2013,6:07 PM

## 2013-02-01 NOTE — Progress Notes (Addendum)
Rehab Admissions Coordinator Note:  Patient was screened by Brock Ra for appropriateness for an Inpatient Acute Rehab Consult.  At this time, we are recommending Skilled Nursing Facility.  Pt's insurance will not cover CIR for her diagnosis-will cover SNF.  Gave this information to pt's SW, Amy.  Brock Ra 02/01/2013, 12:02 PM  I can be reached at [phone].

## 2013-02-05 DIAGNOSIS — L97909 Non-pressure chronic ulcer of unspecified part of unspecified lower leg with unspecified severity: Secondary | ICD-10-CM

## 2013-02-05 DIAGNOSIS — E119 Type 2 diabetes mellitus without complications: Secondary | ICD-10-CM

## 2013-02-05 DIAGNOSIS — L03119 Cellulitis of unspecified part of limb: Secondary | ICD-10-CM

## 2013-02-05 DIAGNOSIS — I872 Venous insufficiency (chronic) (peripheral): Secondary | ICD-10-CM

## 2013-02-05 DIAGNOSIS — L02419 Cutaneous abscess of limb, unspecified: Secondary | ICD-10-CM

## 2013-02-07 ENCOUNTER — Telehealth: Payer: Self-pay

## 2013-02-07 DIAGNOSIS — Z7901 Long term (current) use of anticoagulants: Secondary | ICD-10-CM

## 2013-02-07 DIAGNOSIS — I509 Heart failure, unspecified: Secondary | ICD-10-CM

## 2013-02-07 NOTE — Telephone Encounter (Signed)
Called [facility] left message to call back. 

## 2013-02-07 NOTE — Telephone Encounter (Signed)
Patient sister called lmovm  Stating that [facility] currently comes out to pt home to assist with wound care. She wiould like to request orders to be sent to [facility] for pt to have coumadin levels checked at the home. Thanks

## 2013-02-07 NOTE — Telephone Encounter (Signed)
Robin to check with PCC's; does [facility] have a protocol with checking coumadin at home so that the results is handled by a pharmacist or other person?

## 2013-02-08 NOTE — Telephone Encounter (Signed)
Called [facility] at [phone] [facility]).  HHRN informed as lone as they are seeing the patient already for a skilled need they can do PT/INR as well.  They would come out to the home before 2 pm, report results to their coumadin line (Pharmacist) and get the instructions from there.  Called the sister to inform no answer and vm not set up

## 2013-02-08 NOTE — Telephone Encounter (Signed)
Called left msg. With the patient to have her sister return call

## 2013-02-08 NOTE — Telephone Encounter (Signed)
Ok with me to have the INR per Home Health if the pharmacist is willing to respond to the results and manage

## 2013-02-09 NOTE — Telephone Encounter (Signed)
Called [facility] and they instructed MD could ammend her last OV to order the bed and use guidelines on form from [facility].  Or MD could go into EPIC to order DME.  The PT/INR would need to be done as referral through PCP.

## 2013-02-09 NOTE — Telephone Encounter (Signed)
Called the sister informed of information.  The sister also requesting an order for a hospital bed.

## 2013-02-09 NOTE — Telephone Encounter (Signed)
DME order for bed, and home health referral for PT/INR done

## 2013-02-09 NOTE — Telephone Encounter (Signed)
I think there is a form to sign for this, please have family reqeust this through the Home Health

## 2013-02-09 NOTE — Telephone Encounter (Signed)
Faxed to [facility] and called [facility] at [phone] to inform as well.

## 2013-02-09 NOTE — Addendum Note (Signed)
Addended by: Corwin Levins on: 02/09/2013 11:22 AM   Modules accepted: Orders, Medications

## 2013-02-13 ENCOUNTER — Encounter: Payer: Self-pay | Admitting: Internal Medicine

## 2013-02-13 ENCOUNTER — Ambulatory Visit (INDEPENDENT_AMBULATORY_CARE_PROVIDER_SITE_OTHER): Payer: Medicare Other | Admitting: Internal Medicine

## 2013-02-13 ENCOUNTER — Ambulatory Visit (INDEPENDENT_AMBULATORY_CARE_PROVIDER_SITE_OTHER): Payer: Medicare Other | Admitting: Family Medicine

## 2013-02-13 VITALS — BP 130/90 | HR 89 | Temp 99.5°F | Wt 189.1 lb

## 2013-02-13 DIAGNOSIS — F329 Major depressive disorder, single episode, unspecified: Secondary | ICD-10-CM

## 2013-02-13 DIAGNOSIS — E119 Type 2 diabetes mellitus without complications: Secondary | ICD-10-CM

## 2013-02-13 DIAGNOSIS — E785 Hyperlipidemia, unspecified: Secondary | ICD-10-CM

## 2013-02-13 DIAGNOSIS — I2789 Other specified pulmonary heart diseases: Secondary | ICD-10-CM

## 2013-02-13 DIAGNOSIS — I10 Essential (primary) hypertension: Secondary | ICD-10-CM

## 2013-02-13 DIAGNOSIS — F3289 Other specified depressive episodes: Secondary | ICD-10-CM

## 2013-02-13 DIAGNOSIS — I4891 Unspecified atrial fibrillation: Secondary | ICD-10-CM

## 2013-02-13 LAB — POCT INR: INR: 5

## 2013-02-13 MED ORDER — ONDANSETRON HCL 4 MG PO TABS: 4.0000 mg | ORAL_TABLET | Freq: Three times a day (TID) | ORAL | Status: DC | PRN

## 2013-02-13 MED ORDER — HYDROCODONE-ACETAMINOPHEN 7.5-325 MG PO TABS: 1.0000 | ORAL_TABLET | Freq: Three times a day (TID) | ORAL | Status: DC | PRN

## 2013-02-13 NOTE — Progress Notes (Signed)
Subjective:    Patient ID: [name], female    DOB: 09/18/1931, 79 y.o.   MRN: [id number]  HPI Here to f/u; overall doing ok,  Pt denies chest pain, increased sob or doe, wheezing, orthopnea, PND, palpitations, dizziness or syncope.  Pt denies polydipsia, polyuria, or low sugar symptoms such as weakness or confusion.  Pt denies new neurological symptoms such as new headache, or facial or extremity weakness.   Pt states overall good compliance with meds, has been trying to follow lower cholesterol, diabetic diet.  Has ongoing chronic leg pain attrubted to wounds.  [facility] sees her twice wkly for bandage changes, sees wound clinic weekly.  , currently on doxy after recent levaquin.  Pain approx 6/10 despite meds. Needs coumadin checks per [facility] at her home, and hosp bed - order has been faxed.  C/o intermittent nauasea, rare vomiting, no blood, fever, and nervousness ongoing.  Denies worsening depressive symptoms, suicidal ideation, or panic.   Past Medical History  Diagnosis Date  . Obesity   . Anemia, iron deficiency   . Depression   . Diabetes mellitus type II   . Hyperlipidemia   . Hypertension   . Peptic ulcer   . History of uterine fibroid   . Hx of colonic polyps   . Diverticula, colon   . Osteoarthritis   . Low back pain   . Atrial fibrillation     permanent, on coumadin (followed by [facility] coumadin clinic)  . Pulmonary hypertension     echo 4/12:  EF 55-60%, mod LVH, mild BAE, RVE and PASP 51  . Anxiety   . DIABETES MELLITUS, TYPE II 03/05/2007  . HYPERLIPIDEMIA 03/05/2007  . ANEMIA-IRON DEFICIENCY 03/05/2007  . HYPERTENSION 03/05/2007  . DEPRESSION 03/05/2007  . LOW BACK PAIN 03/05/2007  . PERIPHERAL EDEMA 04/29/2008  . Chronic pain syndrome 06/23/2010  . FIBROIDS, UTERUS 03/05/2007  . PEPTIC ULCER DISEASE 03/05/2007  . DIVERTICULOSIS, COLON 03/05/2007  . OSTEOARTHRITIS 03/05/2007  . COLONIC POLYPS, HX OF 03/05/2007  . Hypothyroidism 10/15/2010  . Renal insufficiency 10/15/2010   Past Surgical  History  Procedure Laterality Date  . Laparoscopic hysterectomy      reports that she has quit smoking. She started smoking about 40 years ago. She does not have any smokeless tobacco history on file. She reports that she does not drink alcohol or use illicit drugs. family history includes Hypertension in her sister. Allergies  Allergen Reactions  . Clonidine Derivatives Rash    Skin breaks out   Current Outpatient Prescriptions on File Prior to Visit  Medication Sig Dispense Refill  . ALPRAZolam (XANAX) 0.25 MG tablet Take 0.25 mg by mouth 2 (two) times daily as needed. For anxiety      . atorvastatin (LIPITOR) 40 MG tablet Take 1 tablet (40 mg total) by mouth daily at 6 PM.  30 tablet  0  . Cholecalciferol (VITAMIN D) 1000 UNITS capsule Take 1,000 Units by mouth daily.       . citalopram (CELEXA) 20 MG tablet Take 20 mg by mouth daily.      . collagenase (SANTYL) ointment Apply topically daily.  90 g  0  . docusate sodium (COLACE) 100 MG capsule Take 100 mg by mouth 2 (two) times daily as needed. For constipation       . doxycycline (VIBRAMYCIN) 50 MG capsule Take 2 capsules (100 mg total) by mouth 2 (two) times daily.  90 capsule  2  . furosemide (LASIX) 40 MG tablet Take  1 tablet (40 mg total) by mouth daily.  90 tablet  3  . hydrALAZINE (APRESOLINE) 50 MG tablet Take 50 mg by mouth 3 (three) times daily.      Marland Kitchen labetalol (NORMODYNE) 300 MG tablet Take 300 mg by mouth 2 (two) times daily.      Marland Kitchen levothyroxine (SYNTHROID, LEVOTHROID) 25 MCG tablet Take 25 mcg by mouth daily before breakfast.      . lidocaine (LIDODERM) 5 % Place 1 patch onto the skin daily. Remove & Discard patch within 12 hours or as directed by MD       . metFORMIN (GLUCOPHAGE) 500 MG tablet Take 500 mg by mouth daily with breakfast.      . senna (SENOKOT) 8.6 MG TABS Take 2 tablets by mouth daily as needed. For constipation       . sorbitol 70 % solution Take 25 mLs by mouth daily as needed. For constipation       .  warfarin (COUMADIN) 5 MG tablet Take 5-7.5 mg by mouth daily. Take 7.5mg  om Monday, Wednesday and Friday. Take 5mg  the rest of the week      . diltiazem (CARDIZEM CD) 120 MG 24 hr capsule Take 120 mg by mouth daily.        No current facility-administered medications on file prior to visit.    Review of Systems  Constitutional: Negative for unexpected weight change, or unusual diaphoresis  HENT: Negative for tinnitus.   Eyes: Negative for photophobia and visual disturbance.  Respiratory: Negative for choking and stridor.   Gastrointestinal: Negative for vomiting and blood in stool.  Genitourinary: Negative for hematuria and decreased urine volume.  Musculoskeletal: Negative for acute joint swelling Skin: Negative for color change and wound.  Neurological: Negative for tremors and numbness other than noted  Psychiatric/Behavioral: Negative for decreased concentration or  hyperactivity.       Objective:   Physical Exam BP 130/90  Pulse 89  Temp(Src) 99.5 F (37.5 C) (Oral)  Wt 189 lb 2 oz (85.787 kg)  BMI 33.51 kg/m2  SpO2 98% VS noted, fatigued in wheelwhair Constitutional: Pt appears well-developed and well-nourished.  HENT: Head: NCAT.  Right Ear: External ear normal.  Left Ear: External ear normal.  Eyes: Conjunctivae and EOM are normal. Pupils are equal, round, and reactive to light.  Neck: Normal range of motion. Neck supple.  Cardiovascular: Normal rate and irregular irreg rhythm.   Pulmonary/Chest: Effort normal and breath sounds normal.  Abd:  Soft, NT, non-distended, + BS Neurological: Pt is alert. Not confused  Skin: Skin is warm. No erythema. Bilat LE heavily bandaged Psychiatric: Pt behavior is normal. Thought content normal     Assessment & Plan:

## 2013-02-13 NOTE — Assessment & Plan Note (Signed)
stable overall by history and exam, recent data reviewed with pt, and pt to continue medical treatment as before,  to f/u any worsening symptoms or concerns Lab Results  Component Value Date   HGBA1C 6.8* 01/26/2013

## 2013-02-13 NOTE — Assessment & Plan Note (Signed)
stable overall by history and exam, recent data reviewed with pt, and pt to continue medical treatment as before,  to f/u any worsening symptoms or concerns BP Readings from Last 3 Encounters:  02/13/13 130/90  02/01/13 144/70  01/10/13 122/80

## 2013-02-13 NOTE — Assessment & Plan Note (Signed)
stable overall by history and exam, recent data reviewed with pt, and pt to continue medical treatment as before,  to f/u any worsening symptoms or concerns Lab Results  Component Value Date   WBC 5.9 01/31/2013   HGB 9.8* 01/31/2013   HCT 29.2* 01/31/2013   PLT 234 01/31/2013   GLUCOSE 117* 02/01/2013   CHOL 131 07/18/2012   TRIG 52.0 07/18/2012   HDL 61.80 07/18/2012   LDLDIRECT 136.2 06/24/2009   LDLCALC 59 07/18/2012   ALT 10 01/27/2013   AST 15 01/27/2013   NA 138 02/01/2013   K 3.4* 02/01/2013   CL 98 02/01/2013   CREATININE 1.28* 02/01/2013   BUN 22 02/01/2013   CO2 32 02/01/2013   TSH 1.10 01/11/2012   INR 5.0 02/13/2013   HGBA1C 6.8* 01/26/2013   MICROALBUR 23.6* 01/11/2012

## 2013-02-13 NOTE — Patient Instructions (Signed)
Please take all new medication as prescribed - the nausea medication as needed OK to increase the hydrocodone to 7.5/325 mg Please continue all other medications as before, and refills have been done if requested. Please have the pharmacy call with any other refills you may need. Please keep your appointments with your specialists as you have planned We can hold off on more blood tests today You should hear soon about having the INR done per [facility] at your home, and the hospital bed soon  Please remember to sign up for My Chart if you have not done so, as this will be important to you in the future with finding out test results, communicating by private email, and scheduling acute appointments online when needed.  Please return in 6 months, or sooner if needed

## 2013-02-13 NOTE — Assessment & Plan Note (Signed)
stable overall by history and exam, recent data reviewed with pt, and pt to continue medical treatment as before,  to f/u any worsening symptoms or concerns Lab Results  Component Value Date   LDLCALC 59 07/18/2012

## 2013-02-14 ENCOUNTER — Telehealth: Payer: Self-pay | Admitting: *Deleted

## 2013-02-14 NOTE — Telephone Encounter (Signed)
Bethann Berkshire from [facility] called requesting clarification on how long pts Coumadin is to be D/C'd and if PT/INR is to checked at home and how frequent.  Please advise

## 2013-02-14 NOTE — Telephone Encounter (Signed)
I would defer to our coumadin clinic

## 2013-02-15 ENCOUNTER — Telehealth: Payer: Self-pay | Admitting: *Deleted

## 2013-02-15 ENCOUNTER — Telehealth: Payer: Self-pay | Admitting: General Practice

## 2013-02-15 NOTE — Telephone Encounter (Signed)
Diane from [facility] called requests a bath aide twice a week for 4 weeks starting 8.25.2014.

## 2013-02-15 NOTE — Telephone Encounter (Signed)
Faxed orders to [facility]. 

## 2013-02-15 NOTE — Telephone Encounter (Signed)
HHRN informed 

## 2013-02-15 NOTE — Telephone Encounter (Signed)
Ok for verbal 

## 2013-02-16 ENCOUNTER — Telehealth: Payer: Self-pay | Admitting: Internal Medicine

## 2013-02-16 ENCOUNTER — Ambulatory Visit (INDEPENDENT_AMBULATORY_CARE_PROVIDER_SITE_OTHER): Payer: Medicare Other | Admitting: General Practice

## 2013-02-16 DIAGNOSIS — I4891 Unspecified atrial fibrillation: Secondary | ICD-10-CM

## 2013-02-16 DIAGNOSIS — I2789 Other specified pulmonary heart diseases: Secondary | ICD-10-CM

## 2013-02-16 LAB — POCT INR: INR: 4.1

## 2013-02-16 NOTE — Telephone Encounter (Signed)
Fredna Dow, PT with [facility], saw [name] Thursday 02/15/2013.  Patient complains of N&V, pain worse in legs, loss of appetite, elevated BP at 168/90, no blood seen in urine or stool, no acute distress, refused to go to hospital.  What should she do?

## 2013-02-16 NOTE — Telephone Encounter (Signed)
HHRN informed 

## 2013-02-16 NOTE — Telephone Encounter (Signed)
Cont to monitor for further n/v and possible dehydration, would need to be seen here or UC or ER if worse or unable to take po

## 2013-02-19 ENCOUNTER — Ambulatory Visit (INDEPENDENT_AMBULATORY_CARE_PROVIDER_SITE_OTHER): Payer: Self-pay | Admitting: General Practice

## 2013-02-19 DIAGNOSIS — I4891 Unspecified atrial fibrillation: Secondary | ICD-10-CM

## 2013-02-19 DIAGNOSIS — I2789 Other specified pulmonary heart diseases: Secondary | ICD-10-CM

## 2013-02-19 LAB — POCT INR: INR: 1.7

## 2013-02-20 ENCOUNTER — Telehealth: Payer: Self-pay | Admitting: *Deleted

## 2013-02-20 NOTE — Telephone Encounter (Signed)
Nikki from [facility] called requesting 4 additional weeks of Pt for patient.  Please advise

## 2013-02-20 NOTE — Telephone Encounter (Signed)
Ok Thx 

## 2013-02-21 NOTE — Telephone Encounter (Signed)
Spoke with Lowella Bandy advised of MDs note

## 2013-02-23 ENCOUNTER — Ambulatory Visit (INDEPENDENT_AMBULATORY_CARE_PROVIDER_SITE_OTHER): Payer: Medicare Other | Admitting: General Practice

## 2013-02-23 ENCOUNTER — Telehealth: Payer: Self-pay | Admitting: *Deleted

## 2013-02-23 DIAGNOSIS — I2789 Other specified pulmonary heart diseases: Secondary | ICD-10-CM

## 2013-02-23 DIAGNOSIS — I4891 Unspecified atrial fibrillation: Secondary | ICD-10-CM

## 2013-02-23 LAB — POCT INR: INR: 2.8

## 2013-02-23 NOTE — Telephone Encounter (Signed)
[name], pts daughter in law called states right now she and her husband live with the pt.  They are moving out in October, requesting what the options will be once they move out.  Please advise

## 2013-02-23 NOTE — Telephone Encounter (Signed)
Called the daughter [name] and she stated only [facility] comes out.  She did state that once she goes back home the patient is not even able to go to the door to let anyone in.  They are ok with [facility] coming out along with a social worker to determine what this patient may need.  Please advise on [facility] referral.

## 2013-02-23 NOTE — Telephone Encounter (Signed)
Ok for [facility] - for CHF, afib

## 2013-02-23 NOTE — Telephone Encounter (Signed)
Faxed referral form to [facility]

## 2013-02-23 NOTE — Telephone Encounter (Signed)
Does Ms Kenealy have Home Health and/or [facility] involved?  If not, would need this most likely  Also, may need NH placement, so I could ask for soc service with [facility] to see if the family agrees

## 2013-02-27 DIAGNOSIS — Z0279 Encounter for issue of other medical certificate: Secondary | ICD-10-CM

## 2013-02-28 ENCOUNTER — Telehealth: Payer: Self-pay | Admitting: *Deleted

## 2013-02-28 NOTE — Telephone Encounter (Signed)
Noted, ok for verbal if needed

## 2013-02-28 NOTE — Telephone Encounter (Signed)
Diane OT from [facility] called states the bath aide started this week twice weekly for 4 weeks instead of last week.

## 2013-03-01 NOTE — Telephone Encounter (Signed)
HHRN informed 

## 2013-03-02 ENCOUNTER — Ambulatory Visit (INDEPENDENT_AMBULATORY_CARE_PROVIDER_SITE_OTHER): Payer: Medicare Other | Admitting: General Practice

## 2013-03-02 DIAGNOSIS — I2789 Other specified pulmonary heart diseases: Secondary | ICD-10-CM

## 2013-03-02 DIAGNOSIS — I4891 Unspecified atrial fibrillation: Secondary | ICD-10-CM

## 2013-03-05 ENCOUNTER — Telehealth: Payer: Self-pay | Admitting: *Deleted

## 2013-03-05 NOTE — Telephone Encounter (Signed)
[name], pts son called concerned about pts right leg.  States right leg was draining this weekend.  He is requesting a return call from Dr Jonny Ruiz.  Please advise

## 2013-03-05 NOTE — Telephone Encounter (Signed)
Not sure what to say based on only this hx;  If no fever, worsening pain, swelling, redness, she likely can f/u with wound clinic, as she has been doing weekly.    [name] to let son know, and inquire as to when next wound clinic appt is scheduled

## 2013-03-06 NOTE — Telephone Encounter (Signed)
Informed the son and he informed she will be seen by wound clinic this week

## 2013-03-09 ENCOUNTER — Ambulatory Visit (INDEPENDENT_AMBULATORY_CARE_PROVIDER_SITE_OTHER): Payer: Medicare Other | Admitting: General Practice

## 2013-03-09 ENCOUNTER — Encounter (HOSPITAL_BASED_OUTPATIENT_CLINIC_OR_DEPARTMENT_OTHER): Payer: Medicare Other | Attending: General Surgery

## 2013-03-09 DIAGNOSIS — I4891 Unspecified atrial fibrillation: Secondary | ICD-10-CM

## 2013-03-09 DIAGNOSIS — L97809 Non-pressure chronic ulcer of other part of unspecified lower leg with unspecified severity: Secondary | ICD-10-CM | POA: Insufficient documentation

## 2013-03-09 DIAGNOSIS — I872 Venous insufficiency (chronic) (peripheral): Secondary | ICD-10-CM | POA: Insufficient documentation

## 2013-03-09 DIAGNOSIS — I87309 Chronic venous hypertension (idiopathic) without complications of unspecified lower extremity: Secondary | ICD-10-CM | POA: Insufficient documentation

## 2013-03-09 DIAGNOSIS — I2789 Other specified pulmonary heart diseases: Secondary | ICD-10-CM

## 2013-03-12 ENCOUNTER — Other Ambulatory Visit: Payer: Self-pay | Admitting: Internal Medicine

## 2013-03-16 ENCOUNTER — Ambulatory Visit (INDEPENDENT_AMBULATORY_CARE_PROVIDER_SITE_OTHER): Payer: Medicare Other | Admitting: General Practice

## 2013-03-16 DIAGNOSIS — I2789 Other specified pulmonary heart diseases: Secondary | ICD-10-CM

## 2013-03-16 DIAGNOSIS — I4891 Unspecified atrial fibrillation: Secondary | ICD-10-CM

## 2013-03-16 LAB — POCT INR: INR: 1.5

## 2013-03-23 ENCOUNTER — Ambulatory Visit (INDEPENDENT_AMBULATORY_CARE_PROVIDER_SITE_OTHER): Payer: Medicare Other | Admitting: General Practice

## 2013-03-23 DIAGNOSIS — I2789 Other specified pulmonary heart diseases: Secondary | ICD-10-CM

## 2013-03-23 DIAGNOSIS — I4891 Unspecified atrial fibrillation: Secondary | ICD-10-CM

## 2013-03-23 LAB — POCT INR: INR: 1.3

## 2013-03-24 ENCOUNTER — Other Ambulatory Visit: Payer: Self-pay | Admitting: Internal Medicine

## 2013-03-26 NOTE — Telephone Encounter (Signed)
Refill done.  

## 2013-03-27 ENCOUNTER — Telehealth: Payer: Self-pay | Admitting: *Deleted

## 2013-03-27 NOTE — Telephone Encounter (Signed)
Ok to see tomorrow if son thinks she needs to be seen

## 2013-03-27 NOTE — Telephone Encounter (Signed)
Fayrene Fearing pts son called concerned pt is not improving, states he believes pt is getting worse.  Please advise

## 2013-03-28 ENCOUNTER — Encounter (HOSPITAL_COMMUNITY): Payer: Self-pay | Admitting: Emergency Medicine

## 2013-03-28 ENCOUNTER — Emergency Department (HOSPITAL_COMMUNITY)
Admission: EM | Admit: 2013-03-28 | Discharge: 2013-03-28 | Disposition: A | Discharge status: Home / Self Care | Payer: Medicare Other | Attending: Emergency Medicine | Admitting: Emergency Medicine

## 2013-03-28 DIAGNOSIS — Z792 Long term (current) use of antibiotics: Secondary | ICD-10-CM | POA: Insufficient documentation

## 2013-03-28 DIAGNOSIS — E039 Hypothyroidism, unspecified: Secondary | ICD-10-CM | POA: Insufficient documentation

## 2013-03-28 DIAGNOSIS — Z8711 Personal history of peptic ulcer disease: Secondary | ICD-10-CM | POA: Insufficient documentation

## 2013-03-28 DIAGNOSIS — F329 Major depressive disorder, single episode, unspecified: Secondary | ICD-10-CM | POA: Insufficient documentation

## 2013-03-28 DIAGNOSIS — E785 Hyperlipidemia, unspecified: Secondary | ICD-10-CM | POA: Insufficient documentation

## 2013-03-28 DIAGNOSIS — F3289 Other specified depressive episodes: Secondary | ICD-10-CM | POA: Insufficient documentation

## 2013-03-28 DIAGNOSIS — Z862 Personal history of diseases of the blood and blood-forming organs and certain disorders involving the immune mechanism: Secondary | ICD-10-CM | POA: Insufficient documentation

## 2013-03-28 DIAGNOSIS — Z860100 Personal history of colon polyps, unspecified: Secondary | ICD-10-CM | POA: Insufficient documentation

## 2013-03-28 DIAGNOSIS — L97909 Non-pressure chronic ulcer of unspecified part of unspecified lower leg with unspecified severity: Secondary | ICD-10-CM | POA: Insufficient documentation

## 2013-03-28 DIAGNOSIS — Z8719 Personal history of other diseases of the digestive system: Secondary | ICD-10-CM | POA: Insufficient documentation

## 2013-03-28 DIAGNOSIS — Z79899 Other long term (current) drug therapy: Secondary | ICD-10-CM | POA: Insufficient documentation

## 2013-03-28 DIAGNOSIS — Z87891 Personal history of nicotine dependence: Secondary | ICD-10-CM | POA: Insufficient documentation

## 2013-03-28 DIAGNOSIS — Z7901 Long term (current) use of anticoagulants: Secondary | ICD-10-CM | POA: Insufficient documentation

## 2013-03-28 DIAGNOSIS — M1990 Unspecified osteoarthritis, unspecified site: Secondary | ICD-10-CM | POA: Insufficient documentation

## 2013-03-28 DIAGNOSIS — G894 Chronic pain syndrome: Secondary | ICD-10-CM | POA: Insufficient documentation

## 2013-03-28 DIAGNOSIS — E119 Type 2 diabetes mellitus without complications: Secondary | ICD-10-CM | POA: Insufficient documentation

## 2013-03-28 DIAGNOSIS — E669 Obesity, unspecified: Secondary | ICD-10-CM | POA: Insufficient documentation

## 2013-03-28 DIAGNOSIS — Z87448 Personal history of other diseases of urinary system: Secondary | ICD-10-CM | POA: Insufficient documentation

## 2013-03-28 DIAGNOSIS — Z8742 Personal history of other diseases of the female genital tract: Secondary | ICD-10-CM | POA: Insufficient documentation

## 2013-03-28 DIAGNOSIS — F411 Generalized anxiety disorder: Secondary | ICD-10-CM | POA: Insufficient documentation

## 2013-03-28 DIAGNOSIS — I10 Essential (primary) hypertension: Secondary | ICD-10-CM | POA: Insufficient documentation

## 2013-03-28 MED ORDER — HYDROCODONE-ACETAMINOPHEN 5-325 MG PO TABS
1.0000 | ORAL_TABLET | Freq: Once | ORAL | Status: AC
  Administered 2013-03-28: 1 via ORAL
  Filled 2013-03-28: qty 1

## 2013-03-28 NOTE — ED Provider Notes (Signed)
I saw and evaluated the patient, reviewed the resident's note and I agree with the findings and plan.  Patient with chronic wounds without signs of infection at this time. She is afebrile and vital signs are stable. No new treatment needed and will be discharged back to home  Toy Baker, MD 03/28/13 1546

## 2013-03-28 NOTE — ED Provider Notes (Signed)
CSN: [id number]     Arrival date & time 03/28/13  1443 History   None    Chief Complaint  Patient presents with  . Leg Pain   (Consider location/radiation/quality/duration/timing/severity/associated sxs/prior Treatment) Patient is a 79 y.o. female presenting with wound check. The history is provided by the patient, a caregiver and medical records.  Wound Check This is a chronic problem. The current episode started more than 1 month ago. The problem occurs constantly. The problem has been unchanged. Pertinent negatives include no abdominal pain, arthralgias, chest pain, chills, congestion, coughing, fatigue, fever, joint swelling, myalgias, nausea, numbness, rash, swollen glands, visual change, vomiting or weakness.    Past Medical History  Diagnosis Date  . Obesity   . Anemia, iron deficiency   . Depression   . Diabetes mellitus type II   . Hyperlipidemia   . Hypertension   . Peptic ulcer   . History of uterine fibroid   . Hx of colonic polyps   . Diverticula, colon   . Osteoarthritis   . Low back pain   . Atrial fibrillation     permanent, on coumadin (followed by [facility])  . Pulmonary hypertension     echo 4/12:  EF 55-60%, mod LVH, mild BAE, RVE and PASP 51  . Anxiety   . DIABETES MELLITUS, TYPE II 03/05/2007  . HYPERLIPIDEMIA 03/05/2007  . ANEMIA-IRON DEFICIENCY 03/05/2007  . HYPERTENSION 03/05/2007  . DEPRESSION 03/05/2007  . LOW BACK PAIN 03/05/2007  . PERIPHERAL EDEMA 04/29/2008  . Chronic pain syndrome 06/23/2010  . FIBROIDS, UTERUS 03/05/2007  . PEPTIC ULCER DISEASE 03/05/2007  . DIVERTICULOSIS, COLON 03/05/2007  . OSTEOARTHRITIS 03/05/2007  . COLONIC POLYPS, HX OF 03/05/2007  . Hypothyroidism 10/15/2010  . Renal insufficiency 10/15/2010   Past Surgical History  Procedure Laterality Date  . Laparoscopic hysterectomy     Family History  Problem Relation Age of Onset  . Hypertension Sister    History  Substance Use Topics  . Smoking status: Former Smoker    Start  date: 03/23/1972  . Smokeless tobacco: Not on file  . Alcohol Use: No   OB History   Grav Para Term Preterm Abortions TAB SAB Ect Mult Living                 Review of Systems  Constitutional: Negative for fever, chills and fatigue.  HENT: Negative for congestion.   Respiratory: Negative for cough.   Cardiovascular: Positive for leg swelling. Negative for chest pain.  Gastrointestinal: Negative for nausea, vomiting, abdominal pain, diarrhea and constipation.  Musculoskeletal: Negative for myalgias, joint swelling, arthralgias and gait problem.  Skin: Positive for wound (chronic). Negative for color change, pallor and rash.  Neurological: Negative for weakness and numbness.  All other systems reviewed and are negative.    Allergies  Clonidine derivatives  Home Medications   Current Outpatient Rx  Name  Route  Sig  Dispense  Refill  . ALPRAZolam (XANAX) 0.25 MG tablet   Oral   Take 0.25 mg by mouth 2 (two) times daily as needed. For anxiety         . Cholecalciferol (VITAMIN D) 1000 UNITS capsule   Oral   Take 1,000 Units by mouth daily.          . citalopram (CELEXA) 20 MG tablet   Oral   Take 20 mg by mouth daily.         . collagenase (SANTYL) ointment   Topical   Apply topically daily.  90 g   0   . diltiazem (CARDIZEM CD) 120 MG 24 hr capsule   Oral   Take 120 mg by mouth daily.         Marland Kitchen docusate sodium (COLACE) 100 MG capsule   Oral   Take 100 mg by mouth 2 (two) times daily as needed. For constipation          . furosemide (LASIX) 40 MG tablet   Oral   Take 1 tablet (40 mg total) by mouth daily.   90 tablet   3   . hydrALAZINE (APRESOLINE) 50 MG tablet   Oral   Take 50 mg by mouth 3 (three) times daily.         Marland Kitchen HYDROcodone-acetaminophen (NORCO) 7.5-325 MG per tablet   Oral   Take 1 tablet by mouth every 8 (eight) hours as needed for pain.   90 tablet   5   . labetalol (NORMODYNE) 300 MG tablet   Oral   Take 300 mg by mouth  2 (two) times daily.         Marland Kitchen levofloxacin (LEVAQUIN) 500 MG tablet   Oral   Take 500 mg by mouth daily.         Marland Kitchen levothyroxine (SYNTHROID, LEVOTHROID) 25 MCG tablet   Oral   Take 25 mcg by mouth daily before breakfast.         . lovastatin (MEVACOR) 40 MG tablet   Oral   Take 40 mg by mouth 2 (two) times daily.         . metFORMIN (GLUCOPHAGE) 500 MG tablet   Oral   Take 500 mg by mouth daily.          . ondansetron (ZOFRAN) 4 MG tablet   Oral   Take 1 tablet (4 mg total) by mouth every 8 (eight) hours as needed for nausea.   30 tablet   1   . warfarin (COUMADIN) 5 MG tablet   Oral   Take 5 mg by mouth daily. Take 7.5mg  on Tuesday, Thursday, Saturday and Sunday. Take 5mg  the rest of the week.          BP 156/84  Pulse 104  Temp(Src) 97.7 F (36.5 C) (Oral)  Resp 16  SpO2 100% Physical Exam  Nursing note and vitals reviewed. Constitutional: She is oriented to person, place, and time. She appears well-developed. No distress.  Chronically ill appearing female in NAD  HENT:  Head: Normocephalic and atraumatic.  Mouth/Throat: Oropharynx is clear and moist. No oropharyngeal exudate.  Eyes: Conjunctivae and EOM are normal. Pupils are equal, round, and reactive to light.  Neck: Normal range of motion. Neck supple.  Cardiovascular: Normal rate, regular rhythm and normal heart sounds.  Exam reveals no gallop and no friction rub.   No murmur heard. Pulmonary/Chest: Effort normal and breath sounds normal. No respiratory distress. She has no wheezes. She exhibits no tenderness.  Abdominal: Soft. Bowel sounds are normal. She exhibits no distension and no mass. There is no tenderness. There is no rebound and no guarding.  Musculoskeletal: Normal range of motion. She exhibits edema (of lower extremities to knee, non-pitting). She exhibits no tenderness.  Ulcers to bilateral anterior shins with healthy pink granulation tissue in place as well as clear drainage. No  purulent drainage and no surrounding erythema. Non-tender.  Lymphadenopathy:    She has no cervical adenopathy.  Neurological: She is alert and oriented to person, place, and time.  Skin: Skin is warm  and dry. She is not diaphoretic.    ED Course  Procedures (including critical care time) Labs Review Labs Reviewed - No data to display Imaging Review No results found.  MDM   1. Chronic ulcer of leg, unspecified laterality, with unspecified severity    History of hypertension, type 2 diabetes, chronic lower extremity ulcers who presents with wound drainage. She was seen by wound care a few days ago and had her bandages changed yesterday. Prior to that, she had not had her bandages changed in a week due to miscommunications with wound care. She at one point had feces on the bandages. She has been afebrile and the drainage has been clear and yellow. The drainage is not new. She has not had increasing edema or swelling. The home health nurse essentially saw her today, did not take down the bandages, and told her to come to the ED due to increased drainage.  On exam, wound are chronic appearing with healthy pink granulation tissue and no purulent drainage. Afebrile and VSS. No signs of wound infection and no erythema or swelling to suggest cellulitis or DVT. Feel that labs or imaging in this patient will not significantly aid treatment and can be foregone at this time. Patient has wound care follow up on Friday and PCP also has offered to see patient tomorrow as needed. Feel this is appropriate follow up in this patient with chronic leg ulcers and no signs of acute decompensation.  Discussed with the patient return precautions and need for follow up with PCP and wound care. Patient voiced understanding. Stable for d/c. This patient was discussed with my attending, Dr. Freida Busman.  Dorna Leitz, MD 03/28/13 [phone]

## 2013-03-28 NOTE — ED Notes (Signed)
[facility] called to transport patient home 

## 2013-03-28 NOTE — ED Notes (Signed)
Applied wet to dry dressing 4x4 gauze, abdominal pad, and gauze wrap. Patient tolerated without incident. Pain currently 7/10 achy. EDP notified .

## 2013-03-28 NOTE — ED Notes (Signed)
niece at bedside 

## 2013-03-28 NOTE — ED Notes (Signed)
MD at bedside. 

## 2013-03-28 NOTE — ED Notes (Signed)
Onset 2 months ago bilateral lower extremity ulcers. Seen by wound specialist and for the past month having clear to scan yellow drainage.  Increasing in drainage today.

## 2013-03-28 NOTE — Telephone Encounter (Signed)
Called the son informed of Md instructions.  He stated he is out of state at the time and that the patient was in need of more help at home at this time due to being able to get around.  Stated once in town he would call back and schedule appointment.

## 2013-03-30 ENCOUNTER — Encounter (HOSPITAL_BASED_OUTPATIENT_CLINIC_OR_DEPARTMENT_OTHER): Payer: Medicare Other | Attending: General Surgery

## 2013-03-30 DIAGNOSIS — L97909 Non-pressure chronic ulcer of unspecified part of unspecified lower leg with unspecified severity: Secondary | ICD-10-CM | POA: Insufficient documentation

## 2013-03-30 DIAGNOSIS — I87319 Chronic venous hypertension (idiopathic) with ulcer of unspecified lower extremity: Secondary | ICD-10-CM | POA: Insufficient documentation

## 2013-03-30 NOTE — ED Provider Notes (Signed)
I saw and evaluated the patient, reviewed the resident's note and I agree with the findings and plan.  Bekki Tavenner T Dotty Gonzalo, MD 03/30/13 0713 

## 2013-04-02 ENCOUNTER — Emergency Department (HOSPITAL_COMMUNITY): Payer: Medicare Other

## 2013-04-02 ENCOUNTER — Emergency Department (HOSPITAL_COMMUNITY)
Admission: EM | Admit: 2013-04-02 | Discharge: 2013-04-02 | Disposition: A | Discharge status: Home / Self Care | Payer: Medicare Other | Attending: Emergency Medicine | Admitting: Emergency Medicine

## 2013-04-02 ENCOUNTER — Encounter (HOSPITAL_COMMUNITY): Payer: Self-pay | Admitting: *Deleted

## 2013-04-02 DIAGNOSIS — G894 Chronic pain syndrome: Secondary | ICD-10-CM | POA: Insufficient documentation

## 2013-04-02 DIAGNOSIS — Z79899 Other long term (current) drug therapy: Secondary | ICD-10-CM | POA: Insufficient documentation

## 2013-04-02 DIAGNOSIS — F3289 Other specified depressive episodes: Secondary | ICD-10-CM | POA: Insufficient documentation

## 2013-04-02 DIAGNOSIS — Z862 Personal history of diseases of the blood and blood-forming organs and certain disorders involving the immune mechanism: Secondary | ICD-10-CM | POA: Insufficient documentation

## 2013-04-02 DIAGNOSIS — N390 Urinary tract infection, site not specified: Secondary | ICD-10-CM | POA: Insufficient documentation

## 2013-04-02 DIAGNOSIS — T07XXXA Unspecified multiple injuries, initial encounter: Secondary | ICD-10-CM | POA: Insufficient documentation

## 2013-04-02 DIAGNOSIS — Z87891 Personal history of nicotine dependence: Secondary | ICD-10-CM | POA: Insufficient documentation

## 2013-04-02 DIAGNOSIS — F329 Major depressive disorder, single episode, unspecified: Secondary | ICD-10-CM | POA: Insufficient documentation

## 2013-04-02 DIAGNOSIS — Z87448 Personal history of other diseases of urinary system: Secondary | ICD-10-CM | POA: Insufficient documentation

## 2013-04-02 DIAGNOSIS — Z8711 Personal history of peptic ulcer disease: Secondary | ICD-10-CM | POA: Insufficient documentation

## 2013-04-02 DIAGNOSIS — W19XXXA Unspecified fall, initial encounter: Secondary | ICD-10-CM

## 2013-04-02 DIAGNOSIS — E669 Obesity, unspecified: Secondary | ICD-10-CM | POA: Insufficient documentation

## 2013-04-02 DIAGNOSIS — Y93E1 Activity, personal bathing and showering: Secondary | ICD-10-CM | POA: Insufficient documentation

## 2013-04-02 DIAGNOSIS — Z8742 Personal history of other diseases of the female genital tract: Secondary | ICD-10-CM | POA: Insufficient documentation

## 2013-04-02 DIAGNOSIS — Z7901 Long term (current) use of anticoagulants: Secondary | ICD-10-CM | POA: Insufficient documentation

## 2013-04-02 DIAGNOSIS — F411 Generalized anxiety disorder: Secondary | ICD-10-CM | POA: Insufficient documentation

## 2013-04-02 DIAGNOSIS — E785 Hyperlipidemia, unspecified: Secondary | ICD-10-CM | POA: Insufficient documentation

## 2013-04-02 DIAGNOSIS — Z860100 Personal history of colon polyps, unspecified: Secondary | ICD-10-CM | POA: Insufficient documentation

## 2013-04-02 DIAGNOSIS — I10 Essential (primary) hypertension: Secondary | ICD-10-CM | POA: Insufficient documentation

## 2013-04-02 DIAGNOSIS — Z8719 Personal history of other diseases of the digestive system: Secondary | ICD-10-CM | POA: Insufficient documentation

## 2013-04-02 DIAGNOSIS — Z792 Long term (current) use of antibiotics: Secondary | ICD-10-CM | POA: Insufficient documentation

## 2013-04-02 DIAGNOSIS — E039 Hypothyroidism, unspecified: Secondary | ICD-10-CM | POA: Insufficient documentation

## 2013-04-02 DIAGNOSIS — M79604 Pain in right leg: Secondary | ICD-10-CM

## 2013-04-02 DIAGNOSIS — M1990 Unspecified osteoarthritis, unspecified site: Secondary | ICD-10-CM | POA: Insufficient documentation

## 2013-04-02 DIAGNOSIS — I4891 Unspecified atrial fibrillation: Secondary | ICD-10-CM | POA: Insufficient documentation

## 2013-04-02 DIAGNOSIS — W07XXXA Fall from chair, initial encounter: Secondary | ICD-10-CM | POA: Insufficient documentation

## 2013-04-02 DIAGNOSIS — Y92009 Unspecified place in unspecified non-institutional (private) residence as the place of occurrence of the external cause: Secondary | ICD-10-CM | POA: Insufficient documentation

## 2013-04-02 DIAGNOSIS — S0990XA Unspecified injury of head, initial encounter: Secondary | ICD-10-CM | POA: Insufficient documentation

## 2013-04-02 DIAGNOSIS — E119 Type 2 diabetes mellitus without complications: Secondary | ICD-10-CM | POA: Insufficient documentation

## 2013-04-02 LAB — URINE MICROSCOPIC-ADD ON

## 2013-04-02 LAB — COMPREHENSIVE METABOLIC PANEL
ALT: 29 U/L (ref 0–35)
AST: 44 U/L — ABNORMAL HIGH (ref 0–37)
Albumin: 3.1 g/dL — ABNORMAL LOW (ref 3.5–5.2)
Calcium: 9.4 mg/dL (ref 8.4–10.5)
Chloride: 107 mEq/L (ref 96–112)
Creatinine, Ser: 1.36 mg/dL — ABNORMAL HIGH (ref 0.50–1.10)
Sodium: 136 mEq/L (ref 135–145)
Total Bilirubin: 0.4 mg/dL (ref 0.3–1.2)
Total Protein: 6.9 g/dL (ref 6.0–8.3)

## 2013-04-02 LAB — URINALYSIS, ROUTINE W REFLEX MICROSCOPIC
Nitrite: NEGATIVE
Specific Gravity, Urine: 1.025 (ref 1.005–1.030)
Urobilinogen, UA: 0.2 mg/dL (ref 0.0–1.0)
pH: 5 (ref 5.0–8.0)

## 2013-04-02 LAB — CBC WITH DIFFERENTIAL/PLATELET
Basophils Absolute: 0 10*3/uL (ref 0.0–0.1)
Basophils Relative: 0 % (ref 0–1)
Eosinophils Absolute: 0.1 10*3/uL (ref 0.0–0.7)
Eosinophils Relative: 2 % (ref 0–5)
HCT: 30.9 % — ABNORMAL LOW (ref 36.0–46.0)
MCH: 30.8 pg (ref 26.0–34.0)
MCHC: 33.7 g/dL (ref 30.0–36.0)
Monocytes Absolute: 0.4 10*3/uL (ref 0.1–1.0)
Neutro Abs: 4.2 10*3/uL (ref 1.7–7.7)
RBC: 3.38 MIL/uL — ABNORMAL LOW (ref 3.87–5.11)
RDW: 16.1 % — ABNORMAL HIGH (ref 11.5–15.5)

## 2013-04-02 LAB — POCT I-STAT, CHEM 8
Chloride: 111 mEq/L (ref 96–112)
HCT: 30 % — ABNORMAL LOW (ref 36.0–46.0)
Hemoglobin: 10.2 g/dL — ABNORMAL LOW (ref 12.0–15.0)
Potassium: 5.2 mEq/L — ABNORMAL HIGH (ref 3.5–5.1)
Sodium: 142 mEq/L (ref 135–145)

## 2013-04-02 LAB — PROTIME-INR
INR: 3.08 — ABNORMAL HIGH (ref 0.00–1.49)
Prothrombin Time: 30.7 seconds — ABNORMAL HIGH (ref 11.6–15.2)

## 2013-04-02 MED ORDER — CEFPODOXIME PROXETIL 100 MG PO TABS: 100.0000 mg | ORAL_TABLET | Freq: Two times a day (BID) | ORAL | Status: DC

## 2013-04-02 MED ORDER — HYDROMORPHONE HCL PF 1 MG/ML IJ SOLN
1.0000 mg | Freq: Once | INTRAMUSCULAR | Status: AC
  Administered 2013-04-02: 1 mg via INTRAVENOUS
  Filled 2013-04-02: qty 1

## 2013-04-02 NOTE — ED Provider Notes (Signed)
CSN: [id number]     Arrival date & time 04/02/13  1057 History   First MD Initiated Contact with Patient 04/02/13 1108     Chief Complaint  Patient presents with  . Fall   (Consider location/radiation/quality/duration/timing/severity/associated sxs/prior Treatment) HPI Comments: Patient is a 79 year old female past medical history significant for DM, HLD, HTN, A. fib consenting to the emergency department after a fall earlier today. According to patient and family, patient was on portable bathroom chair cleaning up when she felt her legs get weak and she fell onto the ground. She states she hit her head on the trashcan. Patient is denying any pain at this time. She denies any LOC. Patient denies any chest pain, shortness breath, nausea, vomiting, abdominal pain prior to fall or after fall. Patient is on Coumadin. Family states that patient has baseline right-sided weakness for some time. Patient did not take any of her home medications this morning.    Past Medical History  Diagnosis Date  . Obesity   . Anemia, iron deficiency   . Depression   . Diabetes mellitus type II   . Hyperlipidemia   . Hypertension   . Peptic ulcer   . History of uterine fibroid   . Hx of colonic polyps   . Diverticula, colon   . Osteoarthritis   . Low back pain   . Atrial fibrillation     permanent, on coumadin (followed by [facility])  . Pulmonary hypertension     echo 4/12:  EF 55-60%, mod LVH, mild BAE, RVE and PASP 51  . Anxiety   . DIABETES MELLITUS, TYPE II 03/05/2007  . HYPERLIPIDEMIA 03/05/2007  . ANEMIA-IRON DEFICIENCY 03/05/2007  . HYPERTENSION 03/05/2007  . DEPRESSION 03/05/2007  . LOW BACK PAIN 03/05/2007  . PERIPHERAL EDEMA 04/29/2008  . Chronic pain syndrome 06/23/2010  . FIBROIDS, UTERUS 03/05/2007  . PEPTIC ULCER DISEASE 03/05/2007  . DIVERTICULOSIS, COLON 03/05/2007  . OSTEOARTHRITIS 03/05/2007  . COLONIC POLYPS, HX OF 03/05/2007  . Hypothyroidism 10/15/2010  . Renal insufficiency 10/15/2010  .  Atrial fibrillation    Past Surgical History  Procedure Laterality Date  . Laparoscopic hysterectomy     Family History  Problem Relation Age of Onset  . Hypertension Sister    History  Substance Use Topics  . Smoking status: Former Smoker    Start date: 03/23/1972  . Smokeless tobacco: Not on file  . Alcohol Use: No   OB History   Grav Para Term Preterm Abortions TAB SAB Ect Mult Living                 Review of Systems  Constitutional: Negative for fever and chills.  HENT: Negative for neck pain.   Eyes: Negative for visual disturbance.  Respiratory: Negative for shortness of breath.   Cardiovascular: Positive for leg swelling (chronic). Negative for chest pain.  Gastrointestinal: Negative for nausea, vomiting and abdominal pain.  Genitourinary: Negative for flank pain.  Skin: Positive for wound (chronic vascular wounds).  Neurological: Negative for syncope and headaches.  Hematological: Bruises/bleeds easily (On Coumadin).    Allergies  Clonidine derivatives  Home Medications   Current Outpatient Rx  Name  Route  Sig  Dispense  Refill  . ALPRAZolam (XANAX) 0.25 MG tablet   Oral   Take 0.25 mg by mouth 2 (two) times daily as needed. For anxiety         . Cholecalciferol (VITAMIN D) 1000 UNITS capsule   Oral   Take 1,000 Units  by mouth daily.          . citalopram (CELEXA) 20 MG tablet   Oral   Take 20 mg by mouth daily.         Marland Kitchen diltiazem (CARDIZEM CD) 120 MG 24 hr capsule   Oral   Take 120 mg by mouth daily.         Marland Kitchen docusate sodium (COLACE) 100 MG capsule   Oral   Take 100 mg by mouth 2 (two) times daily as needed. For constipation          . furosemide (LASIX) 40 MG tablet   Oral   Take 1 tablet (40 mg total) by mouth daily.   90 tablet   3   . HYDROcodone-acetaminophen (NORCO/VICODIN) 5-325 MG per tablet   Oral   Take 1 tablet by mouth every 8 (eight) hours as needed for pain.         Marland Kitchen labetalol (NORMODYNE) 300 MG tablet    Oral   Take 300 mg by mouth 2 (two) times daily.         Marland Kitchen levothyroxine (SYNTHROID, LEVOTHROID) 25 MCG tablet   Oral   Take 25 mcg by mouth daily before breakfast.         . lovastatin (MEVACOR) 40 MG tablet   Oral   Take 40 mg by mouth 2 (two) times daily.         . metFORMIN (GLUCOPHAGE) 500 MG tablet   Oral   Take 500 mg by mouth daily.          . ondansetron (ZOFRAN) 4 MG tablet   Oral   Take 1 tablet (4 mg total) by mouth every 8 (eight) hours as needed for nausea.   30 tablet   1   . warfarin (COUMADIN) 5 MG tablet   Oral   Take 5 mg by mouth daily. Take 7.5mg  on Tuesday, Thursday, Saturday and Sunday. Take 5mg  the rest of the week.         . cefpodoxime (VANTIN) 100 MG tablet   Oral   Take 1 tablet (100 mg total) by mouth 2 (two) times daily.   14 tablet   0    BP 154/89  Pulse 98  Temp(Src) 98.1 F (36.7 C) (Rectal)  Resp 15  SpO2 99% Physical Exam  Constitutional: She is oriented to person, place, and time. She appears well-developed and well-nourished. No distress.  HENT:  Head: Normocephalic and atraumatic.  Right Ear: External ear normal.  Left Ear: External ear normal.  Nose: Nose normal.  Eyes: Conjunctivae and EOM are normal. Pupils are equal, round, and reactive to light.  Neck: Normal range of motion. Neck supple.  Cardiovascular: Normal rate, regular rhythm, normal heart sounds and intact distal pulses.   Pulmonary/Chest: Effort normal and breath sounds normal. No respiratory distress.  Abdominal: Soft. Bowel sounds are normal. She exhibits no distension. There is no tenderness. There is no rebound and no guarding.  Lymphadenopathy:    She has no cervical adenopathy.  Neurological: She is alert and oriented to person, place, and time. No cranial nerve deficit or sensory deficit. GCS eye subscore is 4. GCS verbal subscore is 5. GCS motor subscore is 6.  Strength at baseline per pt and family. Baseline right sided UE weakness.   Skin:  Skin is warm and dry. She is not diaphoretic.  Bilateral LE wrapped.   Psychiatric: She has a normal mood and affect.    ED Course  Procedures (including critical care time) Labs Review Labs Reviewed  URINALYSIS, ROUTINE W REFLEX MICROSCOPIC - Abnormal; Notable for the following:    APPearance CLOUDY (*)    Hgb urine dipstick SMALL (*)    Bilirubin Urine SMALL (*)    Protein, ur 100 (*)    Leukocytes, UA MODERATE (*)    All other components within normal limits  CBC WITH DIFFERENTIAL - Abnormal; Notable for the following:    RBC 3.38 (*)    Hemoglobin 10.4 (*)    HCT 30.9 (*)    RDW 16.1 (*)    Neutrophils Relative % 82 (*)    Lymphocytes Relative 10 (*)    Lymphs Abs 0.5 (*)    All other components within normal limits  COMPREHENSIVE METABOLIC PANEL - Abnormal; Notable for the following:    Potassium 7.0 (*)    CO2 17 (*)    Glucose, Bld 137 (*)    BUN 28 (*)    Creatinine, Ser 1.36 (*)    Albumin 3.1 (*)    AST 44 (*)    GFR calc non Af Amer 36 (*)    GFR calc Af Amer 42 (*)    All other components within normal limits  PROTIME-INR - Abnormal; Notable for the following:    Prothrombin Time 30.7 (*)    INR 3.08 (*)    All other components within normal limits  URINE MICROSCOPIC-ADD ON - Abnormal; Notable for the following:    Squamous Epithelial / LPF FEW (*)    Bacteria, UA FEW (*)    Casts HYALINE CASTS (*)    All other components within normal limits  POCT I-STAT, CHEM 8 - Abnormal; Notable for the following:    Potassium 5.2 (*)    BUN 28 (*)    Creatinine, Ser 1.70 (*)    Glucose, Bld 118 (*)    Calcium, Ion 1.37 (*)    Hemoglobin 10.2 (*)    HCT 30.0 (*)    All other components within normal limits  URINE CULTURE  TROPONIN I   Imaging Review Ct Head Wo Contrast  04/02/2013   CLINICAL DATA:  Larey Seat in bathtub  EXAM: CT HEAD WITHOUT CONTRAST  CT CERVICAL SPINE WITHOUT CONTRAST  TECHNIQUE: Multidetector CT imaging of the head and cervical spine was  performed following the standard protocol without intravenous contrast. Multiplanar CT image reconstructions of the cervical spine were also generated.  COMPARISON:  12/19/2012  FINDINGS: CT HEAD FINDINGS  Chronic atrophy and chronic small vessel ischemic change in the deep white matter. Chronic subcentimeter lacunar infarct left basal ganglia and bilateral thalamus. No hemorrhage or extra-axial fluid. No skull fracture.  CT CERVICAL SPINE FINDINGS  No fracture. Near normal anterior-posterior alignment with mild multilevel listhesis due to severe degenerative change. There is in particular is severe degenerative disc disease throughout the cervical spine. There is no prevertebral soft tissue swelling.  IMPRESSION: CT HEAD IMPRESSION  No acute traumatic injury  CT CERVICAL SPINE IMPRESSION  Severe chronic degenerative change. No acute traumatic injury   Electronically Signed   By: Esperanza Heir M.D.   On: 04/02/2013 14:41   Ct Cervical Spine Wo Contrast  04/02/2013   CLINICAL DATA:  Larey Seat in bathtub  EXAM: CT HEAD WITHOUT CONTRAST  CT CERVICAL SPINE WITHOUT CONTRAST  TECHNIQUE: Multidetector CT imaging of the head and cervical spine was performed following the standard protocol without intravenous contrast. Multiplanar CT image reconstructions of the cervical spine were also generated.  COMPARISON:  12/19/2012  FINDINGS: CT HEAD FINDINGS  Chronic atrophy and chronic small vessel ischemic change in the deep white matter. Chronic subcentimeter lacunar infarct left basal ganglia and bilateral thalamus. No hemorrhage or extra-axial fluid. No skull fracture.  CT CERVICAL SPINE FINDINGS  No fracture. Near normal anterior-posterior alignment with mild multilevel listhesis due to severe degenerative change. There is in particular is severe degenerative disc disease throughout the cervical spine. There is no prevertebral soft tissue swelling.  IMPRESSION: CT HEAD IMPRESSION  No acute traumatic injury  CT CERVICAL SPINE  IMPRESSION  Severe chronic degenerative change. No acute traumatic injury   Electronically Signed   By: Esperanza Heir M.D.   On: 04/02/2013 14:41    Date: 04/02/2013  Rate: 122  Rhythm: atrial fibrillation  QRS Axis: left  Intervals: normal  ST/T Wave abnormalities: nonspecific ST/T changes  Conduction Disutrbances:right bundle branch block  Narrative Interpretation:   Old EKG Reviewed: unchanged    MDM   1. Fall at home, initial encounter   2. Bilateral lower extremity pain   3. Atrial fibrillation   4. UTI (urinary tract infection)     Afebrile, NAD, non-toxic appearing, AAOx4. No neurofocal deficits from baseline per patient. No signs of head trauma.   1) Fall: Fall likely mechanical. Patient with wrapped bilateral lower extremities for wound management and chronic peripheral edema. I have reviewed nursing notes, vital signs, and all appropriate lab and imaging results for this patient. EKG without acute abnormality. Patient did not take any medications prior to arrival likely cause of tachycardia.  2) UTI: Urine was obtained with catheterization, multiple squamous cells catch, will go ahead and prophylactically treat patient for urinary tract infection. Culture sent.  Advised PCP f/u.    At this time there does not appear to be any evidence of an acute emergency medical condition and the patient appears stable for discharge with appropriate outpatient follow up.Diagnosis was discussed with patient who verbalizes understanding and is agreeable to discharge. Pt case discussed with Dr. Freida Busman who agrees with my plan.          Jeannetta Ellis, PA-C 04/02/13 1931

## 2013-04-02 NOTE — ED Notes (Signed)
Pt bed wet and urine sample unable to get with in and out.blankets and sheets changed.back care given and pt position and turned.Skin peel present in the sacral area.

## 2013-04-02 NOTE — ED Provider Notes (Signed)
Medical screening examination/treatment/procedure(s) were conducted as a shared visit with non-physician practitioner(s) and myself.  I personally evaluated the patient during the encounter  Patient here after having a fall while getting up from a commode. Normal patient uses a walker. No loss of consciousness. He became weak. Denies any recent illnesses within the last 24 hours. Will check labs and x-rays and likely discharged home. No focal neurological deficits at this time  Toy Baker, MD 04/02/13 1314

## 2013-04-02 NOTE — ED Notes (Signed)
multiple attempts to get pt urine sample with catheter. Unable. Pt currently on bedpan.

## 2013-04-02 NOTE — ED Notes (Signed)
Pt was taking a spongebath got up and legs gave out on her causing her to fall in the water and head went into trash can.  NO loc.  Pt is on coumadin.  Denies any pain and follows commands

## 2013-04-02 NOTE — ED Notes (Signed)
[facility] being called for pt transport back home

## 2013-04-03 LAB — URINE CULTURE
Colony Count: NO GROWTH
Culture: NO GROWTH

## 2013-04-03 NOTE — ED Provider Notes (Signed)
Medical screening examination/treatment/procedure(s) were conducted as a shared visit with non-physician practitioner(s) and myself.  I personally evaluated the patient during the encounter  Toy Baker, MD 04/03/13 2332

## 2013-04-04 ENCOUNTER — Telehealth: Payer: Self-pay | Admitting: Internal Medicine

## 2013-04-04 NOTE — Telephone Encounter (Signed)
10.8.2014   Pt is unable to work anymore to be able to come to doctors visits, please contact Jaquita Folds in regards to this matter so she knows what steps to take in regards to pts future care plan.

## 2013-04-04 NOTE — Telephone Encounter (Signed)
Called [facility], completed form put on your desk for reason for referral.  They will try and do what they can to help this patient.

## 2013-04-04 NOTE — Telephone Encounter (Signed)
I dont know how to address this social issue  Can we refer to [facility]?

## 2013-04-05 ENCOUNTER — Emergency Department (HOSPITAL_COMMUNITY)
Admission: EM | Admit: 2013-04-05 | Discharge: 2013-04-05 | Disposition: A | Discharge status: Home / Self Care | Payer: Medicare Other | Attending: Emergency Medicine | Admitting: Emergency Medicine

## 2013-04-05 ENCOUNTER — Telehealth: Payer: Self-pay | Admitting: *Deleted

## 2013-04-05 ENCOUNTER — Encounter (HOSPITAL_COMMUNITY): Payer: Self-pay | Admitting: Emergency Medicine

## 2013-04-05 ENCOUNTER — Ambulatory Visit: Payer: Medicare Other | Admitting: Internal Medicine

## 2013-04-05 ENCOUNTER — Emergency Department (HOSPITAL_COMMUNITY): Payer: Medicare Other

## 2013-04-05 DIAGNOSIS — I872 Venous insufficiency (chronic) (peripheral): Secondary | ICD-10-CM

## 2013-04-05 DIAGNOSIS — G894 Chronic pain syndrome: Secondary | ICD-10-CM | POA: Insufficient documentation

## 2013-04-05 DIAGNOSIS — Z87891 Personal history of nicotine dependence: Secondary | ICD-10-CM | POA: Insufficient documentation

## 2013-04-05 DIAGNOSIS — L02419 Cutaneous abscess of limb, unspecified: Secondary | ICD-10-CM

## 2013-04-05 DIAGNOSIS — E119 Type 2 diabetes mellitus without complications: Secondary | ICD-10-CM

## 2013-04-05 DIAGNOSIS — Z79899 Other long term (current) drug therapy: Secondary | ICD-10-CM | POA: Insufficient documentation

## 2013-04-05 DIAGNOSIS — R791 Abnormal coagulation profile: Secondary | ICD-10-CM | POA: Insufficient documentation

## 2013-04-05 DIAGNOSIS — E039 Hypothyroidism, unspecified: Secondary | ICD-10-CM | POA: Insufficient documentation

## 2013-04-05 DIAGNOSIS — F99 Mental disorder, not otherwise specified: Secondary | ICD-10-CM | POA: Insufficient documentation

## 2013-04-05 DIAGNOSIS — I4891 Unspecified atrial fibrillation: Secondary | ICD-10-CM | POA: Insufficient documentation

## 2013-04-05 DIAGNOSIS — Z888 Allergy status to other drugs, medicaments and biological substances status: Secondary | ICD-10-CM | POA: Insufficient documentation

## 2013-04-05 DIAGNOSIS — R531 Weakness: Secondary | ICD-10-CM

## 2013-04-05 DIAGNOSIS — Z8742 Personal history of other diseases of the female genital tract: Secondary | ICD-10-CM | POA: Insufficient documentation

## 2013-04-05 DIAGNOSIS — I2789 Other specified pulmonary heart diseases: Secondary | ICD-10-CM | POA: Insufficient documentation

## 2013-04-05 DIAGNOSIS — F3289 Other specified depressive episodes: Secondary | ICD-10-CM | POA: Insufficient documentation

## 2013-04-05 DIAGNOSIS — M6281 Muscle weakness (generalized): Secondary | ICD-10-CM | POA: Insufficient documentation

## 2013-04-05 DIAGNOSIS — Z8719 Personal history of other diseases of the digestive system: Secondary | ICD-10-CM | POA: Insufficient documentation

## 2013-04-05 DIAGNOSIS — W19XXXA Unspecified fall, initial encounter: Secondary | ICD-10-CM

## 2013-04-05 DIAGNOSIS — M1990 Unspecified osteoarthritis, unspecified site: Secondary | ICD-10-CM | POA: Insufficient documentation

## 2013-04-05 DIAGNOSIS — E785 Hyperlipidemia, unspecified: Secondary | ICD-10-CM | POA: Insufficient documentation

## 2013-04-05 DIAGNOSIS — L97909 Non-pressure chronic ulcer of unspecified part of unspecified lower leg with unspecified severity: Secondary | ICD-10-CM

## 2013-04-05 DIAGNOSIS — Z7901 Long term (current) use of anticoagulants: Secondary | ICD-10-CM | POA: Insufficient documentation

## 2013-04-05 DIAGNOSIS — D509 Iron deficiency anemia, unspecified: Secondary | ICD-10-CM | POA: Insufficient documentation

## 2013-04-05 DIAGNOSIS — L03119 Cellulitis of unspecified part of limb: Secondary | ICD-10-CM

## 2013-04-05 DIAGNOSIS — E669 Obesity, unspecified: Secondary | ICD-10-CM | POA: Insufficient documentation

## 2013-04-05 DIAGNOSIS — F329 Major depressive disorder, single episode, unspecified: Secondary | ICD-10-CM | POA: Insufficient documentation

## 2013-04-05 LAB — POCT I-STAT, CHEM 8
Chloride: 111 mEq/L (ref 96–112)
HCT: 29 % — ABNORMAL LOW (ref 36.0–46.0)
Hemoglobin: 9.9 g/dL — ABNORMAL LOW (ref 12.0–15.0)
Potassium: 5.3 mEq/L — ABNORMAL HIGH (ref 3.5–5.1)
Sodium: 141 mEq/L (ref 135–145)

## 2013-04-05 LAB — PROTIME-INR: INR: 3.69 — ABNORMAL HIGH (ref 0.00–1.49)

## 2013-04-05 LAB — DIFFERENTIAL
Basophils Relative: 1 % (ref 0–1)
Eosinophils Absolute: 0.2 10*3/uL (ref 0.0–0.7)
Eosinophils Relative: 3 % (ref 0–5)
Lymphocytes Relative: 10 % — ABNORMAL LOW (ref 12–46)
Monocytes Absolute: 0.3 10*3/uL (ref 0.1–1.0)
Monocytes Relative: 5 % (ref 3–12)
Neutrophils Relative %: 81 % — ABNORMAL HIGH (ref 43–77)

## 2013-04-05 LAB — CBC
HCT: 27.5 % — ABNORMAL LOW (ref 36.0–46.0)
Hemoglobin: 9 g/dL — ABNORMAL LOW (ref 12.0–15.0)
MCH: 30.6 pg (ref 26.0–34.0)
MCHC: 32.7 g/dL (ref 30.0–36.0)
MCV: 93.5 fL (ref 78.0–100.0)
WBC: 5.3 10*3/uL (ref 4.0–10.5)

## 2013-04-05 LAB — COMPREHENSIVE METABOLIC PANEL
Albumin: 3 g/dL — ABNORMAL LOW (ref 3.5–5.2)
BUN: 35 mg/dL — ABNORMAL HIGH (ref 6–23)
Calcium: 9.3 mg/dL (ref 8.4–10.5)
Creatinine, Ser: 1.88 mg/dL — ABNORMAL HIGH (ref 0.50–1.10)
GFR calc Af Amer: 28 mL/min — ABNORMAL LOW (ref >90)
Sodium: 140 mEq/L (ref 135–145)
Total Bilirubin: 0.3 mg/dL (ref 0.3–1.2)
Total Protein: 6.1 g/dL (ref 6.0–8.3)

## 2013-04-05 LAB — GLUCOSE, CAPILLARY: Glucose-Capillary: 190 mg/dL — ABNORMAL HIGH (ref 70–99)

## 2013-04-05 LAB — TROPONIN I: Troponin I: <0.3 ng/mL (ref <0.30)

## 2013-04-05 MED ORDER — OXYCODONE-ACETAMINOPHEN 5-325 MG PO TABS
2.0000 | ORAL_TABLET | Freq: Once | ORAL | Status: AC
  Administered 2013-04-05: 2 via ORAL
  Filled 2013-04-05: qty 2

## 2013-04-05 MED ORDER — SODIUM CHLORIDE 0.9 % IV BOLUS (SEPSIS)
1000.0000 mL | Freq: Once | INTRAVENOUS | Status: AC
  Administered 2013-04-05: 1000 mL via INTRAVENOUS

## 2013-04-05 NOTE — ED Provider Notes (Signed)
CSN: [id number]     Arrival date & time 04/05/13  1511 History   First MD Initiated Contact with Patient 04/05/13 1520     Chief Complaint  Patient presents with  . Stroke Symptoms   (Consider location/radiation/quality/duration/timing/severity/associated sxs/prior Treatment) The history is provided by the patient.  [name] is a 79 y.o. female history of depression, HTN, HL here with right hand weakness. She fell 3 days ago and had a normal CT head and cervical spine. She was diagnosed with possible UTI but urine culture was normal. Was sent home but as per family she is still weak on the right hand. Also increased weakness in the legs as well. At baseline she walks with a walker. She is on Coumadin for A. Fib. Denies fevers or chills or chest pain or abdominal pain.    Past Medical History  Diagnosis Date  . Obesity   . Anemia, iron deficiency   . Depression   . Diabetes mellitus type II   . Hypertension   . Peptic ulcer   . History of uterine fibroid   . Hx of colonic polyps   . Diverticula, colon   . Osteoarthritis   . Low back pain   . Atrial fibrillation     permanent, on coumadin (followed by [facility] coumadin clinic)  . Pulmonary hypertension     echo 4/12:  EF 55-60%, mod LVH, mild BAE, RVE and PASP 51  . Anxiety   . DIABETES MELLITUS, TYPE II 03/05/2007  . HYPERLIPIDEMIA 03/05/2007  . ANEMIA-IRON DEFICIENCY 03/05/2007  . HYPERTENSION 03/05/2007  . DEPRESSION 03/05/2007  . LOW BACK PAIN 03/05/2007  . PERIPHERAL EDEMA 04/29/2008  . Chronic pain syndrome 06/23/2010  . FIBROIDS, UTERUS 03/05/2007  . PEPTIC ULCER DISEASE 03/05/2007  . DIVERTICULOSIS, COLON 03/05/2007  . OSTEOARTHRITIS 03/05/2007  . COLONIC POLYPS, HX OF 03/05/2007  . Hypothyroidism 10/15/2010  . Renal insufficiency 10/15/2010  . Atrial fibrillation    Past Surgical History  Procedure Laterality Date  . Laparoscopic hysterectomy     Family History  Problem Relation Age of Onset  . Hypertension Sister    History   Substance Use Topics  . Smoking status: Former Smoker    Start date: 03/23/1972  . Smokeless tobacco: Not on file  . Alcohol Use: No   OB History   Grav Para Term Preterm Abortions TAB SAB Ect Mult Living                 Review of Systems  Neurological: Positive for weakness.  All other systems reviewed and are negative.    Allergies  Clonidine derivatives  Home Medications   Current Outpatient Rx  Name  Route  Sig  Dispense  Refill  . ALPRAZolam (XANAX) 0.25 MG tablet   Oral   Take 0.25 mg by mouth 2 (two) times daily as needed. For anxiety         . Cholecalciferol (VITAMIN D) 1000 UNITS capsule   Oral   Take 1,000 Units by mouth daily.          . citalopram (CELEXA) 20 MG tablet   Oral   Take 20 mg by mouth daily.         . collagenase (SANTYL) ointment   Topical   Apply 1 application topically daily.         Marland Kitchen diltiazem (CARDIZEM CD) 120 MG 24 hr capsule   Oral   Take 120 mg by mouth daily.         Marland Kitchen  docusate sodium (COLACE) 100 MG capsule   Oral   Take 100 mg by mouth 2 (two) times daily as needed. For constipation          . furosemide (LASIX) 40 MG tablet   Oral   Take 1 tablet (40 mg total) by mouth daily.   90 tablet   3   . hydrALAZINE (APRESOLINE) 50 MG tablet   Oral   Take 50 mg by mouth 3 (three) times daily.         Marland Kitchen HYDROcodone-acetaminophen (NORCO) 7.5-325 MG per tablet   Oral   Take 1 tablet by mouth every 6 (six) hours as needed for pain.         Marland Kitchen labetalol (NORMODYNE) 300 MG tablet   Oral   Take 300 mg by mouth 2 (two) times daily.         Marland Kitchen levofloxacin (LEVAQUIN) 500 MG tablet   Oral   Take 500 mg by mouth daily.         Marland Kitchen levothyroxine (SYNTHROID, LEVOTHROID) 25 MCG tablet   Oral   Take 25 mcg by mouth daily before breakfast.         . lovastatin (MEVACOR) 40 MG tablet   Oral   Take 40 mg by mouth 2 (two) times daily.         . metFORMIN (GLUCOPHAGE) 500 MG tablet   Oral   Take 500 mg by  mouth daily.          . ondansetron (ZOFRAN) 4 MG tablet   Oral   Take 1 tablet (4 mg total) by mouth every 8 (eight) hours as needed for nausea.   30 tablet   1   . warfarin (COUMADIN) 5 MG tablet   Oral   Take 5 mg by mouth daily. Take 7.5mg  on Tuesday, Thursday, Saturday and Sunday. Take 5mg  the rest of the week.          BP 132/75  Pulse 86  Temp(Src) 99 F (37.2 C) (Oral)  Resp 20  Ht 5\' 5"  (1.651 m)  Wt 178 lb (80.74 kg)  BMI 29.62 kg/m2  SpO2 100% Physical Exam  Nursing note and vitals reviewed. Constitutional: She is oriented to person, place, and time.  Chronically ill, NAD   HENT:  Head: Normocephalic and atraumatic.  Mouth/Throat: Oropharynx is clear and moist.  Eyes: Conjunctivae are normal. Pupils are equal, round, and reactive to light.  Neck: Normal range of motion. Neck supple.  Cardiovascular: Normal rate, regular rhythm and normal heart sounds.   Pulmonary/Chest: Effort normal and breath sounds normal. No respiratory distress. She has no wheezes. She has no rales.  Abdominal: Soft. Bowel sounds are normal. She exhibits no distension. There is no tenderness. There is no rebound and no guarding.  Musculoskeletal: Normal range of motion.  Neurological: She is alert and oriented to person, place, and time.  CN 2-12 intact. R arm contracted. Some cog wheeling on R arm. Nl strength and sensation throughout.   Skin: Skin is warm and dry.  Bilateral leg ulcers with legs wrapped  Psychiatric: She has a normal mood and affect. Her behavior is normal. Judgment and thought content normal.    ED Course  Procedures (including critical care time) Labs Review Labs Reviewed  PROTIME-INR - Abnormal; Notable for the following:    Prothrombin Time 35.2 (*)    INR 3.69 (*)    All other components within normal limits  APTT - Abnormal; Notable for the  following:    aPTT 43 (*)    All other components within normal limits  CBC - Abnormal; Notable for the following:     RBC 2.94 (*)    Hemoglobin 9.0 (*)    HCT 27.5 (*)    RDW 16.0 (*)    All other components within normal limits  DIFFERENTIAL - Abnormal; Notable for the following:    Neutrophils Relative % 81 (*)    Lymphocytes Relative 10 (*)    Lymphs Abs 0.5 (*)    All other components within normal limits  COMPREHENSIVE METABOLIC PANEL - Abnormal; Notable for the following:    Potassium 5.3 (*)    Glucose, Bld 168 (*)    BUN 35 (*)    Creatinine, Ser 1.88 (*)    Albumin 3.0 (*)    GFR calc non Af Amer 24 (*)    GFR calc Af Amer 28 (*)    All other components within normal limits  GLUCOSE, CAPILLARY - Abnormal; Notable for the following:    Glucose-Capillary 190 (*)    All other components within normal limits  POCT I-STAT, CHEM 8 - Abnormal; Notable for the following:    Potassium 5.3 (*)    BUN 36 (*)    Creatinine, Ser 2.20 (*)    Glucose, Bld 168 (*)    Calcium, Ion 1.39 (*)    Hemoglobin 9.9 (*)    HCT 29.0 (*)    All other components within normal limits  TROPONIN I   Imaging Review Ct Head Wo Contrast  04/05/2013   CLINICAL DATA:  Not using right arm, left hand and the closed position  EXAM: CT HEAD WITHOUT CONTRAST  TECHNIQUE: Contiguous axial images were obtained from the base of the skull through the vertex without intravenous contrast.  COMPARISON:  04/02/2013  FINDINGS: Stable diffuse atrophy. Stable low attenuation in the deep white matter. Small chronic lacunar infarct left basal ganglia, stable. No acute vascular territory infarct. No hemorrhage or extra-axial fluid. No skull fracture.  IMPRESSION: No acute abnormalities. Chronic involutional change again identified.   Electronically Signed   By: Esperanza Heir M.D.   On: 04/05/2013 17:12      Date: 04/05/2013  Rate: 82  Rhythm: atrial fibrillation  QRS Axis: normal  Intervals: normal  ST/T Wave abnormalities: nonspecific ST changes  Conduction Disutrbances:right bundle branch block  Narrative Interpretation:    Old EKG Reviewed: unchanged    EKG Interpretation   None       MDM  No diagnosis found. [name] is a 79 y.o. female here with persistent weakness on R arm. She has some cog wheeling as well. I think she may have some underlying parkinsons. Will repeat labs and CT head to r/o progression of stroke but I think the weakness seemed chronic. She has help at home but family wants more help. Will consult case management.    6:58 PM CT head showed no acute stroke. Since her symptoms are persistent for several days, if she had a stroke, CT would be positive. I think she likely has early Parkinsons. Labs showed slightly elevated Cr, likely from dehydration. Patient given IVF and felt better. INR elevated, will ask her to hold coumadin for several days. Case manager was able to get her more services. I will have her f/u with neuro for further evaluation of parkinsons.     Richardean Canal, MD 04/05/13 1900

## 2013-04-05 NOTE — ED Notes (Signed)
Called CT, [name] reports pt is the next one on their list.

## 2013-04-05 NOTE — Telephone Encounter (Signed)
Faxed referral to [facility] 

## 2013-04-05 NOTE — ED Notes (Signed)
Patient transported to CT 

## 2013-04-05 NOTE — ED Notes (Signed)
Per ems, pt came from home, pts right hand and arm are retracted since Saturday. Pt unable to completely extend right arm. Able to grip with both hands, right hand and arm weakness. Pt states she is unable to hold anything in her right hand. Pt able to lift left leg higher than right leg. Pt states she fell Monday and was seen by Dr. Freida Busman, CT scan was done, and was clear. Bilateral weakness. Hx of diabetes, weeping wounds and dressings on both legs. Denies pain, HA, Dizziness, vision changes. Pt states massaging sometimes helps to straighten it out.

## 2013-04-05 NOTE — ED Notes (Signed)
[facility] paged to take pt home.

## 2013-04-05 NOTE — Telephone Encounter (Signed)
Called [name] [phone]) the patients niece left message to call back.

## 2013-04-05 NOTE — Telephone Encounter (Signed)
Called [name] informed of MD instructions.  She agreed to do so asap.

## 2013-04-05 NOTE — Progress Notes (Signed)
   CARE MANAGEMENT ED NOTE 04/05/2013  Patient:  [name], [name]   Account Number:  [id number]  Date Initiated:  04/05/2013  Documentation initiated by:  [facility]  Subjective/Objective Assessment:   presented to ED stroke lie symptons, weakness,     Subjective/Objective Assessment Detail:     Action/Plan:   Action/Plan Detail:   Anticipated DC Date:  04/05/2013     Status Recommendation to Physician:   Result of Recommendation:  Agreed  Other ED Services  Consult Working Plan    DC Planning Services  CM consult   [facility] Choice  HOME HEALTH   Choice offered to / List presented to:  C-1 Patient     HH arranged  HH-2 PT  HH-3 OT  HH-6 SOCIAL WORKER  HH-4 NURSE'S AIDE      HH agency  [facility]    Status of service:  Completed, signed off  ED Comments:   ED Comments Detail:  04/05/13 630 p Beecher Mcardle RN BSN [phone] ED CM consulted by Dr. Silverio Lay regarding [facility] services. Pt presented to [facility]  ED with complaints of weakness.  Also presented on 04/02/13 s/p fall. Patient lives at home with Son and Daughter in-law that are involved in her care. Pt is active iwith [facility] as per patient and family. Patient reports that a Nurse comes out 3 x weekly for dressing changes. Daughter-in-law reports that patient needs more care than she can give. She was unable to ambulate even short distances in the home.   She is requiring higher level of care. Discussed with patient and family the option of increaseing services, patient in agreement.  Referral have been faxed in to add services PT, OT, HH, and SW to [facility] [phone]. Pt has rolling walker, 3:1 chair, w/c and hospital bed  at home. Pt states, she thinks  she may benefit from a  Skilled Nursing Facility. Explained that a SW referral was placed, and an assessment will be made to determine if there are  placement needs. Pt. verbalized understanding. [facility] next visit as per patient is 04/06/13. No  further ED CM needs identified.

## 2013-04-05 NOTE — ED Notes (Signed)
Pt cleaned up, placed in a new brief, and linen change.

## 2013-04-05 NOTE — Telephone Encounter (Signed)
Pt has no hx of prior CVA, please call 911 for pt to go to ER to r/o stroke as reason for not moving the right arm

## 2013-04-05 NOTE — ED Notes (Signed)
Attempted to gain IV access, unable to thread needle through vein. Able to get 2 ml of bld, put in CBC tube and sent to the lab. phlebotomy at bedside to collect the rest of the bld.

## 2013-04-05 NOTE — ED Notes (Addendum)
Pt transported home via [facility]. Pt's sister is at home to receive patient back. Pt comfortable with d/c and f/u instructions. Instructions given to [facility] to take home with pt.

## 2013-04-05 NOTE — ED Notes (Signed)
Discharge instructions reviewed with patient and daughter. Daughter verbalized she understood the discharge instructions and that she did not have any other questions at this time. Daughter is leaving ED to take a cab home to meet her mother at home. Daughter informed pt will be transported via EMS to their home.

## 2013-04-05 NOTE — Telephone Encounter (Signed)
[name] called states pt is not using right arm and reports pts left hand is in a closed position.  Please advise

## 2013-04-05 NOTE — ED Notes (Signed)
Received telephone call from pt's Son at approx 2130 stating pt did not arrive home with paperwork. Pt's declined further  information, stated he wanted his wife to call us back for further information. Gave pt's son this RN's direct telephone number.  This RN spoke to [place], from [place] who transported the pt. Duwayne Heck states she handed d/c paperwork to pt's daughter upon pt's arrival home.  Lurena Joiner RN and Jae Dire, Consulting civil engineer made aware of situation and verbalized agreement to take phone call if pt's family calls back after this RN leaves.

## 2013-04-11 ENCOUNTER — Telehealth: Payer: Self-pay | Admitting: *Deleted

## 2013-04-11 DIAGNOSIS — G20 Parkinson's disease: Secondary | ICD-10-CM

## 2013-04-11 NOTE — Telephone Encounter (Signed)
Very sorry, but i would not increase the pain medication further due to risk of side effect at her age; to let us know if she needs refill

## 2013-04-11 NOTE — Telephone Encounter (Signed)
[name] called states once [facility] leaves from completing dressing changes pt is in severe pain.  Requesting stronger pain medications.  Also states pt was just diagnosed with Parkinson's requesting a referral to Neurology.  Please advise

## 2013-04-12 NOTE — Telephone Encounter (Signed)
Referral done

## 2013-04-12 NOTE — Telephone Encounter (Signed)
[name] informed of MD instructions on pain medication.  They would like a referral to a neurologist.

## 2013-04-13 DIAGNOSIS — L97909 Non-pressure chronic ulcer of unspecified part of unspecified lower leg with unspecified severity: Secondary | ICD-10-CM | POA: Diagnosis not present

## 2013-04-13 DIAGNOSIS — I87319 Chronic venous hypertension (idiopathic) with ulcer of unspecified lower extremity: Secondary | ICD-10-CM | POA: Diagnosis not present

## 2013-04-18 ENCOUNTER — Telehealth: Payer: Self-pay | Admitting: Internal Medicine

## 2013-04-18 NOTE — Telephone Encounter (Signed)
There has been no request to my knowledge for referral to "rehab place" through me, and I dont seen any other similar by another MD since oct 6.  The only referral I have placed is to Neurology  Not sure what kind of rehab daughter is referring to, or who suggested it

## 2013-04-18 NOTE — Telephone Encounter (Signed)
Called [facility] to followup with referral.  They did call back to inform the patient will be contacted tomorrow 04/19/13 by Jodean Lima with [facility]. Called the Niece informed of information.  Also did give [facility] cell number [phone] for [facility]

## 2013-04-18 NOTE — Telephone Encounter (Signed)
pts daughter is calling checking on status of referral to rehab place. Legs are having very bad drainage. She states that this was suppose to be done on 10/6. Very upset that they do not know whats going on and request call back today.

## 2013-04-19 ENCOUNTER — Encounter: Payer: Self-pay | Admitting: Neurology

## 2013-04-19 ENCOUNTER — Ambulatory Visit (INDEPENDENT_AMBULATORY_CARE_PROVIDER_SITE_OTHER): Payer: Medicare Other | Admitting: Neurology

## 2013-04-19 DIAGNOSIS — I639 Cerebral infarction, unspecified: Secondary | ICD-10-CM | POA: Insufficient documentation

## 2013-04-19 DIAGNOSIS — I6350 Cerebral infarction due to unspecified occlusion or stenosis of unspecified cerebral artery: Secondary | ICD-10-CM

## 2013-04-19 NOTE — Progress Notes (Signed)
[facility]  [name] DOB: 05-Oct-1931  HISTORICAL  [name] is a 79 years old right-handed Caucasian female, accompanied by her daughter-in-law, [name], and niece [name] at today's clinical visit, she is referred by her primary care physician Dr. Oliver Barre for evaluation of right side weakness , possible Parkinson's disease  She had a past medical history of aging fibrillation, taking Coumadin, also with past medical history of hypertension, hyperlipidemia, diabetes, she has chronic bilateral lower extremity wound, has been bed ridden  She developed  sudden onset right arm weakness in September 2014, this involves her right arm, which has been persistent, she fell in October 6th, while getting up from commode, she was taken to the emergency room, CAT scan of the brain showed periventricular small vessel disease, but no acute lesions, CAT scan of the cervical spine show degenerative disc disease. She was able to take a few steps but was noted to have difficulty even bear weight, was taken back to hospital again October ninth, had a repeat CAT scan, there was no acute lesions, but there was stable diffuse atrophy, stable low attenuation in the deep white matter. Small chronic lacunar infarct left basal ganglia, stable. No acute vascular territory infarct  She has ulcers at her legs, bed ridden now, mild memory loss, her son and daughter have moved in with her recently.  She was independent, cooking for her self, cleaning, walking with a walker,  But she fell in Feb 2014, she developed left hip hematoma,was sent to [place], rehab, followed by home PT/OT. Because of her worsening gait difficulty, mild right hand tremor, she is referred for possible Parkinson's disease,     REVIEW OF SYSTEMS: Full 14 system review of systems performed and notable only for memory loss  ALLERGIES: Allergies  Allergen Reactions  . Clonidine Derivatives Itching and Rash    Skin  breaks out    HOME MEDICATIONS: Outpatient Prescriptions Prior to Visit  Medication Sig Dispense Refill  . ALPRAZolam (XANAX) 0.25 MG tablet Take 0.25 mg by mouth 2 (two) times daily as needed. For anxiety      . Cholecalciferol (VITAMIN D) 1000 UNITS capsule Take 1,000 Units by mouth daily.       . citalopram (CELEXA) 20 MG tablet Take 20 mg by mouth daily.      . collagenase (SANTYL) ointment Apply 1 application topically daily.      Marland Kitchen diltiazem (CARDIZEM CD) 120 MG 24 hr capsule Take 120 mg by mouth daily.      Marland Kitchen docusate sodium (COLACE) 100 MG capsule Take 100 mg by mouth 2 (two) times daily as needed. For constipation       . furosemide (LASIX) 40 MG tablet Take 1 tablet (40 mg total) by mouth daily.  90 tablet  3  . hydrALAZINE (APRESOLINE) 50 MG tablet Take 50 mg by mouth 3 (three) times daily.      Marland Kitchen HYDROcodone-acetaminophen (NORCO) 7.5-325 MG per tablet Take 1 tablet by mouth every 6 (six) hours as needed for pain.      Marland Kitchen labetalol (NORMODYNE) 300 MG tablet Take 300 mg by mouth 2 (two) times daily.      Marland Kitchen levofloxacin (LEVAQUIN) 500 MG tablet Take 500 mg by mouth daily.      Marland Kitchen levothyroxine (SYNTHROID, LEVOTHROID) 25 MCG tablet Take 25 mcg by mouth daily before breakfast.      . lovastatin (MEVACOR) 40 MG tablet Take 40 mg by mouth 2 (two) times daily.      Marland Kitchen  metFORMIN (GLUCOPHAGE) 500 MG tablet Take 500 mg by mouth daily.       . ondansetron (ZOFRAN) 4 MG tablet Take 1 tablet (4 mg total) by mouth every 8 (eight) hours as needed for nausea.  30 tablet  1  . warfarin (COUMADIN) 5 MG tablet Take 5 mg by mouth daily. Take 7.5mg  on Tuesday, Thursday, Saturday and Sunday. Take 5mg  the rest of the week.       No facility-administered medications prior to visit.    PAST MEDICAL HISTORY: Past Medical History  Diagnosis Date  . Obesity   . Anemia, iron deficiency   . Depression   . Diabetes mellitus type II   . Hypertension   . Peptic ulcer   . History of uterine fibroid   . Hx  of colonic polyps   . Diverticula, colon   . Osteoarthritis   . Low back pain   . Atrial fibrillation     permanent, on coumadin (followed by [facility] coumadin clinic)  . Pulmonary hypertension     echo 4/12:  EF 55-60%, mod LVH, mild BAE, RVE and PASP 51  . Anxiety   . DIABETES MELLITUS, TYPE II 03/05/2007  . HYPERLIPIDEMIA 03/05/2007  . ANEMIA-IRON DEFICIENCY 03/05/2007  . HYPERTENSION 03/05/2007  . DEPRESSION 03/05/2007  . LOW BACK PAIN 03/05/2007  . PERIPHERAL EDEMA 04/29/2008  . Chronic pain syndrome 06/23/2010  . FIBROIDS, UTERUS 03/05/2007  . PEPTIC ULCER DISEASE 03/05/2007  . DIVERTICULOSIS, COLON 03/05/2007  . OSTEOARTHRITIS 03/05/2007  . COLONIC POLYPS, HX OF 03/05/2007  . Hypothyroidism 10/15/2010  . Renal insufficiency 10/15/2010  . Atrial fibrillation     PAST SURGICAL HISTORY: Past Surgical History  Procedure Laterality Date  . Laparoscopic hysterectomy      FAMILY HISTORY: Family History  Problem Relation Age of Onset  . Hypertension Sister     SOCIAL HISTORY:  History   Social History  . Marital Status: Widowed    Spouse Name: N/A    Number of Children: 1  . Years of Education: 12th   Occupational History  . retail Retired  .     Social History Main Topics  . Smoking status: Former Smoker    Start date: 03/23/1972  . Smokeless tobacco: Never Used  . Alcohol Use: No  . Drug Use: No  . Sexual Activity: Not on file   Other Topics Concern  . Not on file   Social History Narrative   Patient lives at home with her son and daughter-in-law.   Patient is retired   Patient has 1 son.   Patient has an high school education.     PHYSICAL EXAM    Generalized: In no acute distress  Neck: Supple, no carotid bruits   Cardiac: Regular rate rhythm  Pulmonary: Clear to auscultation bilaterally  Musculoskeletal: No deformity  Neurological examination  Mentation: Alert oriented to time, place, history taking, and causual conversation  Cranial nerve II-XII: Pupils  were equal round reactive to light extraocular movements were full, visual field were full on confrontational test. facial sensation and strength were normal. hearing was intact to finger rubbing bilaterally. Uvula tongue midline.  head turning and shoulder shrug and were normal and symmetric.Tongue protrusion into cheek strength was normal.  Motor: she has mild right proximal arm weakness 4/5, right wrist flexion 3, wrist extension 3. Left arm strength is normal. She also has mild bilateral leg weakness 2/5, both legs were in wraps, trace ankle dorsiflexion weakness.  Coordination: Normal finger to  nose, right arm was proportional to her right arm weakness.  Gait: deferred  Deep tendon reflexes: hypoactive and symmetric, plantar responses were flexor bilaterally.   DIAGNOSTIC DATA (LABS, IMAGING, TESTING) - I reviewed patient records, labs, notes, testing and imaging myself where available.  Lab Results  Component Value Date   WBC 5.3 04/05/2013   HGB 9.9* 04/05/2013   HCT 29.0* 04/05/2013   MCV 93.5 04/05/2013   PLT 249 04/05/2013      Component Value Date/Time   NA 141 04/05/2013 1636   K 5.3* 04/05/2013 1636   CL 111 04/05/2013 1636   CO2 21 04/05/2013 1619   GLUCOSE 168* 04/05/2013 1636   GLUCOSE 117* 05/16/2006 0957   BUN 36* 04/05/2013 1636   CREATININE 2.20* 04/05/2013 1636   CALCIUM 9.3 04/05/2013 1619   PROT 6.1 04/05/2013 1619   ALBUMIN 3.0* 04/05/2013 1619   AST 18 04/05/2013 1619   ALT 21 04/05/2013 1619   ALKPHOS 94 04/05/2013 1619   BILITOT 0.3 04/05/2013 1619   GFRNONAA 24* 04/05/2013 1619   GFRAA 28* 04/05/2013 1619   Lab Results  Component Value Date   CHOL 131 07/18/2012   HDL 61.80 07/18/2012   LDLCALC 59 07/18/2012   LDLDIRECT 136.2 06/24/2009   TRIG 52.0 07/18/2012   CHOLHDL 2 07/18/2012   Lab Results  Component Value Date   HGBA1C 6.8* 01/26/2013   Lab Results  Component Value Date   VITAMINB12 308 06/06/2008   Lab Results  Component Value Date   TSH 1.10  01/11/2012     ASSESSMENT AND PLAN  79 years old [place] American female, with complicated past medical history including atrial fibrillation, on Coumadin treatment, hypertension, diabetes, presenting with acute onset of right arm weakness in September 15th 2014, CAT scan of the brain showed extensive periventricular white matter disease, lacunar infarction at the left basal ganglion.  1. Proceed with MRI of the brain to evaluate her stroke 2. Ultrasound of carotid artery, 3.  echocardiogram,  4. return to clinic  in 3 months with Gerlene Fee, M.D. Ph.D.  [facility] [address], [address] [place], [place] [address] [phone]

## 2013-04-20 ENCOUNTER — Telehealth: Payer: Self-pay | Admitting: Internal Medicine

## 2013-04-20 NOTE — Telephone Encounter (Signed)
Called HHRN left message to calll back

## 2013-04-20 NOTE — Telephone Encounter (Signed)
HHRN informed of MD instructions. 

## 2013-04-20 NOTE — Telephone Encounter (Signed)
Pt's daughter was visiting yesterday.  She is on the way back from [facility].  [name] has had a stroke.  She had an appointment at neurology yesterday.  The daughter is very upset.  [name] has a high temp today.  The home health nurse was there this morning and is very concerned.  The daughter wants her sent to a rehab.

## 2013-04-20 NOTE — Telephone Encounter (Signed)
Pt should go to ER for fever and pain now please, ok to call 911 if pt unable to be transported otherwise  Perhaps arrangements can be made at time of hospn for NH if pt is not able to be sent home from ER

## 2013-04-20 NOTE — Telephone Encounter (Signed)
[facility] called to inform she went out to do wound care this am on the patient. The patient was in the bed,  very lathargic and in a lot of pain.  The family is giving her hydrocodone but does not seem to help as the family was up all night with her.  Her pulse was 103 to 111 after doing the wound care,   temp of 100.2, lungs clear.  Please advise [phone] Clayborne Dana with [facility]).

## 2013-04-21 ENCOUNTER — Encounter (HOSPITAL_COMMUNITY): Payer: Self-pay | Admitting: Emergency Medicine

## 2013-04-21 ENCOUNTER — Emergency Department (HOSPITAL_COMMUNITY): Payer: Medicare Other

## 2013-04-21 ENCOUNTER — Inpatient Hospital Stay (HOSPITAL_COMMUNITY)
Admission: EM | Admit: 2013-04-21 | Discharge: 2013-04-25 | DRG: 603 | Disposition: A | Discharge status: Skilled Nursing Facility | Payer: Medicare Other | Attending: Internal Medicine | Admitting: Internal Medicine

## 2013-04-21 ENCOUNTER — Inpatient Hospital Stay (HOSPITAL_COMMUNITY): Payer: Medicare Other

## 2013-04-21 DIAGNOSIS — R5381 Other malaise: Secondary | ICD-10-CM

## 2013-04-21 DIAGNOSIS — IMO0002 Reserved for concepts with insufficient information to code with codable children: Secondary | ICD-10-CM

## 2013-04-21 DIAGNOSIS — I5032 Chronic diastolic (congestive) heart failure: Secondary | ICD-10-CM | POA: Diagnosis present

## 2013-04-21 DIAGNOSIS — I4891 Unspecified atrial fibrillation: Secondary | ICD-10-CM

## 2013-04-21 DIAGNOSIS — I739 Peripheral vascular disease, unspecified: Secondary | ICD-10-CM | POA: Diagnosis present

## 2013-04-21 DIAGNOSIS — Z860100 Personal history of colon polyps, unspecified: Secondary | ICD-10-CM

## 2013-04-21 DIAGNOSIS — F3289 Other specified depressive episodes: Secondary | ICD-10-CM

## 2013-04-21 DIAGNOSIS — Z7901 Long term (current) use of anticoagulants: Secondary | ICD-10-CM

## 2013-04-21 DIAGNOSIS — M25561 Pain in right knee: Secondary | ICD-10-CM

## 2013-04-21 DIAGNOSIS — M545 Low back pain, unspecified: Secondary | ICD-10-CM

## 2013-04-21 DIAGNOSIS — F329 Major depressive disorder, single episode, unspecified: Secondary | ICD-10-CM | POA: Diagnosis present

## 2013-04-21 DIAGNOSIS — I639 Cerebral infarction, unspecified: Secondary | ICD-10-CM

## 2013-04-21 DIAGNOSIS — E119 Type 2 diabetes mellitus without complications: Secondary | ICD-10-CM

## 2013-04-21 DIAGNOSIS — R509 Fever, unspecified: Secondary | ICD-10-CM

## 2013-04-21 DIAGNOSIS — Z79899 Other long term (current) drug therapy: Secondary | ICD-10-CM

## 2013-04-21 DIAGNOSIS — I10 Essential (primary) hypertension: Secondary | ICD-10-CM

## 2013-04-21 DIAGNOSIS — M1990 Unspecified osteoarthritis, unspecified site: Secondary | ICD-10-CM

## 2013-04-21 DIAGNOSIS — L97809 Non-pressure chronic ulcer of other part of unspecified lower leg with unspecified severity: Secondary | ICD-10-CM | POA: Diagnosis present

## 2013-04-21 DIAGNOSIS — I509 Heart failure, unspecified: Secondary | ICD-10-CM | POA: Diagnosis present

## 2013-04-21 DIAGNOSIS — R791 Abnormal coagulation profile: Secondary | ICD-10-CM

## 2013-04-21 DIAGNOSIS — E860 Dehydration: Secondary | ICD-10-CM | POA: Diagnosis present

## 2013-04-21 DIAGNOSIS — I2789 Other specified pulmonary heart diseases: Secondary | ICD-10-CM

## 2013-04-21 DIAGNOSIS — Z8249 Family history of ischemic heart disease and other diseases of the circulatory system: Secondary | ICD-10-CM

## 2013-04-21 DIAGNOSIS — I482 Chronic atrial fibrillation, unspecified: Secondary | ICD-10-CM | POA: Diagnosis present

## 2013-04-21 DIAGNOSIS — Z0000 Encounter for general adult medical examination without abnormal findings: Secondary | ICD-10-CM

## 2013-04-21 DIAGNOSIS — R296 Repeated falls: Secondary | ICD-10-CM

## 2013-04-21 DIAGNOSIS — I872 Venous insufficiency (chronic) (peripheral): Secondary | ICD-10-CM | POA: Diagnosis present

## 2013-04-21 DIAGNOSIS — R609 Edema, unspecified: Secondary | ICD-10-CM

## 2013-04-21 DIAGNOSIS — N289 Disorder of kidney and ureter, unspecified: Secondary | ICD-10-CM

## 2013-04-21 DIAGNOSIS — Z23 Encounter for immunization: Secondary | ICD-10-CM

## 2013-04-21 DIAGNOSIS — K279 Peptic ulcer, site unspecified, unspecified as acute or chronic, without hemorrhage or perforation: Secondary | ICD-10-CM

## 2013-04-21 DIAGNOSIS — I83009 Varicose veins of unspecified lower extremity with ulcer of unspecified site: Secondary | ICD-10-CM

## 2013-04-21 DIAGNOSIS — L02419 Cutaneous abscess of limb, unspecified: Principal | ICD-10-CM | POA: Diagnosis present

## 2013-04-21 DIAGNOSIS — N390 Urinary tract infection, site not specified: Secondary | ICD-10-CM

## 2013-04-21 DIAGNOSIS — Z8711 Personal history of peptic ulcer disease: Secondary | ICD-10-CM

## 2013-04-21 DIAGNOSIS — I50810 Right heart failure, unspecified: Secondary | ICD-10-CM

## 2013-04-21 DIAGNOSIS — E669 Obesity, unspecified: Secondary | ICD-10-CM

## 2013-04-21 DIAGNOSIS — R627 Adult failure to thrive: Secondary | ICD-10-CM | POA: Diagnosis present

## 2013-04-21 DIAGNOSIS — E785 Hyperlipidemia, unspecified: Secondary | ICD-10-CM

## 2013-04-21 DIAGNOSIS — N182 Chronic kidney disease, stage 2 (mild): Secondary | ICD-10-CM

## 2013-04-21 DIAGNOSIS — Z87891 Personal history of nicotine dependence: Secondary | ICD-10-CM

## 2013-04-21 DIAGNOSIS — I5033 Acute on chronic diastolic (congestive) heart failure: Secondary | ICD-10-CM

## 2013-04-21 DIAGNOSIS — D259 Leiomyoma of uterus, unspecified: Secondary | ICD-10-CM

## 2013-04-21 DIAGNOSIS — L03115 Cellulitis of right lower limb: Secondary | ICD-10-CM

## 2013-04-21 DIAGNOSIS — I129 Hypertensive chronic kidney disease with stage 1 through stage 4 chronic kidney disease, or unspecified chronic kidney disease: Secondary | ICD-10-CM | POA: Diagnosis present

## 2013-04-21 DIAGNOSIS — G894 Chronic pain syndrome: Secondary | ICD-10-CM

## 2013-04-21 DIAGNOSIS — M25569 Pain in unspecified knee: Secondary | ICD-10-CM

## 2013-04-21 DIAGNOSIS — M1710 Unilateral primary osteoarthritis, unspecified knee: Secondary | ICD-10-CM

## 2013-04-21 DIAGNOSIS — E039 Hypothyroidism, unspecified: Secondary | ICD-10-CM

## 2013-04-21 DIAGNOSIS — K5730 Diverticulosis of large intestine without perforation or abscess without bleeding: Secondary | ICD-10-CM

## 2013-04-21 DIAGNOSIS — D509 Iron deficiency anemia, unspecified: Secondary | ICD-10-CM

## 2013-04-21 DIAGNOSIS — F411 Generalized anxiety disorder: Secondary | ICD-10-CM

## 2013-04-21 LAB — CBC WITH DIFFERENTIAL/PLATELET
Basophils Absolute: 0 10*3/uL (ref 0.0–0.1)
Basophils Relative: 0 % (ref 0–1)
Eosinophils Absolute: 0.1 10*3/uL (ref 0.0–0.7)
Lymphocytes Relative: 7 % — ABNORMAL LOW (ref 12–46)
MCHC: 32.5 g/dL (ref 30.0–36.0)
Monocytes Absolute: 0.5 10*3/uL (ref 0.1–1.0)
Monocytes Relative: 7 % (ref 3–12)
Neutro Abs: 7.2 10*3/uL (ref 1.7–7.7)
Platelets: 199 10*3/uL (ref 150–400)
RDW: 15.3 % (ref 11.5–15.5)
WBC: 8.3 10*3/uL (ref 4.0–10.5)

## 2013-04-21 LAB — URINALYSIS, ROUTINE W REFLEX MICROSCOPIC
Glucose, UA: NEGATIVE mg/dL
Hgb urine dipstick: NEGATIVE
Nitrite: NEGATIVE
Protein, ur: 30 mg/dL — AB
pH: 5 (ref 5.0–8.0)

## 2013-04-21 LAB — APTT: aPTT: 41 seconds — ABNORMAL HIGH (ref 24–37)

## 2013-04-21 LAB — GLUCOSE, CAPILLARY: Glucose-Capillary: 196 mg/dL — ABNORMAL HIGH (ref 70–99)

## 2013-04-21 LAB — BASIC METABOLIC PANEL
CO2: 20 mEq/L (ref 19–32)
Calcium: 9.3 mg/dL (ref 8.4–10.5)
Chloride: 105 mEq/L (ref 96–112)
Creatinine, Ser: 1.76 mg/dL — ABNORMAL HIGH (ref 0.50–1.10)
GFR calc Af Amer: 31 mL/min — ABNORMAL LOW (ref >90)
GFR calc non Af Amer: 26 mL/min — ABNORMAL LOW (ref >90)
Sodium: 137 mEq/L (ref 135–145)

## 2013-04-21 LAB — SEDIMENTATION RATE: Sed Rate: 40 mm/hr — ABNORMAL HIGH (ref 0–22)

## 2013-04-21 LAB — CG4 I-STAT (LACTIC ACID): Lactic Acid, Venous: 2.04 mmol/L (ref 0.5–2.2)

## 2013-04-21 LAB — POCT I-STAT TROPONIN I: Troponin i, poc: 0.03 ng/mL (ref 0.00–0.08)

## 2013-04-21 LAB — PROTIME-INR: Prothrombin Time: 19.7 seconds — ABNORMAL HIGH (ref 11.6–15.2)

## 2013-04-21 LAB — URINE MICROSCOPIC-ADD ON

## 2013-04-21 MED ORDER — PANTOPRAZOLE SODIUM 40 MG PO TBEC
40.0000 mg | DELAYED_RELEASE_TABLET | Freq: Every day | ORAL | Status: DC
  Administered 2013-04-21 – 2013-04-25 (×5): 40 mg via ORAL
  Filled 2013-04-21 (×5): qty 1

## 2013-04-21 MED ORDER — HYDROCODONE-ACETAMINOPHEN 7.5-325 MG PO TABS
1.0000 | ORAL_TABLET | Freq: Four times a day (QID) | ORAL | Status: DC | PRN
  Administered 2013-04-21 – 2013-04-23 (×5): 1 via ORAL
  Filled 2013-04-21 (×5): qty 1

## 2013-04-21 MED ORDER — WARFARIN SODIUM 5 MG PO TABS: 5.0000 mg | ORAL_TABLET | Freq: Every day | ORAL | Status: DC

## 2013-04-21 MED ORDER — LEVOTHYROXINE SODIUM 25 MCG PO TABS
25.0000 ug | ORAL_TABLET | Freq: Every day | ORAL | Status: DC
  Administered 2013-04-22 – 2013-04-25 (×4): 25 ug via ORAL
  Filled 2013-04-21 (×6): qty 1

## 2013-04-21 MED ORDER — SODIUM CHLORIDE 0.9 % IV SOLN
INTRAVENOUS | Status: DC
  Administered 2013-04-21 – 2013-04-22 (×2): via INTRAVENOUS
  Administered 2013-04-23 – 2013-04-24 (×2): 10 mL/h via INTRAVENOUS

## 2013-04-21 MED ORDER — FENTANYL CITRATE 0.05 MG/ML IJ SOLN
50.0000 ug | Freq: Once | INTRAMUSCULAR | Status: AC
  Administered 2013-04-21: 50 ug via INTRAVENOUS
  Filled 2013-04-21: qty 2

## 2013-04-21 MED ORDER — DEXTROSE 5 % IV SOLN
1.0000 g | Freq: Once | INTRAVENOUS | Status: AC
  Administered 2013-04-21: 1 g via INTRAVENOUS
  Filled 2013-04-21: qty 10

## 2013-04-21 MED ORDER — SODIUM CHLORIDE 0.9 % IV SOLN
INTRAVENOUS | Status: DC
  Administered 2013-04-21: 17:00:00 via INTRAVENOUS

## 2013-04-21 MED ORDER — COLLAGENASE 250 UNIT/GM EX OINT
TOPICAL_OINTMENT | Freq: Every day | CUTANEOUS | Status: DC
  Administered 2013-04-21 – 2013-04-24 (×3): via TOPICAL
  Filled 2013-04-21 (×2): qty 30

## 2013-04-21 MED ORDER — DILTIAZEM HCL ER COATED BEADS 120 MG PO CP24
120.0000 mg | ORAL_CAPSULE | Freq: Every day | ORAL | Status: DC
  Administered 2013-04-22 – 2013-04-25 (×4): 120 mg via ORAL
  Filled 2013-04-21 (×4): qty 1

## 2013-04-21 MED ORDER — CITALOPRAM HYDROBROMIDE 20 MG PO TABS
20.0000 mg | ORAL_TABLET | Freq: Every day | ORAL | Status: DC
  Administered 2013-04-22 – 2013-04-25 (×4): 20 mg via ORAL
  Filled 2013-04-21 (×4): qty 1

## 2013-04-21 MED ORDER — HYDRALAZINE HCL 50 MG PO TABS
50.0000 mg | ORAL_TABLET | Freq: Three times a day (TID) | ORAL | Status: DC
  Administered 2013-04-21 – 2013-04-25 (×11): 50 mg via ORAL
  Filled 2013-04-21 (×14): qty 1

## 2013-04-21 MED ORDER — WARFARIN SODIUM 10 MG PO TABS
10.0000 mg | ORAL_TABLET | Freq: Once | ORAL | Status: AC
  Administered 2013-04-21: 10 mg via ORAL
  Filled 2013-04-21: qty 1

## 2013-04-21 MED ORDER — WARFARIN - PHARMACIST DOSING INPATIENT: Freq: Every day | Status: DC

## 2013-04-21 MED ORDER — VANCOMYCIN HCL IN DEXTROSE 1-5 GM/200ML-% IV SOLN
1000.0000 mg | INTRAVENOUS | Status: DC
  Administered 2013-04-21 – 2013-04-23 (×3): 1000 mg via INTRAVENOUS
  Filled 2013-04-21 (×4): qty 200

## 2013-04-21 MED ORDER — ATORVASTATIN CALCIUM 20 MG PO TABS
20.0000 mg | ORAL_TABLET | Freq: Every day | ORAL | Status: DC
  Administered 2013-04-22 – 2013-04-24 (×3): 20 mg via ORAL
  Filled 2013-04-21 (×4): qty 1

## 2013-04-21 MED ORDER — SORBITOL 70 % SOLN
30.0000 mL | Freq: Every day | Status: DC | PRN
  Administered 2013-04-24: 21:00:00 30 mL via ORAL
  Filled 2013-04-21 (×2): qty 30

## 2013-04-21 MED ORDER — SODIUM CHLORIDE 0.9 % IV BOLUS (SEPSIS)
1000.0000 mL | Freq: Once | INTRAVENOUS | Status: AC
  Administered 2013-04-21: 1000 mL via INTRAVENOUS

## 2013-04-21 MED ORDER — INFLUENZA VAC SPLIT QUAD 0.5 ML IM SUSP
0.5000 mL | INTRAMUSCULAR | Status: AC
  Administered 2013-04-22: 0.5 mL via INTRAMUSCULAR
  Filled 2013-04-21: qty 0.5

## 2013-04-21 MED ORDER — ONDANSETRON HCL 4 MG/2ML IJ SOLN: 4.0000 mg | Freq: Four times a day (QID) | INTRAMUSCULAR | Status: DC | PRN

## 2013-04-21 MED ORDER — LABETALOL HCL 300 MG PO TABS
300.0000 mg | ORAL_TABLET | Freq: Two times a day (BID) | ORAL | Status: DC
  Administered 2013-04-21 – 2013-04-25 (×8): 300 mg via ORAL
  Filled 2013-04-21 (×10): qty 1

## 2013-04-21 MED ORDER — SODIUM CHLORIDE 0.9 % IV SOLN
3.0000 g | Freq: Three times a day (TID) | INTRAVENOUS | Status: DC
  Administered 2013-04-21 – 2013-04-24 (×8): 3 g via INTRAVENOUS
  Filled 2013-04-21 (×10): qty 3

## 2013-04-21 MED ORDER — INSULIN ASPART 100 UNIT/ML ~~LOC~~ SOLN: 0.0000 [IU] | Freq: Every day | SUBCUTANEOUS | Status: DC

## 2013-04-21 MED ORDER — SODIUM CHLORIDE 0.9 % IJ SOLN
3.0000 mL | Freq: Two times a day (BID) | INTRAMUSCULAR | Status: DC
  Administered 2013-04-23 – 2013-04-25 (×5): 3 mL via INTRAVENOUS

## 2013-04-21 MED ORDER — SODIUM CHLORIDE 0.9 % IV SOLN: 250.0000 mL | INTRAVENOUS | Status: DC | PRN

## 2013-04-21 MED ORDER — SIMVASTATIN 40 MG PO TABS: 40.0000 mg | ORAL_TABLET | Freq: Every day | ORAL | Status: DC

## 2013-04-21 MED ORDER — ACETAMINOPHEN 325 MG PO TABS
650.0000 mg | ORAL_TABLET | Freq: Four times a day (QID) | ORAL | Status: DC | PRN
  Administered 2013-04-23: 650 mg via ORAL

## 2013-04-21 MED ORDER — ONDANSETRON HCL 4 MG PO TABS: 4.0000 mg | ORAL_TABLET | Freq: Four times a day (QID) | ORAL | Status: DC | PRN

## 2013-04-21 MED ORDER — SODIUM CHLORIDE 0.9 % IJ SOLN: 3.0000 mL | INTRAMUSCULAR | Status: DC | PRN

## 2013-04-21 MED ORDER — INSULIN ASPART 100 UNIT/ML ~~LOC~~ SOLN
0.0000 [IU] | Freq: Three times a day (TID) | SUBCUTANEOUS | Status: DC
  Administered 2013-04-22 – 2013-04-23 (×4): 3 [IU] via SUBCUTANEOUS
  Administered 2013-04-23: 2 [IU] via SUBCUTANEOUS
  Administered 2013-04-24 (×2): 5 [IU] via SUBCUTANEOUS
  Administered 2013-04-24: 06:00:00 2 [IU] via SUBCUTANEOUS
  Administered 2013-04-25: 12:00:00 8 [IU] via SUBCUTANEOUS
  Administered 2013-04-25: 3 [IU] via SUBCUTANEOUS

## 2013-04-21 MED ORDER — ACETAMINOPHEN 650 MG RE SUPP: 650.0000 mg | Freq: Four times a day (QID) | RECTAL | Status: DC | PRN

## 2013-04-21 MED ORDER — DOCUSATE SODIUM 100 MG PO CAPS
100.0000 mg | ORAL_CAPSULE | Freq: Two times a day (BID) | ORAL | Status: DC | PRN
  Administered 2013-04-24: 21:00:00 100 mg via ORAL
  Filled 2013-04-21 (×2): qty 1

## 2013-04-21 MED ORDER — ALUM & MAG HYDROXIDE-SIMETH 200-200-20 MG/5ML PO SUSP: 30.0000 mL | Freq: Four times a day (QID) | ORAL | Status: DC | PRN

## 2013-04-21 NOTE — ED Provider Notes (Signed)
CSN: [id number]     Arrival date & time 04/21/13  1108 History   First MD Initiated Contact with Patient 04/21/13 1115     Chief Complaint  Patient presents with  . Fever  . Weakness   (Consider location/radiation/quality/duration/timing/severity/associated sxs/prior Treatment) HPI Comments: Patient is a 79 year old female medical history significant for A. Fib, DM, HTN, PUD, Diverticulosis, OA, Pulmonary hypertension, bilateral LE venous ulcers w/ una boots placed presenting to the ED for three days of generalized weakness, fatigue, and fever (TMAX 100.37F PO yesterday). No alleviating or aggravating factors. Patient has been more chair and bed bound since her previous ED visits and recent neurology follow up d/t weakness. Patient denies any emesis, diarrhea, CP, SOB, diarrhea, abdominal pain, syncope, falls.    The history is provided by the patient and a relative.    Past Medical History  Diagnosis Date  . Obesity   . Anemia, iron deficiency   . Depression   . Diabetes mellitus type II   . Hypertension   . Peptic ulcer   . History of uterine fibroid   . Hx of colonic polyps   . Diverticula, colon   . Osteoarthritis   . Low back pain   . Atrial fibrillation     permanent, on coumadin (followed by [facility] coumadin clinic)  . Pulmonary hypertension     echo 4/12:  EF 55-60%, mod LVH, mild BAE, RVE and PASP 51  . Anxiety   . DIABETES MELLITUS, TYPE II 03/05/2007  . HYPERLIPIDEMIA 03/05/2007  . ANEMIA-IRON DEFICIENCY 03/05/2007  . HYPERTENSION 03/05/2007  . DEPRESSION 03/05/2007  . LOW BACK PAIN 03/05/2007  . PERIPHERAL EDEMA 04/29/2008  . Chronic pain syndrome 06/23/2010  . FIBROIDS, UTERUS 03/05/2007  . PEPTIC ULCER DISEASE 03/05/2007  . DIVERTICULOSIS, COLON 03/05/2007  . OSTEOARTHRITIS 03/05/2007  . COLONIC POLYPS, HX OF 03/05/2007  . Hypothyroidism 10/15/2010  . Renal insufficiency 10/15/2010  . Atrial fibrillation    Past Surgical History  Procedure Laterality Date  . Laparoscopic  hysterectomy     Family History  Problem Relation Age of Onset  . Hypertension Sister    History  Substance Use Topics  . Smoking status: Former Smoker    Start date: 03/23/1972  . Smokeless tobacco: Never Used  . Alcohol Use: No   OB History   Grav Para Term Preterm Abortions TAB SAB Ect Mult Living                 Review of Systems  Constitutional: Positive for fever, appetite change and fatigue.  Respiratory: Negative for cough and shortness of breath.   Cardiovascular: Negative for chest pain.  Gastrointestinal: Negative for nausea, vomiting and abdominal pain.  Skin: Positive for wound (chronic stasis ulcers).  All other systems reviewed and are negative.    Allergies  Clonidine derivatives  Home Medications   Current Outpatient Rx  Name  Route  Sig  Dispense  Refill  . ALPRAZolam (XANAX) 0.25 MG tablet   Oral   Take 0.25 mg by mouth 2 (two) times daily as needed. For anxiety         . Cholecalciferol (VITAMIN D) 1000 UNITS capsule   Oral   Take 1,000 Units by mouth daily.          . citalopram (CELEXA) 20 MG tablet   Oral   Take 20 mg by mouth daily.         . collagenase (SANTYL) ointment   Topical   Apply 1 application  topically daily.         Marland Kitchen diltiazem (CARDIZEM CD) 120 MG 24 hr capsule   Oral   Take 120 mg by mouth daily.         Marland Kitchen docusate sodium (COLACE) 100 MG capsule   Oral   Take 100 mg by mouth 2 (two) times daily as needed. For constipation          . furosemide (LASIX) 40 MG tablet   Oral   Take 1 tablet (40 mg total) by mouth daily.   90 tablet   3   . hydrALAZINE (APRESOLINE) 50 MG tablet   Oral   Take 50 mg by mouth 3 (three) times daily.         Marland Kitchen HYDROcodone-acetaminophen (NORCO) 7.5-325 MG per tablet   Oral   Take 1 tablet by mouth every 6 (six) hours as needed for pain.         Marland Kitchen labetalol (NORMODYNE) 300 MG tablet   Oral   Take 300 mg by mouth 2 (two) times daily.         Marland Kitchen levofloxacin  (LEVAQUIN) 500 MG tablet   Oral   Take 500 mg by mouth daily.         Marland Kitchen levothyroxine (SYNTHROID, LEVOTHROID) 25 MCG tablet   Oral   Take 25 mcg by mouth daily before breakfast.         . lovastatin (MEVACOR) 40 MG tablet   Oral   Take 40 mg by mouth 2 (two) times daily.         . metFORMIN (GLUCOPHAGE) 500 MG tablet   Oral   Take 500 mg by mouth daily.          . ondansetron (ZOFRAN) 4 MG tablet   Oral   Take 1 tablet (4 mg total) by mouth every 8 (eight) hours as needed for nausea.   30 tablet   1   . warfarin (COUMADIN) 5 MG tablet   Oral   Take 5-7.5 mg by mouth daily. Take 7.5mg  on Monday, Wednesday, Friday. Take 5mg  the rest of the week.          BP 117/59  Pulse 94  Temp(Src) 98.6 F (37 C) (Oral)  Resp 12  SpO2 97% Physical Exam  Constitutional: She is oriented to person, place, and time. She appears well-developed and well-nourished. No distress.  HENT:  Head: Normocephalic and atraumatic.  Right Ear: External ear normal.  Left Ear: External ear normal.  Nose: Nose normal.  Mouth/Throat: Uvula is midline and oropharynx is clear and moist. Mucous membranes are dry.  Eyes: Conjunctivae and EOM are normal. Pupils are equal, round, and reactive to light.  Neck: Normal range of motion. Neck supple.  Cardiovascular: Normal heart sounds and normal pulses.  An irregularly irregular rhythm present. Tachycardia present.   Pulmonary/Chest: Effort normal and breath sounds normal. No respiratory distress.  Abdominal: Soft. Bowel sounds are normal. There is no tenderness. There is no rigidity, no rebound and no guarding.  Musculoskeletal: She exhibits no edema.  Neurological: She is alert and oriented to person, place, and time. No cranial nerve deficit.  Skin: Skin is warm and dry. She is not diaphoretic.  Large ulcers w/o serosanginous fluid draining on bilateral LE. No warmth, surrounding erythema, or purulent drainage noted.   Psychiatric: She has a normal  mood and affect.    ED Course  Procedures (including critical care time) Medications  collagenase (SANTYL) ointment (not administered)  cefTRIAXone (ROCEPHIN) 1 g in dextrose 5 % 50 mL IVPB (not administered)  0.9 %  sodium chloride infusion (not administered)  sodium chloride 0.9 % bolus 1,000 mL (1,000 mLs Intravenous New Bag/Given 04/21/13 1306)  fentaNYL (SUBLIMAZE) injection 50 mcg (50 mcg Intravenous Given 04/21/13 1433)    Labs Review Labs Reviewed  CBC WITH DIFFERENTIAL - Abnormal; Notable for the following:    RBC 2.94 (*)    Hemoglobin 8.8 (*)    HCT 27.1 (*)    Neutrophils Relative % 86 (*)    Lymphocytes Relative 7 (*)    Lymphs Abs 0.5 (*)    All other components within normal limits  BASIC METABOLIC PANEL - Abnormal; Notable for the following:    Glucose, Bld 154 (*)    BUN 38 (*)    Creatinine, Ser 1.76 (*)    GFR calc non Af Amer 26 (*)    GFR calc Af Amer 31 (*)    All other components within normal limits  URINALYSIS, ROUTINE W REFLEX MICROSCOPIC - Abnormal; Notable for the following:    APPearance CLOUDY (*)    Protein, ur 30 (*)    Leukocytes, UA MODERATE (*)    All other components within normal limits  APTT - Abnormal; Notable for the following:    aPTT 41 (*)    All other components within normal limits  PROTIME-INR - Abnormal; Notable for the following:    Prothrombin Time 19.7 (*)    INR 1.72 (*)    All other components within normal limits  URINE CULTURE  CULTURE, BLOOD (ROUTINE X 2)  CULTURE, BLOOD (ROUTINE X 2)  URINE MICROSCOPIC-ADD ON  CG4 I-STAT (LACTIC ACID)  POCT I-STAT TROPONIN I   Imaging Review Dg Chest Portable 1 View  04/21/2013   CLINICAL DATA:  Fever and weakness.  EXAM: PORTABLE CHEST - 1 VIEW  COMPARISON:  01/26/2013  FINDINGS: There is stable cardiomegaly. Mild atelectasis present at the right base. There is no evidence of pulmonary edema, consolidation, pneumothorax, nodule or pleural fluid. The visualized skeletal  structures are unremarkable.  IMPRESSION: No active disease.   Electronically Signed   By: Irish Lack M.D.   On: 04/21/2013 12:49    EKG Interpretation   None       MDM   1. Fever   2. Atrial fibrillation   3. CKD (chronic kidney disease) stage 2, GFR 60-89 ml/min   4. Failure to thrive   5. Fever and chills   6. UTI (lower urinary tract infection)    Patient febrile w/ source likely related to pyuria. I have reviewed nursing notes, vital signs, and all appropriate lab and imaging results for this patient. The patient appears reasonably stabilized for admission considering the current resources, flow, and capabilities available in the ED at this time, and I doubt any other [facility] requiring further screening and/or treatment in the ED prior to admission. Patient d/w with Dr. Fonnie Jarvis, agrees with plan.    Jeannetta Ellis, PA-C 04/21/13 1606

## 2013-04-21 NOTE — Progress Notes (Signed)
ANTIBIOTIC CONSULT NOTE - INITIAL  Pharmacy Consult for Vanco/Unasyn and Coumadin Indication: LE Cellulits and r/o UTI +Afib  Allergies  Allergen Reactions  . Clonidine Derivatives Itching and Rash    Skin breaks out    Patient Measurements:   Adjusted Body Weight:    Vital Signs: Temp: 98.6 F (37 C) (10/25 1517) Temp src: Oral (10/25 1517) BP: 129/55 mmHg (10/25 1615) Pulse Rate: 60 (10/25 1611) Intake/Output from previous day:   Intake/Output from this shift:    Labs:  Recent Labs  04/21/13 1240  WBC 8.3  HGB 8.8*  PLT 199  CREATININE 1.76*   The CrCl is unknown because both a height and weight (above a minimum accepted value) are required for this calculation. No results found for this basename: Rolm Gala, VANCORANDOM, GENTTROUGH, GENTPEAK, GENTRANDOM, TOBRATROUGH, TOBRAPEAK, TOBRARND, AMIKACINPEAK, AMIKACINTROU, AMIKACIN,  in the last 72 hours   Microbiology: Recent Results (from the past 720 hour(s))  URINE CULTURE     Status: None   Collection Time    04/02/13  6:01 PM      Result Value Range Status   Specimen Description URINE, RANDOM   Final   Special Requests NONE   Final   Culture  Setup Time     Final   Value: 04/03/2013 01:07     Performed at [facility] Count     Final   Value: NO GROWTH     Performed at [facility]   Culture     Final   Value: NO GROWTH     Performed at [facility]   Report Status 04/03/2013 FINAL   Final    Medical History: Past Medical History  Diagnosis Date  . Obesity   . Anemia, iron deficiency   . Depression   . Diabetes mellitus type II   . Hypertension   . Peptic ulcer   . History of uterine fibroid   . Hx of colonic polyps   . Diverticula, colon   . Osteoarthritis   . Low back pain   . Atrial fibrillation     permanent, on coumadin (followed by [facility] coumadin clinic)  . Pulmonary hypertension     echo 4/12:  EF 55-60%, mod LVH, mild BAE, RVE and PASP 51   . Anxiety   . DIABETES MELLITUS, TYPE II 03/05/2007  . HYPERLIPIDEMIA 03/05/2007  . ANEMIA-IRON DEFICIENCY 03/05/2007  . HYPERTENSION 03/05/2007  . DEPRESSION 03/05/2007  . LOW BACK PAIN 03/05/2007  . PERIPHERAL EDEMA 04/29/2008  . Chronic pain syndrome 06/23/2010  . FIBROIDS, UTERUS 03/05/2007  . PEPTIC ULCER DISEASE 03/05/2007  . DIVERTICULOSIS, COLON 03/05/2007  . OSTEOARTHRITIS 03/05/2007  . COLONIC POLYPS, HX OF 03/05/2007  . Hypothyroidism 10/15/2010  . Renal insufficiency 10/15/2010  . Atrial fibrillation     Medications:  Prescriptions prior to admission  Medication Sig Dispense Refill  . ALPRAZolam (XANAX) 0.25 MG tablet Take 0.25 mg by mouth 2 (two) times daily as needed. For anxiety      . Cholecalciferol (VITAMIN D) 1000 UNITS capsule Take 1,000 Units by mouth daily.       . citalopram (CELEXA) 20 MG tablet Take 20 mg by mouth daily.      . collagenase (SANTYL) ointment Apply 1 application topically daily.      Marland Kitchen diltiazem (CARDIZEM CD) 120 MG 24 hr capsule Take 120 mg by mouth daily.      Marland Kitchen docusate sodium (COLACE) 100 MG capsule Take 100 mg  by mouth 2 (two) times daily as needed. For constipation       . furosemide (LASIX) 40 MG tablet Take 1 tablet (40 mg total) by mouth daily.  90 tablet  3  . hydrALAZINE (APRESOLINE) 50 MG tablet Take 50 mg by mouth 3 (three) times daily.      Marland Kitchen HYDROcodone-acetaminophen (NORCO) 7.5-325 MG per tablet Take 1 tablet by mouth every 6 (six) hours as needed for pain.      Marland Kitchen labetalol (NORMODYNE) 300 MG tablet Take 300 mg by mouth 2 (two) times daily.      Marland Kitchen levofloxacin (LEVAQUIN) 500 MG tablet Take 500 mg by mouth daily.      Marland Kitchen levothyroxine (SYNTHROID, LEVOTHROID) 25 MCG tablet Take 25 mcg by mouth daily before breakfast.      . lovastatin (MEVACOR) 40 MG tablet Take 40 mg by mouth 2 (two) times daily.      . metFORMIN (GLUCOPHAGE) 500 MG tablet Take 500 mg by mouth daily.       . ondansetron (ZOFRAN) 4 MG tablet Take 1 tablet (4 mg total) by mouth every 8  (eight) hours as needed for nausea.  30 tablet  1  . warfarin (COUMADIN) 5 MG tablet Take 5-7.5 mg by mouth daily. Take 7.5mg  on Monday, Wednesday, Friday. Take 5mg  the rest of the week.       Assessment: Recurrent LE cellulits with functional decline, FTT, fever  79 y/o F with complex PMH was previously treated for LE cellulits 8/14. No evidence of osteo and pt was placed on doxycycline. Patient has chronic B LE ulcers (ischemic) now with erythema. Possible UTI. Patient is also on chronic Coumadin for afib with admit INR 1.72.  Labs: WBC 8.3, H/H: 8.8/27.1. Plts 199, %neutrophils high 86 Scr 1.76 with estimated CrCl 33  Goal of Therapy:  Vancomycin trough level 10-15 mcg/ml  Plan:  1. Vancomycin 1g IV q24h. Trough at steady state. 2. Unasyn 3g IV q8hr 3. Coumadin 10mg  po x 1 tonight. Daily INR   Kherington Meraz S. Merilynn Finland, PharmD, BCPS Clinical Staff Pharmacist Pager [phone]  Misty Stanley Stillinger 04/21/2013,6:07 PM

## 2013-04-21 NOTE — ED Provider Notes (Signed)
Medical screening examination/treatment/procedure(s) were conducted as a shared visit with non-physician practitioner(s) and myself.  I personally evaluated the patient during the encounter.  EKG Interpretation     Ventricular Rate:  107 PR Interval:    QRS Duration: 138 QT Interval:  342 QTC Calculation: 456 R Axis:   -4 Text Interpretation:  Atrial fibrillation Ventricular premature complex Right bundle branch block No significant change since last tracing             At baseline the patient has generalized weakness and is nonambulatory, she spends most of her time in a recliner, she lives at home with family, she has a history of worse than usual generalized weakness for 2 days with fever but no altered mental status no headache no cough no shortness of breath no abdominal pain no vomiting no diarrhea no rash.  Hurman Horn, MD 04/29/13 [phone]

## 2013-04-21 NOTE — Progress Notes (Signed)
Patient came to the floor from ED at 1730, alert, oriented, son at bed side, denies pain at the time, venous ulcer to both chin, R chin wound is all around the leg drain more than the L. leg. Patient bottom is dry, small  skin tear to bottom, both heel is boggy, no blister. Wound care consult. Will continue to monitor patient. Burr Medico RN

## 2013-04-21 NOTE — ED Notes (Signed)
Pt arrived from home with son by [facility] with c/o weakness and fever 100.6. Pt son stated that fever started yesterday. Pt was seen here earlier this month and was told pt had parkinson's and when she went to the neurologist pt was told that she does not have Parkinson's that it was a stroke. Denies any pain. Pt takes Norco for pain for bilateral leg wounds that has been over 4months and is being tx at wound center with home health nurse that comes 3x a week to help change out dressings.

## 2013-04-21 NOTE — H&P (Addendum)
[facility] History and Physical  [name] RUE:[id number] DOB: [birth year] DOA: 04/21/2013  Referring physician:  PCP: [name] Barre, MD  Specialists:   Chief Complaint: Functional decline/failure to thrive/fever  HPI: [name] is a 79 y.o. female with multiple comorbidities including chronic bilateral lower extremity ulcers, peripheral vascular disease, venous stasis insufficiency, type 2 diabetes, who was admitted to her service back on 01/26/2013 for her lower extremity cellulitis. Imaging studies at that time did not show evidence for osteomyelitis. She was discharged in stable condition on oral antibiotic therapy with doxycycline. Patient presented to the emergent apartment with complaints of having an overall functional decline, failure to thrive the last several weeks. Patient has had progressive generalized weakness, fatigue, minimal by mouth intake, becoming less active and interactive with family members. She had a temperature of 100.6 yesterday. In the emergency room she was found to have a temperature of 100.7 rectal. She denies cough, sputum production, nausea, vomiting, diarrhea, shortness of breath or chest pain. Lab work in the emergency department showed a white count of 8300, hemoglobin of 8.8 with a creatinine of 1.76. She was given a dose of Rocephin IV.                                            Review of Systems: The patient denies, vision loss, decreased hearing, hoarseness, chest pain, syncope, dyspnea on exertion, peripheral edema, balance deficits, hemoptysis, abdominal pain, melena, hematochezia, severe indigestion/heartburn, hematuria, incontinence, genital sores, transient blindness, difficulty walking, depression, unusual weight change, abnormal bleeding, enlarged lymph nodes, angioedema, and breast masses.    Past Medical History  Diagnosis Date  . Obesity   . Anemia, iron deficiency   . Depression   . Diabetes mellitus type II   . Hypertension   . Peptic  ulcer   . History of uterine fibroid   . Hx of colonic polyps   . Diverticula, colon   . Osteoarthritis   . Low back pain   . Atrial fibrillation     permanent, on coumadin (followed by [facility] coumadin clinic)  . Pulmonary hypertension     echo 4/12:  EF 55-60%, mod LVH, mild BAE, RVE and PASP 51  . Anxiety   . DIABETES MELLITUS, TYPE II 03/05/2007  . HYPERLIPIDEMIA 03/05/2007  . ANEMIA-IRON DEFICIENCY 03/05/2007  . HYPERTENSION 03/05/2007  . DEPRESSION 03/05/2007  . LOW BACK PAIN 03/05/2007  . PERIPHERAL EDEMA 04/29/2008  . Chronic pain syndrome 06/23/2010  . FIBROIDS, UTERUS 03/05/2007  . PEPTIC ULCER DISEASE 03/05/2007  . DIVERTICULOSIS, COLON 03/05/2007  . OSTEOARTHRITIS 03/05/2007  . COLONIC POLYPS, HX OF 03/05/2007  . Hypothyroidism 10/15/2010  . Renal insufficiency 10/15/2010  . Atrial fibrillation    Past Surgical History  Procedure Laterality Date  . Laparoscopic hysterectomy     Social History:  reports that she has quit smoking. She started smoking about 41 years ago. She has never used smokeless tobacco. She reports that she does not drink alcohol or use illicit drugs. Patient presently resides with her son and daughter-in-law. Has had a gradual functional decline over the last several months  Allergies  Allergen Reactions  . Clonidine Derivatives Itching and Rash    Skin breaks out    Family History  Problem Relation Age of Onset  . Hypertension Sister      Prior to Admission medications   Medication Sig  Start Date End Date Taking? Authorizing Provider  ALPRAZolam (XANAX) 0.25 MG tablet Take 0.25 mg by mouth 2 (two) times daily as needed. For anxiety   Yes Historical Provider, MD  Cholecalciferol (VITAMIN D) 1000 UNITS capsule Take 1,000 Units by mouth daily.    Yes Historical Provider, MD  citalopram (CELEXA) 20 MG tablet Take 20 mg by mouth daily.   Yes Historical Provider, MD  collagenase (SANTYL) ointment Apply 1 application topically daily.   Yes Historical Provider, MD   diltiazem (CARDIZEM CD) 120 MG 24 hr capsule Take 120 mg by mouth daily.   Yes Historical Provider, MD  docusate sodium (COLACE) 100 MG capsule Take 100 mg by mouth 2 (two) times daily as needed. For constipation    Yes Historical Provider, MD  furosemide (LASIX) 40 MG tablet Take 1 tablet (40 mg total) by mouth daily. 01/10/13  Yes Corwin Levins, MD  hydrALAZINE (APRESOLINE) 50 MG tablet Take 50 mg by mouth 3 (three) times daily.   Yes Historical Provider, MD  HYDROcodone-acetaminophen (NORCO) 7.5-325 MG per tablet Take 1 tablet by mouth every 6 (six) hours as needed for pain.   Yes Historical Provider, MD  labetalol (NORMODYNE) 300 MG tablet Take 300 mg by mouth 2 (two) times daily.   Yes Historical Provider, MD  levofloxacin (LEVAQUIN) 500 MG tablet Take 500 mg by mouth daily.   Yes Historical Provider, MD  levothyroxine (SYNTHROID, LEVOTHROID) 25 MCG tablet Take 25 mcg by mouth daily before breakfast.   Yes Historical Provider, MD  lovastatin (MEVACOR) 40 MG tablet Take 40 mg by mouth 2 (two) times daily.   Yes Historical Provider, MD  metFORMIN (GLUCOPHAGE) 500 MG tablet Take 500 mg by mouth daily.    Yes Historical Provider, MD  ondansetron (ZOFRAN) 4 MG tablet Take 1 tablet (4 mg total) by mouth every 8 (eight) hours as needed for nausea. 02/13/13  Yes Corwin Levins, MD  warfarin (COUMADIN) 5 MG tablet Take 5-7.5 mg by mouth daily. Take 7.5mg  on Monday, Wednesday, Friday. Take 5mg  the rest of the week.   Yes Historical Provider, MD   Physical Exam: Filed Vitals:   04/21/13 1611  BP: 130/61  Pulse: 60  Temp:   Resp: 20     General:  Ill-appearing, no acute distress, she is responding appropriately to my questions  Eyes: Pupils are equal round reactive to light, extraocular movement is intact no sclera icterus  Neck: Neck is supple symmetrical no jugular venous distention or carotid bruits  Cardiovascular: Irregular rate and rhythm tachycardic normal S1-S2  Respiratory: Patient  having normal respiratory effort, lungs are clear to auscultation  Abdomen: Overall soft nontender nondistended positive bowel sounds  Skin: Patient having multiple wounds/ulcerations covering bilateral lower extremities, right greater than left, with associated trace edema to lower extremities. Around areas of ulceration there is some erythema present, purulence was not noted  Musculoskeletal: Preserved range of motion to all extremities  Psychiatric: Patient is awake alert oriented  Neurologic: She appears to have generalized weakness, no facial droop or slurred speech. 5 of 5 muscle strength upper and lower extremities  Labs on Admission:  Basic Metabolic Panel:  Recent Labs Lab 04/21/13 1240  NA 137  K 4.1  CL 105  CO2 20  GLUCOSE 154*  BUN 38*  CREATININE 1.76*  CALCIUM 9.3   Liver Function Tests: No results found for this basename: AST, ALT, ALKPHOS, BILITOT, PROT, ALBUMIN,  in the last 168 hours No results found for  this basename: LIPASE, AMYLASE,  in the last 168 hours No results found for this basename: AMMONIA,  in the last 168 hours CBC:  Recent Labs Lab 04/21/13 1240  WBC 8.3  NEUTROABS 7.2  HGB 8.8*  HCT 27.1*  MCV 92.2  PLT 199   Cardiac Enzymes: No results found for this basename: CKTOTAL, CKMB, CKMBINDEX, TROPONINI,  in the last 168 hours  BNP (last 3 results)  Recent Labs  01/26/13 1659 01/29/13 0530  PROBNP 3453.0* 3513.0*   CBG: No results found for this basename: GLUCAP,  in the last 168 hours  Radiological Exams on Admission: Dg Chest Portable 1 View  04/21/2013   CLINICAL DATA:  Fever and weakness.  EXAM: PORTABLE CHEST - 1 VIEW  COMPARISON:  01/26/2013  FINDINGS: There is stable cardiomegaly. Mild atelectasis present at the right base. There is no evidence of pulmonary edema, consolidation, pneumothorax, nodule or pleural fluid. The visualized skeletal structures are unremarkable.  IMPRESSION: No active disease.   Electronically  Signed   By: Irish Lack M.D.   On: 04/21/2013 12:49      Assessment/Plan Active Problems:   DIABETES MELLITUS, TYPE II   HYPERLIPIDEMIA   FIBRILLATION, ATRIAL   Chronic diastolic heart failure   CKD (chronic kidney disease) stage 2, GFR 60-89 ml/min   UTI (lower urinary tract infection)   Fever and chills   Failure to thrive   Venous stasis ulcers   1. Functional decline/failure to thrive. Patient having progressive decline over the last several months, with a steep incline the last 3 days. She has multiple comorbidities. It's possible that underlying infectious process, dehydration, and progression of underlying disease are all contributors. Will admit patient, hold off on diuretic therapy, start empiric IV antibiotic therapy, provide supportive care.  2. Chronic bilateral lower extremity ulcers. Patient was seen by Dr. Lajoyce Corners back in August at which time he felt that routine compression for venous insufficiency component could worsen arterial insufficiency and make ischemic ulcers worse. He did not feel patient was candidate for revascularization, and recommended Santyl dressings daily. Will consult wound care. 3. Possible lower extremity cellulitis. On exam I noted erythema around ulceration. Will check x-ray to assess for possible underlying osteomyelitis. Will also check sedimentation rate and CRP, start patient on empiric IV antibiotic therapy with  Unasyn and Vancomycin. Followup on blood cultures. 4. Urinary tract infection. Is conceivable that underlying urinary tract infection may be the cause of her fever. She has been started on broad-spectrum empiric antibiotic therapy, followup on urine cultures. Will check a lactic acid. 5. Dehydration. Patient reporting minimal by mouth intake over the last 3 days. She takes Lasix 40 mg by mouth daily. Will hold diuretics this evening, reassess volume status tomorrow. Encourage by mouth intake. Run NS at 60 ml/hour over night, monitor closely  for fluid overload, reassess volume status in AM. 6. Chronic diastolic congestive heart failure. She does not appear to have clinical signs or symptoms suggest an acute CHF. I am concerned about dehydration as she has had minimal by mouth intake in the last several days. For tonight I will hold Lasix, however diuretic therapy should be readdressed tomorrow morning. 7. Atrial fibrillation. Will continue labetalol 300 mg by mouth twice a day and Cardizem 120 mg by mouth daily.  8. Anticoagulation. Patient on Coumadin therapy, presented with an INR 1.72. Pharmacy consultation for Coumadin management. 9. Type 2 diabetes mellitus. Will discontinue metformin, Lasix patient on sliding scale coverage and Accu-Cheks q. a.c. and  each bedtime.  10. Stage II chronic kidney disease. Patient having a baseline creatinine being 1.7 which is what she presented with today. Will monitor kidney function, renal adjustment of meds. 11. History of peptic ulcer disease. Place patient on proton pump inhibitor therapy      Code Status: Full Code  Disposition Plan: Patient will be admitted to telemetry, I anticipate she'll require greater than 2 night hospitalization, inpatient status  Time spent: 75 minutes  Jeralyn Bennett [facility] Pager [phone]  If 7PM-7AM, please contact night-coverage [email or web address] Password [facility] 04/21/2013, 4:13 PM

## 2013-04-22 DIAGNOSIS — I83009 Varicose veins of unspecified lower extremity with ulcer of unspecified site: Secondary | ICD-10-CM

## 2013-04-22 DIAGNOSIS — R627 Adult failure to thrive: Secondary | ICD-10-CM

## 2013-04-22 DIAGNOSIS — K279 Peptic ulcer, site unspecified, unspecified as acute or chronic, without hemorrhage or perforation: Secondary | ICD-10-CM

## 2013-04-22 LAB — CBC
HCT: 24.8 % — ABNORMAL LOW (ref 36.0–46.0)
Hemoglobin: 8.2 g/dL — ABNORMAL LOW (ref 12.0–15.0)
MCH: 30.1 pg (ref 26.0–34.0)
MCHC: 33.1 g/dL (ref 30.0–36.0)
MCV: 91.2 fL (ref 78.0–100.0)

## 2013-04-22 LAB — GLUCOSE, CAPILLARY
Glucose-Capillary: 156 mg/dL — ABNORMAL HIGH (ref 70–99)
Glucose-Capillary: 188 mg/dL — ABNORMAL HIGH (ref 70–99)
Glucose-Capillary: 144 mg/dL — ABNORMAL HIGH (ref 70–99)

## 2013-04-22 LAB — BASIC METABOLIC PANEL
BUN: 34 mg/dL — ABNORMAL HIGH (ref 6–23)
Chloride: 107 mEq/L (ref 96–112)
Glucose, Bld: 119 mg/dL — ABNORMAL HIGH (ref 70–99)
Potassium: 3.6 mEq/L (ref 3.5–5.1)

## 2013-04-22 LAB — C-REACTIVE PROTEIN: CRP: 11.6 mg/dL — ABNORMAL HIGH (ref <0.60)

## 2013-04-22 LAB — URINE CULTURE: Colony Count: NO GROWTH

## 2013-04-22 MED ORDER — WARFARIN SODIUM 5 MG PO TABS
5.0000 mg | ORAL_TABLET | Freq: Once | ORAL | Status: AC
  Administered 2013-04-22: 18:00:00 5 mg via ORAL
  Filled 2013-04-22: qty 1

## 2013-04-22 MED ORDER — GLUCERNA SHAKE PO LIQD: 237.0000 mL | Freq: Three times a day (TID) | ORAL | Status: DC

## 2013-04-22 NOTE — Progress Notes (Addendum)
PT Cancellation Note  Patient Details Name: [name] MRN: [id number] DOB: Oct 30, 1931   Cancelled Treatment:    Reason Eval/Treat Not Completed: Other (comment) (Wound care nurse in with pt.  Eval to be completed Mon 10/27).  Per NT, pt +2 assist just to roll and can't weight bear on feet due to pain.  Per chart pt is bed and chair bound at home.  Will evaluate tomorrow.    INGOLD,Caylen Kuwahara 04/22/2013, 11:08 AM  [facility] [phone] (pager)

## 2013-04-22 NOTE — Progress Notes (Signed)
Orthopedic Tech Progress Note Patient Details:  [name] 29-Apr-1932 [id number] Bilateral unna boots applied to LE's with assistance of RN. Application tolerated well.  Ortho Devices Type of Ortho Device: Radio broadcast assistant Ortho Device/Splint Location: Bilateral Ortho Device/Splint Interventions: Application   [name] 04/22/2013, 12:59 PM

## 2013-04-22 NOTE — Progress Notes (Signed)
ANTICOAGULATION CONSULT NOTE - Follow Up Consult  Pharmacy Consult for Coumadin Indication: atrial fibrillation  Allergies  Allergen Reactions  . Clonidine Derivatives Itching and Rash    Skin breaks out    Patient Measurements: Height: 5\' 6"  (167.6 cm) Weight: 174 lb 1.6 oz (78.971 kg) IBW/kg (Calculated) : 59.3  Vital Signs: Temp: 98.1 F (36.7 C) (10/26 0914) Temp src: Oral (10/26 0914) BP: 149/81 mmHg (10/26 0914) Pulse Rate: 86 (10/26 0914)  Labs:  Recent Labs  04/21/13 1202 04/21/13 1240 04/22/13 0555  HGB  --  8.8* 8.2*  HCT  --  27.1* 24.8*  PLT  --  199 204  APTT 41*  --   --   LABPROT 19.7*  --  22.5*  INR 1.72*  --  2.05*  CREATININE  --  1.76* 1.59*    Estimated Creatinine Clearance: 30.4 ml/min (by C-G formula based on Cr of 1.59).  Assessment:   Coumadin for chronic atrial fibrillation.   INR is now low therapeutic after increasing Coumadin dose to 10 mg on 10/25.     Home regimen:  7.5 mg MWF, 5 mg TTSS. Little PO intake. May be more sensitive to Coumadin. Anemia. No bleeding noted.  Goal of Therapy:  INR 2-3 Monitor platelets by anticoagulation protocol: Yes   Plan:   Coumadin 5 mg today. Usual Sunday dose.  Daily PT/INR.  Dennie Fetters, RPh Pager: [phone] 04/22/2013,1:49 PM

## 2013-04-22 NOTE — Social Work (Signed)
Clinical Social Work Department CLINICAL SOCIAL WORK PLACEMENT NOTE 04/22/2013  Patient:  [name], [name]  Account Number:  [id number] Admit date:  04/21/2013  Clinical Social Worker:  Robin Searing  Date/time:  04/22/2013 03:42 PM  Clinical Social Work is seeking post-discharge placement for this patient at the following level of care:   SKILLED NURSING   (*CSW will update this form in Epic as items are completed)   04/22/2013  Patient/family provided with [facility] Department of Clinical Social Work's list of facilities offering this level of care within the geographic area requested by the patient (or if unable, by the patient's family).  04/22/2013  Patient/family informed of their freedom to choose among providers that offer the needed level of care, that participate in Medicare, Medicaid or managed care program needed by the patient, have an available bed and are willing to accept the patient.  04/22/2013  Patient/family informed of [facility]' ownership interest in [facility], as well as of the fact that they are under no obligation to receive care at this facility.  PASARR submitted to [employer] on 04/22/2013 PASARR number received from [employer] on 04/22/2013  FL2 transmitted to all facilities in geographic area requested by pt/family on  04/22/2013 FL2 transmitted to all facilities within larger geographic area on   Patient informed that his/her managed care company has contracts with or will negotiate with  certain facilities, including the following:     Patient/family informed of bed offers received:   Patient chooses bed at  Physician recommends and patient chooses bed at    Patient to be transferred to  on   Patient to be transferred to facility by   The following physician request were entered in Epic:   Additional Comments:  Reece Levy, MSW, Theresia Majors [phone]

## 2013-04-22 NOTE — Progress Notes (Signed)
Pt c/o pain on her legs related to wound, Vicodin PO given as ordered. Pt resting on bed no distress noticed.

## 2013-04-22 NOTE — Social Work (Signed)
Clinical Social Work Department BRIEF PSYCHOSOCIAL ASSESSMENT 04/22/2013  Patient:  [name], [name]     Account Number:  [id number]     Admit date:  04/21/2013  Clinical Social Worker:  Robin Searing  Date/Time:  04/22/2013 03:32 PM  Referred by:  Physician  Date Referred:  04/22/2013 Referred for  SNF Placement   Other Referral:   Interview type:  Other - See comment Other interview type:   met with patient and her son- james at bedside    PSYCHOSOCIAL DATA Living Status:  FAMILY Admitted from facility:   Level of care:   Primary support name:  son and daughter  in law Primary support relationship to patient:  FAMILY Degree of support available:   good but not 24 hour care    CURRENT CONCERNS Current Concerns  Post-Acute Placement   Other Concerns:    SOCIAL WORK ASSESSMENT / PLAN Son reports that his wife and he have been residing with patient in her home- the son travels during the week and the daughter in law needs to leave town to check on her family-  The son reports that the patient has not been very mobile for the last 8 months- she has been requiring assistance by the daughter in law and at this time they want to pursue SNF for rehab. I discussed with them that her [facility] coverage may cover some SNF rehab but given her extended period of being immobile at home she may require an inpatient facility beyond what [facility] will cover- family reports they plan to apply for Medicaid this week- they also report that patient has been getting PT and OT in the home from [facility] as well as a social work visit to help look at [facility] needs/options.   Assessment/plan status:  Other - See comment Other assessment/ plan:   Complete FL2 and Pasarr for SNF search   Information/referral to community resources:   SNF  Medicaid    PATIENT'S/FAMILY'S RESPONSE TO PLAN OF CARE: Family is agreeable to SNF search- they understand the need for Medicaid and possible LTC facility if she  does not rebound-  CSW will proceed with SNF and advise- son has asked that his wife be the primary contact for thisHarriett Sine [phone] cell     Reece Levy, MSW, LCSWA [phone]/weekend coverage

## 2013-04-22 NOTE — Progress Notes (Addendum)
[facility] PROGRESS NOTE  [name]:[id number] DOB: 1931/06/29 DOA: 04/21/2013 PCP: Oliver Barre, MD  Assessment/Plan: 1. Functional decline/failure to thrive. Patient having progressive decline over the last several months, with a steep decline in the last 3 days PTA. She has multiple comorbidities. It's possible that underlying infectious process (cellulitis, UTI), dehydration, and progression of underlying disease are all contributors. Will continue empiric IV antibiotic therapy, provide supportive care. Also will continue IVF's (but rate adjusted), ask for PT/OT evaluation and advise patient to increase PO intake. 2. Chronic bilateral lower extremity ulcers. Patient was seen by Dr. Lajoyce Corners back in August at which time he felt that routine compression for venous insufficiency component could worsen arterial insufficiency and make ischemic ulcers worse. Continue wound care and treatment of superimposed cellulitis. 3. Possible lower extremity cellulitis: continue wound care. Continue vanc and unasyn. No osteomyelitis on x-rays. 4. Presumed Urinary tract infection:.will be covered by current broad spectrum abx's. Follow cx's 5. Dehydration: improved with IVF's. Will adjust IVF rate. Patient encouraged to drink more by PO 6. Chronic diastolic congestive heart failure: compensated and BNP at baseline -Continue holding lasix -Close follow up to I's and O's, daily weight -will reduce IVF's rate, patient encourage to drink more PO. 7. Atrial fibrillation: Will continue labetalol 300 mg by mouth twice a day and Cardizem 120 mg by mouth daily. Also coumadin per pharmacy 8. Anticoagulation: chronic anticoagulation for A. Fib. Continue coumadin per pharmacy 9. Type 2 diabetes mellitus: continue SSI  10. Chronic Stage II kidney disease: Patient having a baseline creatinine around 1.7 which is what she presented with on admission. -will monitor. -improved even more with hydration.  11. History of  peptic ulcer disease: Continue PPI. No hematemesis or complaints of abd pain.   Code Status: Full Family Communication: son at bedside Disposition Plan: to be determine; most likely SNF for rehab   Consultants:  PT/OT  Procedures:  See below for x-ray reports  Antibiotics: vanc unasyn  HPI/Subjective: Feeling better. No fever. Denies CP or SOB  Objective: Filed Vitals:   04/22/13 2105  BP: 135/73  Pulse: 89  Temp: 99.4 F (37.4 C)  Resp: 19    Intake/Output Summary (Last 24 hours) at 04/22/13 2241 Last data filed at 04/22/13 1805  Gross per 24 hour  Intake 2994.83 ml  Output      0 ml  Net 2994.83 ml   Filed Weights   04/21/13 1745 04/22/13 0548  Weight: 77.701 kg (171 lb 4.8 oz) 78.971 kg (174 lb 1.6 oz)    Exam:   General:  NAD, feeling better  Cardiovascular: S1 and S2, rate controlled; no rubs or gallops  Respiratory: no wheezing, no crackles  Abdomen: soft, NT, ND, positive BS  Musculoskeletal: bilat LE with unna boot wrapping, no cyanosis; cellulitis superimposed to chronic PVD ulcers.  Data Reviewed: Basic Metabolic Panel:  Recent Labs Lab 04/21/13 1240 04/22/13 0555  NA 137 139  K 4.1 3.6  CL 105 107  CO2 20 17*  GLUCOSE 154* 119*  BUN 38* 34*  CREATININE 1.76* 1.59*  CALCIUM 9.3 9.0   CBC:  Recent Labs Lab 04/21/13 1240 04/22/13 0555  WBC 8.3 8.4  NEUTROABS 7.2  --   HGB 8.8* 8.2*  HCT 27.1* 24.8*  MCV 92.2 91.2  PLT 199 204   BNP (last 3 results)  Recent Labs  01/26/13 1659 01/29/13 0530  PROBNP 3453.0* 3513.0*   CBG:  Recent Labs Lab 04/21/13 1746 04/21/13 2122 04/22/13 0604  04/22/13 1601 04/22/13 2154  GLUCAP 193* 156* 120* 188* 144*    Recent Results (from the past 240 hour(s))  URINE CULTURE     Status: None   Collection Time    04/21/13 12:58 PM      Result Value Range Status   Specimen Description URINE, CLEAN CATCH   Final   Special Requests NONE   Final   Culture  Setup Time     Final    Value: 04/21/2013 19:31     Performed at [facility] Count     Final   Value: NO GROWTH     Performed at [facility]   Culture     Final   Value: NO GROWTH     Performed at [facility]   Report Status 04/22/2013 FINAL   Final  CULTURE, BLOOD (ROUTINE X 2)     Status: None   Collection Time    04/21/13  1:25 PM      Result Value Range Status   Specimen Description BLOOD LEFT ARM   Final   Special Requests BOTTLES DRAWN AEROBIC AND ANAEROBIC 10CC   Final   Culture  Setup Time     Final   Value: 04/21/2013 17:00     Performed at [facility]   Culture     Final   Value:        BLOOD CULTURE RECEIVED NO GROWTH TO DATE CULTURE WILL BE HELD FOR 5 DAYS BEFORE ISSUING A FINAL NEGATIVE REPORT     Performed at [facility]   Report Status PENDING   Incomplete  CULTURE, BLOOD (ROUTINE X 2)     Status: None   Collection Time    04/21/13  2:20 PM      Result Value Range Status   Specimen Description BLOOD RIGHT ARM   Final   Special Requests BOTTLES DRAWN AEROBIC AND ANAEROBIC 10CCAER,5CCANA   Final   Culture  Setup Time     Final   Value: 04/21/2013 17:00     Performed at [facility]   Culture     Final   Value:        BLOOD CULTURE RECEIVED NO GROWTH TO DATE CULTURE WILL BE HELD FOR 5 DAYS BEFORE ISSUING A FINAL NEGATIVE REPORT     Performed at [facility]   Report Status PENDING   Incomplete     Studies: Dg Tibia/fibula Left  04/21/2013   CLINICAL DATA:  Osteomyelitis.  EXAM: LEFT TIBIA AND FIBULA - 2 VIEW  COMPARISON:  01/26/2013.  FINDINGS: No evidence of osteomyelitis. Osteopenia. Diabetic type small vessel atherosclerosis calcifications. Calcifications in the subcutaneous tissues similar to the contralateral side. No focal areas of skin ulceration.  IMPRESSION: No radiographic evidence of osteomyelitis.   Electronically Signed   By: Andreas Newport M.D.   On: 04/21/2013 23:07   Dg Tibia/fibula  Right  04/21/2013   CLINICAL DATA:  Leg pain. Osteomyelitis.  EXAM: RIGHT TIBIA AND FIBULA - 2 VIEW  COMPARISON:  01/26/2013 radiographs.  FINDINGS: Severe diabetic type small vessel atherosclerosis. Calcifications an these subcutaneous tissues may relate to chronic venous insufficiency, dystrophic calcifications, prior trauma or less likely scleroderma. Diffuse osteopenia. There are no areas of osteolysis. No focal soft tissue ulcerations are identified extending to bone. No gas in the soft tissues.  IMPRESSION: No evidence of osteomyelitis.   Electronically Signed   By: Andreas Newport M.D.   On: 04/21/2013  23:06   Dg Chest Portable 1 View  04/21/2013   CLINICAL DATA:  Fever and weakness.  EXAM: PORTABLE CHEST - 1 VIEW  COMPARISON:  01/26/2013  FINDINGS: There is stable cardiomegaly. Mild atelectasis present at the right base. There is no evidence of pulmonary edema, consolidation, pneumothorax, nodule or pleural fluid. The visualized skeletal structures are unremarkable.  IMPRESSION: No active disease.   Electronically Signed   By: Irish Lack M.D.   On: 04/21/2013 12:49    Scheduled Meds: . ampicillin-sulbactam (UNASYN) IV  3 g Intravenous Q8H  . atorvastatin  20 mg Oral q1800  . citalopram  20 mg Oral Daily  . collagenase   Topical Daily  . diltiazem  120 mg Oral Daily  . hydrALAZINE  50 mg Oral TID  . insulin aspart  0-15 Units Subcutaneous TID WC  . insulin aspart  0-5 Units Subcutaneous QHS  . labetalol  300 mg Oral BID  . levothyroxine  25 mcg Oral QAC breakfast  . pantoprazole  40 mg Oral Daily  . sodium chloride  3 mL Intravenous Q12H  . vancomycin  1,000 mg Intravenous Q24H  . Warfarin - Pharmacist Dosing Inpatient   Does not apply q1800   Continuous Infusions: . sodium chloride 50 mL/hr at 04/22/13 1622     Time spent: >30 minutes   Chandi Nicklin  [facility] Pager [phone]. If 7PM-7AM, please contact night-coverage at [email or web address], password  [facility] 04/22/2013, 10:41 PM  LOS: 1 day

## 2013-04-22 NOTE — Consult Note (Addendum)
WOC wound consult note Reason for Consult: Bilateral LE ulcerations.  Patient has been seen by both Dr. Lajoyce Corners and the outpatient wound care center by Dr. Jimmey Ralph.  Patient current POC has been Unna's Boots with [facility] changing them three times weekly on M-W-F.  Beneath the Unna's boot, treatment has been an enzymatic bebriding agent (collagenase) topped with a non-adherent dressing.  I will continue this POC here in acute care. Wound type:Leg ulcerations of mixed etiology (Venous insufficiency and arterial insufficiency).  I do not have a recent ABI on the chart today. Pressure Ulcer POA: No Measurement:Left lateral LE: 12.9cm x 9cm x 0.4cm.  This wound presents with an 80% red base, 20% eschar. Right lateral LE 16cm x 5cm x 0.2cm with 80% red base and 20% necrotic eschar.  Right medial wound is 13cm x 17 cm (extending posteriorially behind leg) x 0.2cm.  It presents as 60% red and 40% necrotic.  Wound bed:As described above. Drainage (amount, consistency, odor) Moderate amount of thick exudate, tan in color that is a mixture of residual  Collagenase and exudate. No odor. Periwound:Intact and covered with a thin layer of zinc oxide residual from previous Unna's Boots. Dressing procedure/placement/frequency:I will continue Dr. Lavone Neri POC (and the outpatient wound care center at [facility]) and order the topical collagenase to the LE ulcerations topped with non-adherent gauze (Telfa).  These will be changed three times weekly on T, Th, and Saturday while here, may change to M-W-F upon discharge, in conjunction with [employer] changes by ortho tech. Bedside RN relays to me today that the patient's son is considering placement in a Rehab/SNF facility at this time. WOC nursing team will not follow closely, but will remain available to this patient, the nursing and medical team.  Please re-consult if needed. Thanks, Ladona Mow, MSN, RN, GNP, Barre, CWON-AP [phone])

## 2013-04-23 ENCOUNTER — Inpatient Hospital Stay (HOSPITAL_COMMUNITY): Payer: Medicare Other

## 2013-04-23 DIAGNOSIS — E039 Hypothyroidism, unspecified: Secondary | ICD-10-CM

## 2013-04-23 LAB — GLUCOSE, CAPILLARY
Glucose-Capillary: 132 mg/dL — ABNORMAL HIGH (ref 70–99)
Glucose-Capillary: 137 mg/dL — ABNORMAL HIGH (ref 70–99)
Glucose-Capillary: 166 mg/dL — ABNORMAL HIGH (ref 70–99)

## 2013-04-23 LAB — BASIC METABOLIC PANEL
BUN: 35 mg/dL — ABNORMAL HIGH (ref 6–23)
Calcium: 8.9 mg/dL (ref 8.4–10.5)
Creatinine, Ser: 1.66 mg/dL — ABNORMAL HIGH (ref 0.50–1.10)
GFR calc Af Amer: 33 mL/min — ABNORMAL LOW (ref >90)
GFR calc non Af Amer: 28 mL/min — ABNORMAL LOW (ref >90)

## 2013-04-23 LAB — PROTIME-INR: Prothrombin Time: 32.2 seconds — ABNORMAL HIGH (ref 11.6–15.2)

## 2013-04-23 MED ORDER — FUROSEMIDE 20 MG PO TABS
20.0000 mg | ORAL_TABLET | Freq: Every day | ORAL | Status: DC
  Administered 2013-04-24 – 2013-04-25 (×2): 20 mg via ORAL
  Filled 2013-04-23 (×2): qty 1

## 2013-04-23 MED ORDER — SODIUM BICARBONATE 650 MG PO TABS
650.0000 mg | ORAL_TABLET | Freq: Every day | ORAL | Status: DC
  Administered 2013-04-23 – 2013-04-25 (×3): 650 mg via ORAL
  Filled 2013-04-23 (×3): qty 1

## 2013-04-23 MED ORDER — HYDROCODONE-ACETAMINOPHEN 7.5-325 MG PO TABS
1.0000 | ORAL_TABLET | ORAL | Status: DC | PRN
  Administered 2013-04-23 – 2013-04-24 (×3): 1 via ORAL
  Filled 2013-04-23 (×3): qty 1

## 2013-04-23 MED ORDER — BOOST / RESOURCE BREEZE PO LIQD
1.0000 | Freq: Three times a day (TID) | ORAL | Status: DC
  Administered 2013-04-23 – 2013-04-25 (×7): 1 via ORAL

## 2013-04-23 NOTE — Evaluation (Addendum)
Occupational Therapy Evaluation Patient Details Name: [name] MRN: [id number] DOB: 1932/01/09 Today's Date: 04/23/2013 Time: 4098-1191 OT Time Calculation (min): 29 min  OT Assessment / Plan / Recommendation History of present illness Bilateral lower leg cellulitis    Clinical Impression   Pt demos decline in function with ADLs and ADL mobility safety and would benefit from acute OT services to address impairments to increase level of function and safety. Pt requires 2 person assist for mobility and extensive assist with ADLs and this time. Uncertain if pt's family can provide current level of care and short term SNF is recommended for safest option    OT Assessment  Patient needs continued OT Services    Follow Up Recommendations  SNF;Supervision/Assistance - 24 hour    Barriers to Discharge Decreased caregiver support Pt lives at home with her son and dtr in law, however her son works most of the time and dtr in Social worker may not be able to provide current level of care  Equipment Recommendations  None recommended by OT;Other (comment) (TBD)    Recommendations for Other Services    Frequency  Min 2X/week    Precautions / Restrictions Precautions Precautions: Fall Required Braces or Orthoses: Other Brace/Splint Other Brace/Splint: B UNA boots Restrictions Weight Bearing Restrictions: No Other Position/Activity Restrictions: B LE ulcers   Pertinent Vitals/Pain 6/10 B LEs    ADL  Grooming: Performed;Wash/dry hands;Wash/dry face;Minimal assistance Where Assessed - Grooming: Supported sitting Upper Body Bathing: Simulated;Maximal assistance Lower Body Bathing: +1 Total assistance;Simulated Upper Body Dressing: Performed;Maximal assistance Lower Body Dressing: +1 Total assistance Toilet Transfer: Simulated;+2 Total assistance Toilet Transfer Method: Sit to stand;Stand pivot Toileting - Clothing Manipulation and Hygiene: +1 Total assistance Where Assessed - Glass blower/designer  Manipulation and Hygiene: Standing Tub/Shower Transfer Method: Not assessed Equipment Used: Gait belt ADL Comments: Pt requires extensive assist with ADLs, R UE deficits limit her ADL performance    OT Diagnosis: Generalized weakness;Acute pain  OT Problem List: Decreased strength;Decreased knowledge of use of DME or AE;Decreased range of motion;Decreased coordination;Decreased knowledge of precautions;Decreased activity tolerance;Impaired balance (sitting and/or standing);Pain;Impaired UE functional use OT Treatment Interventions: Self-care/ADL training;Patient/family education;Neuromuscular education;Balance training;Therapeutic activities;DME and/or AE instruction   OT Goals(Current goals can be found in the care plan section) Acute Rehab OT Goals Patient Stated Goal: " to go to rehab and go home " OT Goal Formulation: With patient Time For Goal Achievement: 04/30/13 Potential to Achieve Goals: Fair ADL Goals Pt Will Perform Grooming: with min guard assist;sitting Pt Will Perform Upper Body Bathing: with mod assist;sitting Pt Will Perform Lower Body Bathing: with max assist;with mod assist;sitting/lateral leans Pt Will Perform Upper Body Dressing: with mod assist;sitting Pt Will Transfer to Toilet: with total assist;with max assist;bedside commode Additional ADL Goal #1: Pt will complete bed mobility with max A  to sit EOB in prep for grooming and ADLs  Visit Information  Last OT Received On: 04/23/13 Assistance Needed: +1 PT/OT Co-Evaluation/Treatment: Yes History of Present Illness: Bilateral lower leg cellulitis        Prior Functioning     Home Living Family/patient expects to be discharged to:: Skilled nursing facility Living Arrangements: Children Prior Function Level of Independence: Independent with assistive device(s) Comments: Pt stated up until a few weeks ago she was able to transfer to her w/c from bed using RW and was going into the bathroom for bathing and  dressing. Ptr states that her dtr in law has since been assisting her Communication Communication: No difficulties  Dominant Hand: Left         Vision/Perception Vision - History Baseline Vision: Wears glasses all the time Patient Visual Report: No change from baseline Perception Perception: Within Functional Limits   Cognition  Cognition Arousal/Alertness: Awake/alert Behavior During Therapy: WFL for tasks assessed/performed Overall Cognitive Status: Within Functional Limits for tasks assessed    Extremity/Trunk Assessment Upper Extremity Assessment Upper Extremity Assessment: Overall WFL for tasks assessed;Generalized weakness;RUE deficits/detail RUE Deficits / Details: R wrist drop, weakness, AROM deficits R shoulder (pt states that she thinks her wrist drop and R UE weakness started after her fall but she doesn't really remember) RUE Coordination: decreased fine motor;decreased gross motor Lower Extremity Assessment Lower Extremity Assessment: Defer to PT evaluation Cervical / Trunk Assessment Cervical / Trunk Assessment: Kyphotic     Mobility Bed Mobility Bed Mobility: Supine to Sit;Sitting - Scoot to Edge of Bed Supine to Sit: 1: +2 Total assist Sitting - Scoot to Edge of Bed: 1: +2 Total assist Transfers Transfers: Sit to Stand;Stand to Sit Sit to Stand: 1: +2 Total assist;From bed Stand to Sit: To chair/3-in-1;1: +2 Total assist     Exercise     Balance Balance Balance Assessed: No   End of Session OT - End of Session Equipment Utilized During Treatment: Gait belt Activity Tolerance: Patient limited by pain;Patient limited by fatigue Patient left: in chair;with call bell/phone within reach Nurse Communication: Mobility status  GO     Galen Manila 04/23/2013, 1:04 PM

## 2013-04-23 NOTE — Progress Notes (Addendum)
[facility] PROGRESS NOTE  KALAN RINN OZH:[id number] DOB: [birth year] DOA: 04/21/2013 PCP: Oliver Barre, MD  Assessment/Plan: 1. Functional decline/failure to thrive. Patient having progressive decline over the last several months, with a steep decline in the last 3 days PTA. She has multiple comorbidities. It's possible that underlying infectious process (cellulitis, UTI), dehydration, and progression of underlying disease are all contributors. Will continue empiric IV antibiotic therapy while inpatient, provide supportive care. Also will continue IVF's (but rate adjusted), PT/OT evaluation recommending SNF. Patient to increase PO intake. 2. Chronic bilateral lower extremity ulcers. Patient was seen by Dr. Lajoyce Corners back in August at which time he felt that routine compression for venous insufficiency component could worsen arterial insufficiency and make ischemic ulcers worse. Continue wound care and treatment for superimposed cellulitis. 3. Possible lower extremity cellulitis: continue wound care. Continue vanc and unasyn. No osteomyelitis on x-rays. 4. Presumed Urinary tract infection:.will be covered by current broad spectrum abx's. Follow cx's 5. Dehydration: improved with IVF's. Will adjust IVF rate. Patient encouraged to drink more by PO 6. Chronic diastolic congestive heart failure: compensated and BNP at baseline -Continue holding lasix -Close follow up to I's and O's, daily weight -will reduce IVF's rate, patient encourage to drink more PO. 7. Atrial fibrillation: Will continue labetalol 300 mg by mouth twice a day and Cardizem 120 mg by mouth daily. Also coumadin per pharmacy 8. Anticoagulation: chronic anticoagulation for A. Fib. Continue coumadin per pharmacy 9. Type 2 diabetes mellitus: continue SSI  10. Chronic Stage II kidney disease: Patient having a baseline creatinine around 1.7 which is what she presented with on admission. -will monitor. -improved even more with hydration.   11. History of peptic ulcer disease: Continue PPI. No hematemesis or complaints of abd pain.   Code Status: Full Family Communication: son at bedside Disposition Plan: to be determine; most likely SNF for rehab   Consultants:  PT/OT  Procedures:  See below for x-ray reports  Antibiotics: vanc unasyn  HPI/Subjective: Feeling better overall. No fever. Denies CP or SOB. No osteomyelitis on LE x-ray. Still awfully weak on right side. Will ordered MRI brain as planned in outpatient setting and follow results.  Objective: Filed Vitals:   04/23/13 1831  BP: 108/63  Pulse:   Temp:   Resp:     Intake/Output Summary (Last 24 hours) at 04/23/13 1933 Last data filed at 04/23/13 1318  Gross per 24 hour  Intake 1735.67 ml  Output      0 ml  Net 1735.67 ml   Filed Weights   04/21/13 1745 04/22/13 0548 04/23/13 0517  Weight: 77.701 kg (171 lb 4.8 oz) 78.971 kg (174 lb 1.6 oz) 83.643 kg (184 lb 6.4 oz)    Exam:   General:  NAD, feeling better  Cardiovascular: S1 and S2, rate controlled; no rubs or gallops  Respiratory: no wheezing, no crackles  Abdomen: soft, NT, ND, positive BS  Musculoskeletal: bilat LE with unna boot wrapping, no cyanosis; cellulitis superimposed to chronic PVD ulcers.  Data Reviewed: Basic Metabolic Panel:  Recent Labs Lab 04/21/13 1240 04/22/13 0555 04/23/13 0530  NA 137 139 136  K 4.1 3.6 4.2  CL 105 107 104  CO2 20 17* 18*  GLUCOSE 154* 119* 133*  BUN 38* 34* 35*  CREATININE 1.76* 1.59* 1.66*  CALCIUM 9.3 9.0 8.9   CBC:  Recent Labs Lab 04/21/13 1240 04/22/13 0555  WBC 8.3 8.4  NEUTROABS 7.2  --   HGB 8.8* 8.2*  HCT 27.1* 24.8*  MCV 92.2 91.2  PLT 199 204   BNP (last 3 results)  Recent Labs  01/26/13 1659 01/29/13 0530  PROBNP 3453.0* 3513.0*   CBG:  Recent Labs Lab 04/22/13 2154 04/23/13 0546 04/23/13 1148 04/23/13 1150 04/23/13 1624  GLUCAP 144* 132* 137* 154* 166*    Recent Results (from the past  240 hour(s))  URINE CULTURE     Status: None   Collection Time    04/21/13 12:58 PM      Result Value Range Status   Specimen Description URINE, CLEAN CATCH   Final   Special Requests NONE   Final   Culture  Setup Time     Final   Value: 04/21/2013 19:31     Performed at [facility] Count     Final   Value: NO GROWTH     Performed at [facility]   Culture     Final   Value: NO GROWTH     Performed at [facility]   Report Status 04/22/2013 FINAL   Final  CULTURE, BLOOD (ROUTINE X 2)     Status: None   Collection Time    04/21/13  1:25 PM      Result Value Range Status   Specimen Description BLOOD LEFT ARM   Final   Special Requests BOTTLES DRAWN AEROBIC AND ANAEROBIC 10CC   Final   Culture  Setup Time     Final   Value: 04/21/2013 17:00     Performed at [facility]   Culture     Final   Value:        BLOOD CULTURE RECEIVED NO GROWTH TO DATE CULTURE WILL BE HELD FOR 5 DAYS BEFORE ISSUING A FINAL NEGATIVE REPORT     Performed at [facility]   Report Status PENDING   Incomplete  CULTURE, BLOOD (ROUTINE X 2)     Status: None   Collection Time    04/21/13  2:20 PM      Result Value Range Status   Specimen Description BLOOD RIGHT ARM   Final   Special Requests BOTTLES DRAWN AEROBIC AND ANAEROBIC 10CCAER,5CCANA   Final   Culture  Setup Time     Final   Value: 04/21/2013 17:00     Performed at [facility]   Culture     Final   Value:        BLOOD CULTURE RECEIVED NO GROWTH TO DATE CULTURE WILL BE HELD FOR 5 DAYS BEFORE ISSUING A FINAL NEGATIVE REPORT     Performed at [facility]   Report Status PENDING   Incomplete     Studies: Dg Tibia/fibula Left  04/21/2013   CLINICAL DATA:  Osteomyelitis.  EXAM: LEFT TIBIA AND FIBULA - 2 VIEW  COMPARISON:  01/26/2013.  FINDINGS: No evidence of osteomyelitis. Osteopenia. Diabetic type small vessel atherosclerosis calcifications. Calcifications in the subcutaneous tissues  similar to the contralateral side. No focal areas of skin ulceration.  IMPRESSION: No radiographic evidence of osteomyelitis.   Electronically Signed   By: Andreas Newport M.D.   On: 04/21/2013 23:07   Dg Tibia/fibula Right  04/21/2013   CLINICAL DATA:  Leg pain. Osteomyelitis.  EXAM: RIGHT TIBIA AND FIBULA - 2 VIEW  COMPARISON:  01/26/2013 radiographs.  FINDINGS: Severe diabetic type small vessel atherosclerosis. Calcifications an these subcutaneous tissues may relate to chronic venous insufficiency, dystrophic calcifications, prior trauma or less likely scleroderma. Diffuse osteopenia. There are no areas of  osteolysis. No focal soft tissue ulcerations are identified extending to bone. No gas in the soft tissues.  IMPRESSION: No evidence of osteomyelitis.   Electronically Signed   By: Andreas Newport M.D.   On: 04/21/2013 23:06    Scheduled Meds: . ampicillin-sulbactam (UNASYN) IV  3 g Intravenous Q8H  . atorvastatin  20 mg Oral q1800  . citalopram  20 mg Oral Daily  . collagenase   Topical Daily  . diltiazem  120 mg Oral Daily  . feeding supplement (RESOURCE BREEZE)  1 Container Oral TID BM  . [START ON 04/24/2013] furosemide  20 mg Oral Daily  . hydrALAZINE  50 mg Oral TID  . insulin aspart  0-15 Units Subcutaneous TID WC  . insulin aspart  0-5 Units Subcutaneous QHS  . labetalol  300 mg Oral BID  . levothyroxine  25 mcg Oral QAC breakfast  . pantoprazole  40 mg Oral Daily  . sodium bicarbonate  650 mg Oral Daily  . sodium chloride  3 mL Intravenous Q12H  . vancomycin  1,000 mg Intravenous Q24H  . Warfarin - Pharmacist Dosing Inpatient   Does not apply q1800   Continuous Infusions: . sodium chloride 10 mL/hr (04/23/13 0900)    Time spent: >30 minutes   Yolette Hastings  [facility] Pager [phone]. If 7PM-7AM, please contact night-coverage at [email or web address], password [facility] 04/23/2013, 7:33 PM  LOS: 2 days

## 2013-04-23 NOTE — Progress Notes (Signed)
ANTICOAGULATION CONSULT NOTE - Follow Up Consult  Pharmacy Consult for Couamdin Indication: atrial fibrillation  Allergies  Allergen Reactions  . Clonidine Derivatives Itching and Rash    Skin breaks out    Patient Measurements: Height: 5\' 6"  (167.6 cm) Weight: 184 lb 6.4 oz (83.643 kg) IBW/kg (Calculated) : 59.3 Heparin Dosing Weight:   Vital Signs: Temp: 98.9 F (37.2 C) (10/27 0517) BP: 113/63 mmHg (10/27 0517) Pulse Rate: 85 (10/27 0517)  Labs:  Recent Labs  04/21/13 1202 04/21/13 1240 04/22/13 0555 04/23/13 0530  HGB  --  8.8* 8.2*  --   HCT  --  27.1* 24.8*  --   PLT  --  199 204  --   APTT 41*  --   --   --   LABPROT 19.7*  --  22.5* 32.2*  INR 1.72*  --  2.05* 3.28*  CREATININE  --  1.76* 1.59* 1.66*    Estimated Creatinine Clearance: 29.9 ml/min (by C-G formula based on Cr of 1.66).   Medications:  Scheduled:  . ampicillin-sulbactam (UNASYN) IV  3 g Intravenous Q8H  . atorvastatin  20 mg Oral q1800  . citalopram  20 mg Oral Daily  . collagenase   Topical Daily  . diltiazem  120 mg Oral Daily  . feeding supplement (RESOURCE BREEZE)  1 Container Oral TID BM  . [START ON 04/24/2013] furosemide  20 mg Oral Daily  . hydrALAZINE  50 mg Oral TID  . insulin aspart  0-15 Units Subcutaneous TID WC  . insulin aspart  0-5 Units Subcutaneous QHS  . labetalol  300 mg Oral BID  . levothyroxine  25 mcg Oral QAC breakfast  . pantoprazole  40 mg Oral Daily  . sodium bicarbonate  650 mg Oral Daily  . sodium chloride  3 mL Intravenous Q12H  . vancomycin  1,000 mg Intravenous Q24H  . Warfarin - Pharmacist Dosing Inpatient   Does not apply q1800    Assessment: 79yo female with AFib.  INR with large jump to 3.28 this AM; pt on antibiotics.  No bleeding problems noted.  No CBC this AM.  Goal of Therapy:  INR 2-3 Monitor platelets by anticoagulation protocol: Yes   Plan:  1.  No Coumadin today 2.  F/U in AM  Marisue Humble, PharmD Clinical Pharmacist [facility]

## 2013-04-23 NOTE — Progress Notes (Signed)
Patient is currently active with [facility] Care Management for chronic disease management services.  Patient has been engaged by a [employer].  She is a recent referral.  We have experienced difficulty in maintaining contact with her family.  Spoke with her niece at bedside who will assume primary contact responsibilities Jaquita Folds [phone]). Patient will receive a post discharge transition of care call and will be evaluated for monthly home visits for assessments and disease process education.  Made Inpatient Case Manager aware that [facility] following. Of note, [facility] Care Management services does not replace or interfere with any services that are arranged by inpatient case management or social work.  For additional questions or referrals please contact Anibal Henderson BSN RN [facility] Liaison at [phone].

## 2013-04-23 NOTE — Progress Notes (Signed)
[facility]  Patient Status: Active (receiving services up to time of hospitalization)  [facility] is providing the following services: RN and HHA  If patient discharges after hours, please call [phone].   [name] 04/23/2013, 6:46 PM

## 2013-04-23 NOTE — Progress Notes (Signed)
Physical Therapy Evaluation Patient Details Name: [name] MRN: [id number] DOB: 1932/05/01 Today's Date: 04/23/2013 Time: 5284-1324 PT Time Calculation (min): 30 min  PT Assessment / Plan / Recommendation History of Present Illness  Pt admit with UTI and bil LE cellulitis.    Clinical Impression  Pt admitted with above. Pt currently with functional limitations due to the deficits listed below (see PT Problem List). Pt will need NHP for therapy prior to d/c home as she needs to gain strength and mobility prior to d/c home.  Pt will benefit from skilled PT to increase their independence and safety with mobility to allow discharge to the venue listed below.     PT Assessment  Patient needs continued PT services    Follow Up Recommendations  SNF;Supervision/Assistance - 24 hour         Barriers to Discharge Decreased caregiver support      Equipment Recommendations  None recommended by PT         Frequency Min 3X/week    Precautions / Restrictions Precautions Precautions: Fall Required Braces or Orthoses: Other Brace/Splint Other Brace/Splint: B UNA boots Restrictions Weight Bearing Restrictions: No Other Position/Activity Restrictions: B LE ulcers   Pertinent Vitals/Pain VSS, Pain bil LEs      Mobility  Bed Mobility Bed Mobility: Supine to Sit;Sitting - Scoot to Edge of Bed Supine to Sit: 1: +2 Total assist Supine to Sit: Patient Percentage: 10% Sitting - Scoot to Edge of Bed: 1: +2 Total assist Sitting - Scoot to Edge of Bed: Patient Percentage: 0% Details for Bed Mobility Assistance: Needed assist and cues for elevation of trunk as well as moving LEs to EOB.  Used pad to assist with moving pt as she had a lot of difficulty assisting with transition to EOB.   Transfers Transfers: Sit to Stand;Stand to Sit;Squat Pivot Transfers Sit to Stand: 1: +2 Total assist;From bed Sit to Stand: Patient Percentage: 40% Stand to Sit: To chair/3-in-1;1: +2 Total assist Stand  to Sit: Patient Percentage: 40% Squat Pivot Transfers: 1: +2 Total assist;With upper extremity assistance Squat Pivot Transfers: Patient Percentage: 50% Details for Transfer Assistance: Pt needed cues for hand placement.  Pt also needed LEs blocked as they were sliding out from under her if not blocked.  Used pad to assist with trunk and hip extension.  Pt unable to achieve full upright stance due to weakness therefore performed squat pivot transfer.   Ambulation/Gait Ambulation/Gait Assistance: Not tested (comment) Stairs: No Wheelchair Mobility Wheelchair Mobility: No    Exercises General Exercises - Lower Extremity Ankle Circles/Pumps: AROM;Both;10 reps;Seated Long Arc Quad: AROM;Both;10 reps;Seated Hip Flexion/Marching: AROM;Both;10 reps;Seated   PT Diagnosis: Generalized weakness;Acute pain  PT Problem List: Decreased strength;Decreased activity tolerance;Decreased balance;Decreased mobility;Decreased knowledge of use of DME;Decreased safety awareness;Decreased knowledge of precautions;Pain PT Treatment Interventions: DME instruction;Gait training;Functional mobility training;Therapeutic activities;Therapeutic exercise;Neuromuscular re-education;Balance training;Patient/family education     PT Goals(Current goals can be found in the care plan section) Acute Rehab PT Goals Patient Stated Goal: " to go to rehab and go home " PT Goal Formulation: With patient Time For Goal Achievement: 05/07/13 Potential to Achieve Goals: Good  Visit Information  Last PT Received On: 04/23/13 Assistance Needed: +2 PT/OT Co-Evaluation/Treatment: Yes History of Present Illness: Pt admit with UTI and bil LE cellulitis.         Prior Functioning  Home Living Family/patient expects to be discharged to:: Skilled nursing facility Living Arrangements: Children Prior Function Level of Independence: Independent with assistive device(s)  Comments: Pt stated up until a few weeks ago she was able to  transfer to her w/c from bed using RW and was going into the bathroom for bathing and dressing. Ptr states that her dtr in law has since been assisting her Communication Communication: No difficulties Dominant Hand: Left    Cognition  Cognition Arousal/Alertness: Awake/alert Behavior During Therapy: WFL for tasks assessed/performed Overall Cognitive Status: Within Functional Limits for tasks assessed    Extremity/Trunk Assessment Upper Extremity Assessment Upper Extremity Assessment: Defer to OT evaluation RUE Deficits / Details: R wrist drop, weakness, AROM deficits R shoulder (pt states that she thinks her wrist drop and R UE weakness started after her fall but she doesn't really remember) RUE Coordination: decreased fine motor;decreased gross motor Lower Extremity Assessment Lower Extremity Assessment: RLE deficits/detail;LLE deficits/detail RLE Deficits / Details: grossly 3-/5 LLE Deficits / Details: grossly 3-/5 Cervical / Trunk Assessment Cervical / Trunk Assessment: Kyphotic   Balance Balance Balance Assessed: No Static Sitting Balance Static Sitting - Balance Support: Bilateral upper extremity supported;Feet supported Static Sitting - Level of Assistance: 5: Stand by assistance Static Sitting - Comment/# of Minutes: 2  End of Session PT - End of Session Equipment Utilized During Treatment: Gait belt Activity Tolerance: Patient limited by fatigue;Patient limited by pain Patient left: in chair;with call bell/phone within reach Nurse Communication: Mobility status;Need for lift equipment       [name],[name] 04/23/2013, 2:00 PM [facility] [phone] (pager)

## 2013-04-23 NOTE — Progress Notes (Signed)
INITIAL NUTRITION ASSESSMENT  DOCUMENTATION CODES Per approved criteria  -Not Applicable   INTERVENTION: Resource Breeze po TID, each supplement provides 250 kcal and 9 grams of protein.  Continue to encourage PO intake.  NUTRITION DIAGNOSIS: Inadequate oral intake related to FTT and functional decline as evidenced by reported intake less than estimated needs.   Goal: Pt to meet >/= 90% of their estimated nutrition needs   Monitor:  Weight, po intake, labs  Reason for Assessment: MST  79 y.o. female  Admitting Dx: <principal problem not specified>  ASSESSMENT: Pt with multiple comorbidities including chronic bilateral extremity ulcers, peripheral vascular disease, venous stasis insufficiency, type 2 diabetes.   Pt reports that she will not accept Glucerna because she does not like it. Breakfast tray was in room untouched and pt said that she only eats one or two bites per meal. She reports her UBW as 163 lbs. Pt was very lethargic upon RD visit.  Height: Ht Readings from Last 1 Encounters:  04/21/13 5\' 6"  (1.676 m)    Weight: Wt Readings from Last 1 Encounters:  04/23/13 184 lb 6.4 oz (83.643 kg)    Ideal Body Weight: 59.3 kg  % Ideal Body Weight: 141%  Wt Readings from Last 10 Encounters:  04/23/13 184 lb 6.4 oz (83.643 kg)  04/05/13 178 lb (80.74 kg)  02/13/13 189 lb 2 oz (85.787 kg)  01/27/13 189 lb 2.5 oz (85.8 kg)  11/28/12 163 lb (73.936 kg)  07/13/12 163 lb (73.936 kg)  03/23/12 160 lb 6.4 oz (72.757 kg)  01/11/12 153 lb 6 oz (69.57 kg)  07/13/11 188 lb (85.276 kg)  05/18/11 177 lb (80.287 kg)    Usual Body Weight: 163 lbs  % Usual Body Weight: 113%  BMI:  Body mass index is 29.78 kg/(m^2).  Estimated Nutritional Needs: Kcal: 2200-2400 Protein: 100-110 g Fluid: >2.2 L  Skin: Bilateral lower extremity cellulitis  Diet Order: Cardiac  EDUCATION NEEDS: -Education not appropriate at this time   Intake/Output Summary (Last 24 hours) at  04/23/13 0852 Last data filed at 04/23/13 0625  Gross per 24 hour  Intake 3680.5 ml  Output      0 ml  Net 3680.5 ml    Last BM: none recorded   Labs:   Recent Labs Lab 04/21/13 1240 04/22/13 0555 04/23/13 0530  NA 137 139 136  K 4.1 3.6 4.2  CL 105 107 104  CO2 20 17* 18*  BUN 38* 34* 35*  CREATININE 1.76* 1.59* 1.66*  CALCIUM 9.3 9.0 8.9  GLUCOSE 154* 119* 133*    CBG (last 3)   Recent Labs  04/22/13 1601 04/22/13 2154 04/23/13 0546  GLUCAP 188* 144* 132*    Scheduled Meds: . ampicillin-sulbactam (UNASYN) IV  3 g Intravenous Q8H  . atorvastatin  20 mg Oral q1800  . citalopram  20 mg Oral Daily  . collagenase   Topical Daily  . diltiazem  120 mg Oral Daily  . feeding supplement (GLUCERNA SHAKE)  237 mL Oral TID BM  . [START ON 04/24/2013] furosemide  20 mg Oral Daily  . hydrALAZINE  50 mg Oral TID  . insulin aspart  0-15 Units Subcutaneous TID WC  . insulin aspart  0-5 Units Subcutaneous QHS  . labetalol  300 mg Oral BID  . levothyroxine  25 mcg Oral QAC breakfast  . pantoprazole  40 mg Oral Daily  . sodium bicarbonate  650 mg Oral Daily  . sodium chloride  3 mL Intravenous Q12H  .  vancomycin  1,000 mg Intravenous Q24H  . Warfarin - Pharmacist Dosing Inpatient   Does not apply q1800    Continuous Infusions: . sodium chloride 50 mL/hr at 04/22/13 1622    Past Medical History  Diagnosis Date  . Obesity   . Anemia, iron deficiency   . Depression   . Diabetes mellitus type II   . Hypertension   . Peptic ulcer   . History of uterine fibroid   . Hx of colonic polyps   . Diverticula, colon   . Osteoarthritis   . Low back pain   . Atrial fibrillation     permanent, on coumadin (followed by [facility] coumadin clinic)  . Pulmonary hypertension     echo 4/12:  EF 55-60%, mod LVH, mild BAE, RVE and PASP 51  . Anxiety   . DIABETES MELLITUS, TYPE II 03/05/2007  . HYPERLIPIDEMIA 03/05/2007  . ANEMIA-IRON DEFICIENCY 03/05/2007  . HYPERTENSION 03/05/2007  .  DEPRESSION 03/05/2007  . LOW BACK PAIN 03/05/2007  . PERIPHERAL EDEMA 04/29/2008  . Chronic pain syndrome 06/23/2010  . FIBROIDS, UTERUS 03/05/2007  . PEPTIC ULCER DISEASE 03/05/2007  . DIVERTICULOSIS, COLON 03/05/2007  . OSTEOARTHRITIS 03/05/2007  . COLONIC POLYPS, HX OF 03/05/2007  . Hypothyroidism 10/15/2010  . Renal insufficiency 10/15/2010  . Atrial fibrillation     Past Surgical History  Procedure Laterality Date  . Laparoscopic hysterectomy      Ebbie Latus RD, LDN

## 2013-04-24 DIAGNOSIS — R799 Abnormal finding of blood chemistry, unspecified: Secondary | ICD-10-CM

## 2013-04-24 LAB — PROTIME-INR
INR: 4.03 — ABNORMAL HIGH (ref 0.00–1.49)
Prothrombin Time: 37.7 seconds — ABNORMAL HIGH (ref 11.6–15.2)

## 2013-04-24 LAB — GLUCOSE, CAPILLARY
Glucose-Capillary: 184 mg/dL — ABNORMAL HIGH (ref 70–99)
Glucose-Capillary: 139 mg/dL — ABNORMAL HIGH (ref 70–99)
Glucose-Capillary: 228 mg/dL — ABNORMAL HIGH (ref 70–99)
Glucose-Capillary: 144 mg/dL — ABNORMAL HIGH (ref 70–99)

## 2013-04-24 MED ORDER — AMOXICILLIN-POT CLAVULANATE 500-125 MG PO TABS
1.0000 | ORAL_TABLET | Freq: Two times a day (BID) | ORAL | Status: DC
  Administered 2013-04-24 – 2013-04-25 (×3): 500 mg via ORAL
  Filled 2013-04-24 (×4): qty 1

## 2013-04-24 NOTE — Progress Notes (Signed)
ANTICOAGULATION CONSULT NOTE - Follow Up Consult  Pharmacy Consult for Coumadin Indication: atrial fibrillation  Allergies  Allergen Reactions  . Clonidine Derivatives Itching and Rash    Skin breaks out    Patient Measurements: Height: 5\' 6"  (167.6 cm) Weight: 182 lb 1.6 oz (82.6 kg) IBW/kg (Calculated) : 59.3 Heparin Dosing Weight:   Vital Signs: Temp: 98.5 F (36.9 C) (10/28 0539) Temp src: Oral (10/28 0539) BP: 127/61 mmHg (10/28 0539) Pulse Rate: 79 (10/28 0539)  Labs:  Recent Labs  04/21/13 1202 04/21/13 1240 04/22/13 0555 04/23/13 0530 04/24/13 0535  HGB  --  8.8* 8.2*  --   --   HCT  --  27.1* 24.8*  --   --   PLT  --  199 204  --   --   APTT 41*  --   --   --   --   LABPROT 19.7*  --  22.5* 32.2* 37.7*  INR 1.72*  --  2.05* 3.28* 4.03*  CREATININE  --  1.76* 1.59* 1.66*  --     Estimated Creatinine Clearance: 29.8 ml/min (by C-G formula based on Cr of 1.66).   Medications:  Scheduled:  . ampicillin-sulbactam (UNASYN) IV  3 g Intravenous Q8H  . atorvastatin  20 mg Oral q1800  . citalopram  20 mg Oral Daily  . collagenase   Topical Daily  . diltiazem  120 mg Oral Daily  . feeding supplement (RESOURCE BREEZE)  1 Container Oral TID BM  . furosemide  20 mg Oral Daily  . hydrALAZINE  50 mg Oral TID  . insulin aspart  0-15 Units Subcutaneous TID WC  . insulin aspart  0-5 Units Subcutaneous QHS  . labetalol  300 mg Oral BID  . levothyroxine  25 mcg Oral QAC breakfast  . pantoprazole  40 mg Oral Daily  . sodium bicarbonate  650 mg Oral Daily  . sodium chloride  3 mL Intravenous Q12H  . vancomycin  1,000 mg Intravenous Q24H  . Warfarin - Pharmacist Dosing Inpatient   Does not apply q1800    Assessment: 79yo female with AFib.  INR with another large increase today to 4.03.  She did receive Coumadin yesterday, antibiotic therapy may be increasing Coumadin effect.  No bleeding noted.  Goal of Therapy:  INR 2-3 Monitor platelets by anticoagulation  protocol: Yes   Plan:  1.  No Coumadin today 2.  F/U in AM  Marisue Humble, PharmD Clinical Pharmacist [facility]

## 2013-04-24 NOTE — Progress Notes (Signed)
Repositioned q 2 hrs. No complaints offered.continued to monitor pt

## 2013-04-24 NOTE — Clinical Documentation Improvement (Signed)
THIS DOCUMENT IS NOT A PERMANENT PART OF THE MEDICAL RECORD  Please update your documentation with the medical record to reflect your response to this query.   If you need help knowing how to do this please call [phone].  04/24/13  Dr. Gwenlyn Perking,  In an effort to better capture your patient's severity of illness, reflect appropriate length of stay and utilization of resources, a review of the patient medical record has revealed the following conflicting information:   - "Chronic Diastolic Heart Failure" documented in H&P and progress notes    - "Acute on Chronic Diastolic Heart Failure" also documented in H&P and progress notes.      - However, it is documented in the H&P and progress notes that the patient "does not appear to have clinical         signs or symptoms of acute CHF".   Based on your independent clinical judgment, please clarify and document the appropriate ACUITY AND TYPE of Heart Failure monitored and treated this admission:   - Chronic Diastolic heart Failure   - Other Acuity and Type of Heart Failure   - Unable to Clinically Determine   The fact queries are asked, does not imply that any particular answer is desired or expected  Conditions documented as possible, probable, or suspected can be coded if documented as such in the discharge summary, unless confirmed or ruled out.   Thank You,  Jerral Ralph  RN BSN CCDS Certified Clinical Documentation Specialist: [phone] Health Information Management [place]

## 2013-04-24 NOTE — Consult Note (Signed)
WOC contacted bedside nursing to verify Unna's boots to be changed today in coordination with the orthopedic tech. Bedside nurse to apply topical care as ordered. MAustin RN,CWOCN

## 2013-04-24 NOTE — Progress Notes (Signed)
Bed offers discussed with patient and her daughter in law [name]  [phone]) and they have chosen [facility]. Per admissions at [facility]- they have a bed available for patient. Per Dr. Gwenlyn Perking- plan d/c to SNF tomorrow if medically stable.  CSW will monitor.  Lorri Frederick. [place]  [phone]

## 2013-04-24 NOTE — Progress Notes (Addendum)
[facility] PROGRESS NOTE  [name]:[id number] DOB: May 05, 1932 DOA: 04/21/2013 PCP: Oliver Barre, MD  Assessment/Plan: 1. Functional decline/failure to thrive. Patient having progressive decline over the last several months, with a steep decline in the last 3 days PTA. She has multiple comorbidities. It's possible that underlying infectious process (cellulitis, UTI), dehydration, and progression of underlying disease are all contributors. Will continue Treatment for UTI/cellulitis with augmentin; provide supportive care. Also following PT/OT evaluation recommendations will arrange discharge to SNF. Patient advise to increase PO intake. 2. Chronic bilateral lower extremity ulcers. Patient was seen by Dr. Lajoyce Corners back in August at which time he felt that routine compression for venous insufficiency component could worsen arterial insufficiency and make ischemic ulcers worse. Continue wound care and treatment for superimposed cellulitis with PO antibiotics. Will need follow up with ortho/wound care at discharge 3. Possible lower extremity cellulitis: continue wound care. Continue augmentin. No osteomyelitis on x-rays. 4. Presumed Urinary tract infection:will be covered by current abx therapy. 5. Dehydration: improved with IVF's. Will continue encouraging PO intake. IVF's change to KVO 6. Chronic diastolic congestive heart failure: compensated and BNP at baseline -will resume low dose lasix -Close follow up to I's and O's, daily weight -IVF's change to KVO 7. Atrial fibrillation: Will continue labetalol 300 mg by mouth twice a day and Cardizem 120 mg by mouth daily. Also coumadin per pharmacy 8. Supra-therapeutic INR: no signs of overt bleeding; INR elevated due to abx's most likely. Will hold coumadin today follow INR in am. 9. Anticoagulation: chronic anticoagulation for A. Fib. Continue coumadin per pharmacy. INR climbing, will follow closely. 10. Type 2 diabetes mellitus: continue SSI   11. Chronic Stage II kidney disease: Patient having a baseline creatinine around 1.7 which is what she presented with on admission. -will monitor. -improved even more with hydration on admission 12. History of peptic ulcer disease: Continue PPI. No hematemesis or complaints of abd pain. 13. Physical deconditioning and right side weakness: will discharge to SNF for rehab.  -MRI has r/o stroke -secondary protection provided by coumadin.   Code Status: Full Family Communication: son at bedside Disposition Plan: SNF for rehab   Consultants:  PT/OT  Procedures:  See below for x-ray reports  Antibiotics: vanc unasyn  HPI/Subjective: Feeling better overall. No fever. Denies CP or SOB. No osteomyelitis on LE x-ray. Still awfully weak (generalized) but mainly R side. MRI r/o strokes.  Objective: Filed Vitals:   04/24/13 1008  BP: 121/54  Pulse: 79  Temp:   Resp: 20    Intake/Output Summary (Last 24 hours) at 04/24/13 1319 Last data filed at 04/24/13 0600  Gross per 24 hour  Intake    120 ml  Output      0 ml  Net    120 ml   Filed Weights   04/22/13 0548 04/23/13 0517 04/24/13 0539  Weight: 78.971 kg (174 lb 1.6 oz) 83.643 kg (184 lb 6.4 oz) 82.6 kg (182 lb 1.6 oz)    Exam:   General:  NAD, feeling better  Cardiovascular: S1 and S2, rate controlled; no rubs or gallops  Respiratory: no wheezing, no crackles  Abdomen: soft, NT, ND, positive BS  Musculoskeletal: bilat LE with unna boot wrapping, no cyanosis; cellulitis superimposed to chronic PVD ulcers.  Data Reviewed: Basic Metabolic Panel:  Recent Labs Lab 04/21/13 1240 04/22/13 0555 04/23/13 0530  NA 137 139 136  K 4.1 3.6 4.2  CL 105 107 104  CO2 20 17* 18*  GLUCOSE 154*  119* 133*  BUN 38* 34* 35*  CREATININE 1.76* 1.59* 1.66*  CALCIUM 9.3 9.0 8.9   CBC:  Recent Labs Lab 04/21/13 1240 04/22/13 0555  WBC 8.3 8.4  NEUTROABS 7.2  --   HGB 8.8* 8.2*  HCT 27.1* 24.8*  MCV 92.2 91.2  PLT  199 204   BNP (last 3 results)  Recent Labs  01/26/13 1659 01/29/13 0530  PROBNP 3453.0* 3513.0*   CBG:  Recent Labs Lab 04/23/13 1150 04/23/13 1624 04/23/13 2252 04/24/13 0554 04/24/13 1128  GLUCAP 154* 166* 184* 139* 228*    Recent Results (from the past 240 hour(s))  URINE CULTURE     Status: None   Collection Time    04/21/13 12:58 PM      Result Value Range Status   Specimen Description URINE, CLEAN CATCH   Final   Special Requests NONE   Final   Culture  Setup Time     Final   Value: 04/21/2013 19:31     Performed at [facility] Count     Final   Value: NO GROWTH     Performed at [facility]   Culture     Final   Value: NO GROWTH     Performed at [facility]   Report Status 04/22/2013 FINAL   Final  CULTURE, BLOOD (ROUTINE X 2)     Status: None   Collection Time    04/21/13  1:25 PM      Result Value Range Status   Specimen Description BLOOD LEFT ARM   Final   Special Requests BOTTLES DRAWN AEROBIC AND ANAEROBIC 10CC   Final   Culture  Setup Time     Final   Value: 04/21/2013 17:00     Performed at [facility]   Culture     Final   Value:        BLOOD CULTURE RECEIVED NO GROWTH TO DATE CULTURE WILL BE HELD FOR 5 DAYS BEFORE ISSUING A FINAL NEGATIVE REPORT     Performed at [facility]   Report Status PENDING   Incomplete  CULTURE, BLOOD (ROUTINE X 2)     Status: None   Collection Time    04/21/13  2:20 PM      Result Value Range Status   Specimen Description BLOOD RIGHT ARM   Final   Special Requests BOTTLES DRAWN AEROBIC AND ANAEROBIC 10CCAER,5CCANA   Final   Culture  Setup Time     Final   Value: 04/21/2013 17:00     Performed at [facility]   Culture     Final   Value:        BLOOD CULTURE RECEIVED NO GROWTH TO DATE CULTURE WILL BE HELD FOR 5 DAYS BEFORE ISSUING A FINAL NEGATIVE REPORT     Performed at [facility]   Report Status PENDING   Incomplete     Studies: Mr Shirlee Latch Wo Contrast  04/24/2013   CLINICAL DATA:  Failure to thrive.  Stroke.  EXAM: MRI HEAD WITHOUT CONTRAST  MRA HEAD WITHOUT CONTRAST  TECHNIQUE: Multiplanar, multiecho pulse sequences of the brain and surrounding structures were obtained without intravenous contrast. Angiographic images of the head were obtained using MRA technique without contrast.  COMPARISON:  CT head without contrast 04/05/2013.  FINDINGS: MRI HEAD FINDINGS  The diffusion-weighted images demonstrate no evidence for acute or subacute infarction. Moderate generalized atrophy and diffuse white matter disease is present.  Remote lacunar infarcts are evident within the basilar ganglia bilaterally.  Flow is present in the major intracranial arteries. 3 or 4 areas of punctate susceptibility are noted, compatible with remote punctate hemorrhage. This raises the possibility of vasculitis.  No acute hemorrhage or mass lesion is present. The globes and orbits are intact. Mild mucosal thickening is present in the ethmoid air cells. The remaining paranasal sinuses and the mastoid air cells are clear. Degenerative grade 1 retrolisthesis is present at C2-3 and C3-4. Chronic endplate changes are present at C2-3.  MRA HEAD FINDINGS  The study is markedly degraded by patient motion. This sequence is particularly sensitive to motion. This significantly degrades the study. Asymmetric signal loss is present in the distal cavernous and supra clinoid left internal carotid artery. This may represent significant stenosis. The A1 and M1 segments appear to be intact. There is significant attenuation of branch vessels.  The left vertebral artery is slightly dominant to the right. There is some narrowing of the distal right vertebral artery. The basilar artery is intact. The right posterior cerebral artery appears to be a fetal type. There is likely a small right P1 segment. There is significant signal loss in the distal branch vessels.  IMPRESSION: MRI HEAD IMPRESSION   1. No acute intracranial abnormality. 2. Atrophy and diffuse white matter disease. This likely reflects the sequelae of chronic microvascular ischemia. 3. Punctate areas of remote hemorrhage are concerning for amyloid angiography or other chronic vasculitis. 4. Marked degenerative changes within the upper cervical spine.  MRA HEAD IMPRESSION  1. The study is significantly degraded by patient motion, degrading evaluation of media min small vessels. 2. Probable narrowing of the distal cavernous and supra clinoid left internal carotid artery. 3. Fetal type right posterior cerebral artery.   Electronically Signed   By: Gennette Pac M.D.   On: 04/24/2013 08:35   Mr Brain Wo Contrast  04/24/2013   CLINICAL DATA:  Failure to thrive.  Stroke.  EXAM: MRI HEAD WITHOUT CONTRAST  MRA HEAD WITHOUT CONTRAST  TECHNIQUE: Multiplanar, multiecho pulse sequences of the brain and surrounding structures were obtained without intravenous contrast. Angiographic images of the head were obtained using MRA technique without contrast.  COMPARISON:  CT head without contrast 04/05/2013.  FINDINGS: MRI HEAD FINDINGS  The diffusion-weighted images demonstrate no evidence for acute or subacute infarction. Moderate generalized atrophy and diffuse white matter disease is present. Remote lacunar infarcts are evident within the basilar ganglia bilaterally.  Flow is present in the major intracranial arteries. 3 or 4 areas of punctate susceptibility are noted, compatible with remote punctate hemorrhage. This raises the possibility of vasculitis.  No acute hemorrhage or mass lesion is present. The globes and orbits are intact. Mild mucosal thickening is present in the ethmoid air cells. The remaining paranasal sinuses and the mastoid air cells are clear. Degenerative grade 1 retrolisthesis is present at C2-3 and C3-4. Chronic endplate changes are present at C2-3.  MRA HEAD FINDINGS  The study is markedly degraded by patient motion. This sequence is  particularly sensitive to motion. This significantly degrades the study. Asymmetric signal loss is present in the distal cavernous and supra clinoid left internal carotid artery. This may represent significant stenosis. The A1 and M1 segments appear to be intact. There is significant attenuation of branch vessels.  The left vertebral artery is slightly dominant to the right. There is some narrowing of the distal right vertebral artery. The basilar artery is intact. The right posterior cerebral artery appears to  be a fetal type. There is likely a small right P1 segment. There is significant signal loss in the distal branch vessels.  IMPRESSION: MRI HEAD IMPRESSION  1. No acute intracranial abnormality. 2. Atrophy and diffuse white matter disease. This likely reflects the sequelae of chronic microvascular ischemia. 3. Punctate areas of remote hemorrhage are concerning for amyloid angiography or other chronic vasculitis. 4. Marked degenerative changes within the upper cervical spine.  MRA HEAD IMPRESSION  1. The study is significantly degraded by patient motion, degrading evaluation of media min small vessels. 2. Probable narrowing of the distal cavernous and supra clinoid left internal carotid artery. 3. Fetal type right posterior cerebral artery.   Electronically Signed   By: Gennette Pac M.D.   On: 04/24/2013 08:35    Scheduled Meds: . amoxicillin-clavulanate  1 tablet Oral Q12H  . atorvastatin  20 mg Oral q1800  . citalopram  20 mg Oral Daily  . collagenase   Topical Daily  . diltiazem  120 mg Oral Daily  . feeding supplement (RESOURCE BREEZE)  1 Container Oral TID BM  . furosemide  20 mg Oral Daily  . hydrALAZINE  50 mg Oral TID  . insulin aspart  0-15 Units Subcutaneous TID WC  . insulin aspart  0-5 Units Subcutaneous QHS  . labetalol  300 mg Oral BID  . levothyroxine  25 mcg Oral QAC breakfast  . pantoprazole  40 mg Oral Daily  . sodium bicarbonate  650 mg Oral Daily  . sodium chloride  3 mL  Intravenous Q12H  . Warfarin - Pharmacist Dosing Inpatient   Does not apply q1800   Continuous Infusions: . sodium chloride 10 mL/hr (04/24/13 1007)     Time spent: >30 minutes   Sotero Brinkmeyer  [facility] Pager [phone]. If 7PM-7AM, please contact night-coverage at [email or web address], password [facility] 04/24/2013, 1:19 PM  LOS: 3 days

## 2013-04-24 NOTE — Progress Notes (Signed)
UR completed. Suzanne Garbers RN CCM Case Mgmt 

## 2013-04-25 LAB — CBC
HCT: 26.8 % — ABNORMAL LOW (ref 36.0–46.0)
Hemoglobin: 8.9 g/dL — ABNORMAL LOW (ref 12.0–15.0)
MCHC: 33.2 g/dL (ref 30.0–36.0)
MCV: 91.5 fL (ref 78.0–100.0)
Platelets: 247 10*3/uL (ref 150–400)
RDW: 15.5 % (ref 11.5–15.5)
WBC: 9.8 10*3/uL (ref 4.0–10.5)

## 2013-04-25 LAB — BASIC METABOLIC PANEL
BUN: 37 mg/dL — ABNORMAL HIGH (ref 6–23)
Chloride: 105 mEq/L (ref 96–112)
Creatinine, Ser: 1.67 mg/dL — ABNORMAL HIGH (ref 0.50–1.10)
GFR calc Af Amer: 33 mL/min — ABNORMAL LOW (ref >90)
GFR calc non Af Amer: 28 mL/min — ABNORMAL LOW (ref >90)
Glucose, Bld: 205 mg/dL — ABNORMAL HIGH (ref 70–99)

## 2013-04-25 LAB — GLUCOSE, CAPILLARY
Glucose-Capillary: 187 mg/dL — ABNORMAL HIGH (ref 70–99)
Glucose-Capillary: 278 mg/dL — ABNORMAL HIGH (ref 70–99)

## 2013-04-25 MED ORDER — SODIUM BICARBONATE 650 MG PO TABS: 650.0000 mg | ORAL_TABLET | Freq: Three times a day (TID) | ORAL | Status: DC

## 2013-04-25 MED ORDER — DOXYCYCLINE HYCLATE 100 MG PO TABS: 100.0000 mg | ORAL_TABLET | Freq: Two times a day (BID) | ORAL | Status: DC

## 2013-04-25 MED ORDER — ALPRAZOLAM 0.25 MG PO TABS: 0.2500 mg | ORAL_TABLET | Freq: Two times a day (BID) | ORAL | Status: DC | PRN

## 2013-04-25 MED ORDER — FUROSEMIDE 40 MG PO TABS: 20.0000 mg | ORAL_TABLET | Freq: Every day | ORAL | Status: DC

## 2013-04-25 MED ORDER — DOXYCYCLINE HYCLATE 100 MG PO TABS
100.0000 mg | ORAL_TABLET | Freq: Two times a day (BID) | ORAL | Status: DC
  Filled 2013-04-25: qty 1

## 2013-04-25 MED ORDER — DOXYCYCLINE HYCLATE 100 MG IV SOLR
100.0000 mg | Freq: Once | INTRAVENOUS | Status: AC
  Administered 2013-04-25: 100 mg via INTRAVENOUS
  Filled 2013-04-25: qty 100

## 2013-04-25 MED ORDER — ATORVASTATIN CALCIUM 20 MG PO TABS: 20.0000 mg | ORAL_TABLET | Freq: Every day | ORAL | Status: AC

## 2013-04-25 NOTE — Progress Notes (Signed)
[facility] PROGRESS NOTE Assessment/Plan: Functional decline/failure to thrive.  - Patient having progressive decline over the last several months, with a steep decline in the last 3 days PTA.  - It's possible that underlying infectious process (cellulitis), dehydration, and progression of underlying disease are all contributors.  - PT/OT evaluation recommended  discharge to SNF.   Chronic bilateral lower extremity ulcers.  - Patient was seen by Dr. Lajoyce Corners back in August at which time he felt that routine compression for venous insufficiency component could worsen arterial insufficiency and make ischemic ulcers worse. Continue PO antibiotics.  - Will need follow up with ortho/wound care at discharge  Possible lower extremity cellulitis:  - continue wound care. Change to dozycycline.. No osteomyelitis on x-rays.  Presumed Urinary tract infection: - UC negative. Stop ampicillin.  Chronic diastolic congestive heart failure: -  compensated and BNP at baseline -will resume low dose lasix  -Close follow up to I's and O's, daily weight   Atrial fibrillation:  Will continue labetalol 300 mg by mouth twice a day and Cardizem 120 mg by mouth daily. Also coumadin per pharmacy.  Supra-therapeutic INR:  - No signs of overt bleeding; INR elevated due to abx's most likely.  - Will hold coumadin today follow INR in am.  Type 2 diabetes mellitus:  - continue SSI   Chronic Stage II kidney disease: - Patient having a baseline creatinine around 1.7 which is what she presented with on admission. -will monitor.  -improved even more with hydration on admission   History of peptic ulcer disease:  - Continue PPI. No hematemesis or complaints of abd pain.  Physical deconditioning and right side weakness:  will discharge to SNF for rehab.     Code Status: Full  Family Communication: son at bedside  Disposition Plan: SNF for  rehab    Consultants:  none  Procedures:  none  Antibiotics:  doxy (indicate start date, and stop date if known)  HPI/Subjective: No complains  Objective: Filed Vitals:   04/24/13 1730 04/24/13 2047 04/25/13 0506 04/25/13 1020  BP: 111/72 133/73 154/74 130/76  Pulse:  87 84 77  Temp:  98.6 F (37 C) 97.7 F (36.5 C)   TempSrc:  Oral Oral   Resp:  18 17 18   Height:      Weight:   83.5 kg (184 lb 1.4 oz)   SpO2:  98% 98% 100%    Intake/Output Summary (Last 24 hours) at 04/25/13 1021 Last data filed at 04/24/13 2059  Gross per 24 hour  Intake    949 ml  Output      0 ml  Net    949 ml   Filed Weights   04/23/13 0517 04/24/13 0539 04/25/13 0506  Weight: 83.643 kg (184 lb 6.4 oz) 82.6 kg (182 lb 1.6 oz) 83.5 kg (184 lb 1.4 oz)    Exam:  General: Alert, awake, oriented x2, in no acute distress.  HEENT: No bruits, no goiter.  Heart: Regular rate and rhythm, without murmurs, rubs, gallops.  Lungs: Good air movement, bilateral air movement.  Abdomen: Soft, nontender, nondistended, positive bowel sounds.     Data Reviewed: Basic Metabolic Panel:  Recent Labs Lab 04/21/13 1240 04/22/13 0555 04/23/13 0530 04/25/13 0633  NA 137 139 136 135  K 4.1 3.6 4.2 3.4*  CL 105 107 104 105  CO2 20 17* 18* 17*  GLUCOSE 154* 119* 133* 205*  BUN 38* 34* 35* 37*  CREATININE 1.76* 1.59* 1.66* 1.67*  CALCIUM 9.3  9.0 8.9 9.5   Liver Function Tests: No results found for this basename: AST, ALT, ALKPHOS, BILITOT, PROT, ALBUMIN,  in the last 168 hours No results found for this basename: LIPASE, AMYLASE,  in the last 168 hours No results found for this basename: AMMONIA,  in the last 168 hours CBC:  Recent Labs Lab 04/21/13 1240 04/22/13 0555 04/25/13 0633  WBC 8.3 8.4 9.8  NEUTROABS 7.2  --   --   HGB 8.8* 8.2* 8.9*  HCT 27.1* 24.8* 26.8*  MCV 92.2 91.2 91.5  PLT 199 204 247   Cardiac Enzymes: No results found for this basename: CKTOTAL, CKMB, CKMBINDEX,  TROPONINI,  in the last 168 hours BNP (last 3 results)  Recent Labs  01/26/13 1659 01/29/13 0530  PROBNP 3453.0* 3513.0*   CBG:  Recent Labs Lab 04/24/13 0554 04/24/13 1128 04/24/13 1707 04/24/13 2126 04/25/13 0553  GLUCAP 139* 228* 239* 144* 187*    Recent Results (from the past 240 hour(s))  URINE CULTURE     Status: None   Collection Time    04/21/13 12:58 PM      Result Value Range Status   Specimen Description URINE, CLEAN CATCH   Final   Special Requests NONE   Final   Culture  Setup Time     Final   Value: 04/21/2013 19:31     Performed at [facility] Count     Final   Value: NO GROWTH     Performed at [facility]   Culture     Final   Value: NO GROWTH     Performed at [facility]   Report Status 04/22/2013 FINAL   Final  CULTURE, BLOOD (ROUTINE X 2)     Status: None   Collection Time    04/21/13  1:25 PM      Result Value Range Status   Specimen Description BLOOD LEFT ARM   Final   Special Requests BOTTLES DRAWN AEROBIC AND ANAEROBIC 10CC   Final   Culture  Setup Time     Final   Value: 04/21/2013 17:00     Performed at [facility]   Culture     Final   Value:        BLOOD CULTURE RECEIVED NO GROWTH TO DATE CULTURE WILL BE HELD FOR 5 DAYS BEFORE ISSUING A FINAL NEGATIVE REPORT     Performed at [facility]   Report Status PENDING   Incomplete  CULTURE, BLOOD (ROUTINE X 2)     Status: None   Collection Time    04/21/13  2:20 PM      Result Value Range Status   Specimen Description BLOOD RIGHT ARM   Final   Special Requests BOTTLES DRAWN AEROBIC AND ANAEROBIC 10CCAER,5CCANA   Final   Culture  Setup Time     Final   Value: 04/21/2013 17:00     Performed at [facility]   Culture     Final   Value:        BLOOD CULTURE RECEIVED NO GROWTH TO DATE CULTURE WILL BE HELD FOR 5 DAYS BEFORE ISSUING A FINAL NEGATIVE REPORT     Performed at [facility]   Report Status PENDING   Incomplete      Studies: Mr Shirlee Latch Wo Contrast  04/24/2013   CLINICAL DATA:  Failure to thrive.  Stroke.  EXAM: MRI HEAD WITHOUT CONTRAST  MRA HEAD WITHOUT CONTRAST  TECHNIQUE:  Multiplanar, multiecho pulse sequences of the brain and surrounding structures were obtained without intravenous contrast. Angiographic images of the head were obtained using MRA technique without contrast.  COMPARISON:  CT head without contrast 04/05/2013.  FINDINGS: MRI HEAD FINDINGS  The diffusion-weighted images demonstrate no evidence for acute or subacute infarction. Moderate generalized atrophy and diffuse white matter disease is present. Remote lacunar infarcts are evident within the basilar ganglia bilaterally.  Flow is present in the major intracranial arteries. 3 or 4 areas of punctate susceptibility are noted, compatible with remote punctate hemorrhage. This raises the possibility of vasculitis.  No acute hemorrhage or mass lesion is present. The globes and orbits are intact. Mild mucosal thickening is present in the ethmoid air cells. The remaining paranasal sinuses and the mastoid air cells are clear. Degenerative grade 1 retrolisthesis is present at C2-3 and C3-4. Chronic endplate changes are present at C2-3.  MRA HEAD FINDINGS  The study is markedly degraded by patient motion. This sequence is particularly sensitive to motion. This significantly degrades the study. Asymmetric signal loss is present in the distal cavernous and supra clinoid left internal carotid artery. This may represent significant stenosis. The A1 and M1 segments appear to be intact. There is significant attenuation of branch vessels.  The left vertebral artery is slightly dominant to the right. There is some narrowing of the distal right vertebral artery. The basilar artery is intact. The right posterior cerebral artery appears to be a fetal type. There is likely a small right P1 segment. There is significant signal loss in the distal branch vessels.   IMPRESSION: MRI HEAD IMPRESSION  1. No acute intracranial abnormality. 2. Atrophy and diffuse white matter disease. This likely reflects the sequelae of chronic microvascular ischemia. 3. Punctate areas of remote hemorrhage are concerning for amyloid angiography or other chronic vasculitis. 4. Marked degenerative changes within the upper cervical spine.  MRA HEAD IMPRESSION  1. The study is significantly degraded by patient motion, degrading evaluation of media min small vessels. 2. Probable narrowing of the distal cavernous and supra clinoid left internal carotid artery. 3. Fetal type right posterior cerebral artery.   Electronically Signed   By: Gennette Pac M.D.   On: 04/24/2013 08:35   Mr Brain Wo Contrast  04/24/2013   CLINICAL DATA:  Failure to thrive.  Stroke.  EXAM: MRI HEAD WITHOUT CONTRAST  MRA HEAD WITHOUT CONTRAST  TECHNIQUE: Multiplanar, multiecho pulse sequences of the brain and surrounding structures were obtained without intravenous contrast. Angiographic images of the head were obtained using MRA technique without contrast.  COMPARISON:  CT head without contrast 04/05/2013.  FINDINGS: MRI HEAD FINDINGS  The diffusion-weighted images demonstrate no evidence for acute or subacute infarction. Moderate generalized atrophy and diffuse white matter disease is present. Remote lacunar infarcts are evident within the basilar ganglia bilaterally.  Flow is present in the major intracranial arteries. 3 or 4 areas of punctate susceptibility are noted, compatible with remote punctate hemorrhage. This raises the possibility of vasculitis.  No acute hemorrhage or mass lesion is present. The globes and orbits are intact. Mild mucosal thickening is present in the ethmoid air cells. The remaining paranasal sinuses and the mastoid air cells are clear. Degenerative grade 1 retrolisthesis is present at C2-3 and C3-4. Chronic endplate changes are present at C2-3.  MRA HEAD FINDINGS  The study is markedly degraded by  patient motion. This sequence is particularly sensitive to motion. This significantly degrades the study. Asymmetric signal loss is present in the distal  cavernous and supra clinoid left internal carotid artery. This may represent significant stenosis. The A1 and M1 segments appear to be intact. There is significant attenuation of branch vessels.  The left vertebral artery is slightly dominant to the right. There is some narrowing of the distal right vertebral artery. The basilar artery is intact. The right posterior cerebral artery appears to be a fetal type. There is likely a small right P1 segment. There is significant signal loss in the distal branch vessels.  IMPRESSION: MRI HEAD IMPRESSION  1. No acute intracranial abnormality. 2. Atrophy and diffuse white matter disease. This likely reflects the sequelae of chronic microvascular ischemia. 3. Punctate areas of remote hemorrhage are concerning for amyloid angiography or other chronic vasculitis. 4. Marked degenerative changes within the upper cervical spine.  MRA HEAD IMPRESSION  1. The study is significantly degraded by patient motion, degrading evaluation of media min small vessels. 2. Probable narrowing of the distal cavernous and supra clinoid left internal carotid artery. 3. Fetal type right posterior cerebral artery.   Electronically Signed   By: Gennette Pac M.D.   On: 04/24/2013 08:35    Scheduled Meds: . amoxicillin-clavulanate  1 tablet Oral Q12H  . atorvastatin  20 mg Oral q1800  . citalopram  20 mg Oral Daily  . collagenase   Topical Daily  . diltiazem  120 mg Oral Daily  . feeding supplement (RESOURCE BREEZE)  1 Container Oral TID BM  . furosemide  20 mg Oral Daily  . hydrALAZINE  50 mg Oral TID  . insulin aspart  0-15 Units Subcutaneous TID WC  . insulin aspart  0-5 Units Subcutaneous QHS  . labetalol  300 mg Oral BID  . levothyroxine  25 mcg Oral QAC breakfast  . pantoprazole  40 mg Oral Daily  . sodium bicarbonate  650 mg Oral  Daily  . sodium chloride  3 mL Intravenous Q12H  . Warfarin - Pharmacist Dosing Inpatient   Does not apply q1800   Continuous Infusions: . sodium chloride Stopped (04/24/13 2059)     Marinda Elk  [facility] Pager [phone]. If 8PM-8AM, please contact night-coverage at [email or web address], password [facility] 04/25/2013, 10:21 AM  LOS: 4 days

## 2013-04-25 NOTE — Progress Notes (Signed)
I cosign for Emily Hipp's (SN) assessment, I & O, care plan, and education.  

## 2013-04-25 NOTE — Discharge Summary (Signed)
Physician Discharge Summary  [name] EAV:[id number] DOB: [birth year] DOA: 04/21/2013  PCP: Oliver Barre, MD  Admit date: 04/21/2013 Discharge date: 04/25/2013  Time spent: 35 minutes  Recommendations for Outpatient Follow-up:  1. Follow up with PCP in 1 week.  Discharge Diagnoses:  Active Problems:   DIABETES MELLITUS, TYPE II   HYPERLIPIDEMIA   FIBRILLATION, ATRIAL   Acute on chronic diastolic heart failure   CKD (chronic kidney disease) stage 2, GFR 60-89 ml/min   UTI (lower urinary tract infection)   Fever and chills   Failure to thrive   Venous stasis ulcers   Discharge Condition: stable  Diet recommendation: heart healthy  Filed Weights   04/23/13 0517 04/24/13 0539 04/25/13 0506  Weight: 83.643 kg (184 lb 6.4 oz) 82.6 kg (182 lb 1.6 oz) 83.5 kg (184 lb 1.4 oz)    History of present illness:  79 y.o. female with multiple comorbidities including chronic bilateral lower extremity ulcers, peripheral vascular disease, venous stasis insufficiency, type 2 diabetes, who was admitted to her service back on 01/26/2013 for her lower extremity cellulitis. Imaging studies at that time did not show evidence for osteomyelitis. She was discharged in stable condition on oral antibiotic therapy with doxycycline. Patient presented to the emergent apartment with complaints of having an overall functional decline, failure to thrive the last several weeks. Patient has had progressive generalized weakness, fatigue, minimal by mouth intake, becoming less active and interactive with family members. She had a temperature of 100.6 yesterday   Hospital Course:  Functional decline/failure to thrive.  - Patient having progressive decline over the last several months, with a steep decline in the last 3 days PTA.  - It's possible that underlying infectious process (cellulitis), dehydration, and progression of underlying disease are all contributors.  - PT/OT evaluation recommended discharge to SNF.    Chronic bilateral lower extremity ulcers.  - Patient was seen by Dr. Lajoyce Corners back in August at which time he felt that routine compression for venous insufficiency component could worsen arterial insufficiency and make ischemic ulcers worse. - Continue PO antibiotics.  - Will need follow up with ortho/wound care at discharge   Possible lower extremity cellulitis:  - continue wound care. Change to doxycycline. No osteomyelitis on x-rays.   Presumed Urinary tract infection:  - UC negative. Stop ampicillin.   Chronic diastolic congestive heart failure:  - compensated and BNP at baseline  -will resume low dose lasix  -Close follow up to I's and O's, daily weight   Atrial fibrillation:  Will continue labetalol 300 mg by mouth twice a day and Cardizem 120 mg by mouth daily. Also coumadin per pharmacy.   Supra-therapeutic INR:  - No signs of overt bleeding; INR elevated due to abx's most likely.  - Will hold coumadin today follow INR in am.   Type 2 diabetes mellitus:  - continue SSI   Chronic Stage II kidney disease:  - Patient having a baseline creatinine around 1.7 which is what she presented with on admission.  -will monitor.  -improved even more with hydration on admission   History of peptic ulcer disease:  - Continue PPI. No hematemesis or complaints of abd pain.   Procedures: MRI of brain: No acute intracranial abnormality.  Atrophy and diffuse white matter disease. This likely reflects the sequelae of chronic microvascular ischemia.    Consultations:  none  Discharge Exam: Filed Vitals:   04/25/13 1020  BP: 130/76  Pulse: 77  Temp:   Resp: 18  General: A&O x1 Cardiovascular: rrr Respiratory: Good air movement CTA B/L  Discharge Instructions      Discharge Orders   Future Appointments Provider Department Dept Phone   07/20/2013 2:00 PM Nilda Riggs, NP [facility] [phone]   08/14/2013 10:45 AM Corwin Levins, MD [facility]  [facility] -[facility] [phone]   Future Orders Complete By Expires   Diet - low sodium heart healthy  As directed    Increase activity slowly  As directed        Medication List    STOP taking these medications       levofloxacin 500 MG tablet  Commonly known as:  LEVAQUIN     metFORMIN 500 MG tablet  Commonly known as:  GLUCOPHAGE      TAKE these medications       ALPRAZolam 0.25 MG tablet  Commonly known as:  XANAX  Take 1 tablet (0.25 mg total) by mouth 2 (two) times daily as needed. For anxiety     atorvastatin 20 MG tablet  Commonly known as:  LIPITOR  Take 1 tablet (20 mg total) by mouth daily at 6 PM.     citalopram 20 MG tablet  Commonly known as:  CELEXA  Take 20 mg by mouth daily.     collagenase ointment  Commonly known as:  SANTYL  Apply 1 application topically daily.     diltiazem 120 MG 24 hr capsule  Commonly known as:  CARDIZEM CD  Take 120 mg by mouth daily.     docusate sodium 100 MG capsule  Commonly known as:  COLACE  Take 100 mg by mouth 2 (two) times daily as needed. For constipation     doxycycline 100 MG tablet  Commonly known as:  VIBRA-TABS  Take 1 tablet (100 mg total) by mouth every 12 (twelve) hours.     furosemide 40 MG tablet  Commonly known as:  LASIX  Take 0.5 tablets (20 mg total) by mouth daily.     hydrALAZINE 50 MG tablet  Commonly known as:  APRESOLINE  Take 50 mg by mouth 3 (three) times daily.     HYDROcodone-acetaminophen 7.5-325 MG per tablet  Commonly known as:  NORCO  Take 1 tablet by mouth every 6 (six) hours as needed for pain.     labetalol 300 MG tablet  Commonly known as:  NORMODYNE  Take 300 mg by mouth 2 (two) times daily.     levothyroxine 25 MCG tablet  Commonly known as:  SYNTHROID, LEVOTHROID  Take 25 mcg by mouth daily before breakfast.     lovastatin 40 MG tablet  Commonly known as:  MEVACOR  Take 40 mg by mouth 2 (two) times daily.     ondansetron 4 MG tablet  Commonly known  as:  ZOFRAN  Take 1 tablet (4 mg total) by mouth every 8 (eight) hours as needed for nausea.     sodium bicarbonate 650 MG tablet  Take 1 tablet (650 mg total) by mouth 3 (three) times daily.     Vitamin D 1000 UNITS capsule  Take 1,000 Units by mouth daily.     warfarin 5 MG tablet  Commonly known as:  COUMADIN  Take 5-7.5 mg by mouth daily. Take 7.5mg  on Monday, Wednesday, Friday. Take 5mg  the rest of the week.       Allergies  Allergen Reactions  . Clonidine Derivatives Itching and Rash    Skin breaks out      The  results of significant diagnostics from this hospitalization (including imaging, microbiology, ancillary and laboratory) are listed below for reference.    Significant Diagnostic Studies: Dg Tibia/fibula Left  04/21/2013   CLINICAL DATA:  Osteomyelitis.  EXAM: LEFT TIBIA AND FIBULA - 2 VIEW  COMPARISON:  01/26/2013.  FINDINGS: No evidence of osteomyelitis. Osteopenia. Diabetic type small vessel atherosclerosis calcifications. Calcifications in the subcutaneous tissues similar to the contralateral side. No focal areas of skin ulceration.  IMPRESSION: No radiographic evidence of osteomyelitis.   Electronically Signed   By: Andreas Newport M.D.   On: 04/21/2013 23:07   Dg Tibia/fibula Right  04/21/2013   CLINICAL DATA:  Leg pain. Osteomyelitis.  EXAM: RIGHT TIBIA AND FIBULA - 2 VIEW  COMPARISON:  01/26/2013 radiographs.  FINDINGS: Severe diabetic type small vessel atherosclerosis. Calcifications an these subcutaneous tissues may relate to chronic venous insufficiency, dystrophic calcifications, prior trauma or less likely scleroderma. Diffuse osteopenia. There are no areas of osteolysis. No focal soft tissue ulcerations are identified extending to bone. No gas in the soft tissues.  IMPRESSION: No evidence of osteomyelitis.   Electronically Signed   By: Andreas Newport M.D.   On: 04/21/2013 23:06   Ct Head Wo Contrast  04/05/2013   CLINICAL DATA:  Not using right arm, left  hand and the closed position  EXAM: CT HEAD WITHOUT CONTRAST  TECHNIQUE: Contiguous axial images were obtained from the base of the skull through the vertex without intravenous contrast.  COMPARISON:  04/02/2013  FINDINGS: Stable diffuse atrophy. Stable low attenuation in the deep white matter. Small chronic lacunar infarct left basal ganglia, stable. No acute vascular territory infarct. No hemorrhage or extra-axial fluid. No skull fracture.  IMPRESSION: No acute abnormalities. Chronic involutional change again identified.   Electronically Signed   By: Esperanza Heir M.D.   On: 04/05/2013 17:12   Ct Head Wo Contrast  04/02/2013   CLINICAL DATA:  Larey Seat in bathtub  EXAM: CT HEAD WITHOUT CONTRAST  CT CERVICAL SPINE WITHOUT CONTRAST  TECHNIQUE: Multidetector CT imaging of the head and cervical spine was performed following the standard protocol without intravenous contrast. Multiplanar CT image reconstructions of the cervical spine were also generated.  COMPARISON:  12/19/2012  FINDINGS: CT HEAD FINDINGS  Chronic atrophy and chronic small vessel ischemic change in the deep white matter. Chronic subcentimeter lacunar infarct left basal ganglia and bilateral thalamus. No hemorrhage or extra-axial fluid. No skull fracture.  CT CERVICAL SPINE FINDINGS  No fracture. Near normal anterior-posterior alignment with mild multilevel listhesis due to severe degenerative change. There is in particular is severe degenerative disc disease throughout the cervical spine. There is no prevertebral soft tissue swelling.  IMPRESSION: CT HEAD IMPRESSION  No acute traumatic injury  CT CERVICAL SPINE IMPRESSION  Severe chronic degenerative change. No acute traumatic injury   Electronically Signed   By: Esperanza Heir M.D.   On: 04/02/2013 14:41   Ct Cervical Spine Wo Contrast  04/02/2013   CLINICAL DATA:  Larey Seat in bathtub  EXAM: CT HEAD WITHOUT CONTRAST  CT CERVICAL SPINE WITHOUT CONTRAST  TECHNIQUE: Multidetector CT imaging of the head  and cervical spine was performed following the standard protocol without intravenous contrast. Multiplanar CT image reconstructions of the cervical spine were also generated.  COMPARISON:  12/19/2012  FINDINGS: CT HEAD FINDINGS  Chronic atrophy and chronic small vessel ischemic change in the deep white matter. Chronic subcentimeter lacunar infarct left basal ganglia and bilateral thalamus. No hemorrhage or extra-axial fluid. No skull fracture.  CT  CERVICAL SPINE FINDINGS  No fracture. Near normal anterior-posterior alignment with mild multilevel listhesis due to severe degenerative change. There is in particular is severe degenerative disc disease throughout the cervical spine. There is no prevertebral soft tissue swelling.  IMPRESSION: CT HEAD IMPRESSION  No acute traumatic injury  CT CERVICAL SPINE IMPRESSION  Severe chronic degenerative change. No acute traumatic injury   Electronically Signed   By: Esperanza Heir M.D.   On: 04/02/2013 14:41   Mr Maxine Glenn Head Wo Contrast  04/24/2013   CLINICAL DATA:  Failure to thrive.  Stroke.  EXAM: MRI HEAD WITHOUT CONTRAST  MRA HEAD WITHOUT CONTRAST  TECHNIQUE: Multiplanar, multiecho pulse sequences of the brain and surrounding structures were obtained without intravenous contrast. Angiographic images of the head were obtained using MRA technique without contrast.  COMPARISON:  CT head without contrast 04/05/2013.  FINDINGS: MRI HEAD FINDINGS  The diffusion-weighted images demonstrate no evidence for acute or subacute infarction. Moderate generalized atrophy and diffuse white matter disease is present. Remote lacunar infarcts are evident within the basilar ganglia bilaterally.  Flow is present in the major intracranial arteries. 3 or 4 areas of punctate susceptibility are noted, compatible with remote punctate hemorrhage. This raises the possibility of vasculitis.  No acute hemorrhage or mass lesion is present. The globes and orbits are intact. Mild mucosal thickening is  present in the ethmoid air cells. The remaining paranasal sinuses and the mastoid air cells are clear. Degenerative grade 1 retrolisthesis is present at C2-3 and C3-4. Chronic endplate changes are present at C2-3.  MRA HEAD FINDINGS  The study is markedly degraded by patient motion. This sequence is particularly sensitive to motion. This significantly degrades the study. Asymmetric signal loss is present in the distal cavernous and supra clinoid left internal carotid artery. This may represent significant stenosis. The A1 and M1 segments appear to be intact. There is significant attenuation of branch vessels.  The left vertebral artery is slightly dominant to the right. There is some narrowing of the distal right vertebral artery. The basilar artery is intact. The right posterior cerebral artery appears to be a fetal type. There is likely a small right P1 segment. There is significant signal loss in the distal branch vessels.  IMPRESSION: MRI HEAD IMPRESSION  1. No acute intracranial abnormality. 2. Atrophy and diffuse white matter disease. This likely reflects the sequelae of chronic microvascular ischemia. 3. Punctate areas of remote hemorrhage are concerning for amyloid angiography or other chronic vasculitis. 4. Marked degenerative changes within the upper cervical spine.  MRA HEAD IMPRESSION  1. The study is significantly degraded by patient motion, degrading evaluation of media min small vessels. 2. Probable narrowing of the distal cavernous and supra clinoid left internal carotid artery. 3. Fetal type right posterior cerebral artery.   Electronically Signed   By: Gennette Pac M.D.   On: 04/24/2013 08:35   Mr Brain Wo Contrast  04/24/2013   CLINICAL DATA:  Failure to thrive.  Stroke.  EXAM: MRI HEAD WITHOUT CONTRAST  MRA HEAD WITHOUT CONTRAST  TECHNIQUE: Multiplanar, multiecho pulse sequences of the brain and surrounding structures were obtained without intravenous contrast. Angiographic images of the  head were obtained using MRA technique without contrast.  COMPARISON:  CT head without contrast 04/05/2013.  FINDINGS: MRI HEAD FINDINGS  The diffusion-weighted images demonstrate no evidence for acute or subacute infarction. Moderate generalized atrophy and diffuse white matter disease is present. Remote lacunar infarcts are evident within the basilar ganglia bilaterally.  Flow is present  in the major intracranial arteries. 3 or 4 areas of punctate susceptibility are noted, compatible with remote punctate hemorrhage. This raises the possibility of vasculitis.  No acute hemorrhage or mass lesion is present. The globes and orbits are intact. Mild mucosal thickening is present in the ethmoid air cells. The remaining paranasal sinuses and the mastoid air cells are clear. Degenerative grade 1 retrolisthesis is present at C2-3 and C3-4. Chronic endplate changes are present at C2-3.  MRA HEAD FINDINGS  The study is markedly degraded by patient motion. This sequence is particularly sensitive to motion. This significantly degrades the study. Asymmetric signal loss is present in the distal cavernous and supra clinoid left internal carotid artery. This may represent significant stenosis. The A1 and M1 segments appear to be intact. There is significant attenuation of branch vessels.  The left vertebral artery is slightly dominant to the right. There is some narrowing of the distal right vertebral artery. The basilar artery is intact. The right posterior cerebral artery appears to be a fetal type. There is likely a small right P1 segment. There is significant signal loss in the distal branch vessels.  IMPRESSION: MRI HEAD IMPRESSION  1. No acute intracranial abnormality. 2. Atrophy and diffuse white matter disease. This likely reflects the sequelae of chronic microvascular ischemia. 3. Punctate areas of remote hemorrhage are concerning for amyloid angiography or other chronic vasculitis. 4. Marked degenerative changes within  the upper cervical spine.  MRA HEAD IMPRESSION  1. The study is significantly degraded by patient motion, degrading evaluation of media min small vessels. 2. Probable narrowing of the distal cavernous and supra clinoid left internal carotid artery. 3. Fetal type right posterior cerebral artery.   Electronically Signed   By: Gennette Pac M.D.   On: 04/24/2013 08:35   Dg Chest Portable 1 View  04/21/2013   CLINICAL DATA:  Fever and weakness.  EXAM: PORTABLE CHEST - 1 VIEW  COMPARISON:  01/26/2013  FINDINGS: There is stable cardiomegaly. Mild atelectasis present at the right base. There is no evidence of pulmonary edema, consolidation, pneumothorax, nodule or pleural fluid. The visualized skeletal structures are unremarkable.  IMPRESSION: No active disease.   Electronically Signed   By: Irish Lack M.D.   On: 04/21/2013 12:49    Microbiology: Recent Results (from the past 240 hour(s))  URINE CULTURE     Status: None   Collection Time    04/21/13 12:58 PM      Result Value Range Status   Specimen Description URINE, CLEAN CATCH   Final   Special Requests NONE   Final   Culture  Setup Time     Final   Value: 04/21/2013 19:31     Performed at [facility] Count     Final   Value: NO GROWTH     Performed at [facility]   Culture     Final   Value: NO GROWTH     Performed at [facility]   Report Status 04/22/2013 FINAL   Final  CULTURE, BLOOD (ROUTINE X 2)     Status: None   Collection Time    04/21/13  1:25 PM      Result Value Range Status   Specimen Description BLOOD LEFT ARM   Final   Special Requests BOTTLES DRAWN AEROBIC AND ANAEROBIC 10CC   Final   Culture  Setup Time     Final   Value: 04/21/2013 17:00     Performed at [facility]   Culture     Final   Value:        BLOOD CULTURE RECEIVED NO GROWTH TO DATE CULTURE WILL BE HELD FOR 5 DAYS BEFORE ISSUING A FINAL NEGATIVE REPORT     Performed at [facility]   Report Status  PENDING   Incomplete  CULTURE, BLOOD (ROUTINE X 2)     Status: None   Collection Time    04/21/13  2:20 PM      Result Value Range Status   Specimen Description BLOOD RIGHT ARM   Final   Special Requests BOTTLES DRAWN AEROBIC AND ANAEROBIC 10CCAER,5CCANA   Final   Culture  Setup Time     Final   Value: 04/21/2013 17:00     Performed at [facility]   Culture     Final   Value:        BLOOD CULTURE RECEIVED NO GROWTH TO DATE CULTURE WILL BE HELD FOR 5 DAYS BEFORE ISSUING A FINAL NEGATIVE REPORT     Performed at [facility]   Report Status PENDING   Incomplete     Labs: Basic Metabolic Panel:  Recent Labs Lab 04/21/13 1240 04/22/13 0555 04/23/13 0530 04/25/13 0633  NA 137 139 136 135  K 4.1 3.6 4.2 3.4*  CL 105 107 104 105  CO2 20 17* 18* 17*  GLUCOSE 154* 119* 133* 205*  BUN 38* 34* 35* 37*  CREATININE 1.76* 1.59* 1.66* 1.67*  CALCIUM 9.3 9.0 8.9 9.5   Liver Function Tests: No results found for this basename: AST, ALT, ALKPHOS, BILITOT, PROT, ALBUMIN,  in the last 168 hours No results found for this basename: LIPASE, AMYLASE,  in the last 168 hours No results found for this basename: AMMONIA,  in the last 168 hours CBC:  Recent Labs Lab 04/21/13 1240 04/22/13 0555 04/25/13 0633  WBC 8.3 8.4 9.8  NEUTROABS 7.2  --   --   HGB 8.8* 8.2* 8.9*  HCT 27.1* 24.8* 26.8*  MCV 92.2 91.2 91.5  PLT 199 204 247   Cardiac Enzymes: No results found for this basename: CKTOTAL, CKMB, CKMBINDEX, TROPONINI,  in the last 168 hours BNP: BNP (last 3 results)  Recent Labs  01/26/13 1659 01/29/13 0530  PROBNP 3453.0* 3513.0*   CBG:  Recent Labs Lab 04/24/13 0554 04/24/13 1128 04/24/13 1707 04/24/13 2126 04/25/13 0553  GLUCAP 139* 228* 239* 144* 187*       Signed:  Marinda Elk  [facility] 04/25/2013, 12:47 PM

## 2013-04-25 NOTE — Progress Notes (Signed)
ANTICOAGULATION CONSULT NOTE - Follow Up Consult  Pharmacy Consult for Coumadin Indication: atrial fibrillation  Allergies  Allergen Reactions  . Clonidine Derivatives Itching and Rash    Skin breaks out    Patient Measurements: Height: 5\' 6"  (167.6 cm) Weight: 184 lb 1.4 oz (83.5 kg) IBW/kg (Calculated) : 59.3   Vital Signs: Temp: 97.7 F (36.5 C) (10/29 0506) Temp src: Oral (10/29 0506) BP: 130/76 mmHg (10/29 1020) Pulse Rate: 77 (10/29 1020)  Labs:  Recent Labs  04/23/13 0530 04/24/13 0535 04/25/13 0633  HGB  --   --  8.9*  HCT  --   --  26.8*  PLT  --   --  247  LABPROT 32.2* 37.7* 35.8*  INR 3.28* 4.03* 3.77*  CREATININE 1.66*  --  1.67*    Estimated Creatinine Clearance: 29.8 ml/min (by C-G formula based on Cr of 1.67).   Medications:  Scheduled:  . atorvastatin  20 mg Oral q1800  . citalopram  20 mg Oral Daily  . collagenase   Topical Daily  . diltiazem  120 mg Oral Daily  . doxycycline  100 mg Oral Q12H  . doxycycline (VIBRAMYCIN) IV  100 mg Intravenous Once  . feeding supplement (RESOURCE BREEZE)  1 Container Oral TID BM  . furosemide  20 mg Oral Daily  . hydrALAZINE  50 mg Oral TID  . insulin aspart  0-15 Units Subcutaneous TID WC  . insulin aspart  0-5 Units Subcutaneous QHS  . labetalol  300 mg Oral BID  . levothyroxine  25 mcg Oral QAC breakfast  . pantoprazole  40 mg Oral Daily  . sodium bicarbonate  650 mg Oral Daily  . sodium chloride  3 mL Intravenous Q12H  . Warfarin - Pharmacist Dosing Inpatient   Does not apply q1800    Assessment: 79yo female with AFib.  INR 3.77 remains supratherapeutic but did decrease today from 4.03.  Most recent coumadin dose was given on 10/26. Te antibiotic therapy may be increasing Coumadin effect. On doxycylcine PTA and continued as inpatient.  No bleeding noted. MD notes patient having progressive decline over the last several months, with a steep decline in the last 3 days PTA. Functional decline/failure  to thrive. Poor po intake recorded last 24 hrs. Resource feeding supplement TID   PTA coumadin dose: 7.5 mg MWF, 5 mg TTSS. Admit INR = 1.72.  Dosing may have been inconsistent pta (i.e. Missed doses).     Goal of Therapy:  INR 2-3 Monitor platelets by anticoagulation protocol: Yes   Plan:  1.  No Coumadin today 2.  F/U in AM  Noah Delaine, RPh Clinical Pharmacist Pager: [phone] 04/25/2013, 12:20 PM

## 2013-04-25 NOTE — Progress Notes (Signed)
Occupational Therapy Treatment Patient Details Name: [name] MRN: [id number] DOB: April 18, 1932 Today's Date: 04/25/2013 Time:  -     OT Assessment / Plan / Recommendation  History of present illness Pt admit with UTI and bil LE cellulitis.     OT comments  Pt currently Min A grooming in supported sitting in recliner. Pt also required total A +1 to reposition in chair in preparation for self feeding. Pt may benefit from weighted utensils due to noted tremor left when using fork today. Cont acute OT toward goals, d/c plan remains appropriate.   Follow Up Recommendations    SNF    Barriers to Discharge       Equipment Recommendations       Recommendations for Other Services  SNF  Frequency   2x/week  Progress towards OT Goals  Progressing  Plan   D/c to SNF    Precautions / Restrictions Precautions Precautions: Fall Required Braces or Orthoses: Other Brace/Splint Other Brace/Splint: B UNA boots Restrictions Weight Bearing Restrictions: No Other Position/Activity Restrictions: B LE ulcers   Pertinent Vitals/Pain Denies pain this session.    ADL  Eating/Feeding: Performed;Set up;Minimal assistance (Pt +1 assist to reposition in chair in prep for eating) Where Assessed - Eating/Feeding: Chair Grooming: Performed;Wash/dry hands;Minimal assistance;Set up Where Assessed - Grooming: Supported sitting ADL Comments: Grooming in supported sitting w/ Min A. Pt repositioned in chair w/ +1 total assist, in perparation for self feeding today. Pt required set up of tray for opening packages & containers and was then Mod I eating using LUE given increased time for tasks. Pt w/ noted tremors LUE using fork & may benefit from trial of weighted utensils in future. Independent holding cup and drinking  noted.    OT Diagnosis:    OT Problem List:   OT Treatment Interventions:     OT Goals(current goals can now be found in the care plan section)    Visit Information  Last OT Received On:  04/25/13 Assistance Needed: +2 History of Present Illness: Pt admit with UTI and bil LE cellulitis.      Subjective Data      Prior Functioning       Cognition  Cognition Arousal/Alertness: Lethargic Behavior During Therapy: Flat affect Overall Cognitive Status: Within Functional Limits for tasks assessed    Mobility  Bed Mobility Bed Mobility: Not assessed (Pt up in chair upon OT arrival ) Transfers Transfers: Not assessed        Balance Balance Balance Assessed: No   End of Session  Pt up in chair. Call bell and phone in reach.  GO     Mariam Dollar Beth Dixon 04/25/2013, 12:30 PM

## 2013-04-26 ENCOUNTER — Telehealth: Payer: Self-pay | Admitting: Neurology

## 2013-04-26 ENCOUNTER — Other Ambulatory Visit: Payer: Self-pay

## 2013-04-26 ENCOUNTER — Non-Acute Institutional Stay (SKILLED_NURSING_FACILITY): Payer: Medicare Other | Admitting: Internal Medicine

## 2013-04-26 DIAGNOSIS — E1129 Type 2 diabetes mellitus with other diabetic kidney complication: Secondary | ICD-10-CM

## 2013-04-26 DIAGNOSIS — N183 Chronic kidney disease, stage 3 unspecified: Secondary | ICD-10-CM

## 2013-04-26 DIAGNOSIS — I4891 Unspecified atrial fibrillation: Secondary | ICD-10-CM

## 2013-04-26 DIAGNOSIS — I509 Heart failure, unspecified: Secondary | ICD-10-CM

## 2013-04-26 MED ORDER — HYDROCODONE-ACETAMINOPHEN 7.5-325 MG PO TABS: 1.0000 | ORAL_TABLET | Freq: Four times a day (QID) | ORAL | Status: DC | PRN

## 2013-04-26 MED ORDER — ALPRAZOLAM 0.25 MG PO TABS: 0.2500 mg | ORAL_TABLET | Freq: Two times a day (BID) | ORAL | Status: DC | PRN

## 2013-04-26 NOTE — Addendum Note (Signed)
Addended by: Mickey Farber T on: 04/26/2013 02:30 PM   Modules accepted: Orders

## 2013-04-26 NOTE — Telephone Encounter (Signed)
She was admitted to hospital in 04/21/2013, 2 days following her office visit in 10/23, she was admitted for failure to thrive, she had MRI brain, which showed extensive diffuse atrophy, periventricular white matter disease.   But no ECHO, or US carotid was done, will hold off above testing, until she is reevaluated again in Jan 2015.

## 2013-04-27 ENCOUNTER — Non-Acute Institutional Stay (SKILLED_NURSING_FACILITY): Payer: Medicare Other | Admitting: Internal Medicine

## 2013-04-27 DIAGNOSIS — S81009A Unspecified open wound, unspecified knee, initial encounter: Secondary | ICD-10-CM

## 2013-04-27 DIAGNOSIS — I872 Venous insufficiency (chronic) (peripheral): Secondary | ICD-10-CM

## 2013-04-27 DIAGNOSIS — S81001A Unspecified open wound, right knee, initial encounter: Secondary | ICD-10-CM

## 2013-04-27 DIAGNOSIS — E1159 Type 2 diabetes mellitus with other circulatory complications: Secondary | ICD-10-CM

## 2013-04-27 LAB — CULTURE, BLOOD (ROUTINE X 2): Culture: NO GROWTH

## 2013-04-27 NOTE — Progress Notes (Signed)
Patient ID: [name], female   DOB: 06-12-1932, 79 y.o.   MRN: [id number] Facility; [facility] Chief complaint; review of severe venous stasis ulcers History; [name] is a 79 year old woman who was admitted to [facility] from October 25 through October 29 appear she has a multitude of medical issues he was admitted with generalized weakness progressive decline and a fever. She was placed on oral antibiotics secondary to chronic severe bilateral venous stasis ulcerations. There was some concern expressed by Dr. due to about her degree of arterial insufficiency. She also has chronic diastolic congestive heart failure, atrial fibrillation on Coumadin, type 2 diabetes in stage II chronic renal failure.  She also was been followed in the wound care clinic by Dr. Jimmey Ralph. She has been receiving debridement and Santyl base dressings. I spoke with them yesterday and they stated that they felt she could tolerate a Kerlix and Coban wrap. Wound care in the nursing home reports drainage and a lot of pain especially when dressing changes.  Physical examination Extremities; I could not feel peripheral pulses her dorsalis pedis posterior tibial or popliteal. The wounds are really quite expensive especially on the right where in the mid part of her right lower leg is almost circumferential wound. This is granulation tissue however it is covered with an adherent eschar that does not debride easily. It is no clear evidence of cellulitis here however the extent of the wound is really very impressive. On the lateral aspect of the left leg it is a fairly substantial wound as well however it is not circumferential and not covered with overtly visual eschar         [facility]*                *[facility]*                      [address]                     [place], [place] [address]                         [phone]    ------------------------------------------------------------ Noninvasive Vascular Lab  Lower Extremity Arterial Evaluation  Patient:    [name], [name] MR #:       [id number] Study Date: 01/30/2013 Gender:     F Age:        79 Height: Weight: BSA: Pt. Status: Room:    Linus Orn  SONOGRAPHER  Toma Deiters, RVS  ATTENDING    Richarda Overlie  ADMITTING    Lynden Oxford Reports also to:  ------------------------------------------------------------ History and indications:  Indications  729.5 Pain in limb.  History  Diagnostic evaluation.  Bilateral lower extremity pain. Venous status ulcers of both lower extremities. Venous status ulcers bilaterally. Chronic diastolic heart failure, Atrial fibrillation, Cellulitis, Chronic pain syndrome, Stage II chronic kidney disease, Edema, Type II diabetes   ------------------------------------------------------------ Study information:  Study status:  Routine.  Procedure:  A vascular evaluation was performed. Image quality was good.    ABI.     Segmental pressure measurements, ankle-brachial indices, and photoplethysmography.  Location:  Vascular laboratory. Patient status:  Inpatient.  Brachial pressures:  +--------+-----+----+---+         RightLeftMax +--------+-----+----+---+ Systolic153  151 153 +--------+-----+----+---+ Arterial pressure indices:  +-----------------+--------+--------------+--------+ Location  PressureBrachial indexWaveform +-----------------+--------+--------------+--------+ Right ant tibial Hg1.35          Biphasic +-----------------+--------+--------------+--------+ Right post tibial162mm Hg0.74          Biphasic +-----------------+--------+--------------+--------+ Left ant tibial  Hg--------------Biphasic +-----------------+--------+--------------+--------+ Left post tibial  Hg--------------Biphasic +-----------------+--------+--------------+--------+     Impression/plan #1 severe bilateral right greater than left venous stasis ulcer. These are overtly extremely painful-looking wounds as I watched the nurse change the dressing. I was frankly surprised by her arterial Dopplers from August 6 showing biphasic flow bilaterally. The ABI on the right the posterior tibial was 0.74 which is certainly reduced the left could not be calculated. I have no doubt she has some PAD however this was considerably better than what I expected. She certainly can tolerate a Kerlix Coban wrap  The surface slough is going to need to be debridement. I'm going to order some topical lidocaine and increase her oral pain medication and I'll try to do this next week 2. To loosen up the slough that is present I have ordered Santyl 1.1 with Medihoney. This hopefully will make this less adherent. The left leg is also going to needlelike surface debridement to remove biofilm layer over what appears to be reasonably healthy looking granulation tissue.

## 2013-04-28 NOTE — Clinical Social Work Placement (Addendum)
    Clinical Social Work Department CLINICAL SOCIAL WORK PLACEMENT NOTE 04/28/2013  Patient:  [name], [name]  Account Number:  [id number] Admit date:  04/21/2013  Clinical Social Worker:  Robin Searing  Date/time:  04/22/2013 03:42 PM  Clinical Social Work is seeking post-discharge placement for this patient at the following level of care:   SKILLED NURSING   (*CSW will update this form in Epic as items are completed)   04/22/2013  Patient/family provided with [facility] Department of Clinical Social Work's list of facilities offering this level of care within the geographic area requested by the patient (or if unable, by the patient's family).  04/22/2013  Patient/family informed of their freedom to choose among providers that offer the needed level of care, that participate in Medicare, Medicaid or managed care program needed by the patient, have an available bed and are willing to accept the patient.  04/22/2013  Patient/family informed of [facility]' ownership interest in [facility], as well as of the fact that they are under no obligation to receive care at this facility.  PASARR submitted to EDS on 04/22/2013 PASARR number received from EDS on 04/22/2013  FL2 transmitted to all facilities in geographic area requested by pt/family on  04/22/2013 FL2 transmitted to all facilities within larger geographic area on   Patient informed that his/her managed care company has contracts with or will negotiate with  certain facilities, including the following:   [facility]     Patient/family informed of bed offers received:  04/24/2013 Patient chooses bed at [facility] Physician recommends and patient chooses bed at    Patient to be transferred to [facility] on  04/25/2013 Patient to be transferred to facility by Ambulance  [facility])  The following physician request were entered in Epic:   Additional Comments: DC to [facility] 04/25/13.  Ok per  patient and family. Nursing notified and will call report. Discussed with Marylene Land- Admissions at [facility].  CSW signing off.  Lorri Frederick. Amaris Delafuente, LCSWA  [phone]

## 2013-05-01 ENCOUNTER — Ambulatory Visit (INDEPENDENT_AMBULATORY_CARE_PROVIDER_SITE_OTHER): Payer: Medicare Other | Admitting: General Practice

## 2013-05-01 ENCOUNTER — Non-Acute Institutional Stay (SKILLED_NURSING_FACILITY): Payer: Medicare Other | Admitting: Internal Medicine

## 2013-05-01 DIAGNOSIS — E1159 Type 2 diabetes mellitus with other circulatory complications: Secondary | ICD-10-CM

## 2013-05-01 DIAGNOSIS — S81001A Unspecified open wound, right knee, initial encounter: Secondary | ICD-10-CM

## 2013-05-01 DIAGNOSIS — I872 Venous insufficiency (chronic) (peripheral): Secondary | ICD-10-CM

## 2013-05-01 DIAGNOSIS — S81009A Unspecified open wound, unspecified knee, initial encounter: Secondary | ICD-10-CM

## 2013-05-03 ENCOUNTER — Non-Acute Institutional Stay (SKILLED_NURSING_FACILITY): Payer: Medicare Other | Admitting: Internal Medicine

## 2013-05-03 ENCOUNTER — Other Ambulatory Visit: Payer: Self-pay | Admitting: *Deleted

## 2013-05-03 DIAGNOSIS — N182 Chronic kidney disease, stage 2 (mild): Secondary | ICD-10-CM

## 2013-05-03 DIAGNOSIS — M25569 Pain in unspecified knee: Secondary | ICD-10-CM

## 2013-05-03 DIAGNOSIS — D631 Anemia in chronic kidney disease: Secondary | ICD-10-CM

## 2013-05-03 DIAGNOSIS — M25562 Pain in left knee: Secondary | ICD-10-CM

## 2013-05-03 MED ORDER — OXYCODONE HCL 10 MG PO TABS: ORAL_TABLET | ORAL | Status: DC

## 2013-05-08 NOTE — Progress Notes (Signed)
Patient ID: [name], female   DOB: May 13, 1932, 79 y.o.   MRN: [id number]           PROGRESS NOTE  DATE:  05/01/2013   FACILITY: [facility]    LEVEL OF CARE:   SNF   Acute Visit   CHIEF COMPLAINT:   Review of lower extremity, right greater than left, venous stasis ulcers.    HISTORY OF PRESENT ILLNESS:  This is an unfortunate woman who has really severe, right greater than left, lower extremity ulcers which are mostly severe chronic venous insufficiency.  She also has PAD.  However, these are not ischemic-looking wounds.  They are, however, very painful and I had increased her medications last week.    Today, we applied topical Lidocaine and gave her oxycodone 5 mg.  Both wound surfaces on her bilateral legs were debrided.  Even on the left leg, which superficially appears to be healthy, there is an adherent eschar with a "sandpaper" consistency to this.  I used a curette and hemostasis was achieved with direct pressure.  However, this is unfortunately going to be one of severe debridements that this woman is likely to need.     ASSESSMENT/PLAN:  Severe bilateral venous stasis ulcers.  The patient's wounds were debrided as described above.  This was nonsurgical with a tough, adherent eschar.  I am going to continue the Santyl and Medihoney which helps loosen the surface of these.  Unfortunately, there is no chance of healing this without further mechanical and enzymatic debridement.    CPT CODE: 40981

## 2013-05-10 ENCOUNTER — Inpatient Hospital Stay (HOSPITAL_COMMUNITY)
Admission: EM | Admit: 2013-05-10 | Discharge: 2013-05-14 | DRG: 555 | Disposition: A | Discharge status: Skilled Nursing Facility | Payer: Medicare Other | Attending: Internal Medicine | Admitting: Internal Medicine

## 2013-05-10 ENCOUNTER — Emergency Department (HOSPITAL_COMMUNITY): Payer: Medicare Other

## 2013-05-10 ENCOUNTER — Encounter (HOSPITAL_COMMUNITY): Payer: Self-pay | Admitting: Emergency Medicine

## 2013-05-10 ENCOUNTER — Non-Acute Institutional Stay (SKILLED_NURSING_FACILITY): Payer: Medicare Other | Admitting: Internal Medicine

## 2013-05-10 DIAGNOSIS — Z7901 Long term (current) use of anticoagulants: Secondary | ICD-10-CM

## 2013-05-10 DIAGNOSIS — F3289 Other specified depressive episodes: Secondary | ICD-10-CM | POA: Diagnosis present

## 2013-05-10 DIAGNOSIS — I509 Heart failure, unspecified: Secondary | ICD-10-CM | POA: Diagnosis present

## 2013-05-10 DIAGNOSIS — R627 Adult failure to thrive: Secondary | ICD-10-CM | POA: Diagnosis present

## 2013-05-10 DIAGNOSIS — M79609 Pain in unspecified limb: Secondary | ICD-10-CM | POA: Diagnosis present

## 2013-05-10 DIAGNOSIS — Z860100 Personal history of colon polyps, unspecified: Secondary | ICD-10-CM

## 2013-05-10 DIAGNOSIS — S3011XS Contusion of abdominal wall, sequela: Secondary | ICD-10-CM

## 2013-05-10 DIAGNOSIS — E785 Hyperlipidemia, unspecified: Secondary | ICD-10-CM | POA: Diagnosis present

## 2013-05-10 DIAGNOSIS — I739 Peripheral vascular disease, unspecified: Secondary | ICD-10-CM | POA: Diagnosis present

## 2013-05-10 DIAGNOSIS — L97909 Non-pressure chronic ulcer of unspecified part of unspecified lower leg with unspecified severity: Secondary | ICD-10-CM | POA: Diagnosis present

## 2013-05-10 DIAGNOSIS — E119 Type 2 diabetes mellitus without complications: Secondary | ICD-10-CM

## 2013-05-10 DIAGNOSIS — S301XXS Contusion of abdominal wall, sequela: Secondary | ICD-10-CM

## 2013-05-10 DIAGNOSIS — N182 Chronic kidney disease, stage 2 (mild): Secondary | ICD-10-CM

## 2013-05-10 DIAGNOSIS — M1990 Unspecified osteoarthritis, unspecified site: Secondary | ICD-10-CM | POA: Diagnosis present

## 2013-05-10 DIAGNOSIS — Z6829 Body mass index (BMI) 29.0-29.9, adult: Secondary | ICD-10-CM

## 2013-05-10 DIAGNOSIS — S301XXA Contusion of abdominal wall, initial encounter: Secondary | ICD-10-CM

## 2013-05-10 DIAGNOSIS — E43 Unspecified severe protein-calorie malnutrition: Secondary | ICD-10-CM

## 2013-05-10 DIAGNOSIS — T45515A Adverse effect of anticoagulants, initial encounter: Secondary | ICD-10-CM | POA: Diagnosis present

## 2013-05-10 DIAGNOSIS — R5383 Other fatigue: Secondary | ICD-10-CM

## 2013-05-10 DIAGNOSIS — R7989 Other specified abnormal findings of blood chemistry: Secondary | ICD-10-CM

## 2013-05-10 DIAGNOSIS — D631 Anemia in chronic kidney disease: Secondary | ICD-10-CM

## 2013-05-10 DIAGNOSIS — D649 Anemia, unspecified: Secondary | ICD-10-CM

## 2013-05-10 DIAGNOSIS — I10 Essential (primary) hypertension: Secondary | ICD-10-CM | POA: Diagnosis present

## 2013-05-10 DIAGNOSIS — D638 Anemia in other chronic diseases classified elsewhere: Secondary | ICD-10-CM

## 2013-05-10 DIAGNOSIS — I2789 Other specified pulmonary heart diseases: Secondary | ICD-10-CM | POA: Diagnosis present

## 2013-05-10 DIAGNOSIS — M7981 Nontraumatic hematoma of soft tissue: Principal | ICD-10-CM | POA: Diagnosis present

## 2013-05-10 DIAGNOSIS — R5381 Other malaise: Secondary | ICD-10-CM | POA: Diagnosis present

## 2013-05-10 DIAGNOSIS — F411 Generalized anxiety disorder: Secondary | ICD-10-CM

## 2013-05-10 DIAGNOSIS — R4182 Altered mental status, unspecified: Secondary | ICD-10-CM | POA: Diagnosis present

## 2013-05-10 DIAGNOSIS — E039 Hypothyroidism, unspecified: Secondary | ICD-10-CM | POA: Diagnosis present

## 2013-05-10 DIAGNOSIS — E669 Obesity, unspecified: Secondary | ICD-10-CM | POA: Diagnosis present

## 2013-05-10 DIAGNOSIS — I5033 Acute on chronic diastolic (congestive) heart failure: Secondary | ICD-10-CM

## 2013-05-10 DIAGNOSIS — R791 Abnormal coagulation profile: Secondary | ICD-10-CM | POA: Diagnosis present

## 2013-05-10 DIAGNOSIS — R609 Edema, unspecified: Secondary | ICD-10-CM

## 2013-05-10 DIAGNOSIS — F329 Major depressive disorder, single episode, unspecified: Secondary | ICD-10-CM | POA: Diagnosis present

## 2013-05-10 DIAGNOSIS — I700 Atherosclerosis of aorta: Secondary | ICD-10-CM | POA: Diagnosis present

## 2013-05-10 DIAGNOSIS — I4891 Unspecified atrial fibrillation: Secondary | ICD-10-CM

## 2013-05-10 DIAGNOSIS — Z87891 Personal history of nicotine dependence: Secondary | ICD-10-CM

## 2013-05-10 DIAGNOSIS — I872 Venous insufficiency (chronic) (peripheral): Secondary | ICD-10-CM | POA: Diagnosis present

## 2013-05-10 HISTORY — DX: Anemia in other chronic diseases classified elsewhere: D63.8

## 2013-05-10 LAB — PROTIME-INR
INR: 4.17 — ABNORMAL HIGH (ref 0.00–1.49)
Prothrombin Time: 38.7 seconds — ABNORMAL HIGH (ref 11.6–15.2)

## 2013-05-10 LAB — PRO B NATRIURETIC PEPTIDE: Pro B Natriuretic peptide (BNP): 3105 pg/mL — ABNORMAL HIGH (ref 0–450)

## 2013-05-10 LAB — BASIC METABOLIC PANEL
BUN: 50 mg/dL — ABNORMAL HIGH (ref 6–23)
Calcium: 9.1 mg/dL (ref 8.4–10.5)
Chloride: 105 mEq/L (ref 96–112)
Creatinine, Ser: 1.56 mg/dL — ABNORMAL HIGH (ref 0.50–1.10)
GFR calc Af Amer: 35 mL/min — ABNORMAL LOW (ref >90)
GFR calc non Af Amer: 30 mL/min — ABNORMAL LOW (ref >90)
Glucose, Bld: 169 mg/dL — ABNORMAL HIGH (ref 70–99)
Potassium: 3.9 mEq/L (ref 3.5–5.1)

## 2013-05-10 LAB — ABO/RH: ABO/RH(D): A POS

## 2013-05-10 LAB — MRSA PCR SCREENING: MRSA by PCR: NEGATIVE

## 2013-05-10 LAB — OCCULT BLOOD, POC DEVICE: Fecal Occult Bld: NEGATIVE

## 2013-05-10 LAB — CBC
HCT: 19.4 % — ABNORMAL LOW (ref 36.0–46.0)
Hemoglobin: 6 g/dL — CL (ref 12.0–15.0)
MCH: 30.8 pg (ref 26.0–34.0)
MCHC: 30.9 g/dL (ref 30.0–36.0)
RDW: 19.7 % — ABNORMAL HIGH (ref 11.5–15.5)
WBC: 7.8 10*3/uL (ref 4.0–10.5)

## 2013-05-10 LAB — GLUCOSE, CAPILLARY: Glucose-Capillary: 120 mg/dL — ABNORMAL HIGH (ref 70–99)

## 2013-05-10 MED ORDER — HYDROCODONE-ACETAMINOPHEN 7.5-325 MG PO TABS
1.0000 | ORAL_TABLET | Freq: Four times a day (QID) | ORAL | Status: DC | PRN
  Administered 2013-05-12 – 2013-05-14 (×5): 1 via ORAL
  Filled 2013-05-10 (×5): qty 1

## 2013-05-10 MED ORDER — FERROUS GLUCONATE 324 (38 FE) MG PO TABS
324.0000 mg | ORAL_TABLET | Freq: Every day | ORAL | Status: DC
  Administered 2013-05-11 – 2013-05-14 (×4): 324 mg via ORAL
  Filled 2013-05-10 (×5): qty 1

## 2013-05-10 MED ORDER — LEVOTHYROXINE SODIUM 25 MCG PO TABS
25.0000 ug | ORAL_TABLET | Freq: Every day | ORAL | Status: DC
  Administered 2013-05-11 – 2013-05-14 (×4): 25 ug via ORAL
  Filled 2013-05-10 (×5): qty 1

## 2013-05-10 MED ORDER — MULTI-VITAMIN/MINERALS PO TABS: 1.0000 | ORAL_TABLET | Freq: Every day | ORAL | Status: DC

## 2013-05-10 MED ORDER — SODIUM CHLORIDE 0.9 % IJ SOLN
3.0000 mL | Freq: Two times a day (BID) | INTRAMUSCULAR | Status: DC
  Administered 2013-05-11 – 2013-05-14 (×7): 3 mL via INTRAVENOUS

## 2013-05-10 MED ORDER — INSULIN ASPART 100 UNIT/ML ~~LOC~~ SOLN
0.0000 [IU] | Freq: Three times a day (TID) | SUBCUTANEOUS | Status: DC
  Administered 2013-05-11: 13:00:00 1 [IU] via SUBCUTANEOUS
  Administered 2013-05-12: 2 [IU] via SUBCUTANEOUS
  Administered 2013-05-12 (×2): 1 [IU] via SUBCUTANEOUS
  Administered 2013-05-13: 2 [IU] via SUBCUTANEOUS
  Administered 2013-05-14: 1 [IU] via SUBCUTANEOUS

## 2013-05-10 MED ORDER — ZINC SULFATE 220 (50 ZN) MG PO CAPS
220.0000 mg | ORAL_CAPSULE | Freq: Every day | ORAL | Status: DC
  Administered 2013-05-11 – 2013-05-14 (×4): 220 mg via ORAL
  Filled 2013-05-10 (×4): qty 1

## 2013-05-10 MED ORDER — ONDANSETRON HCL 4 MG/2ML IJ SOLN: 4.0000 mg | Freq: Four times a day (QID) | INTRAMUSCULAR | Status: DC | PRN

## 2013-05-10 MED ORDER — ACETAMINOPHEN 650 MG RE SUPP: 650.0000 mg | Freq: Four times a day (QID) | RECTAL | Status: DC | PRN

## 2013-05-10 MED ORDER — ACETAMINOPHEN 325 MG PO TABS: 650.0000 mg | ORAL_TABLET | Freq: Four times a day (QID) | ORAL | Status: DC | PRN

## 2013-05-10 MED ORDER — ADULT MULTIVITAMIN W/MINERALS CH
1.0000 | ORAL_TABLET | Freq: Every day | ORAL | Status: DC
  Administered 2013-05-11 – 2013-05-14 (×4): 1 via ORAL
  Filled 2013-05-10 (×4): qty 1

## 2013-05-10 MED ORDER — OXYCODONE HCL 5 MG PO TABS
10.0000 mg | ORAL_TABLET | Freq: Every day | ORAL | Status: DC | PRN
  Administered 2013-05-11: 10 mg via ORAL
  Filled 2013-05-10: qty 2

## 2013-05-10 MED ORDER — OXYCODONE HCL 10 MG PO TABS: 10.0000 mg | ORAL_TABLET | Freq: Every day | ORAL | Status: DC | PRN

## 2013-05-10 MED ORDER — VITAMIN D 1000 UNITS PO CAPS: 1000.0000 [IU] | ORAL_CAPSULE | Freq: Every day | ORAL | Status: DC

## 2013-05-10 MED ORDER — FUROSEMIDE 10 MG/ML IJ SOLN
40.0000 mg | Freq: Once | INTRAMUSCULAR | Status: AC
  Administered 2013-05-10: 22:00:00 40 mg via INTRAVENOUS
  Filled 2013-05-10: qty 4

## 2013-05-10 MED ORDER — SODIUM BICARBONATE 650 MG PO TABS
650.0000 mg | ORAL_TABLET | Freq: Three times a day (TID) | ORAL | Status: DC
  Administered 2013-05-10 – 2013-05-14 (×12): 650 mg via ORAL
  Filled 2013-05-10 (×13): qty 1

## 2013-05-10 MED ORDER — FERROUS GLUCONATE 324 (38 FE) MG PO TABS: 325.0000 mg | ORAL_TABLET | Freq: Every day | ORAL | Status: DC

## 2013-05-10 MED ORDER — ATORVASTATIN CALCIUM 20 MG PO TABS
20.0000 mg | ORAL_TABLET | Freq: Every day | ORAL | Status: DC
  Administered 2013-05-11 – 2013-05-13 (×3): 20 mg via ORAL
  Filled 2013-05-10 (×4): qty 1

## 2013-05-10 MED ORDER — ONDANSETRON HCL 4 MG PO TABS: 4.0000 mg | ORAL_TABLET | Freq: Four times a day (QID) | ORAL | Status: DC | PRN

## 2013-05-10 MED ORDER — DOCUSATE SODIUM 100 MG PO CAPS
100.0000 mg | ORAL_CAPSULE | Freq: Two times a day (BID) | ORAL | Status: DC | PRN
  Administered 2013-05-13: 21:00:00 100 mg via ORAL
  Filled 2013-05-10 (×2): qty 1

## 2013-05-10 MED ORDER — VITAMIN D3 25 MCG (1000 UNIT) PO TABS
1000.0000 [IU] | ORAL_TABLET | Freq: Every day | ORAL | Status: DC
  Administered 2013-05-11 – 2013-05-14 (×4): 1000 [IU] via ORAL
  Filled 2013-05-10 (×4): qty 1

## 2013-05-10 MED ORDER — FUROSEMIDE 20 MG PO TABS
20.0000 mg | ORAL_TABLET | Freq: Every day | ORAL | Status: DC
  Administered 2013-05-11 – 2013-05-14 (×4): 20 mg via ORAL
  Filled 2013-05-10 (×4): qty 1

## 2013-05-10 MED ORDER — DILTIAZEM HCL ER COATED BEADS 120 MG PO CP24
120.0000 mg | ORAL_CAPSULE | Freq: Every day | ORAL | Status: DC
  Administered 2013-05-11 – 2013-05-14 (×4): 120 mg via ORAL
  Filled 2013-05-10 (×4): qty 1

## 2013-05-10 MED ORDER — LABETALOL HCL 300 MG PO TABS
300.0000 mg | ORAL_TABLET | Freq: Two times a day (BID) | ORAL | Status: DC
  Administered 2013-05-10 – 2013-05-14 (×8): 300 mg via ORAL
  Filled 2013-05-10 (×9): qty 1

## 2013-05-10 NOTE — ED Notes (Signed)
Bed: WA02 Expected date:  Expected time:  Means of arrival:  Comments: EMS-HgB 6

## 2013-05-10 NOTE — Progress Notes (Signed)
Called ED for report, spoke with Diplomatic Services operational officer.  Secretary spoke with ED nurse, relayed that patient's ED nurse would call me back with report.

## 2013-05-10 NOTE — ED Notes (Signed)
Per ptar: pt from [facility], had lab work done 11/10, resulted on 11/11. Hemoglobin 6.1 - no active bleeding but is on coumadin. Hx afib.  bp 110/74, pulse 75 irregular, respirations 18, saO2 100% on 4L [place]

## 2013-05-10 NOTE — ED Provider Notes (Signed)
CSN: [id number]     Arrival date & time 05/10/13  1417 History   First MD Initiated Contact with Patient 05/10/13 1458     Chief Complaint  Patient presents with  . hemoglobing 6.1     HPI  Patient presents nursing facility due to staff concerns of decreased interactivity, and abnormal lab result. The patient herself states that she frequently has pain, though no focal pain currently.  She denies nausea, she is asleep but oriented when awake, though she remains somnolent throughout the ED course.  She answers questions verbally / appropriately, but quickly falls asleep again.  She has no documented history of dementia. Per reports the patient had routine blood draw 2 days ago, results were abnormal with anemia.  She is here for further evaluation and management.  Per report there is no new evidence of bleeding anywhere. The patient herself denies bleeding anywhere as well. The patient's ability to relate HPI / ROS is limited 2/2 acuity of condition.   Past Medical History  Diagnosis Date  . Obesity   . Anemia, iron deficiency   . Depression   . Diabetes mellitus type II   . Hypertension   . Peptic ulcer   . History of uterine fibroid   . Hx of colonic polyps   . Diverticula, colon   . Osteoarthritis   . Low back pain   . Atrial fibrillation     permanent, on coumadin (followed by [facility] coumadin clinic)  . Pulmonary hypertension     echo 4/12:  EF 55-60%, mod LVH, mild BAE, RVE and PASP 51  . Anxiety   . DIABETES MELLITUS, TYPE II 03/05/2007  . HYPERLIPIDEMIA 03/05/2007  . ANEMIA-IRON DEFICIENCY 03/05/2007  . HYPERTENSION 03/05/2007  . DEPRESSION 03/05/2007  . LOW BACK PAIN 03/05/2007  . PERIPHERAL EDEMA 04/29/2008  . Chronic pain syndrome 06/23/2010  . FIBROIDS, UTERUS 03/05/2007  . PEPTIC ULCER DISEASE 03/05/2007  . DIVERTICULOSIS, COLON 03/05/2007  . OSTEOARTHRITIS 03/05/2007  . COLONIC POLYPS, HX OF 03/05/2007  . Hypothyroidism 10/15/2010  . Renal insufficiency 10/15/2010  . Atrial  fibrillation    Past Surgical History  Procedure Laterality Date  . Laparoscopic hysterectomy     Family History  Problem Relation Age of Onset  . Hypertension Sister    History  Substance Use Topics  . Smoking status: Former Smoker    Start date: 03/23/1972  . Smokeless tobacco: Never Used  . Alcohol Use: No   OB History   Grav Para Term Preterm Abortions TAB SAB Ect Mult Living                 Review of Systems  Unable to perform ROS: Acuity of condition    Allergies  Clonidine derivatives  Home Medications   Current Outpatient Rx  Name  Route  Sig  Dispense  Refill  . ALPRAZolam (XANAX) 0.25 MG tablet   Oral   Take 1 tablet (0.25 mg total) by mouth 2 (two) times daily as needed. For anxiety   60 tablet   5   . atorvastatin (LIPITOR) 20 MG tablet   Oral   Take 1 tablet (20 mg total) by mouth daily at 6 PM.   30 tablet   0   . Cholecalciferol (VITAMIN D) 1000 UNITS capsule   Oral   Take 1,000 Units by mouth daily.          Marland Kitchen diltiazem (CARDIZEM CD) 120 MG 24 hr capsule   Oral   Take  120 mg by mouth daily.         Marland Kitchen docusate sodium (COLACE) 100 MG capsule   Oral   Take 100 mg by mouth 2 (two) times daily as needed. For constipation          . ferrous gluconate (FERGON) 325 MG tablet   Oral   Take 325 mg by mouth daily with breakfast.         . furosemide (LASIX) 40 MG tablet   Oral   Take 0.5 tablets (20 mg total) by mouth daily.   90 tablet   3   . hydrALAZINE (APRESOLINE) 50 MG tablet   Oral   Take 50 mg by mouth 3 (three) times daily.         Marland Kitchen HYDROcodone-acetaminophen (NORCO) 7.5-325 MG per tablet   Oral   Take 1 tablet by mouth every 6 (six) hours as needed for pain. DO NOT EXCEED 4 GM OF TYLENOL IN 24 HOURS   120 tablet   0   . labetalol (NORMODYNE) 300 MG tablet   Oral   Take 300 mg by mouth 2 (two) times daily.         Marland Kitchen levothyroxine (SYNTHROID, LEVOTHROID) 25 MCG tablet   Oral   Take 25 mcg by mouth daily before  breakfast.         . Multiple Vitamins-Minerals (MULTIVITAMIN WITH MINERALS) tablet   Oral   Take 1 tablet by mouth daily.         . ondansetron (ZOFRAN) 4 MG tablet   Oral   Take 1 tablet (4 mg total) by mouth every 8 (eight) hours as needed for nausea.   30 tablet   1   . Oxycodone HCl 10 MG TABS      Take one tablet by mouth one hour prior to wound treatment   30 tablet   0   . sodium bicarbonate 650 MG tablet   Oral   Take 1 tablet (650 mg total) by mouth 3 (three) times daily.         Marland Kitchen warfarin (COUMADIN) 5 MG tablet   Oral   Take 5-7.5 mg by mouth daily. Take 7.5mg  on Monday, Wednesday, Friday. Take 5mg  the rest of the week.         . zinc sulfate 220 MG capsule   Oral   Take 220 mg by mouth daily.          BP 126/61  Pulse 67  Temp(Src) 99.1 F (37.3 C) (Rectal)  Resp 17  SpO2 100% Physical Exam  Nursing note and vitals reviewed. Constitutional: She is oriented to person, place, and time. She has a sickly appearance.  HENT:  Head: Normocephalic and atraumatic.  Eyes: Conjunctivae and EOM are normal.  Cardiovascular: Normal rate and regular rhythm.   Pulmonary/Chest: Effort normal and breath sounds normal. No stridor. No respiratory distress.  Abdominal: She exhibits no distension.  Genitourinary:  Rectal tone present w caked on cream throughout the perineum.  No gross bleeding.  Musculoskeletal: She exhibits no edema.  Neurological: She is alert and oriented to person, place, and time. No cranial nerve deficit.  Skin:  Extensive wrapping about both LE - patient notes that this is new. - no spreading erythema about the superior or inferior aspects.   Psychiatric: Her speech is delayed. She is slowed and withdrawn. Cognition and memory are impaired.    ED Course  Procedures (including critical care time) Labs Review Labs Reviewed  BASIC  METABOLIC PANEL  CBC  PROTIME-INR  PRO B NATRIURETIC PEPTIDE  CG4 I-STAT (LACTIC ACID)  TYPE AND  SCREEN   Imaging Review No results found.  EKG Interpretation     Ventricular Rate:  68 PR Interval:    QRS Duration: 141 QT Interval:  602 QTC Calculation: 640 R Axis:   6 Text Interpretation:  Atrial fibrillation Right bundle branch block Artifact Abnormal ekg           Review of the patient's demonstrates that she was discharged 2 weeks ago from our affiliated facility after an admission for failure to thrive.  Cardiac: 70 irregular- abnormal  O2- 97%ra, nml   Update: Patient appears comfortable.  The patient's daughter-in-law is not present.  Discuss results thus far. Patient's daughter-in-law suggested the patient is significantly more weak than at baseline.   Update: Labs notable for anemia.  On repeat exam the patient is comfortable-appearing.  With her anemia, confusion will be initiated. MDM  No diagnosis found. This elderly female presents nursing facility with concern for anemia.  On exam she is somnolent, but awakens and is oriented when awake.  Patient does move all extremities spontaneously.  With her history of Coumadin use, there is some concern for spontaneous bleed, though vital signs are stable.  Patient's labs are most notable for supratherapeutic INR, and anemia.  With the new anemia, the patient required transfusion, admission for further evaluation and management.  CRITICAL CARE Performed by: Gerhard Munch Total critical care time: 35 Critical care time was exclusive of separately billable procedures and treating other patients. Critical care was necessary to treat or prevent imminent or life-threatening deterioration. Critical care was time spent personally by me on the following activities: development of treatment plan with patient and/or surrogate as well as nursing, discussions with consultants, evaluation of patient's response to treatment, examination of patient, obtaining history from patient or surrogate, ordering and performing  treatments and interventions, ordering and review of laboratory studies, ordering and review of radiographic studies, pulse oximetry and re-evaluation of patient's condition.     Gerhard Munch, MD 05/10/13 1725

## 2013-05-10 NOTE — H&P (Signed)
[facility] History and Physical  [name]:[id number] DOB: 1932-06-18 DOA: 05/10/2013   PCP: Oliver Barre, MD   Chief Complaint: Fatigue, lethargy, anemia  HPI:  79 y.o. female with multiple comorbidities including chronic bilateral lower extremity ulcers, peripheral vascular disease, venous stasis insufficiency, type 2 diabetes, who was admitted to her service back on 01/26/2013 for her lower extremity cellulitis and 04/21/2013 for failure to thrive. The patient resides at [facility]. For 1-2 days, the patient has not been herself. She has had more fatigue and lethargy. As a result, CBC was obtained and it was 6.1. As a result, she was transferred to the ED for further evaluation. The patient denies any fevers, chills, chest discomfort, shortness breath, nausea, vomiting, diarrhea, abdominal pain, dysuria. She denies any epistaxis, hematemesis, hemoptysis, hematochezia, melena, hematuria. The patient thinks that she may have had a colonoscopy approximately 10 years ago which was negative. The patient had a hemoglobin of 8.9 on 04/25/2013. Her baseline hemoglobin is 9-10.  In the ED, CT of the brain was negative for acute changes. The patient was hemodynamically stable with a blood pressure of 126/61 and oxygen saturation of 100% on room air. Chest x-ray revealed increased vascular congestion with new bilateral small effusions. Fecal occult blood test was negative. Hemoglobin was noted to be 6.0. BMP was unremarkable except for creatinine of 1.56.   Assessment/Plan: Anemia -Concerning for GI bleed -Hemoglobin was 8.9 on 04/25/2013 -I have consulted [facility] who will see pt in am -ED has ordered 2 units PRBC -Given the fairly rapid hemoglobin drop--consult GI -Iron, TIBC, B12, RBC folate Lethargy and fatigue -Likely do to her anemia -UA with reflex 2 urine culture -TSH 2.110 on 05/07/2013 Acute on Chronic diastolic CHF -Patient's proBNP of the baseline when compared to one year  ago -Likely due to the patient's anemia -There is no respiratory distress -Diuresed judiciously -Furosemide 40 mg IV with PRBCs -Reevaluate need for continued IV diuretics after transfusion -Patient normally takes Lasix 20 mg po daily -Echocardiogram Chronic lower extremity venous stasis with ulcerations -Patient currently has  UNNA boots -Consult wound care nurse Diabetes mellitus type 2 -Hemoglobin A1c 6.8 on 01/26/2013 -Novolin sliding scale Chronic atrial fibrillation -Rate controlled -Hold Coumadin as INR is supratherapeutic -Continue Cardizem and labetalol Hypertension -Hold hydralazine for now and monitor due to soft bp -Continue Cardizem and labetalol as discussed      Past Medical History  Diagnosis Date  . Obesity   . Anemia, iron deficiency   . Depression   . Diabetes mellitus type II   . Hypertension   . Peptic ulcer   . History of uterine fibroid   . Hx of colonic polyps   . Diverticula, colon   . Osteoarthritis   . Low back pain   . Atrial fibrillation     permanent, on coumadin (followed by [facility] coumadin clinic)  . Pulmonary hypertension     echo 4/12:  EF 55-60%, mod LVH, mild BAE, RVE and PASP 51  . Anxiety   . DIABETES MELLITUS, TYPE II 03/05/2007  . HYPERLIPIDEMIA 03/05/2007  . ANEMIA-IRON DEFICIENCY 03/05/2007  . HYPERTENSION 03/05/2007  . DEPRESSION 03/05/2007  . LOW BACK PAIN 03/05/2007  . PERIPHERAL EDEMA 04/29/2008  . Chronic pain syndrome 06/23/2010  . FIBROIDS, UTERUS 03/05/2007  . PEPTIC ULCER DISEASE 03/05/2007  . DIVERTICULOSIS, COLON 03/05/2007  . OSTEOARTHRITIS 03/05/2007  . COLONIC POLYPS, HX OF 03/05/2007  . Hypothyroidism 10/15/2010  . Renal insufficiency 10/15/2010  . Atrial fibrillation  Past Surgical History  Procedure Laterality Date  . Laparoscopic hysterectomy     Social History:  reports that she has quit smoking. She started smoking about 41 years ago. She has never used smokeless tobacco. She reports that she does not drink alcohol or  use illicit drugs.   Family History  Problem Relation Age of Onset  . Hypertension Sister      Allergies  Allergen Reactions  . Clonidine Derivatives Itching and Rash    Skin breaks out      Prior to Admission medications   Medication Sig Start Date End Date Taking? Authorizing Provider  ALPRAZolam (XANAX) 0.25 MG tablet Take 1 tablet (0.25 mg total) by mouth 2 (two) times daily as needed. For anxiety 04/26/13  Yes Tiffany L Reed, DO  atorvastatin (LIPITOR) 20 MG tablet Take 1 tablet (20 mg total) by mouth daily at 6 PM. 04/25/13  Yes Marinda Elk, MD  Cholecalciferol (VITAMIN D) 1000 UNITS capsule Take 1,000 Units by mouth daily.    Yes Historical Provider, MD  diltiazem (CARDIZEM CD) 120 MG 24 hr capsule Take 120 mg by mouth daily.   Yes Historical Provider, MD  docusate sodium (COLACE) 100 MG capsule Take 100 mg by mouth 2 (two) times daily as needed. For constipation    Yes Historical Provider, MD  ferrous gluconate (FERGON) 325 MG tablet Take 325 mg by mouth daily with breakfast.   Yes Historical Provider, MD  furosemide (LASIX) 40 MG tablet Take 0.5 tablets (20 mg total) by mouth daily. 04/25/13  Yes Marinda Elk, MD  hydrALAZINE (APRESOLINE) 50 MG tablet Take 50 mg by mouth 3 (three) times daily.   Yes Historical Provider, MD  HYDROcodone-acetaminophen (NORCO) 7.5-325 MG per tablet Take 1 tablet by mouth every 6 (six) hours as needed for pain. DO NOT EXCEED 4 GM OF TYLENOL IN 24 HOURS 04/26/13  Yes Tiffany L Reed, DO  labetalol (NORMODYNE) 300 MG tablet Take 300 mg by mouth 2 (two) times daily.   Yes Historical Provider, MD  levothyroxine (SYNTHROID, LEVOTHROID) 25 MCG tablet Take 25 mcg by mouth daily before breakfast.   Yes Historical Provider, MD  Multiple Vitamins-Minerals (MULTIVITAMIN WITH MINERALS) tablet Take 1 tablet by mouth daily.   Yes Historical Provider, MD  ondansetron (ZOFRAN) 4 MG tablet Take 1 tablet (4 mg total) by mouth every 8 (eight) hours as  needed for nausea. 02/13/13  Yes Corwin Levins, MD  Oxycodone HCl 10 MG TABS Take one tablet by mouth one hour prior to wound treatment 05/03/13  Yes Tiffany L Reed, DO  sodium bicarbonate 650 MG tablet Take 1 tablet (650 mg total) by mouth 3 (three) times daily. 04/25/13  Yes Marinda Elk, MD  warfarin (COUMADIN) 5 MG tablet Take 5-7.5 mg by mouth daily. Take 7.5mg  on Monday, Wednesday, Friday. Take 5mg  the rest of the week.   Yes Historical Provider, MD  zinc sulfate 220 MG capsule Take 220 mg by mouth daily.   Yes Historical Provider, MD    Review of Systems:  Constitutional:  No weight loss, night sweats, Fevers, chills Head&Eyes: No headache.  No vision loss.   ENT:  No Difficulty swallowing,Tooth/dental problems,Sore throat,   Cardio-vascular:  No chest pain, Orthopnea, PND, swelling in lower extremities,  dizziness,  GI:  No  abdominal pain, nausea, vomiting, diarrhea,hematochezia, melena, heartburn, indigestion, Resp:  No shortness of breath with exertion or at rest. No cough. No coughing up of blood .No wheezing. Skin:  no rash or lesions.  GU:  no dysuria, change in color of urine, no urgency or frequency. No flank pain.  Musculoskeletal:  No joint pain or swelling. No decreased range of motion. No back pain.  Psych:  No change in mood or affect. No depression or anxiety. Neurologic: No headache, no dysesthesia, no focal weakness, no vision loss. No syncope  Physical Exam: Filed Vitals:   05/10/13 1423 05/10/13 1427 05/10/13 1517  BP: 126/61    Pulse: 67    Temp:   99.1 F (37.3 C)  TempSrc: Rectal  Rectal  Resp: 17    SpO2: 100% 100%    General:  A&O x 2, NAD, nontoxic, pleasant/cooperative Head/Eye: No conjunctival hemorrhage, no icterus, [place]/AT, No nystagmus ENT:  No icterus,  No thrush, good dentition, no pharyngeal exudate Neck:  No masses, no lymphadenpathy CV:  IRRR, no rub Lung:  Fine bibasilar crackles without any wheezing. Good air  movement. Abdomen: soft/NT, +BS, nondistended, no peritoneal signs Ext: Bilateral lower extremities in UNNA boots, 1+LE edema   Labs on Admission:  Basic Metabolic Panel:  Recent Labs Lab 05/10/13 1450  NA 140  K 3.9  CL 105  CO2 26  GLUCOSE 169*  BUN 50*  CREATININE 1.56*  CALCIUM 9.1   Liver Function Tests: No results found for this basename: AST, ALT, ALKPHOS, BILITOT, PROT, ALBUMIN,  in the last 168 hours No results found for this basename: LIPASE, AMYLASE,  in the last 168 hours No results found for this basename: AMMONIA,  in the last 168 hours CBC:  Recent Labs Lab 05/10/13 1450  WBC 7.8  HGB 6.0*  HCT 19.4*  MCV 99.5  PLT 324   Cardiac Enzymes: No results found for this basename: CKTOTAL, CKMB, CKMBINDEX, TROPONINI,  in the last 168 hours BNP: No components found with this basename: POCBNP,  CBG: No results found for this basename: GLUCAP,  in the last 168 hours  Radiological Exams on Admission: Dg Chest 2 View  05/10/2013   CLINICAL DATA:  79 year old female altered mental status and weakness. Initial encounter.  EXAM: CHEST  2 VIEW  COMPARISON:  04/21/2013 and earlier.  FINDINGS: Semi upright AP and lateral views of the chest. New small bilateral pleural effusions. Increased bibasilar opacity most resembling atelectasis. Stable cardiomegaly and mediastinal contours. Pulmonary vascular congestion without overt edema. Stable visualized osseous structures.  IMPRESSION: Increased vascular congestion without overt edema. New small bilateral pleural effusions and bibasilar atelectasis. Stable cardiomegaly.   Electronically Signed   By: Augusto Gamble M.D.   On: 05/10/2013 16:03   Ct Head Wo Contrast  05/10/2013   CLINICAL DATA:  79 year old female with weakness. Altered mental status. Initial encounter.  EXAM: CT HEAD WITHOUT CONTRAST  TECHNIQUE: Contiguous axial images were obtained from the base of the skull through the vertex without intravenous contrast.   COMPARISON:  Brain MRI 04/23/2013 and earlier.  FINDINGS: Visualized paranasal sinuses and mastoids are clear. Calcified atherosclerosis at the skull base. Stable orbit and scalp soft tissues. Stable visualized osseous structures.  Stable cerebral volume. No ventriculomegaly. Patchy in confluent cerebral white matter hypodensity is stable. Chronic lacunar infarcts in the bilateral deep gray matter nuclei (left lentiform and right thalamus) appear stable. No midline shift, mass effect, or evidence of intracranial mass lesion. No suspicious intracranial vascular hyperdensity. No acute intracranial hemorrhage identified. No evidence of cortically based acute infarction identified.  IMPRESSION: Stable chronic small vessel ischemia. No acute intracranial abnormality.   Electronically Signed  By: Augusto Gamble M.D.   On: 05/10/2013 16:27       Time spent:60 minutes Code Status:   FULL Family Communication:   daughter at bedside   Shoua Ulloa, DO  [facility] Pager [phone]  If 7PM-7AM, please contact night-coverage [email or web address] Password [facility] 05/10/2013, 6:11 PM

## 2013-05-10 NOTE — Progress Notes (Addendum)
Pts daughter in-law asked to speak with CSW.  Pts daughter in law reports that she has to leave but would like to be notified if pt is admitted.  Dorien Chihuahua [phone].  CSW consulted with Dr. Jeraldine Loots who reported that it was ok for daughter in law to leave pt.  CSW let daughter know that CSW would note that she is requesting a call and that if pt is moved while CSW is on shift that she would contact daughter in law of pts room status.  Marva Panda, Theresia Majors  [phone]  .05/10/2013   CSW contacted daughter in law to let her know that pt had been moved to room 1440 on the 4th Floor.  Marva Panda, Theresia Majors  [phone]  .05/10/2013  8:55 pm

## 2013-05-10 NOTE — Progress Notes (Signed)
Spoke with mid level floor coverage earlier this shift and notified that pt unable to tolerate SCDs due to lower extremity cellulitis and compression wraps.  She will leave a note for attending MD.  No bloodthinners ordered at this time pending GI consult.  Ardyth Gal, RN 05/10/2013

## 2013-05-10 NOTE — Progress Notes (Signed)
CSW met with pt and family at bedside.  Pt was sleeping.  Pts daugther in law, Chaka Boyson, was at her bedside.  Daughter in law confirms that pt is a resident at [facility] and that the family plans for her to return once she is medically stable.  Daughter in law reports that she thinks pts daughter, Daisey Must [phone]) is pts HCPOA.  CSW told daughter in law that CSW would try to come by again to see if pt was more alert later.  Daughter in law thanked CSW for concern.  Marva Panda, LCSWA  [phone]  .05/10/2013 5:05 pm

## 2013-05-11 ENCOUNTER — Encounter (HOSPITAL_COMMUNITY): Payer: Self-pay | Admitting: Internal Medicine

## 2013-05-11 ENCOUNTER — Inpatient Hospital Stay (HOSPITAL_COMMUNITY): Payer: Medicare Other

## 2013-05-11 ENCOUNTER — Encounter (HOSPITAL_BASED_OUTPATIENT_CLINIC_OR_DEPARTMENT_OTHER): Payer: Medicare Other

## 2013-05-11 DIAGNOSIS — I369 Nonrheumatic tricuspid valve disorder, unspecified: Secondary | ICD-10-CM

## 2013-05-11 DIAGNOSIS — E43 Unspecified severe protein-calorie malnutrition: Secondary | ICD-10-CM

## 2013-05-11 DIAGNOSIS — M7981 Nontraumatic hematoma of soft tissue: Secondary | ICD-10-CM

## 2013-05-11 DIAGNOSIS — D638 Anemia in other chronic diseases classified elsewhere: Secondary | ICD-10-CM

## 2013-05-11 DIAGNOSIS — I700 Atherosclerosis of aorta: Secondary | ICD-10-CM | POA: Diagnosis present

## 2013-05-11 DIAGNOSIS — F411 Generalized anxiety disorder: Secondary | ICD-10-CM

## 2013-05-11 DIAGNOSIS — R799 Abnormal finding of blood chemistry, unspecified: Secondary | ICD-10-CM

## 2013-05-11 HISTORY — DX: Anemia in other chronic diseases classified elsewhere: D63.8

## 2013-05-11 LAB — TYPE AND SCREEN
Antibody Screen: NEGATIVE
Unit division: 0

## 2013-05-11 LAB — URINALYSIS W MICROSCOPIC + REFLEX CULTURE
Hgb urine dipstick: NEGATIVE
Ketones, ur: NEGATIVE mg/dL
Nitrite: NEGATIVE
Specific Gravity, Urine: 1.019 (ref 1.005–1.030)
pH: 5.5 (ref 5.0–8.0)

## 2013-05-11 LAB — HEMOGLOBIN AND HEMATOCRIT, BLOOD
HCT: 29.4 % — ABNORMAL LOW (ref 36.0–46.0)
HCT: 27.7 % — ABNORMAL LOW (ref 36.0–46.0)
Hemoglobin: 9.4 g/dL — ABNORMAL LOW (ref 12.0–15.0)
Hemoglobin: 8.9 g/dL — ABNORMAL LOW (ref 12.0–15.0)

## 2013-05-11 LAB — IRON AND TIBC
Saturation Ratios: 16 % — ABNORMAL LOW (ref 20–55)
TIBC: 232 ug/dL — ABNORMAL LOW (ref 250–470)
UIBC: 195 ug/dL (ref 125–400)

## 2013-05-11 LAB — GLUCOSE, CAPILLARY
Glucose-Capillary: 137 mg/dL — ABNORMAL HIGH (ref 70–99)
Glucose-Capillary: 120 mg/dL — ABNORMAL HIGH (ref 70–99)
Glucose-Capillary: 134 mg/dL — ABNORMAL HIGH (ref 70–99)

## 2013-05-11 LAB — RETICULOCYTES
RBC.: 3.11 MIL/uL — ABNORMAL LOW (ref 3.87–5.11)
Retic Ct Pct: 5.5 % — ABNORMAL HIGH (ref 0.4–3.1)

## 2013-05-11 LAB — FOLATE: Folate: >20 ng/mL

## 2013-05-11 MED ORDER — ENSURE PUDDING PO PUDG
1.0000 | ORAL | Status: DC
  Administered 2013-05-11 – 2013-05-14 (×3): 1 via ORAL
  Filled 2013-05-11 (×4): qty 1

## 2013-05-11 MED ORDER — BOOST / RESOURCE BREEZE PO LIQD
1.0000 | ORAL | Status: DC
  Administered 2013-05-14: 1 via ORAL

## 2013-05-11 MED ORDER — COLLAGENASE 250 UNIT/GM EX OINT
TOPICAL_OINTMENT | Freq: Every day | CUTANEOUS | Status: DC
  Administered 2013-05-11 – 2013-05-14 (×3): via TOPICAL
  Filled 2013-05-11: qty 30

## 2013-05-11 MED ORDER — PHYTONADIONE 5 MG PO TABS
2.5000 mg | ORAL_TABLET | Freq: Once | ORAL | Status: AC
  Administered 2013-05-12: 2.5 mg via ORAL
  Filled 2013-05-11: qty 1

## 2013-05-11 NOTE — Consult Note (Signed)
Called for rectus sheath hematoma on ct scan which was ordered for evaluation of low HGB.  She has not had signs of bleeding or any abdominal trauma.  She is supratherapeutic with her INR.  On coumadin for chronic a. Fib.  She is HD stable, vitals normal and her Hgb has responded appropriately after transfusion.  There is no evidence of intraperitoneal bleeding.  It would be very unlikely that any surgical intervention would be needed in these cases.  These usually resolve with nonop management.  However, I would hold anticoagulants and consider vitamin K.  Follow HGB and surgery will also follow along.

## 2013-05-11 NOTE — Progress Notes (Signed)
Echocardiogram 2D Echocardiogram has been performed.  [name] 05/11/2013, 9:59 AM

## 2013-05-11 NOTE — Progress Notes (Signed)
Clinical Social Work Department BRIEF PSYCHOSOCIAL ASSESSMENT 05/11/2013  Patient:  [name], [name]     Account Number:  [id number]     Admit date:  05/10/2013  Clinical Social Worker:  Lovenia Shuck  Date/Time:  05/10/2013 05:10 PM  Referred by:  Care Management  Date Referred:  05/10/2013  Other Referral:   Interview type:  Family Other interview type:    PSYCHOSOCIAL DATA Living Status:  FACILITY Admitted from facility:  [facility] Level of care:  Skilled Nursing Facility Primary support name:  [name] [phone] Primary support relationship to patient:  FAMILY Degree of support available:   Strong support.  Pts daughter-in-law  [name] was at bedside for several hours.  Pts son, [name] [phone] (c).  Daughter in law is unsure, but thinks HCPOA is pts daughter, [name] [phone]    CURRENT CONCERNS Current Concerns  Post-Acute Placement   Other Concerns:    SOCIAL WORK ASSESSMENT / PLAN CSW met with pt and family at bedside.  Pt was sleeping. Pts daugther in law, [name], was at her bedside. Daughter in law confirms that pt is a resident at [facility] and that the family plans for her to return once she is medically stable.   Assessment/plan status:  Psychosocial Support/Ongoing Assessment of Needs Other assessment/ plan:   Information/referral to community resources:   None Indentified at this time    PATIENT'S/FAMILY'S RESPONSE TO PLAN OF CARE: Pts daughter-in-law thanked CSW for concern and support. Family plans for pt to return to [facility] once pt is medically stable.     [name] Gosselin, LCSW [phone]  ED CSW .05/11/2013 737am  Late entry for evening CSW due to [place].

## 2013-05-11 NOTE — Progress Notes (Signed)
INITIAL NUTRITION ASSESSMENT  DOCUMENTATION CODES Per approved criteria  -Not Applicable   INTERVENTION: Provide Resource Breeze once daily Provide Ensure pudding once daily Provide Multivitamin with minerals daily  NUTRITION DIAGNOSIS: Predicted suboptimal energy intake related to poor appetite as evidenced by pt's report.   Goal: Pt to meet >/= 90% of their estimated nutrition needs   Monitor:  PO intake Weight Labs  Reason for Assessment: Low Braden  79 y.o. female  Admitting Dx: <principal problem not specified>  ASSESSMENT: 79 y.o. female with multiple comorbidities including chronic bilateral lower extremity ulcers, peripheral vascular disease, venous stasis insufficiency, type 2 diabetes, who was admitted to her service back on 01/26/2013 for her lower extremity cellulitis and 04/21/2013 for failure to thrive. She has had more fatigue and lethargy. As a result, CBC was obtained and it was 6.1. As a result, she was transferred to the ED for further evaluation.  Pt tired at time of visit and difficult to assess. Pt states that she has not had anything to eat or drink today. She reports having a poor appetite but, she is unable to provide further detail about PO intake PTA. Reported usual body weight during last admission was 163 lbs.   Height: Ht Readings from Last 1 Encounters:  05/10/13 5\' 6"  (1.676 m)    Weight: Wt Readings from Last 1 Encounters:  05/10/13 184 lb 8.4 oz (83.7 kg)    Ideal Body Weight: 130 lbs  % Ideal Body Weight: 141%  Wt Readings from Last 10 Encounters:  05/10/13 184 lb 8.4 oz (83.7 kg)  04/25/13 184 lb 1.4 oz (83.5 kg)  04/05/13 178 lb (80.74 kg)  02/13/13 189 lb 2 oz (85.787 kg)  01/27/13 189 lb 2.5 oz (85.8 kg)  11/28/12 163 lb (73.936 kg)  07/13/12 163 lb (73.936 kg)  03/23/12 160 lb 6.4 oz (72.757 kg)  01/11/12 153 lb 6 oz (69.57 kg)  07/13/11 188 lb (85.276 kg)    Usual Body Weight: 163 lbs  % Usual Body Weight:  113%  BMI:  Body mass index is 29.8 kg/(m^2).  Estimated Nutritional Needs: Kcal: 1600-1800 Protein: 90-100 grams Fluid: 1.8- 2 L/day  Skin: non-pitting RLE and LLE edema; wounds on left and right leg; breakdown of skin on groin and buttocks  Diet Order: Dysphagia  EDUCATION NEEDS: -No education needs identified at this time   Intake/Output Summary (Last 24 hours) at 05/11/13 1153 Last data filed at 05/10/13 2335  Gross per 24 hour  Intake    900 ml  Output      0 ml  Net    900 ml    Last BM: PTA   Labs:   Recent Labs Lab 05/10/13 1450  NA 140  K 3.9  CL 105  CO2 26  BUN 50*  CREATININE 1.56*  CALCIUM 9.1  GLUCOSE 169*    CBG (last 3)   Recent Labs  05/10/13 2040 05/11/13 0746  GLUCAP 120* 116*    Scheduled Meds: . atorvastatin  20 mg Oral q1800  . cholecalciferol  1,000 Units Oral Daily  . collagenase   Topical Daily  . diltiazem  120 mg Oral Daily  . ferrous gluconate  324 mg Oral Q breakfast  . furosemide  20 mg Oral Daily  . insulin aspart  0-9 Units Subcutaneous TID WC  . labetalol  300 mg Oral BID  . levothyroxine  25 mcg Oral QAC breakfast  . multivitamin with minerals  1 tablet Oral Daily  .  sodium bicarbonate  650 mg Oral TID  . sodium chloride  3 mL Intravenous Q12H  . zinc sulfate  220 mg Oral Daily    Continuous Infusions:   Past Medical History  Diagnosis Date  . Obesity   . Anemia, iron deficiency   . Depression   . Diabetes mellitus type II   . Hypertension   . Peptic ulcer   . History of uterine fibroid   . Hx of colonic polyps   . Diverticula, colon   . Osteoarthritis   . Low back pain   . Atrial fibrillation     permanent, on coumadin (followed by [facility] coumadin clinic)  . Pulmonary hypertension     echo 4/12:  EF 55-60%, mod LVH, mild BAE, RVE and PASP 51  . Anxiety   . DIABETES MELLITUS, TYPE II 03/05/2007  . HYPERLIPIDEMIA 03/05/2007  . ANEMIA-IRON DEFICIENCY 03/05/2007  . HYPERTENSION 03/05/2007  . DEPRESSION  03/05/2007  . LOW BACK PAIN 03/05/2007  . PERIPHERAL EDEMA 04/29/2008  . Chronic pain syndrome 06/23/2010  . FIBROIDS, UTERUS 03/05/2007  . PEPTIC ULCER DISEASE 03/05/2007  . DIVERTICULOSIS, COLON 03/05/2007  . OSTEOARTHRITIS 03/05/2007  . COLONIC POLYPS, HX OF 03/05/2007  . Hypothyroidism 10/15/2010  . Renal insufficiency 10/15/2010  . Atrial fibrillation     Past Surgical History  Procedure Laterality Date  . Laparoscopic hysterectomy      Ian Malkin RD, LDN Inpatient Clinical Dietitian Pager: [phone] After Hours Pager: [phone]

## 2013-05-11 NOTE — Progress Notes (Signed)
Pt has breakdown of skin with skin tear to right lower buttocks and all of buttocks and groin with discoloration and flaking of skin.  MD please assess pt's need for Foley catheter, as she is incontinent.

## 2013-05-11 NOTE — Consult Note (Addendum)
Consultation  Referring Provider:     [facility] Primary Care Physician:  Oliver Barre, MD Primary Gastroenterologist:    Dr. Russella Dar     Reason for Consultation:     anemia         HPI:   [name] is a 79 y.o. female with multiple chronic medical problems who was admitted with a 2 g drop in Hgb and no signs of bleeding and a heme negative stool. She has been weak and tired and in and out of hospital with failure to thrive and chronic PVD and leg ulcers. She has an elevated INR. She is not having abdominal pain, melena, hematochezia or other GI sxs. She says she is thirsty and hungry. She came from SNF.  Past Medical History  Diagnosis Date  . Obesity   . Anemia, iron deficiency   . Depression   . Diabetes mellitus type II   . Hypertension   . Peptic ulcer   . History of uterine fibroid   . Hx of colonic polyps   . Diverticula, colon   . Osteoarthritis   . Low back pain   . Atrial fibrillation     permanent, on coumadin (followed by [facility] coumadin clinic)  . Pulmonary hypertension     echo 4/12:  EF 55-60%, mod LVH, mild BAE, RVE and PASP 51  . Anxiety   . DIABETES MELLITUS, TYPE II 03/05/2007  . HYPERLIPIDEMIA 03/05/2007  . ANEMIA-IRON DEFICIENCY 03/05/2007  . HYPERTENSION 03/05/2007  . DEPRESSION 03/05/2007  . LOW BACK PAIN 03/05/2007  . PERIPHERAL EDEMA 04/29/2008  . Chronic pain syndrome 06/23/2010  . FIBROIDS, UTERUS 03/05/2007  . PEPTIC ULCER DISEASE 03/05/2007  . DIVERTICULOSIS, COLON 03/05/2007  . OSTEOARTHRITIS 03/05/2007  . COLONIC POLYPS, HX OF 03/05/2007  . Hypothyroidism 10/15/2010  . Renal insufficiency 10/15/2010  . Atrial fibrillation     Past Surgical History  Procedure Laterality Date  . Laparoscopic hysterectomy    Colonoscopy  Family History  Problem Relation Age of Onset  . Hypertension Sister      History  Substance Use Topics  . Smoking status: Former Smoker    Start date: 03/23/1972    Quit date: 05/10/1973  . Smokeless tobacco: Never Used  .  Alcohol Use: No    Prior to Admission medications   Medication Sig Start Date End Date Taking? Authorizing Provider  ALPRAZolam (XANAX) 0.25 MG tablet Take 1 tablet (0.25 mg total) by mouth 2 (two) times daily as needed. For anxiety 04/26/13  Yes Tiffany L Reed, DO  atorvastatin (LIPITOR) 20 MG tablet Take 1 tablet (20 mg total) by mouth daily at 6 PM. 04/25/13  Yes Marinda Elk, MD  Cholecalciferol (VITAMIN D) 1000 UNITS capsule Take 1,000 Units by mouth daily.    Yes Historical Provider, MD  diltiazem (CARDIZEM CD) 120 MG 24 hr capsule Take 120 mg by mouth daily.   Yes Historical Provider, MD  docusate sodium (COLACE) 100 MG capsule Take 100 mg by mouth 2 (two) times daily as needed. For constipation    Yes Historical Provider, MD  ferrous gluconate (FERGON) 325 MG tablet Take 325 mg by mouth daily with breakfast.   Yes Historical Provider, MD  furosemide (LASIX) 40 MG tablet Take 0.5 tablets (20 mg total) by mouth daily. 04/25/13  Yes Marinda Elk, MD  hydrALAZINE (APRESOLINE) 50 MG tablet Take 50 mg by mouth 3 (three) times daily.   Yes Historical Provider, MD  HYDROcodone-acetaminophen [facility])  7.5-325 MG per tablet Take 1 tablet by mouth every 6 (six) hours as needed for pain. DO NOT EXCEED 4 GM OF TYLENOL IN 24 HOURS 04/26/13  Yes Tiffany L Reed, DO  labetalol (NORMODYNE) 300 MG tablet Take 300 mg by mouth 2 (two) times daily.   Yes Historical Provider, MD  levothyroxine (SYNTHROID, LEVOTHROID) 25 MCG tablet Take 25 mcg by mouth daily before breakfast.   Yes Historical Provider, MD  Multiple Vitamins-Minerals (MULTIVITAMIN WITH MINERALS) tablet Take 1 tablet by mouth daily.   Yes Historical Provider, MD  ondansetron (ZOFRAN) 4 MG tablet Take 1 tablet (4 mg total) by mouth every 8 (eight) hours as needed for nausea. 02/13/13  Yes Corwin Levins, MD  Oxycodone HCl 10 MG TABS Take one tablet by mouth one hour prior to wound treatment 05/03/13  Yes Tiffany L Reed, DO  sodium  bicarbonate 650 MG tablet Take 1 tablet (650 mg total) by mouth 3 (three) times daily. 04/25/13  Yes Marinda Elk, MD  warfarin (COUMADIN) 5 MG tablet Take 5-7.5 mg by mouth daily. Take 7.5mg  on Monday, Wednesday, Friday. Take 5mg  the rest of the week.   Yes Historical Provider, MD  zinc sulfate 220 MG capsule Take 220 mg by mouth daily.   Yes Historical Provider, MD    Current Facility-Administered Medications  Medication Dose Route Frequency Provider Last Rate Last Dose  . acetaminophen (TYLENOL) tablet 650 mg  650 mg Oral Q6H PRN Catarina Hartshorn, MD       Or  . acetaminophen (TYLENOL) suppository 650 mg  650 mg Rectal Q6H PRN Catarina Hartshorn, MD      . atorvastatin (LIPITOR) tablet 20 mg  20 mg Oral q1800 Catarina Hartshorn, MD      . cholecalciferol (VITAMIN D) tablet 1,000 Units  1,000 Units Oral Daily Catarina Hartshorn, MD   1,000 Units at 05/11/13 [phone]  . collagenase (SANTYL) ointment   Topical Daily Kathlen Mody, MD      . diltiazem (CARDIZEM CD) 24 hr capsule 120 mg  120 mg Oral Daily Catarina Hartshorn, MD   120 mg at 05/11/13 4132  . docusate sodium (COLACE) capsule 100 mg  100 mg Oral BID PRN Catarina Hartshorn, MD      . ferrous gluconate [facility]) tablet 324 mg  324 mg Oral Q breakfast Catarina Hartshorn, MD   324 mg at 05/11/13 0809  . furosemide (LASIX) tablet 20 mg  20 mg Oral Daily Catarina Hartshorn, MD   20 mg at 05/11/13 4401  . HYDROcodone-acetaminophen (NORCO) 7.5-325 MG per tablet 1 tablet  1 tablet Oral Q6H PRN Catarina Hartshorn, MD      . insulin aspart (novoLOG) injection 0-9 Units  0-9 Units Subcutaneous TID WC Catarina Hartshorn, MD      . labetalol (NORMODYNE) tablet 300 mg  300 mg Oral BID Catarina Hartshorn, MD   300 mg at 05/11/13 0272  . levothyroxine (SYNTHROID, LEVOTHROID) tablet 25 mcg  25 mcg Oral QAC breakfast Catarina Hartshorn, MD   25 mcg at 05/11/13 0809  . multivitamin with minerals tablet 1 tablet  1 tablet Oral Daily Catarina Hartshorn, MD   1 tablet at 05/11/13 [phone]  . ondansetron (ZOFRAN) tablet 4 mg  4 mg Oral Q6H PRN Catarina Hartshorn, MD       Or  .  ondansetron [facility]) injection 4 mg  4 mg Intravenous Q6H PRN Catarina Hartshorn, MD      . oxyCODONE (Oxy IR/ROXICODONE) immediate release tablet 10 mg  10 mg Oral Daily PRN Catarina Hartshorn, MD      . sodium bicarbonate tablet 650 mg  650 mg Oral TID Catarina Hartshorn, MD   650 mg at 05/11/13 5621  . sodium chloride 0.9 % injection 3 mL  3 mL Intravenous Q12H Catarina Hartshorn, MD   3 mL at 05/11/13 0923  . zinc sulfate capsule 220 mg  220 mg Oral Daily Catarina Hartshorn, MD   220 mg at 05/11/13 3086    Allergies as of 05/10/2013 - Review Complete 05/10/2013  Allergen Reaction Noted  . Clonidine derivatives Itching and Rash 05/18/2011     Review of Systems:    All systems reviewed and negative except where noted in HPI and leg pain (Una Boots).      Physical Exam:  Vital signs in last 24 hours: Temp:  [97.5 F (36.4 C)-99.1 F (37.3 C)] 97.8 F (36.6 C) (11/14 0453) Pulse Rate:  [63-81] 77 (11/14 0453) Resp:  [12-17] 16 (11/14 0453) BP: (124-154)/(58-85) 137/76 mmHg (11/14 0453) SpO2:  [99 %-100 %] 100 % (11/14 0453) Weight:  [184 lb 8.4 oz (83.7 kg)] 184 lb 8.4 oz (83.7 kg) (11/13 2005)   General:   Pleasant elderly and chronically ill female in NAD Head:  Normocephalic and atraumatic. Eyes:   No icterus.   Conjunctiva pink. Mouth: a few teeth remain Neck:  Supple; no masses felt Lungs:  Respirations even and unlabored. Lungs clear to auscultation bilaterally.   No wheezes, crackles, or rhonchi.  Heart:  S1S2 Abdomen:  Soft, nondistended, nontender. Normal bowel sounds. No appreciable masses or hepatomegaly.  Extremities:  Bilateral Una boots Neurologic:  Alert and  oriented x4;  grossly normal neurologically. Psych:  Alert and cooperative. Normal affect.  LAB RESULTS:  Recent Labs  05/10/13 1450 05/11/13 1055  WBC 7.8  --   HGB 6.0* 9.4*  HCT 19.4* 29.4*  PLT 324  --    BMET  Recent Labs  05/10/13 1450  NA 140  K 3.9  CL 105  CO2 26  GLUCOSE 169*  BUN 50*  CREATININE 1.56*  CALCIUM 9.1       PT/INR  Recent Labs  05/10/13 1450  LABPROT 38.7*  INR 4.17*   Lab Results  Component Value Date   IRON 52 07/13/2011     STUDIES: Dg Chest 2 View  05/10/2013   CLINICAL DATA:  79 year old female altered mental status and weakness. Initial encounter.  EXAM: CHEST  2 VIEW  COMPARISON:  04/21/2013 and earlier.  FINDINGS: Semi upright AP and lateral views of the chest. New small bilateral pleural effusions. Increased bibasilar opacity most resembling atelectasis. Stable cardiomegaly and mediastinal contours. Pulmonary vascular congestion without overt edema. Stable visualized osseous structures.  IMPRESSION: Increased vascular congestion without overt edema. New small bilateral pleural effusions and bibasilar atelectasis. Stable cardiomegaly.   Electronically Signed   By: Augusto Gamble M.D.   On: 05/10/2013 16:03   Ct Head Wo Contrast  05/10/2013   CLINICAL DATA:  79 year old female with weakness. Altered mental status. Initial encounter.  EXAM: CT HEAD WITHOUT CONTRAST  TECHNIQUE: Contiguous axial images were obtained from the base of the skull through the vertex without intravenous contrast.  COMPARISON:  Brain MRI 04/23/2013 and earlier.  FINDINGS: Visualized paranasal sinuses and mastoids are clear. Calcified atherosclerosis at the skull base. Stable orbit and scalp soft tissues. Stable visualized osseous structures.  Stable cerebral volume. No ventriculomegaly. Patchy in confluent cerebral white matter hypodensity is stable. Chronic lacunar infarcts  in the bilateral deep gray matter nuclei (left lentiform and right thalamus) appear stable. No midline shift, mass effect, or evidence of intracranial mass lesion. No suspicious intracranial vascular hyperdensity. No acute intracranial hemorrhage identified. No evidence of cortically based acute infarction identified.  IMPRESSION: Stable chronic small vessel ischemia. No acute intracranial abnormality.   Electronically Signed   By: Augusto Gamble  M.D.   On: 05/10/2013 16:27     PREVIOUS ENDOSCOPIES:            Negative colonoscopy 2009 [facility]   Impression / Plan:   1) acute/subacute decrease in Hgb w/o GI bleeding 2) Anemia of Chronic disease  3) Over-anti-coagulated 4) Numerous co-morbidities 5) Severe malnutrition  Will check unenhanced CT abd/pelvis to look for retroperitoneal bleed Cancel hemoccults - neg x 1 a + hemoccult will not change eval, melena or hematochezia might We will f/u tomorrow but I doubt endoscopy will be indicated or needed   Thanks  Iva Boop, MD, [facility] [phone] (pager) 05/11/2013 12:26 PM

## 2013-05-11 NOTE — Consult Note (Addendum)
WOC reviewed current medical record. Pt has been followed both by Dr. Jimmey Ralph at the wound care center and by Dr. Leanord Hawking within the SNF she currently resides.  Dr. Leanord Hawking reports at last assessment 05/01/13: Chronic venous insufficiency with PAD.  Wounds are very painful and required lidocaine and oxycodone for debridement.  Using Santyl and Medihoney to debride these areas. Also noted both on Dr. Jannetta Quint note 04/27/13 and 05/01/13 due to pain and PAD pt can only tolerate dry boot, therefore she does not have Unna's in place. She had only kerlix and coban for very gentle compression.  I will verify with wound care center and/or [facility] wound care MD the frequency at which these are being changed but based on the documentation it appears weekly with serial debridements which will need to be performed by the MD with use of analgesics.    I will communicate new orders for frequency of dressing changes and wraps as soon as I am able to verify with either the wound care center or [facility] staff.   Re consult if needed, will not follow at this time. Thanks  Nicie Milan [employer], CWOCN [phone])    Spoke with treatment nurse at [facility]. Per Dr. Leanord Hawking (wound care MD-[facility]) Pt ordered daily dressing, remove kerlix and coban. Apply Santyl and Medihoney to the wounds cover with nonadherent and ABD pads for absorbency. Wrap with kerlix and coban. Requires premedication with 10mg  Oxycodone 1 hour prior to dressing changes.  Will write orders for same with exception the hospital does not stock Medihoney therefore I will omit this from the current orders.    Davina Poke RN,CWOCN [phone]

## 2013-05-11 NOTE — Progress Notes (Signed)
[facility] PROGRESS NOTE  [name] QMV:[id number] DOB: [birth year] DOA: 05/10/2013 PCP: Oliver Barre, MD  Assessment/Plan: Anemia  -Concerning for GI bleed  -Hemoglobin was 8.9 on 04/25/2013  -ED has ordered 2 units PRBC  -Given the fairly rapid hemoglobin drop--consulted GI  -Iron, TIBC, B12, RBC folate  Ordered.  Lethargy and fatigue  -Likely do to her anemia  -UA with reflex 2 urine culture  -TSH 2.110 on 05/07/2013   Acute on Chronic diastolic CHF  -Patient's proBNP of the baseline when compared to one year ago  -Likely due to the patient's anemia  -There is no respiratory distress  -Patient normally takes Lasix 20 mg po daily  -Echocardiogram shows mod pulmonary hypertension.   Chronic lower extremity venous stasis with ulcerations  -Patient currently has UNNA boots  -Consult wound care nurse  Diabetes mellitus type 2  -Hemoglobin A1c 6.8 on 01/26/2013  -Novolin sliding scale  - CBG (last 3)   Recent Labs  05/11/13 0746 05/11/13 1255 05/11/13 1727  GLUCAP 116* 137* 120*     Chronic atrial fibrillation  -Rate controlled  -Hold Coumadin as INR is supratherapeutic  -Continue Cardizem and labetalol  Hypertension  -Hold hydralazine for now and monitor due to soft bp  -Continue Cardizem and labetalol as discussed      Code Status: full code Family Communication: none at bedside Disposition Plan: PENDING.    Consultants:  gastroenterology  Procedures:  CT ABD AND PELVIS  Antibiotics: none HPI/Subjective: Pain in the legs better controlled.  Objective: Filed Vitals:   05/11/13 1412  BP: 116/56  Pulse: 88  Temp: 98.4 F (36.9 C)  Resp: 16    Intake/Output Summary (Last 24 hours) at 05/11/13 1744 Last data filed at 05/11/13 1413  Gross per 24 hour  Intake   1260 ml  Output      0 ml  Net   1260 ml   Filed Weights   05/10/13 2005  Weight: 83.7 kg (184 lb 8.4 oz)    Exam: General: A&O x 2, NAD, nontoxic,  pleasant/cooperative CV: IRRR, no rub  Lung: Fine bibasilar crackles without any wheezing. Good air movement.  Abdomen: soft/NT, +BS, nondistended, no peritoneal signs  Ext: Bilateral lower extremities in UNNA boots, 1+LE edema   Data Reviewed: Basic Metabolic Panel:  Recent Labs Lab 05/10/13 1450  NA 140  K 3.9  CL 105  CO2 26  GLUCOSE 169*  BUN 50*  CREATININE 1.56*  CALCIUM 9.1   Liver Function Tests: No results found for this basename: AST, ALT, ALKPHOS, BILITOT, PROT, ALBUMIN,  in the last 168 hours No results found for this basename: LIPASE, AMYLASE,  in the last 168 hours No results found for this basename: AMMONIA,  in the last 168 hours CBC:  Recent Labs Lab 05/10/13 1450 05/11/13 1055  WBC 7.8  --   HGB 6.0* 9.4*  HCT 19.4* 29.4*  MCV 99.5  --   PLT 324  --    Cardiac Enzymes: No results found for this basename: CKTOTAL, CKMB, CKMBINDEX, TROPONINI,  in the last 168 hours BNP (last 3 results)  Recent Labs  01/26/13 1659 01/29/13 0530 05/10/13 1525  PROBNP 3453.0* 3513.0* 3105.0*   CBG:  Recent Labs Lab 05/10/13 2040 05/11/13 0746 05/11/13 1255 05/11/13 1727  GLUCAP 120* 116* 137* 120*    Recent Results (from the past 240 hour(s))  MRSA PCR SCREENING     Status: None   Collection Time    05/10/13  9:10  PM      Result Value Range Status   MRSA by PCR NEGATIVE  NEGATIVE Final   Comment:            The GeneXpert MRSA Assay (FDA     approved for NASAL specimens     only), is one component of a     comprehensive MRSA colonization     surveillance program. It is not     intended to diagnose MRSA     infection nor to guide or     monitor treatment for     MRSA infections.     Studies: Dg Chest 2 View  05/10/2013   CLINICAL DATA:  79 year old female altered mental status and weakness. Initial encounter.  EXAM: CHEST  2 VIEW  COMPARISON:  04/21/2013 and earlier.  FINDINGS: Semi upright AP and lateral views of the chest. New small  bilateral pleural effusions. Increased bibasilar opacity most resembling atelectasis. Stable cardiomegaly and mediastinal contours. Pulmonary vascular congestion without overt edema. Stable visualized osseous structures.  IMPRESSION: Increased vascular congestion without overt edema. New small bilateral pleural effusions and bibasilar atelectasis. Stable cardiomegaly.   Electronically Signed   By: Augusto Gamble M.D.   On: 05/10/2013 16:03   Ct Head Wo Contrast  05/10/2013   CLINICAL DATA:  79 year old female with weakness. Altered mental status. Initial encounter.  EXAM: CT HEAD WITHOUT CONTRAST  TECHNIQUE: Contiguous axial images were obtained from the base of the skull through the vertex without intravenous contrast.  COMPARISON:  Brain MRI 04/23/2013 and earlier.  FINDINGS: Visualized paranasal sinuses and mastoids are clear. Calcified atherosclerosis at the skull base. Stable orbit and scalp soft tissues. Stable visualized osseous structures.  Stable cerebral volume. No ventriculomegaly. Patchy in confluent cerebral white matter hypodensity is stable. Chronic lacunar infarcts in the bilateral deep gray matter nuclei (left lentiform and right thalamus) appear stable. No midline shift, mass effect, or evidence of intracranial mass lesion. No suspicious intracranial vascular hyperdensity. No acute intracranial hemorrhage identified. No evidence of cortically based acute infarction identified.  IMPRESSION: Stable chronic small vessel ischemia. No acute intracranial abnormality.   Electronically Signed   By: Augusto Gamble M.D.   On: 05/10/2013 16:27    Scheduled Meds: . atorvastatin  20 mg Oral q1800  . cholecalciferol  1,000 Units Oral Daily  . collagenase   Topical Daily  . diltiazem  120 mg Oral Daily  . feeding supplement (ENSURE)  1 Container Oral Q24H  . feeding supplement (RESOURCE BREEZE)  1 Container Oral Q24H  . ferrous gluconate  324 mg Oral Q breakfast  . furosemide  20 mg Oral Daily  . insulin  aspart  0-9 Units Subcutaneous TID WC  . labetalol  300 mg Oral BID  . levothyroxine  25 mcg Oral QAC breakfast  . multivitamin with minerals  1 tablet Oral Daily  . sodium bicarbonate  650 mg Oral TID  . sodium chloride  3 mL Intravenous Q12H  . zinc sulfate  220 mg Oral Daily   Continuous Infusions:   Active Problems:   Abdominal aortic atherosclerosis   Severe protein-calorie malnutrition   Blood hemoglobin level less than 10 grams/deciliter   Anemia of chronic disease    Time spent: 25 min    Bayler Nehring  [facility] Pager [phone]. If 7PM-7AM, please contact night-coverage at [email or web address], password [facility] 05/11/2013, 5:44 PM  LOS: 1 day

## 2013-05-12 LAB — COMPREHENSIVE METABOLIC PANEL
AST: 18 U/L (ref 0–37)
Albumin: 2.5 g/dL — ABNORMAL LOW (ref 3.5–5.2)
BUN: 45 mg/dL — ABNORMAL HIGH (ref 6–23)
CO2: 28 mEq/L (ref 19–32)
Chloride: 102 mEq/L (ref 96–112)
Creatinine, Ser: 1.51 mg/dL — ABNORMAL HIGH (ref 0.50–1.10)
GFR calc non Af Amer: 32 mL/min — ABNORMAL LOW (ref >90)
Total Bilirubin: 1 mg/dL (ref 0.3–1.2)

## 2013-05-12 LAB — PROTIME-INR: Prothrombin Time: 40.7 seconds — ABNORMAL HIGH (ref 11.6–15.2)

## 2013-05-12 LAB — HEMOGLOBIN AND HEMATOCRIT, BLOOD
HCT: 27.5 % — ABNORMAL LOW (ref 36.0–46.0)
HCT: 27.6 % — ABNORMAL LOW (ref 36.0–46.0)
Hemoglobin: 9.3 g/dL — ABNORMAL LOW (ref 12.0–15.0)

## 2013-05-12 LAB — GLUCOSE, CAPILLARY
Glucose-Capillary: 133 mg/dL — ABNORMAL HIGH (ref 70–99)
Glucose-Capillary: 143 mg/dL — ABNORMAL HIGH (ref 70–99)
Glucose-Capillary: 110 mg/dL — ABNORMAL HIGH (ref 70–99)

## 2013-05-12 NOTE — Progress Notes (Signed)
Subjective: No overnight issues.  Denies any abdominal complaints  Objective: Vital signs in last 24 hours: Temp:  [98.3 F (36.8 C)-98.5 F (36.9 C)] 98.3 F (36.8 C) (11/15 0604) Pulse Rate:  [65-88] 65 (11/15 0604) Resp:  [16-18] 18 (11/15 0604) BP: (116-157)/(56-77) 157/77 mmHg (11/15 0604) SpO2:  [100 %] 100 % (11/15 0604) Weight:  [183 lb 6.8 oz (83.2 kg)] 183 lb 6.8 oz (83.2 kg) (11/15 0604)    Intake/Output from previous day: 11/14 0701 - 11/15 0700 In: 480 [P.O.:480] Out: 500 [Urine:500] Intake/Output this shift:    General appearance: alert, cooperative and no distress GI: soft, nontender to exam, ND, the left rectus area does feel more firm consistent with known hematoma but no evidence of bruising or expansion, no peritoneal signs  Lab Results:   Recent Labs  05/10/13 1450  05/11/13 1756 05/12/13 0200  WBC 7.8  --   --   --   HGB 6.0*  < > 8.9* 8.7*  HCT 19.4*  < > 27.7* 27.5*  PLT 324  --   --   --   < > = values in this interval not displayed. BMET  Recent Labs  05/10/13 1450 05/12/13 0200  NA 140 136  K 3.9 3.5  CL 105 102  CO2 26 28  GLUCOSE 169* 149*  BUN 50* 45*  CREATININE 1.56* 1.51*  CALCIUM 9.1 8.8   PT/INR  Recent Labs  05/10/13 1450 05/12/13 0200  LABPROT 38.7* 40.7*  INR 4.17* 4.46*   ABG No results found for this basename: PHART, PCO2, PO2, HCO3,  in the last 72 hours  Studies/Results: Ct Abdomen Pelvis Wo Contrast  05/11/2013   CLINICAL DATA:  Evaluate for retroperitonal bleed  EXAM: CT ABDOMEN AND PELVIS WITHOUT CONTRAST  TECHNIQUE: Multidetector CT imaging of the abdomen and pelvis was performed following the standard protocol without intravenous contrast.  COMPARISON:  CT HIP*L* W/O CM dated 03/20/2011; CT ABD/PELV WO CM dated 03/20/2011  FINDINGS: There are bilateral small to moderate pleural effusions. The heart is enlarged. No pericardial fluid.  There is a rectus sheath hematoma on the left with the hematoma  measuring 8.9 x 5.2 cm in axial dimension and extending approximately 18 cm in craniocaudad dimension. Estimated volume of this hematoma is 400 cubic cm.  No focal hepatic lesions non contrast exam. The gallbladder is mildly distended at 5.2 cm. There is dependent sludge and small stones within the gallbladder. The pancreas is normal. The spleen is normal. Adrenal glands are thickened with low attenuation consistent with hyperplasia or adenomas. There is a low-density cyst within the left kidney. No hydronephrosis. There is a coarse calcification in the right kidney measuring 6 mm.  The stomach, small bowel, cecum are normal. The colon is normal. There is a large stool ball in the rectum measuring 8.7 cm diameter.  The abdominal aorta is calcified but not aneurysmal. No free fluid the pelvis. Post hysterectomy. Bladder is normal.  Degenerative osteophytosis of the thoracic spine. Anterolisthesis of L4 on L5.  IMPRESSION: 1. Moderate to large acute rectus sheath hematoma on the left with a calculated volume of 400 cubic cm. 2. A mildly distended gallbladder with dependent gallstones and sludge. 3. Small bilateral pleural effusions small amount of intraperitoneal 4. Large stool ball in the rectum. Findings conveyed topatient's nurse Davonna Belling 05/11/2013 at18:04.   Electronically Signed   By: Genevive Bi M.D.   On: 05/11/2013 18:05   Dg Chest 2 View  05/10/2013  CLINICAL DATA:  79 year old female altered mental status and weakness. Initial encounter.  EXAM: CHEST  2 VIEW  COMPARISON:  04/21/2013 and earlier.  FINDINGS: Semi upright AP and lateral views of the chest. New small bilateral pleural effusions. Increased bibasilar opacity most resembling atelectasis. Stable cardiomegaly and mediastinal contours. Pulmonary vascular congestion without overt edema. Stable visualized osseous structures.  IMPRESSION: Increased vascular congestion without overt edema. New small bilateral pleural effusions and  bibasilar atelectasis. Stable cardiomegaly.   Electronically Signed   By: Augusto Gamble M.D.   On: 05/10/2013 16:03   Ct Head Wo Contrast  05/10/2013   CLINICAL DATA:  79 year old female with weakness. Altered mental status. Initial encounter.  EXAM: CT HEAD WITHOUT CONTRAST  TECHNIQUE: Contiguous axial images were obtained from the base of the skull through the vertex without intravenous contrast.  COMPARISON:  Brain MRI 04/23/2013 and earlier.  FINDINGS: Visualized paranasal sinuses and mastoids are clear. Calcified atherosclerosis at the skull base. Stable orbit and scalp soft tissues. Stable visualized osseous structures.  Stable cerebral volume. No ventriculomegaly. Patchy in confluent cerebral white matter hypodensity is stable. Chronic lacunar infarcts in the bilateral deep gray matter nuclei (left lentiform and right thalamus) appear stable. No midline shift, mass effect, or evidence of intracranial mass lesion. No suspicious intracranial vascular hyperdensity. No acute intracranial hemorrhage identified. No evidence of cortically based acute infarction identified.  IMPRESSION: Stable chronic small vessel ischemia. No acute intracranial abnormality.   Electronically Signed   By: Augusto Gamble M.D.   On: 05/10/2013 16:27    Anti-infectives: Anti-infectives   None      Assessment/Plan: s/p * No surgery found * She has the known left rectus sheath hematoma.  This appears to be contained within the abdominal wall on imaging.  This is asymptomatic.  Her HGB is stable this am but her INR still >4.  I would recommend correcting the INR and even discussing the need for anticoagulation at all given her baseline health.  For the hematoma, it would be unlikely that this requires any surgical intervention.  If there were signs of ongoing bleeding, or free bleeding or rupture into the peritoneum, or infection, she may require intervention but this is usually a self limiting problem.   LOS: 2 days    Lodema Pilot DAVID 05/12/2013

## 2013-05-12 NOTE — Progress Notes (Signed)
[facility] PROGRESS NOTE  [name] FAO:[id number] DOB: [birth year] DOA: 05/10/2013 PCP: Oliver Barre, MD  Assessment/Plan: Anemia  -Concerning for GI bleed  -Hemoglobin was 8.9 on 04/25/2013  -ED has ordered 2 units PRBC  -Given the fairly rapid hemoglobin drop--consulted GI  -Iron, TIBC, B12, RBC folate  Ordered.  - Her CT abd and pelvis showed rectal sheath hematoma. Surgery consulted, recommended no surgical intervention. 1 unit of FFP given in view of the supratherapeutic INR and the the large hematoma, and to prevent further bleeding int o the sheath.  Appreciate surgery recommendations.  - discussed in detail with the pt , son and daughter in law  Lethargy and fatigue  -Likely do to her anemia  -TSH 2.110 on 05/07/2013   Acute on Chronic diastolic CHF  -Patient's proBNP of the baseline when compared to one year ago  -Likely due to the patient's anemia  -There is no respiratory distress  -Patient normally takes Lasix 20 mg po daily  -Echocardiogram shows mod pulmonary hypertension.   Chronic lower extremity venous stasis with ulcerations  -Patient currently has UNNA boots  -Consult wound care nurse  Diabetes mellitus type 2  -Hemoglobin A1c 6.8 on 01/26/2013  -Novolin sliding scale  - CBG (last 3)   Recent Labs  05/11/13 2126 05/12/13 0759 05/12/13 1158  GLUCAP 134* 133* 143*     Chronic atrial fibrillation  -Rate controlled  -Hold Coumadin as INR is supratherapeutic  -Continue Cardizem and labetalol   Hypertension  -Hold hydralazine for now and monitor due to soft bp  -Continue Cardizem and labetalol as discussed      Code Status: full code Family Communication: none at bedside, spoke to son and daughter in law over the phone, discussed in detail. Disposition Plan: PENDING.    Consultants:  gastroenterology  Procedures:  CT ABD AND PELVIS  Antibiotics: none HPI/Subjective: Pain in the legs better controlled.  Objective: Filed  Vitals:   05/12/13 0604  BP: 157/77  Pulse: 65  Temp: 98.3 F (36.8 C)  Resp: 18    Intake/Output Summary (Last 24 hours) at 05/12/13 1426 Last data filed at 05/11/13 2100  Gross per 24 hour  Intake    120 ml  Output    500 ml  Net   -380 ml   Filed Weights   05/10/13 2005 05/12/13 0604  Weight: 83.7 kg (184 lb 8.4 oz) 83.2 kg (183 lb 6.8 oz)    Exam: General: A&O x 2, NAD, nontoxic, pleasant/cooperative CV: IRRR, no rub  Lung: Fine bibasilar crackles without any wheezing. Good air movement.  Abdomen: soft/NT, +BS, nondistended, no peritoneal signs  Ext: Bilateral lower extremities in UNNA boots, 1+LE edema   Data Reviewed: Basic Metabolic Panel:  Recent Labs Lab 05/10/13 1450 05/12/13 0200  NA 140 136  K 3.9 3.5  CL 105 102  CO2 26 28  GLUCOSE 169* 149*  BUN 50* 45*  CREATININE 1.56* 1.51*  CALCIUM 9.1 8.8   Liver Function Tests:  Recent Labs Lab 05/12/13 0200  AST 18  ALT 19  ALKPHOS 115  BILITOT 1.0  PROT 5.2*  ALBUMIN 2.5*   No results found for this basename: LIPASE, AMYLASE,  in the last 168 hours No results found for this basename: AMMONIA,  in the last 168 hours CBC:  Recent Labs Lab 05/10/13 1450 05/11/13 1055 05/11/13 1756 05/12/13 0200 05/12/13 1004  WBC 7.8  --   --   --   --   HGB 6.0*  9.4* 8.9* 8.7* 9.3*  HCT 19.4* 29.4* 27.7* 27.5* 29.7*  MCV 99.5  --   --   --   --   PLT 324  --   --   --   --    Cardiac Enzymes: No results found for this basename: CKTOTAL, CKMB, CKMBINDEX, TROPONINI,  in the last 168 hours BNP (last 3 results)  Recent Labs  01/26/13 1659 01/29/13 0530 05/10/13 1525  PROBNP 3453.0* 3513.0* 3105.0*   CBG:  Recent Labs Lab 05/11/13 1255 05/11/13 1727 05/11/13 2126 05/12/13 0759 05/12/13 1158  GLUCAP 137* 120* 134* 133* 143*    Recent Results (from the past 240 hour(s))  MRSA PCR SCREENING     Status: None   Collection Time    05/10/13  9:10 PM      Result Value Range Status   MRSA by  PCR NEGATIVE  NEGATIVE Final   Comment:            The GeneXpert MRSA Assay (FDA     approved for NASAL specimens     only), is one component of a     comprehensive MRSA colonization     surveillance program. It is not     intended to diagnose MRSA     infection nor to guide or     monitor treatment for     MRSA infections.     Studies: Ct Abdomen Pelvis Wo Contrast  05/11/2013   CLINICAL DATA:  Evaluate for retroperitonal bleed  EXAM: CT ABDOMEN AND PELVIS WITHOUT CONTRAST  TECHNIQUE: Multidetector CT imaging of the abdomen and pelvis was performed following the standard protocol without intravenous contrast.  COMPARISON:  CT HIP*L* W/O CM dated 03/20/2011; CT ABD/PELV WO CM dated 03/20/2011  FINDINGS: There are bilateral small to moderate pleural effusions. The heart is enlarged. No pericardial fluid.  There is a rectus sheath hematoma on the left with the hematoma measuring 8.9 x 5.2 cm in axial dimension and extending approximately 18 cm in craniocaudad dimension. Estimated volume of this hematoma is 400 cubic cm.  No focal hepatic lesions non contrast exam. The gallbladder is mildly distended at 5.2 cm. There is dependent sludge and small stones within the gallbladder. The pancreas is normal. The spleen is normal. Adrenal glands are thickened with low attenuation consistent with hyperplasia or adenomas. There is a low-density cyst within the left kidney. No hydronephrosis. There is a coarse calcification in the right kidney measuring 6 mm.  The stomach, small bowel, cecum are normal. The colon is normal. There is a large stool ball in the rectum measuring 8.7 cm diameter.  The abdominal aorta is calcified but not aneurysmal. No free fluid the pelvis. Post hysterectomy. Bladder is normal.  Degenerative osteophytosis of the thoracic spine. Anterolisthesis of L4 on L5.  IMPRESSION: 1. Moderate to large acute rectus sheath hematoma on the left with a calculated volume of 400 cubic cm. 2. A mildly  distended gallbladder with dependent gallstones and sludge. 3. Small bilateral pleural effusions small amount of intraperitoneal 4. Large stool ball in the rectum. Findings conveyed topatient's nurse Davonna Belling 05/11/2013 at18:04.   Electronically Signed   By: Genevive Bi M.D.   On: 05/11/2013 18:05   Dg Chest 2 View  05/10/2013   CLINICAL DATA:  79 year old female altered mental status and weakness. Initial encounter.  EXAM: CHEST  2 VIEW  COMPARISON:  04/21/2013 and earlier.  FINDINGS: Semi upright AP and lateral views of the chest. New small bilateral  pleural effusions. Increased bibasilar opacity most resembling atelectasis. Stable cardiomegaly and mediastinal contours. Pulmonary vascular congestion without overt edema. Stable visualized osseous structures.  IMPRESSION: Increased vascular congestion without overt edema. New small bilateral pleural effusions and bibasilar atelectasis. Stable cardiomegaly.   Electronically Signed   By: Augusto Gamble M.D.   On: 05/10/2013 16:03   Ct Head Wo Contrast  05/10/2013   CLINICAL DATA:  79 year old female with weakness. Altered mental status. Initial encounter.  EXAM: CT HEAD WITHOUT CONTRAST  TECHNIQUE: Contiguous axial images were obtained from the base of the skull through the vertex without intravenous contrast.  COMPARISON:  Brain MRI 04/23/2013 and earlier.  FINDINGS: Visualized paranasal sinuses and mastoids are clear. Calcified atherosclerosis at the skull base. Stable orbit and scalp soft tissues. Stable visualized osseous structures.  Stable cerebral volume. No ventriculomegaly. Patchy in confluent cerebral white matter hypodensity is stable. Chronic lacunar infarcts in the bilateral deep gray matter nuclei (left lentiform and right thalamus) appear stable. No midline shift, mass effect, or evidence of intracranial mass lesion. No suspicious intracranial vascular hyperdensity. No acute intracranial hemorrhage identified. No evidence of cortically  based acute infarction identified.  IMPRESSION: Stable chronic small vessel ischemia. No acute intracranial abnormality.   Electronically Signed   By: Augusto Gamble M.D.   On: 05/10/2013 16:27    Scheduled Meds: . atorvastatin  20 mg Oral q1800  . cholecalciferol  1,000 Units Oral Daily  . collagenase   Topical Daily  . diltiazem  120 mg Oral Daily  . feeding supplement (ENSURE)  1 Container Oral Q24H  . feeding supplement (RESOURCE BREEZE)  1 Container Oral Q24H  . ferrous gluconate  324 mg Oral Q breakfast  . furosemide  20 mg Oral Daily  . insulin aspart  0-9 Units Subcutaneous TID WC  . labetalol  300 mg Oral BID  . levothyroxine  25 mcg Oral QAC breakfast  . multivitamin with minerals  1 tablet Oral Daily  . sodium bicarbonate  650 mg Oral TID  . sodium chloride  3 mL Intravenous Q12H  . zinc sulfate  220 mg Oral Daily   Continuous Infusions:   Active Problems:   Abdominal aortic atherosclerosis   Severe protein-calorie malnutrition   Blood hemoglobin level less than 10 grams/deciliter   Anemia of chronic disease    Time spent: 25 min    Edrik Rundle  [facility] Pager [phone]. If 7PM-7AM, please contact night-coverage at [email or web address], password [facility] 05/12/2013, 2:26 PM  LOS: 2 days

## 2013-05-13 DIAGNOSIS — S301XXA Contusion of abdominal wall, initial encounter: Secondary | ICD-10-CM

## 2013-05-13 DIAGNOSIS — E119 Type 2 diabetes mellitus without complications: Secondary | ICD-10-CM

## 2013-05-13 LAB — HEMOGLOBIN AND HEMATOCRIT, BLOOD
HCT: 27.9 % — ABNORMAL LOW (ref 36.0–46.0)
HCT: 28.9 % — ABNORMAL LOW (ref 36.0–46.0)
Hemoglobin: 8.9 g/dL — ABNORMAL LOW (ref 12.0–15.0)
Hemoglobin: 9.2 g/dL — ABNORMAL LOW (ref 12.0–15.0)

## 2013-05-13 LAB — PREPARE FRESH FROZEN PLASMA: Unit division: 0

## 2013-05-13 LAB — GLUCOSE, CAPILLARY
Glucose-Capillary: 111 mg/dL — ABNORMAL HIGH (ref 70–99)
Glucose-Capillary: 140 mg/dL — ABNORMAL HIGH (ref 70–99)

## 2013-05-13 LAB — PROTIME-INR: INR: 1.72 — ABNORMAL HIGH (ref 0.00–1.49)

## 2013-05-13 NOTE — Progress Notes (Signed)
[facility] PROGRESS NOTE  [name] ZOX:[id number] DOB: Jan 09, 1932 DOA: 05/10/2013 PCP: Oliver Barre, MD  Assessment/Plan: Anemia  - Probably from the rectus sheath hematoma.  -Hemoglobin was 8.9 on 04/25/2013  -ED has ordered 2 units PRBC  -Given the fairly rapid hemoglobin drop--consulted GI  -Iron, TIBC, B12, RBC folate  Ordered, showed low iron levels. Ferritin is 120. Will start her on iron supplements.  - Her CT abd and pelvis showed rectal sheath hematoma. Surgery consulted, recommended no surgical intervention. 1 unit of FFP given in view of the supratherapeutic INR and the the large hematoma, and to prevent further bleeding int o the sheath.  Her repeat INR IS 1.7 Appreciate surgery recommendations.  - discussed in detail with the pt , son and daughter in law  Lethargy and fatigue  -Likely do to her anemia  -TSH 2.110 on 05/07/2013   Acute on Chronic diastolic CHF  -Patient's proBNP of the baseline when compared to one year ago  -Likely due to the patient's anemia  -There is no respiratory distress  -Patient normally takes Lasix 20 mg po daily  -Echocardiogram shows mod pulmonary hypertension.   Chronic lower extremity venous stasis with ulcerations  -Patient currently has UNNA boots  -Consult wound care nurse  Diabetes mellitus type 2  -Hemoglobin A1c 6.8 on 01/26/2013  -Novolin sliding scale  - CBG (last 3)   Recent Labs  05/12/13 2153 05/13/13 0749 05/13/13 1152  GLUCAP 110* 118* 111*     Chronic atrial fibrillation  -Rate controlled  -Hold Coumadin in view of her hematoma.  -Continue Cardizem and labetalol   Hypertension  -Hold hydralazine for now and monitor due to soft bp  -Continue Cardizem and labetalol as discussed      Code Status: full code Family Communication: none at bedside, spoke to son and daughter in law over the phone, discussed in detail. Disposition Plan: PENDING.    Consultants:  gastroenterology  Procedures:  CT  ABD AND PELVIS  Antibiotics: none HPI/Subjective: Pain in the legs better controlled.  Objective: Filed Vitals:   05/13/13 1416  BP: 119/64  Pulse: 82  Temp: 97.4 F (36.3 C)  Resp: 16    Intake/Output Summary (Last 24 hours) at 05/13/13 1715 Last data filed at 05/13/13 1230  Gross per 24 hour  Intake  372.5 ml  Output      0 ml  Net  372.5 ml   Filed Weights   05/10/13 2005 05/12/13 0604 05/13/13 0500  Weight: 83.7 kg (184 lb 8.4 oz) 83.2 kg (183 lb 6.8 oz) 86 kg (189 lb 9.5 oz)    Exam: General: A&O x 2, NAD, nontoxic, pleasant/cooperative CV: IRRR, no rub  Lung: Fine bibasilar crackles without any wheezing. Good air movement.  Abdomen: soft/NT, +BS, nondistended, no peritoneal signs  Ext: Bilateral lower extremities in UNNA boots, 1+LE edema   Data Reviewed: Basic Metabolic Panel:  Recent Labs Lab 05/10/13 1450 05/12/13 0200  NA 140 136  K 3.9 3.5  CL 105 102  CO2 26 28  GLUCOSE 169* 149*  BUN 50* 45*  CREATININE 1.56* 1.51*  CALCIUM 9.1 8.8   Liver Function Tests:  Recent Labs Lab 05/12/13 0200  AST 18  ALT 19  ALKPHOS 115  BILITOT 1.0  PROT 5.2*  ALBUMIN 2.5*   No results found for this basename: LIPASE, AMYLASE,  in the last 168 hours No results found for this basename: AMMONIA,  in the last 168 hours CBC:  Recent Labs  Lab 05/10/13 1450  05/12/13 0200 05/12/13 1004 05/12/13 1757 05/13/13 0145 05/13/13 1016  WBC 7.8  --   --   --   --   --   --   HGB 6.0*  < > 8.7* 9.3* 8.7* 8.9* 9.2*  HCT 19.4*  < > 27.5* 29.7* 27.6* 27.9* 28.9*  MCV 99.5  --   --   --   --   --   --   PLT 324  --   --   --   --   --   --   < > = values in this interval not displayed. Cardiac Enzymes: No results found for this basename: CKTOTAL, CKMB, CKMBINDEX, TROPONINI,  in the last 168 hours BNP (last 3 results)  Recent Labs  01/26/13 1659 01/29/13 0530 05/10/13 1525  PROBNP 3453.0* 3513.0* 3105.0*   CBG:  Recent Labs Lab 05/12/13 1158  05/12/13 1748 05/12/13 2153 05/13/13 0749 05/13/13 1152  GLUCAP 143* 158* 110* 118* 111*    Recent Results (from the past 240 hour(s))  MRSA PCR SCREENING     Status: None   Collection Time    05/10/13  9:10 PM      Result Value Range Status   MRSA by PCR NEGATIVE  NEGATIVE Final   Comment:            The GeneXpert MRSA Assay (FDA     approved for NASAL specimens     only), is one component of a     comprehensive MRSA colonization     surveillance program. It is not     intended to diagnose MRSA     infection nor to guide or     monitor treatment for     MRSA infections.     Studies: Ct Abdomen Pelvis Wo Contrast  05/11/2013   CLINICAL DATA:  Evaluate for retroperitonal bleed  EXAM: CT ABDOMEN AND PELVIS WITHOUT CONTRAST  TECHNIQUE: Multidetector CT imaging of the abdomen and pelvis was performed following the standard protocol without intravenous contrast.  COMPARISON:  CT HIP*L* W/O CM dated 03/20/2011; CT ABD/PELV WO CM dated 03/20/2011  FINDINGS: There are bilateral small to moderate pleural effusions. The heart is enlarged. No pericardial fluid.  There is a rectus sheath hematoma on the left with the hematoma measuring 8.9 x 5.2 cm in axial dimension and extending approximately 18 cm in craniocaudad dimension. Estimated volume of this hematoma is 400 cubic cm.  No focal hepatic lesions non contrast exam. The gallbladder is mildly distended at 5.2 cm. There is dependent sludge and small stones within the gallbladder. The pancreas is normal. The spleen is normal. Adrenal glands are thickened with low attenuation consistent with hyperplasia or adenomas. There is a low-density cyst within the left kidney. No hydronephrosis. There is a coarse calcification in the right kidney measuring 6 mm.  The stomach, small bowel, cecum are normal. The colon is normal. There is a large stool ball in the rectum measuring 8.7 cm diameter.  The abdominal aorta is calcified but not aneurysmal. No free  fluid the pelvis. Post hysterectomy. Bladder is normal.  Degenerative osteophytosis of the thoracic spine. Anterolisthesis of L4 on L5.  IMPRESSION: 1. Moderate to large acute rectus sheath hematoma on the left with a calculated volume of 400 cubic cm. 2. A mildly distended gallbladder with dependent gallstones and sludge. 3. Small bilateral pleural effusions small amount of intraperitoneal 4. Large stool ball in the rectum. Findings conveyed topatient's nurse Davonna Belling  05/11/2013 [id number].   Electronically Signed   By: Genevive Bi M.D.   On: 05/11/2013 18:05    Scheduled Meds: . atorvastatin  20 mg Oral q1800  . cholecalciferol  1,000 Units Oral Daily  . collagenase   Topical Daily  . diltiazem  120 mg Oral Daily  . feeding supplement (ENSURE)  1 Container Oral Q24H  . feeding supplement (RESOURCE BREEZE)  1 Container Oral Q24H  . ferrous gluconate  324 mg Oral Q breakfast  . furosemide  20 mg Oral Daily  . insulin aspart  0-9 Units Subcutaneous TID WC  . labetalol  300 mg Oral BID  . levothyroxine  25 mcg Oral QAC breakfast  . multivitamin with minerals  1 tablet Oral Daily  . sodium bicarbonate  650 mg Oral TID  . sodium chloride  3 mL Intravenous Q12H  . zinc sulfate  220 mg Oral Daily   Continuous Infusions:   Active Problems:   Abdominal aortic atherosclerosis   Severe protein-calorie malnutrition   Blood hemoglobin level less than 10 grams/deciliter   Anemia of chronic disease    Time spent: 25 min    Ambers Iyengar  [facility] Pager [phone]. If 7PM-7AM, please contact night-coverage at [email or web address], password [facility] 05/13/2013, 5:15 PM  LOS: 3 days

## 2013-05-14 DIAGNOSIS — IMO0002 Reserved for concepts with insufficient information to code with codable children: Secondary | ICD-10-CM

## 2013-05-14 LAB — BASIC METABOLIC PANEL
BUN: 32 mg/dL — ABNORMAL HIGH (ref 6–23)
CO2: 28 mEq/L (ref 19–32)
Calcium: 9.3 mg/dL (ref 8.4–10.5)
Chloride: 105 mEq/L (ref 96–112)
Creatinine, Ser: 1.33 mg/dL — ABNORMAL HIGH (ref 0.50–1.10)
GFR calc Af Amer: 43 mL/min — ABNORMAL LOW (ref >90)
GFR calc non Af Amer: 37 mL/min — ABNORMAL LOW (ref >90)
Glucose, Bld: 120 mg/dL — ABNORMAL HIGH (ref 70–99)
Potassium: 3.3 mEq/L — ABNORMAL LOW (ref 3.5–5.1)
Sodium: 141 mEq/L (ref 135–145)

## 2013-05-14 LAB — GLUCOSE, CAPILLARY
Glucose-Capillary: 105 mg/dL — ABNORMAL HIGH (ref 70–99)
Glucose-Capillary: 150 mg/dL — ABNORMAL HIGH (ref 70–99)

## 2013-05-14 LAB — CBC
HCT: 28.5 % — ABNORMAL LOW (ref 36.0–46.0)
Hemoglobin: 9.1 g/dL — ABNORMAL LOW (ref 12.0–15.0)
MCH: 30.7 pg (ref 26.0–34.0)
MCHC: 31.9 g/dL (ref 30.0–36.0)
RBC: 2.96 MIL/uL — ABNORMAL LOW (ref 3.87–5.11)
WBC: 6.6 10*3/uL (ref 4.0–10.5)

## 2013-05-14 MED ORDER — ENSURE PUDDING PO PUDG: 1.0000 | ORAL | Status: DC

## 2013-05-14 MED ORDER — POTASSIUM CHLORIDE CRYS ER 20 MEQ PO TBCR
40.0000 meq | EXTENDED_RELEASE_TABLET | Freq: Two times a day (BID) | ORAL | Status: DC
  Administered 2013-05-14: 14:00:00 40 meq via ORAL
  Filled 2013-05-14 (×2): qty 2

## 2013-05-14 MED ORDER — BOOST / RESOURCE BREEZE PO LIQD: 1.0000 | ORAL | Status: DC

## 2013-05-14 MED ORDER — COLLAGENASE 250 UNIT/GM EX OINT: TOPICAL_OINTMENT | Freq: Every day | CUTANEOUS | Status: DC

## 2013-05-14 MED ORDER — HYDROCODONE-ACETAMINOPHEN 7.5-325 MG PO TABS: 1.0000 | ORAL_TABLET | Freq: Four times a day (QID) | ORAL | Status: DC | PRN

## 2013-05-14 NOTE — Progress Notes (Signed)
Patient is set to discharge back to [facility] today. Patient & son, Fayrene Fearing made aware. Discharge packet given to RN, Chloe. [facility] called for transport.   Unice Bailey, LCSW [facility] Clinical Social Worker cell #: [phone]

## 2013-05-14 NOTE — Progress Notes (Signed)
  Subjective: Denies abdominal pain.  Eating.  Bowels moving.  Objective: Vital signs in last 24 hours: Temp:  [97.4 F (36.3 C)-99 F (37.2 C)] 98.5 F (36.9 C) (11/17 0640) Pulse Rate:  [72-82] 75 (11/17 0643) Resp:  [16-20] 20 (11/17 0640) BP: (119-175)/(64-90) 168/77 mmHg (11/17 0643) SpO2:  [100 %] 100 % (11/17 0640) Weight:  [186 lb 11.7 oz (84.7 kg)] 186 lb 11.7 oz (84.7 kg) (11/17 0658) Last BM Date: 05/13/13  Intake/Output from previous day: 11/16 0701 - 11/17 0700 In: 480 [P.O.:480] Out: -  Intake/Output this shift:    PE: General- In NAD Abdomen-firm, nontender LUQ mass  Lab Results:   Recent Labs  05/13/13 1016 05/14/13 0500  WBC  --  6.6  HGB 9.2* 9.1*  HCT 28.9* 28.5*  PLT  --  250   BMET  Recent Labs  05/12/13 0200 05/14/13 0500  NA 136 141  K 3.5 3.3*  CL 102 105  CO2 28 28  GLUCOSE 149* 120*  BUN 45* 32*  CREATININE 1.51* 1.33*  CALCIUM 8.8 9.3   PT/INR  Recent Labs  05/12/13 0200 05/13/13 1016  LABPROT 40.7* 19.7*  INR 4.46* 1.72*   Comprehensive Metabolic Panel:    Component Value Date/Time   NA 141 05/14/2013 0500   K 3.3* 05/14/2013 0500   CL 105 05/14/2013 0500   CO2 28 05/14/2013 0500   BUN 32* 05/14/2013 0500   CREATININE 1.33* 05/14/2013 0500   GLUCOSE 120* 05/14/2013 0500   GLUCOSE 117* 05/16/2006 0957   CALCIUM 9.3 05/14/2013 0500   AST 18 05/12/2013 0200   ALT 19 05/12/2013 0200   ALKPHOS 115 05/12/2013 0200   BILITOT 1.0 05/12/2013 0200   PROT 5.2* 05/12/2013 0200   ALBUMIN 2.5* 05/12/2013 0200     Studies/Results: No results found.  Anti-infectives: Anti-infectives   None      Assessment Left rectus sheath hematoma from over-anticoagulation-hemoglobin is stable; no signs of active bleeding; no surgical intervention needed.   LOS: 4 days   Plan: Would not restart Coumadin for one to two weeks.  Please call if we can be of further assistance.   Jaxtin Raimondo J 05/14/2013

## 2013-05-14 NOTE — Discharge Summary (Signed)
Physician Discharge Summary  [name]:[id number] DOB: [birth year] DOA: 05/10/2013  PCP: Oliver Barre, MD  Admit date: 05/10/2013 Discharge date: 05/14/2013  Time spent: 35 minutes  Recommendations for Outpatient Follow-up:  1. Follow upwith PCP in 1 to2 weeks 2. Follow up with cardiology in 2 weeks about resuming coumadin   Discharge Diagnoses:  Principal Problem:   Hematoma of abdominal wall Active Problems:   DIABETES MELLITUS, TYPE II   Abdominal aortic atherosclerosis   Severe protein-calorie malnutrition   Blood hemoglobin level less than 10 grams/deciliter   Anemia of chronic disease   Discharge Condition: improved.   Diet recommendation: low sodium diet  Filed Weights   05/12/13 0604 05/13/13 0500 05/14/13 0658  Weight: 83.2 kg (183 lb 6.8 oz) 86 kg (189 lb 9.5 oz) 84.7 kg (186 lb 11.7 oz)    History of present illness:  79 y.o. female with multiple comorbidities including chronic bilateral lower extremity ulcers, peripheral vascular disease, venous stasis insufficiency, type 2 diabetes, who was admitted to her service back on 01/26/2013 for her lower extremity cellulitis and 04/21/2013 for failure to thrive. The patient resides at [facility]. For 1-2 days, the patient has not been herself. She has had more fatigue and lethargy. As a result, CBC was obtained and it was 6.1. As a result, she was transferred to the ED for further evaluation. The patient denies any fevers, chills, chest discomfort, shortness breath, nausea, vomiting, diarrhea, abdominal pain, dysuria. She denies any epistaxis, hematemesis, hemoptysis, hematochezia, melena, hematuria. The patient thinks that she may have had a colonoscopy approximately 10 years ago which was negative. The patient had a hemoglobin of 8.9 on 04/25/2013. Her baseline hemoglobin is 9-10.  In the ED, CT of the brain was negative for acute changes. The patient was hemodynamically stable with a blood pressure of 126/61 and oxygen  saturation of 100% on room air. Chest x-ray revealed increased vascular congestion with new bilateral small effusions. Fecal occult blood test was negative. Hemoglobin was noted to be 6.0. BMP was unremarkable except for creatinine of 1.56.    Hospital Course:  Anemia  - Probably from the rectus sheath hematoma.  -Hemoglobin was 8.9 on 04/25/2013  -ED has ordered 2 units PRBC  -Given the fairly rapid hemoglobin drop--consulted GI  -Iron, TIBC, B12, RBC folate Ordered, showed low iron levels. Ferritin is 120. Will start her on iron supplements.  - Her CT abd and pelvis showed rectal sheath hematoma. Surgery consulted, recommended no surgical intervention. 1 unit of FFP given in view of the supratherapeutic INR and the the large hematoma, and to prevent further bleeding int o the sheath. Her repeat INR IS 1.7 . Will continue to hold coumadin for 2 weeks and recommend follow upwith PCP and CARdiology about resuming it. Discussed the risks of holding coumadin. Appreciate surgery recommendations.  - discussed in detail with the pt , son and daughter in law  Lethargy and fatigue  -Likely do to her anemia  -TSH 2.110 on 05/07/2013  Acute on Chronic diastolic CHF  - resolved.  -Patient's proBNP of the baseline when compared to one year ago  -Likely due to the patient's anemia  -There is no respiratory distress  -Patient normally takes Lasix 20 mg po daily  -Echocardiogram shows mod pulmonary hypertension.  Chronic lower extremity venous stasis with ulcerations  -Patient currently has UNNA boots  -Consulted wound care nurse  Diabetes mellitus type 2  -Hemoglobin A1c 6.8 on 01/26/2013  -Novolin sliding scale  -  CBG (last 3)   Recent Labs   05/12/13 2153  05/13/13 0749  05/13/13 1152   GLUCAP  110*  118*  111*    Chronic atrial fibrillation  -Rate controlled  -Hold Coumadin in view of her hematoma for atleast 2 weeks as per surgery recommendations.  -Continue Cardizem and labetalol   Hypertension  -Continue Cardizem and labetalol as discussed    Procedures:  CT abd and pelvis  Consultations:  Surgery   GI  Discharge Exam: Filed Vitals:   05/14/13 0643  BP: 168/77  Pulse: 75  Temp:   Resp:     General: A&O x 2, NAD, nontoxic, pleasant/cooperative  CV: IRRR, no rub  Lung: Fine bibasilar crackles without any wheezing. Good air movement.  Abdomen: soft/NT, +BS, nondistended, no peritoneal signs  Ext: Bilateral lower extremities in UNNA boots, 1+LE edema   Discharge Instructions  Discharge Orders   Future Appointments Provider Department Dept Phone   07/20/2013 2:00 PM Nilda Riggs, NP [facility] [phone]   08/14/2013 10:45 AM Corwin Levins, MD [facility] [phone]   Future Orders Complete By Expires   Diet - low sodium heart healthy  As directed    Discharge instructions  As directed    Comments:     Follow up with PCP in one to two weeks We are currently holding coumadin for 2 weeks because of the hematoma int he abdominal wall.  Please follow up with PCP and cardiologist regarding resuming coumadin iafter 2 weeks.       Medication List    STOP taking these medications       Oxycodone HCl 10 MG Tabs     warfarin 5 MG tablet  Commonly known as:  COUMADIN      TAKE these medications       ALPRAZolam 0.25 MG tablet  Commonly known as:  XANAX  Take 1 tablet (0.25 mg total) by mouth 2 (two) times daily as needed. For anxiety     atorvastatin 20 MG tablet  Commonly known as:  LIPITOR  Take 1 tablet (20 mg total) by mouth daily at 6 PM.     collagenase ointment  Commonly known as:  SANTYL  Apply topically daily.     diltiazem 120 MG 24 hr capsule  Commonly known as:  CARDIZEM CD  Take 120 mg by mouth daily.     docusate sodium 100 MG capsule  Commonly known as:  COLACE  Take 100 mg by mouth 2 (two) times daily as needed. For constipation     feeding supplement (ENSURE)  Pudg  Take 1 Container by mouth daily.     feeding supplement (RESOURCE BREEZE) Liqd  Take 1 Container by mouth daily.     ferrous gluconate 325 MG tablet  Commonly known as:  FERGON  Take 325 mg by mouth daily with breakfast.     furosemide 40 MG tablet  Commonly known as:  LASIX  Take 0.5 tablets (20 mg total) by mouth daily.     hydrALAZINE 50 MG tablet  Commonly known as:  APRESOLINE  Take 50 mg by mouth 3 (three) times daily.     HYDROcodone-acetaminophen 7.5-325 MG per tablet  Commonly known as:  NORCO  Take 1 tablet by mouth every 6 (six) hours as needed. DO NOT EXCEED 4 GM OF TYLENOL IN 24 HOURS     labetalol 300 MG tablet  Commonly known as:  NORMODYNE  Take 300 mg by  mouth 2 (two) times daily.     levothyroxine 25 MCG tablet  Commonly known as:  SYNTHROID, LEVOTHROID  Take 25 mcg by mouth daily before breakfast.     multivitamin with minerals tablet  Take 1 tablet by mouth daily.     ondansetron 4 MG tablet  Commonly known as:  ZOFRAN  Take 1 tablet (4 mg total) by mouth every 8 (eight) hours as needed for nausea.     sodium bicarbonate 650 MG tablet  Take 1 tablet (650 mg total) by mouth 3 (three) times daily.     Vitamin D 1000 UNITS capsule  Take 1,000 Units by mouth daily.     zinc sulfate 220 MG capsule  Take 220 mg by mouth daily.       Allergies  Allergen Reactions  . Clonidine Derivatives Itching and Rash    Skin breaks out      The results of significant diagnostics from this hospitalization (including imaging, microbiology, ancillary and laboratory) are listed below for reference.    Significant Diagnostic Studies: Ct Abdomen Pelvis Wo Contrast  05/11/2013   CLINICAL DATA:  Evaluate for retroperitonal bleed  EXAM: CT ABDOMEN AND PELVIS WITHOUT CONTRAST  TECHNIQUE: Multidetector CT imaging of the abdomen and pelvis was performed following the standard protocol without intravenous contrast.  COMPARISON:  CT HIP*L* W/O CM dated 03/20/2011;  CT ABD/PELV WO CM dated 03/20/2011  FINDINGS: There are bilateral small to moderate pleural effusions. The heart is enlarged. No pericardial fluid.  There is a rectus sheath hematoma on the left with the hematoma measuring 8.9 x 5.2 cm in axial dimension and extending approximately 18 cm in craniocaudad dimension. Estimated volume of this hematoma is 400 cubic cm.  No focal hepatic lesions non contrast exam. The gallbladder is mildly distended at 5.2 cm. There is dependent sludge and small stones within the gallbladder. The pancreas is normal. The spleen is normal. Adrenal glands are thickened with low attenuation consistent with hyperplasia or adenomas. There is a low-density cyst within the left kidney. No hydronephrosis. There is a coarse calcification in the right kidney measuring 6 mm.  The stomach, small bowel, cecum are normal. The colon is normal. There is a large stool ball in the rectum measuring 8.7 cm diameter.  The abdominal aorta is calcified but not aneurysmal. No free fluid the pelvis. Post hysterectomy. Bladder is normal.  Degenerative osteophytosis of the thoracic spine. Anterolisthesis of L4 on L5.  IMPRESSION: 1. Moderate to large acute rectus sheath hematoma on the left with a calculated volume of 400 cubic cm. 2. A mildly distended gallbladder with dependent gallstones and sludge. 3. Small bilateral pleural effusions small amount of intraperitoneal 4. Large stool ball in the rectum. Findings conveyed topatient's nurse Davonna Belling 05/11/2013 at18:04.   Electronically Signed   By: Genevive Bi M.D.   On: 05/11/2013 18:05   Dg Chest 2 View  05/10/2013   CLINICAL DATA:  79 year old female altered mental status and weakness. Initial encounter.  EXAM: CHEST  2 VIEW  COMPARISON:  04/21/2013 and earlier.  FINDINGS: Semi upright AP and lateral views of the chest. New small bilateral pleural effusions. Increased bibasilar opacity most resembling atelectasis. Stable cardiomegaly and mediastinal  contours. Pulmonary vascular congestion without overt edema. Stable visualized osseous structures.  IMPRESSION: Increased vascular congestion without overt edema. New small bilateral pleural effusions and bibasilar atelectasis. Stable cardiomegaly.   Electronically Signed   By: Augusto Gamble M.D.   On: 05/10/2013 16:03  Dg Tibia/fibula Left  04/21/2013   CLINICAL DATA:  Osteomyelitis.  EXAM: LEFT TIBIA AND FIBULA - 2 VIEW  COMPARISON:  01/26/2013.  FINDINGS: No evidence of osteomyelitis. Osteopenia. Diabetic type small vessel atherosclerosis calcifications. Calcifications in the subcutaneous tissues similar to the contralateral side. No focal areas of skin ulceration.  IMPRESSION: No radiographic evidence of osteomyelitis.   Electronically Signed   By: Andreas Newport M.D.   On: 04/21/2013 23:07   Dg Tibia/fibula Right  04/21/2013   CLINICAL DATA:  Leg pain. Osteomyelitis.  EXAM: RIGHT TIBIA AND FIBULA - 2 VIEW  COMPARISON:  01/26/2013 radiographs.  FINDINGS: Severe diabetic type small vessel atherosclerosis. Calcifications an these subcutaneous tissues may relate to chronic venous insufficiency, dystrophic calcifications, prior trauma or less likely scleroderma. Diffuse osteopenia. There are no areas of osteolysis. No focal soft tissue ulcerations are identified extending to bone. No gas in the soft tissues.  IMPRESSION: No evidence of osteomyelitis.   Electronically Signed   By: Andreas Newport M.D.   On: 04/21/2013 23:06   Ct Head Wo Contrast  05/10/2013   CLINICAL DATA:  79 year old female with weakness. Altered mental status. Initial encounter.  EXAM: CT HEAD WITHOUT CONTRAST  TECHNIQUE: Contiguous axial images were obtained from the base of the skull through the vertex without intravenous contrast.  COMPARISON:  Brain MRI 04/23/2013 and earlier.  FINDINGS: Visualized paranasal sinuses and mastoids are clear. Calcified atherosclerosis at the skull base. Stable orbit and scalp soft tissues. Stable  visualized osseous structures.  Stable cerebral volume. No ventriculomegaly. Patchy in confluent cerebral white matter hypodensity is stable. Chronic lacunar infarcts in the bilateral deep gray matter nuclei (left lentiform and right thalamus) appear stable. No midline shift, mass effect, or evidence of intracranial mass lesion. No suspicious intracranial vascular hyperdensity. No acute intracranial hemorrhage identified. No evidence of cortically based acute infarction identified.  IMPRESSION: Stable chronic small vessel ischemia. No acute intracranial abnormality.   Electronically Signed   By: Augusto Gamble M.D.   On: 05/10/2013 16:27   Mr Maxine Glenn Head Wo Contrast  04/24/2013   CLINICAL DATA:  Failure to thrive.  Stroke.  EXAM: MRI HEAD WITHOUT CONTRAST  MRA HEAD WITHOUT CONTRAST  TECHNIQUE: Multiplanar, multiecho pulse sequences of the brain and surrounding structures were obtained without intravenous contrast. Angiographic images of the head were obtained using MRA technique without contrast.  COMPARISON:  CT head without contrast 04/05/2013.  FINDINGS: MRI HEAD FINDINGS  The diffusion-weighted images demonstrate no evidence for acute or subacute infarction. Moderate generalized atrophy and diffuse white matter disease is present. Remote lacunar infarcts are evident within the basilar ganglia bilaterally.  Flow is present in the major intracranial arteries. 3 or 4 areas of punctate susceptibility are noted, compatible with remote punctate hemorrhage. This raises the possibility of vasculitis.  No acute hemorrhage or mass lesion is present. The globes and orbits are intact. Mild mucosal thickening is present in the ethmoid air cells. The remaining paranasal sinuses and the mastoid air cells are clear. Degenerative grade 1 retrolisthesis is present at C2-3 and C3-4. Chronic endplate changes are present at C2-3.  MRA HEAD FINDINGS  The study is markedly degraded by patient motion. This sequence is particularly  sensitive to motion. This significantly degrades the study. Asymmetric signal loss is present in the distal cavernous and supra clinoid left internal carotid artery. This may represent significant stenosis. The A1 and M1 segments appear to be intact. There is significant attenuation of branch vessels.  The left vertebral  artery is slightly dominant to the right. There is some narrowing of the distal right vertebral artery. The basilar artery is intact. The right posterior cerebral artery appears to be a fetal type. There is likely a small right P1 segment. There is significant signal loss in the distal branch vessels.  IMPRESSION: MRI HEAD IMPRESSION  1. No acute intracranial abnormality. 2. Atrophy and diffuse white matter disease. This likely reflects the sequelae of chronic microvascular ischemia. 3. Punctate areas of remote hemorrhage are concerning for amyloid angiography or other chronic vasculitis. 4. Marked degenerative changes within the upper cervical spine.  MRA HEAD IMPRESSION  1. The study is significantly degraded by patient motion, degrading evaluation of media min small vessels. 2. Probable narrowing of the distal cavernous and supra clinoid left internal carotid artery. 3. Fetal type right posterior cerebral artery.   Electronically Signed   By: Gennette Pac M.D.   On: 04/24/2013 08:35   Mr Brain Wo Contrast  04/24/2013   CLINICAL DATA:  Failure to thrive.  Stroke.  EXAM: MRI HEAD WITHOUT CONTRAST  MRA HEAD WITHOUT CONTRAST  TECHNIQUE: Multiplanar, multiecho pulse sequences of the brain and surrounding structures were obtained without intravenous contrast. Angiographic images of the head were obtained using MRA technique without contrast.  COMPARISON:  CT head without contrast 04/05/2013.  FINDINGS: MRI HEAD FINDINGS  The diffusion-weighted images demonstrate no evidence for acute or subacute infarction. Moderate generalized atrophy and diffuse white matter disease is present. Remote lacunar  infarcts are evident within the basilar ganglia bilaterally.  Flow is present in the major intracranial arteries. 3 or 4 areas of punctate susceptibility are noted, compatible with remote punctate hemorrhage. This raises the possibility of vasculitis.  No acute hemorrhage or mass lesion is present. The globes and orbits are intact. Mild mucosal thickening is present in the ethmoid air cells. The remaining paranasal sinuses and the mastoid air cells are clear. Degenerative grade 1 retrolisthesis is present at C2-3 and C3-4. Chronic endplate changes are present at C2-3.  MRA HEAD FINDINGS  The study is markedly degraded by patient motion. This sequence is particularly sensitive to motion. This significantly degrades the study. Asymmetric signal loss is present in the distal cavernous and supra clinoid left internal carotid artery. This may represent significant stenosis. The A1 and M1 segments appear to be intact. There is significant attenuation of branch vessels.  The left vertebral artery is slightly dominant to the right. There is some narrowing of the distal right vertebral artery. The basilar artery is intact. The right posterior cerebral artery appears to be a fetal type. There is likely a small right P1 segment. There is significant signal loss in the distal branch vessels.  IMPRESSION: MRI HEAD IMPRESSION  1. No acute intracranial abnormality. 2. Atrophy and diffuse white matter disease. This likely reflects the sequelae of chronic microvascular ischemia. 3. Punctate areas of remote hemorrhage are concerning for amyloid angiography or other chronic vasculitis. 4. Marked degenerative changes within the upper cervical spine.  MRA HEAD IMPRESSION  1. The study is significantly degraded by patient motion, degrading evaluation of media min small vessels. 2. Probable narrowing of the distal cavernous and supra clinoid left internal carotid artery. 3. Fetal type right posterior cerebral artery.   Electronically  Signed   By: Gennette Pac M.D.   On: 04/24/2013 08:35   Dg Chest Portable 1 View  04/21/2013   CLINICAL DATA:  Fever and weakness.  EXAM: PORTABLE CHEST - 1 VIEW  COMPARISON:  01/26/2013  FINDINGS: There is stable cardiomegaly. Mild atelectasis present at the right base. There is no evidence of pulmonary edema, consolidation, pneumothorax, nodule or pleural fluid. The visualized skeletal structures are unremarkable.  IMPRESSION: No active disease.   Electronically Signed   By: Irish Lack M.D.   On: 04/21/2013 12:49    Microbiology: Recent Results (from the past 240 hour(s))  MRSA PCR SCREENING     Status: None   Collection Time    05/10/13  9:10 PM      Result Value Range Status   MRSA by PCR NEGATIVE  NEGATIVE Final   Comment:            The GeneXpert MRSA Assay (FDA     approved for NASAL specimens     only), is one component of a     comprehensive MRSA colonization     surveillance program. It is not     intended to diagnose MRSA     infection nor to guide or     monitor treatment for     MRSA infections.     Labs: Basic Metabolic Panel:  Recent Labs Lab 05/10/13 1450 05/12/13 0200 05/14/13 0500  NA 140 136 141  K 3.9 3.5 3.3*  CL 105 102 105  CO2 26 28 28   GLUCOSE 169* 149* 120*  BUN 50* 45* 32*  CREATININE 1.56* 1.51* 1.33*  CALCIUM 9.1 8.8 9.3   Liver Function Tests:  Recent Labs Lab 05/12/13 0200  AST 18  ALT 19  ALKPHOS 115  BILITOT 1.0  PROT 5.2*  ALBUMIN 2.5*   No results found for this basename: LIPASE, AMYLASE,  in the last 168 hours No results found for this basename: AMMONIA,  in the last 168 hours CBC:  Recent Labs Lab 05/10/13 1450  05/12/13 1004 05/12/13 1757 05/13/13 0145 05/13/13 1016 05/14/13 0500  WBC 7.8  --   --   --   --   --  6.6  HGB 6.0*  < > 9.3* 8.7* 8.9* 9.2* 9.1*  HCT 19.4*  < > 29.7* 27.6* 27.9* 28.9* 28.5*  MCV 99.5  --   --   --   --   --  96.3  PLT 324  --   --   --   --   --  250  < > = values in this  interval not displayed. Cardiac Enzymes: No results found for this basename: CKTOTAL, CKMB, CKMBINDEX, TROPONINI,  in the last 168 hours BNP: BNP (last 3 results)  Recent Labs  01/26/13 1659 01/29/13 0530 05/10/13 1525  PROBNP 3453.0* 3513.0* 3105.0*   CBG:  Recent Labs Lab 05/13/13 1152 05/13/13 1749 05/13/13 2102 05/14/13 0745 05/14/13 1138  GLUCAP 111* 185* 140* 105* 150*       Signed:  Emara Lichter  [facility] 05/14/2013, 2:32 PM

## 2013-05-15 ENCOUNTER — Other Ambulatory Visit: Payer: Self-pay | Admitting: *Deleted

## 2013-05-15 MED ORDER — HYDROCODONE-ACETAMINOPHEN 7.5-325 MG PO TABS: 1.0000 | ORAL_TABLET | Freq: Four times a day (QID) | ORAL | Status: DC | PRN

## 2013-05-15 MED ORDER — HYDROCODONE-ACETAMINOPHEN 7.5-325 MG PO TABS: ORAL_TABLET | ORAL | Status: DC

## 2013-05-16 ENCOUNTER — Other Ambulatory Visit: Payer: Self-pay | Admitting: *Deleted

## 2013-05-16 MED ORDER — HYDROCODONE-ACETAMINOPHEN 7.5-325 MG PO TABS: ORAL_TABLET | ORAL | Status: DC

## 2013-05-17 ENCOUNTER — Non-Acute Institutional Stay (SKILLED_NURSING_FACILITY): Payer: Medicare Other | Admitting: Internal Medicine

## 2013-05-17 DIAGNOSIS — IMO0002 Reserved for concepts with insufficient information to code with codable children: Secondary | ICD-10-CM

## 2013-05-17 DIAGNOSIS — S301XXS Contusion of abdominal wall, sequela: Secondary | ICD-10-CM

## 2013-05-17 DIAGNOSIS — D62 Acute posthemorrhagic anemia: Secondary | ICD-10-CM

## 2013-05-17 DIAGNOSIS — I509 Heart failure, unspecified: Secondary | ICD-10-CM

## 2013-05-17 DIAGNOSIS — E1059 Type 1 diabetes mellitus with other circulatory complications: Secondary | ICD-10-CM

## 2013-05-22 ENCOUNTER — Non-Acute Institutional Stay (SKILLED_NURSING_FACILITY): Payer: Medicare Other | Admitting: Internal Medicine

## 2013-05-22 DIAGNOSIS — R112 Nausea with vomiting, unspecified: Secondary | ICD-10-CM

## 2013-05-22 DIAGNOSIS — D631 Anemia in chronic kidney disease: Secondary | ICD-10-CM

## 2013-05-25 ENCOUNTER — Non-Acute Institutional Stay (SKILLED_NURSING_FACILITY): Payer: Medicare Other | Admitting: Internal Medicine

## 2013-05-25 DIAGNOSIS — S81001A Unspecified open wound, right knee, initial encounter: Secondary | ICD-10-CM

## 2013-05-25 DIAGNOSIS — IMO0002 Reserved for concepts with insufficient information to code with codable children: Secondary | ICD-10-CM

## 2013-05-25 DIAGNOSIS — S301XXS Contusion of abdominal wall, sequela: Secondary | ICD-10-CM

## 2013-05-25 DIAGNOSIS — K5641 Fecal impaction: Secondary | ICD-10-CM

## 2013-05-25 DIAGNOSIS — S81009A Unspecified open wound, unspecified knee, initial encounter: Secondary | ICD-10-CM

## 2013-05-25 DIAGNOSIS — I872 Venous insufficiency (chronic) (peripheral): Secondary | ICD-10-CM

## 2013-05-25 NOTE — Progress Notes (Signed)
Patient ID: [name], female   DOB: [birth year], 79 y.o.   MRN: [id number] Facility; [facility]; Chief complaint; wound review History; this is a patient that I saw on 2 prior occasions for severe bilateral right greater than left venous stasis ulcerations. I last saw this woman on November 4 I did a fairly extensive debridement on these wounds for a tight adherent eschar. Shortly after this so she was admitted to hospital with a very significant the rest left-sided rectus sheath hematoma occurring in the setting of chronic Coumadin which has since been stopped. We have been using Santyl and Medihoney Vaseline gauze under a Kerlix Coban wrap  Physical examination; Gen. the patient does not appear to be in any distress Abdomen mass in the left mid to lower quadrant which im assuming is the hematoma. This is nonpulsatile and not particularly the tender. Her abdomen is somewhat distended however there is no guarding or rebound bowel sounds are positive  rectal; moderate amount of formed stool Extremities; edema is under good control. The wounds are considerably better than when I last saw the use 3 weeks ago. Healthy granulation advancing epithelialization. There is still a extensive area of open wound on the right leg although this is also considerably better there is no reason to consider changing the treatment orders  Impression/plan #1 severe bilateral venous stasis ulcerations. These are much better than last time I saw these and there is no reason to consider changing the current treatment orders #2 significant left rectus sheath hematoma occurring on the background of Coumadin use with an elevated INR. Coumadin is stopped last hemoglobin was stable at 4.9 #3 abdominal distention; headaches she may have some elements of a distal fecal impaction which I will try to address

## 2013-05-28 DIAGNOSIS — I509 Heart failure, unspecified: Secondary | ICD-10-CM | POA: Insufficient documentation

## 2013-05-28 DIAGNOSIS — E1121 Type 2 diabetes mellitus with diabetic nephropathy: Secondary | ICD-10-CM | POA: Insufficient documentation

## 2013-05-28 NOTE — Progress Notes (Signed)
Patient ID: [name], female   DOB: 02/09/1932, 79 y.o.   MRN: [id number]        HISTORY & PHYSICAL  DATE: 04/26/2013   FACILITY: [facility]  LEVEL OF CARE: SNF (31)  ALLERGIES:  Allergies  Allergen Reactions  . Clonidine Derivatives Itching and Rash    Skin breaks out    CHIEF COMPLAINT:  Manage diabetes mellitus, atrial fibrillation, and CHF.    HISTORY OF PRESENT ILLNESS:  The patient is a 79 year-old, African-American female who was hospitalized for functional decline and failure to thrive.  After hospitalization, she is admitted to this facility for short-term rehabilitation.  She has the following problems:    DM:pt's DM remains stable.  Pt denies polyuria, polydipsia, polyphagia, changes in vision or hypoglycemic episodes.  No complications noted from the medication presently being used.  Last hemoglobin A1c is:   Not available.    ATRIAL FIBRILLATION: the patients atrial fibrillation remains stable.  The patient denies DOE, tachycardia, orthopnea, transient neurological sx, pedal edema, palpitations, & PNDs.  No complications noted from the medications currently being used.    CHF:The patient does not relate significant weight changes, denies sob, DOE, orthopnea, PNDs, pedal edema, palpitations or chest pain.  CHF remains stable.  No complications form the medications being used.    PAST MEDICAL HISTORY :  Past Medical History  Diagnosis Date  . Obesity   . Anemia, iron deficiency   . Depression   . Diabetes mellitus type II   . Hypertension   . Peptic ulcer   . History of uterine fibroid   . Hx of colonic polyps   . Diverticula, colon   . Osteoarthritis   . Low back pain   . Atrial fibrillation     permanent, on coumadin (followed by [facility]in [facility])  . Pulmonary hypertension     echo 4/12:  EF 55-60%, mod LVH, mild BAE, RVE and PASP 51  . Anxiety   . DIABETES MELLITUS, TYPE II 03/05/2007  . HYPERLIPIDEMIA 03/05/2007  . ANEMIA-IRON DEFICIENCY  03/05/2007  . HYPERTENSION 03/05/2007  . DEPRESSION 03/05/2007  . LOW BACK PAIN 03/05/2007  . PERIPHERAL EDEMA 04/29/2008  . Chronic pain syndrome 06/23/2010  . FIBROIDS, UTERUS 03/05/2007  . PEPTIC ULCER DISEASE 03/05/2007  . DIVERTICULOSIS, COLON 03/05/2007  . OSTEOARTHRITIS 03/05/2007  . COLONIC POLYPS, HX OF 03/05/2007  . Hypothyroidism 10/15/2010  . Renal insufficiency 10/15/2010  . Atrial fibrillation   . Anemia of chronic disease 05/11/2013    PAST SURGICAL HISTORY: Past Surgical History  Procedure Laterality Date  . Laparoscopic hysterectomy    . Colonoscopy  2009    SOCIAL HISTORY:  reports that she quit smoking about 40 years ago. She started smoking about 41 years ago. She has never used smokeless tobacco. She reports that she does not drink alcohol or use illicit drugs.  FAMILY HISTORY:  Family History  Problem Relation Age of Onset  . Hypertension Sister     CURRENT MEDICATIONS: Reviewed per [facility]  REVIEW OF SYSTEMS:  See HPI otherwise 14 point ROS is negative.  PHYSICAL EXAMINATION  VS:  T 99.2       P 77      RR 22      BP 136/48      POX 98%        WT (Lb) 183    GENERAL: no acute distress, normal body habitus EYES: conjunctivae normal, sclerae normal, normal eye lids MOUTH/THROAT:  lips without lesions,no lesions in the mouth,tongue is without lesions,uvula elevates in midline NECK: supple, trachea midline, no neck masses, no thyroid tenderness, no thyromegaly LYMPHATICS: no LAN in the neck, no supraclavicular LAN RESPIRATORY: breathing is even & unlabored, BS CTAB CARDIAC: heart rate is irregular irregular, no murmur,no extra heart sounds, no edema GI:  ABDOMEN: abdomen soft, normal BS, no masses, no tenderness  LIVER/SPLEEN: no hepatomegaly, no splenomegaly MUSCULOSKELETAL: HEAD: normal to inspection & palpation BACK: no kyphosis, scoliosis or spinal processes tenderness EXTREMITIES: LEFT UPPER EXTREMITY: strength decreased, range of motion moderate   RIGHT UPPER  EXTREMITY: strength less than left upper extremity strength, range of motion moderate     LEFT LOWER EXTREMITY: unable to assess, extremity is wrapped   RIGHT LOWER EXTREMITY: unable to assess, extremity is wrapped   PSYCHIATRIC: the patient is alert & oriented to person, affect & behavior appropriate  LABS/RADIOLOGY: Left tibia/fibula x-ray:  Did not show osteomyelitis.    Right tibia/fibula x-ray:   Did not show osteomyelitis.    CT of the head:  No acute findings.    CT of the cervical spine:  No acute findings.    MRI of the head:  No acute findings.    MRA of the head:  No acute findings.    Chest x-ray:  No acute disease.    Urine culture showed no growth.    Blood culture x2 showed no growth.    Glucose 205, CO2 17, potassium 3.4, creatinine 1.67, otherwise BMP normal.    Hemoglobin 8.9, MCV 91.5, otherwise CBC normal.    ASSESSMENT/PLAN:  Diabetes mellitus with renal complications.  Continue current medications.    Atrial fibrillation.  Rate controlled.    CHF.  Adequately compensated.    Chronic kidney disease stage III.  Reassess renal functions.    Chronic venous insufficiency.  Continue Unna wraps.    Bilateral lower extremity cellulitis.  Continue doxycycline as prescribed.    Check CBC and BMP.    I have reviewed patient's medical records received at admission/from hospitalization.  CPT CODE: 16109

## 2013-05-31 ENCOUNTER — Non-Acute Institutional Stay (SKILLED_NURSING_FACILITY): Payer: Medicare Other | Admitting: Internal Medicine

## 2013-05-31 DIAGNOSIS — I482 Chronic atrial fibrillation, unspecified: Secondary | ICD-10-CM

## 2013-05-31 DIAGNOSIS — F329 Major depressive disorder, single episode, unspecified: Secondary | ICD-10-CM

## 2013-05-31 DIAGNOSIS — M25569 Pain in unspecified knee: Secondary | ICD-10-CM

## 2013-05-31 DIAGNOSIS — D631 Anemia in chronic kidney disease: Secondary | ICD-10-CM

## 2013-05-31 DIAGNOSIS — I4891 Unspecified atrial fibrillation: Secondary | ICD-10-CM

## 2013-06-04 ENCOUNTER — Other Ambulatory Visit: Payer: Self-pay | Admitting: Internal Medicine

## 2013-06-04 DIAGNOSIS — D631 Anemia in chronic kidney disease: Secondary | ICD-10-CM | POA: Insufficient documentation

## 2013-06-04 NOTE — Progress Notes (Signed)
Patient ID: [name], female   DOB: 10-31-1931, 79 y.o.   MRN: [id number]        PROGRESS NOTE  DATE: 05/03/2013    FACILITY:  [facility]  LEVEL OF CARE: SNF (31)  Acute Visit  CHIEF COMPLAINT:  Manage anemia of chronic kidney disease, lower extremity pain, and chronic kidney disease stage II.    HISTORY OF PRESENT ILLNESS: I was requested by the staff to assess the patient regarding above problem(s):  ANEMIA: The anemia is unstable. The patient denies fatigue, melena or hematochezia. No complications from the medications currently being used.   On 04/27/2013:  Hemoglobin 8, MCV 92.  On 04/25/2013:  Hemoglobin 8.9.    LOWER EXTREMITY PAIN:  Uncontrolled problem.  Per treatment nurse, patient is receiving oxycodone 5 mg q.4 p.r.n., which is not sufficient at treatment.    CHRONIC KIDNEY DISEASE: The patient's chronic kidney disease remains stable.  Patient denies increasing lower extremity swelling or confusion. Last BUN and creatinine are:   On 04/27/2013:   BUN 33, creatinine 1.51.  On 04/25/2013:  BUN 37, creatinine 1.67.   Patient is on Lasix.    PAST MEDICAL HISTORY : Reviewed.  No changes.  CURRENT MEDICATIONS: Reviewed per [facility]  REVIEW OF SYSTEMS:  Difficult to obtain.  Patient overall is a poor historian.    PHYSICAL EXAMINATION  GENERAL: no acute distress, normal body habitus EYES: conjunctivae normal, sclerae normal, normal eye lids NECK: supple, trachea midline, no neck masses, no thyroid tenderness, no thyromegaly LYMPHATICS: no LAN in the neck, no supraclavicular LAN RESPIRATORY: breathing is even & unlabored, BS CTAB CARDIAC: RRR, no murmur,no extra heart sounds EDEMA/VARICOSITIES/ARTERIAL: bilateral lower extremities wrapped   GI: abdomen soft, normal BS, no masses, no tenderness, no hepatomegaly, no splenomegaly PSYCHIATRIC: the patient is alert & oriented to person, affect & behavior appropriate  ASSESSMENT/PLAN:  Anemia of chronic kidney  disease.  Unstable problem.  Hemoglobin declined.  We will reassess.    Lower extremity pain.  Uncontrolled problem.   We will give oxycodone 10 mg one hour prior to treatment.    Chronic kidney disease stage II.  Renal functions improved.  We will monitor.    CPT CODE: 40981

## 2013-06-04 NOTE — Telephone Encounter (Signed)
Not listed on pt's current med list. Ok to refill?

## 2013-06-05 NOTE — Telephone Encounter (Signed)
Called the patient to inform unable to contact

## 2013-06-05 NOTE — Telephone Encounter (Signed)
To hold on metformin for now; was not addressed as a med to stop at last d/c summary, but I suspect this was intended due to renal fxn; ok to follow for now until next OV

## 2013-06-07 ENCOUNTER — Non-Acute Institutional Stay (SKILLED_NURSING_FACILITY): Payer: Medicare Other | Admitting: Internal Medicine

## 2013-06-07 DIAGNOSIS — I10 Essential (primary) hypertension: Secondary | ICD-10-CM

## 2013-06-07 DIAGNOSIS — I509 Heart failure, unspecified: Secondary | ICD-10-CM

## 2013-06-07 DIAGNOSIS — E039 Hypothyroidism, unspecified: Secondary | ICD-10-CM

## 2013-06-07 DIAGNOSIS — E1059 Type 1 diabetes mellitus with other circulatory complications: Secondary | ICD-10-CM

## 2013-06-09 ENCOUNTER — Encounter: Payer: Self-pay | Admitting: Internal Medicine

## 2013-06-09 NOTE — Progress Notes (Signed)
            PROGRESS NOTE  DATE: 06/07/2013  FACILITY: Nursing Home Location: [facility]  LEVEL OF CARE: SNF (31)  Routine Visit  CHIEF COMPLAINT:  Manage DM, hypothyroidism & HTN  HISTORY OF PRESENT ILLNESS:  REASSESSMENT OF ONGOING PROBLEM(S):   DM:pt's DM remains stable.  Pt denies polyuria, polydipsia, polyphagia, changes in vision or hypoglycemic episodes.  No complications noted from the medication presently being used.  Last hemoglobin A1c is: not available.  HTN: Pt 's HTN remains stable.  Denies CP, sob, DOE, headaches, dizziness or visual disturbances.  No complications from the medications currently being used.  Last BP :136/88.  PAST MEDICAL HISTORY : Reviewed.  No changes.  CURRENT MEDICATIONS: Reviewed per [facility]  REVIEW OF SYSTEMS:  GENERAL: no change in appetite, no fatigue, no weight changes, no fever, chills or weakness RESPIRATORY: no cough, SOB, DOE, wheezing, hemoptysis CARDIAC: no chest pain, or palpitations, c/o LE swelling GI: no abdominal pain, diarrhea, constipation, heart burn, nausea or vomiting  PHYSICAL EXAMINATION  VS:  T 97.6      P 100      RR 18   BP 136/88     POX %     WT (Lb) 179  GENERAL: no acute distress, morbidly obese body habitus EYES: conjunctivae normal, sclerae normal, normal eye lids NECK: supple, trachea midline, no neck masses, no thyroid tenderness, no thyromegaly LYMPHATICS: no LAN in the neck, no supraclavicular LAN RESPIRATORY: breathing is even & unlabored, BS CTAB CARDIAC: heart rate is irregularly irregular, no murmur,no extra heart sounds, RUE +2 edema, BLE wrapped GI: abdomen soft, normal BS, no masses, no tenderness, no hepatomegaly, no splenomegaly PSYCHIATRIC: the patient is alert & oriented to person, affect & behavior appropriate  LABS/RADIOLOGY:  12-14 Hb 9.3, mcv 95 ow cbc nl 11-14 glc 219, cr 1.15 ow bmp nl, FLP nl  ASSESSMENT/PLAN:  DM-check HbA1c HTN-well  controlled Hypothyroidism-well controlled. CHF-well compensated Anemia of chronic dz- stable Hyperlipidemia-well controlled. LE pain-well controlled. Atrial fibrillation-rate controlled. Depression-cont. celexa.  CPT CODE: 81191

## 2013-06-11 ENCOUNTER — Encounter: Payer: Medicare Other | Admitting: Physician Assistant

## 2013-06-13 ENCOUNTER — Encounter: Payer: Medicare Other | Admitting: Physician Assistant

## 2013-06-13 NOTE — Progress Notes (Deleted)
[address], [address] [place], [place]  [address] Phone: [phone] Fax:  [phone]  Date:  06/13/2013   ID:  [name], [name] 02-03-1932, MRN [id number]  PCP:  [name] Barre, MD  Cardiologist:  Dr. Rollene Rotunda     History of Present Illness: [name] is a 79 y.o. female with a hx of AFib, pulmonary HTN, R sided heart failure, DM2, HTN, HL and CKD.  Echo 09/2010: EF 55-60%, mod LVH, mild BAE, RVE and PASP 51.   Last seen by Dr. Rollene Rotunda 02/2012.    She was admitted 11/13-11/17 with profound anemia (hemoglobin 6) in the setting of left rectus sheath hematoma. Her INR was as high as 4.46 during that admission. She was transfused with PRBCs and also received FFP to reverse her Coumadin.  She was seen by both gastroenterology and general surgery. Gen. Surgery did make recommendation of not restarting her Coumadin for one to 2 weeks after discharge.  Of note, the patient had been noted in 01/2013 for lower extremity cellulitis and again in 03/2013 for failure to thrive.  ***  Abdominal and pelvic CT (05/11/2013): IMPRESSION: 1. Moderate to large acute rectus sheath hematoma on the left with a calculated volume of 400 cubic cm. 2. A mildly distended gallbladder with dependent gallstones and sludge. 3. Small bilateral pleural effusions small amount of intraperitoneal 4. Large stool ball in the rectum.    Recent Labs: 07/18/2012: HDL 61.80; LDL (calc) 59  84/16/6063: TSH 1.316  05/10/2013: Pro B Natriuretic peptide (BNP) 3105.0*  05/12/2013: ALT 19  05/14/2013: Creatinine 1.33*; Hemoglobin 9.1*; Potassium 3.3*   Wt Readings from Last 3 Encounters:  05/14/13 186 lb 11.7 oz (84.7 kg)  04/25/13 184 lb 1.4 oz (83.5 kg)  04/05/13 178 lb (80.74 kg)     Past Medical History  Diagnosis Date  . Obesity   . Anemia, iron deficiency   . Depression   . Diabetes mellitus type II   . Hypertension   . Peptic ulcer   . History of uterine fibroid   . Hx of colonic polyps     . Diverticula, colon   . Osteoarthritis   . Low back pain   . Atrial fibrillation     permanent, on coumadin (followed by [facility] coumadin clinic)  . Pulmonary hypertension     echo 4/12:  EF 55-60%, mod LVH, mild BAE, RVE and PASP 51  . Anxiety   . DIABETES MELLITUS, TYPE II 03/05/2007  . HYPERLIPIDEMIA 03/05/2007  . ANEMIA-IRON DEFICIENCY 03/05/2007  . HYPERTENSION 03/05/2007  . DEPRESSION 03/05/2007  . LOW BACK PAIN 03/05/2007  . PERIPHERAL EDEMA 04/29/2008  . Chronic pain syndrome 06/23/2010  . FIBROIDS, UTERUS 03/05/2007  . PEPTIC ULCER DISEASE 03/05/2007  . DIVERTICULOSIS, COLON 03/05/2007  . OSTEOARTHRITIS 03/05/2007  . COLONIC POLYPS, HX OF 03/05/2007  . Hypothyroidism 10/15/2010  . Renal insufficiency 10/15/2010  . Atrial fibrillation   . Anemia of chronic disease 05/11/2013    Current Outpatient Prescriptions  Medication Sig Dispense Refill  . ALPRAZolam (XANAX) 0.25 MG tablet Take 1 tablet (0.25 mg total) by mouth 2 (two) times daily as needed. For anxiety  60 tablet  5  . atorvastatin (LIPITOR) 20 MG tablet Take 1 tablet (20 mg total) by mouth daily at 6 PM.  30 tablet  0  . Cholecalciferol (VITAMIN D) 1000 UNITS capsule Take 1,000 Units by mouth daily.       . collagenase (SANTYL) ointment Apply topically daily.  15 g  0  . diltiazem (CARDIZEM CD) 120 MG 24 hr capsule Take 120 mg by mouth daily.      Marland Kitchen docusate sodium (COLACE) 100 MG capsule Take 100 mg by mouth 2 (two) times daily as needed. For constipation       . feeding supplement, ENSURE, (ENSURE) PUDG Take 1 Container by mouth daily.    0  . feeding supplement, RESOURCE BREEZE, (RESOURCE BREEZE) LIQD Take 1 Container by mouth daily.    0  . ferrous gluconate (FERGON) 325 MG tablet Take 325 mg by mouth daily with breakfast.      . furosemide (LASIX) 40 MG tablet Take 0.5 tablets (20 mg total) by mouth daily.  90 tablet  3  . hydrALAZINE (APRESOLINE) 50 MG tablet Take 50 mg by mouth 3 (three) times daily.      Marland Kitchen  HYDROcodone-acetaminophen (NORCO) 7.5-325 MG per tablet Take one tablet by mouth every 6 hours as needed for pain  120 tablet  0  . labetalol (NORMODYNE) 300 MG tablet Take 300 mg by mouth 2 (two) times daily.      Marland Kitchen levothyroxine (SYNTHROID, LEVOTHROID) 25 MCG tablet Take 25 mcg by mouth daily before breakfast.      . Multiple Vitamins-Minerals (MULTIVITAMIN WITH MINERALS) tablet Take 1 tablet by mouth daily.      . ondansetron (ZOFRAN) 4 MG tablet Take 1 tablet (4 mg total) by mouth every 8 (eight) hours as needed for nausea.  30 tablet  1  . sodium bicarbonate 650 MG tablet Take 1 tablet (650 mg total) by mouth 3 (three) times daily.      Marland Kitchen zinc sulfate 220 MG capsule Take 220 mg by mouth daily.       No current facility-administered medications for this visit.    Allergies:   Clonidine derivatives   Social History:  The patient  reports that she quit smoking about 40 years ago. She started smoking about 41 years ago. She has never used smokeless tobacco. She reports that she does not drink alcohol or use illicit drugs.   Family History:  The patient's family history includes Hypertension in her sister.   ROS:  Please see the history of present illness.   ***   All other systems reviewed and negative.   PHYSICAL EXAM: VS:  There were no vitals taken for this visit. Well nourished, well developed, in no acute distress HEENT: normal Neck: no JVD Cardiac:  normal S1, S2; RRR; no murmur Lungs:  clear to auscultation bilaterally, no wheezing, rhonchi or rales Abd: soft, nontender, no hepatomegaly Ext: no edema Skin: warm and dry Neuro:  CNs 2-12 intact, no focal abnormalities noted  EKG:  ***     ASSESSMENT AND PLAN:  1. ***  Signed, Tereso Newcomer, PA-C  06/13/2013 8:35 AM

## 2013-06-13 NOTE — Progress Notes (Signed)
This encounter was created in error - please disregard.

## 2013-06-14 ENCOUNTER — Non-Acute Institutional Stay (SKILLED_NURSING_FACILITY): Payer: Medicare Other | Admitting: Internal Medicine

## 2013-06-14 DIAGNOSIS — R609 Edema, unspecified: Secondary | ICD-10-CM

## 2013-06-15 ENCOUNTER — Encounter: Payer: Self-pay | Admitting: Internal Medicine

## 2013-06-15 ENCOUNTER — Encounter: Payer: Self-pay | Admitting: Physician Assistant

## 2013-06-15 NOTE — Progress Notes (Signed)
Patient ID: [name], female   DOB: 1932-01-01, 79 y.o.   MRN: [id number]        PROGRESS NOTE  DATE: 05/10/2013    FACILITY:  [facility]  LEVEL OF CARE: SNF (31)  Acute Visit  CHIEF COMPLAINT:  Manage severe anemia.    HISTORY OF PRESENT ILLNESS: I was requested by the staff to assess the patient regarding above problem(s):  ANEMIA: The anemia is unstable. The staff denies fatigue, melena or hematochezia. No complications from the medications currently being used.  On 05/07/2013:  Hemoglobin 6.1.  On 04/27/2013:  Hemoglobin 8.    PAST MEDICAL HISTORY : Reviewed.  No changes.  CURRENT MEDICATIONS: Reviewed per [facility]  REVIEW OF SYSTEMS:  Unobtainable.  Patient is a poor historian.      PHYSICAL EXAMINATION  GENERAL: no acute distress, normal body habitus EYES: conjunctivae normal, sclerae normal, normal eye lids NECK: supple, trachea midline, no neck masses, no thyroid tenderness, no thyromegaly LYMPHATICS: no LAN in the neck, no supraclavicular LAN RESPIRATORY: breathing is even & unlabored, BS CTAB CARDIAC: RRR, no murmur,no extra heart sounds, no edema GI: abdomen soft, normal BS, no masses, no tenderness, no hepatomegaly, no splenomegaly PSYCHIATRIC: the patient is alert, unable to assess orientation, affect & behavior appropriate  ASSESSMENT/PLAN: Anemia of chronic kidney disease.  Critically low.  Patient will need transfusion of 2 U of packed red blood cells.    THN Metrics:  Aspirin nonuser due to patient on Coumadin.  Tobacco nonuser.  Blood pressure 130/76.    CPT CODE: 40981

## 2013-06-16 ENCOUNTER — Encounter: Payer: Self-pay | Admitting: Internal Medicine

## 2013-06-16 NOTE — Progress Notes (Signed)
PROGRESS NOTE  DATE: 06/14/2013  FACILITY:  [facility]  LEVEL OF CARE: SNF (31)  Acute Visit  CHIEF COMPLAINT:  Manage extremity swelling  HISTORY OF PRESENT ILLNESS: I was requested by the staff to assess the patient regarding above problem(s):  EDEMA: The patient's edema remains stable. Patient denies increasing lower extremity swelling, calf pain, chest pain or shortness of breath. No complications reported from the medications currently being used. The family requested to assess the patient in due to edema. per staff there have been no increase of swelling.  PAST MEDICAL HISTORY : Reviewed.  No changes.  CURRENT MEDICATIONS: Reviewed per [facility]  PHYSICAL EXAMINATION  GENERAL: no acute distress, morbidly obese body habitus RESPIRATORY: breathing is even & unlabored, BS CTAB CARDIAC: RRR, no murmur,no extra heart sounds, right upper extremity +2 edema, bilateral lower extremities edematous and wrapped  ASSESSMENT/PLAN:  Edema-stable. Right upper extremity has dependent edema. Continue Lasix 20 mg daily.  CPT CODE: 47829

## 2013-06-18 ENCOUNTER — Other Ambulatory Visit: Payer: Self-pay | Admitting: *Deleted

## 2013-06-18 MED ORDER — HYDROCODONE-ACETAMINOPHEN 7.5-325 MG PO TABS: ORAL_TABLET | ORAL | Status: DC

## 2013-06-26 ENCOUNTER — Ambulatory Visit (INDEPENDENT_AMBULATORY_CARE_PROVIDER_SITE_OTHER): Payer: Medicare Other | Admitting: Physician Assistant

## 2013-06-26 ENCOUNTER — Encounter: Payer: Self-pay | Admitting: Physician Assistant

## 2013-06-26 ENCOUNTER — Encounter: Payer: Medicare Other | Admitting: Physician Assistant

## 2013-06-26 VITALS — BP 132/84 | HR 93 | Ht 66.0 in | Wt 183.0 lb

## 2013-06-26 DIAGNOSIS — I4891 Unspecified atrial fibrillation: Secondary | ICD-10-CM

## 2013-06-26 DIAGNOSIS — R609 Edema, unspecified: Secondary | ICD-10-CM

## 2013-06-26 DIAGNOSIS — I50810 Right heart failure, unspecified: Secondary | ICD-10-CM

## 2013-06-26 DIAGNOSIS — I10 Essential (primary) hypertension: Secondary | ICD-10-CM

## 2013-06-26 DIAGNOSIS — I509 Heart failure, unspecified: Secondary | ICD-10-CM

## 2013-06-26 MED ORDER — POTASSIUM CHLORIDE ER 10 MEQ PO TBCR: 10.0000 meq | EXTENDED_RELEASE_TABLET | Freq: Every day | ORAL | Status: DC

## 2013-06-26 MED ORDER — FUROSEMIDE 40 MG PO TABS: ORAL_TABLET | ORAL | Status: DC

## 2013-06-26 NOTE — Progress Notes (Signed)
[address] [place], [place]  [address] Phone: [phone] Fax:  [phone]  Date:  06/26/2013   ID:  [name], [name] [birth year], MRN [id number]  PCP:  Oliver Barre, MD  Cardiologist:  Dr. Rollene Rotunda     History of Present Illness: [name] is a 79 y.o. female with a h/o AFib, pulmonary HTN, right sided heart failure, DM2, HTN, HL and CKD.  Last seen by Dr. Antoine Poche 02/2012.  Echo (04/2013):  Mild LVH, EF 60-65%, mild MAC, mild MR, mod LAE, mild RVE, normal RVF, mod RAE, mod TR, PASP 65.  She has had several admissions over the last year. She was admitted in 01/2013 for lower extremity cellulitis and in 03/2013 for failure to thrive. She was most recently admitted 11/13 at 11/17 with a moderate to large rectus sheath hematoma. She initially presented to the hospital with fatigue and lethargy. Hemoglobin was 6.1. She was transfused with PRBCs. She was given FFP to reverse her Coumadin. She was seen by general surgery.  When last seen by Dr. Abbey Chatters on 05/14/13, he recommended to not restart Coumadin for one to 2 weeks. No surgical intervention was required. She was asked to follow up with cardiology to determine when she should resume Coumadin.  She lives at [facility]  She is here with her transporter today.  She has significant LE edema.  She has significant desquamation of bilateral LEs.  I called her RN and spoke with her by phone.  The patient's legs have been wrapped for some time.  The wraps came off yesterday.  She tells me that the Gustava Berland Keyworth's legs actually look better.  She will continue to have wound management at her facility.  The patient tells me that she his short of breath.  She is basically wheelchair bound. She admits to NYHA Class 3-3b dyspnea.  She admits to orthopnea and PND.  She feels her LE edema is worse.  She denies chest pain or syncope.  She has a non-productive cough.    Recent Labs: 07/18/2012: HDL 61.80; LDL (calc) 59  69/62/9528: TSH  1.316  05/10/2013: Pro B Natriuretic peptide (BNP) 3105.0*  05/12/2013: ALT 19  05/14/2013: Creatinine 1.33*; Hemoglobin 9.1*; Potassium 3.3*   Wt Readings from Last 3 Encounters:  06/26/13 183 lb (83.008 kg)  05/14/13 186 lb 11.7 oz (84.7 kg)  04/25/13 184 lb 1.4 oz (83.5 kg)     Past Medical History  Diagnosis Date  . Obesity   . Anemia, iron deficiency   . Depression   . Diabetes mellitus type II   . Hypertension   . Peptic ulcer   . History of uterine fibroid   . Hx of colonic polyps   . Diverticula, colon   . Osteoarthritis   . Low back pain   . Atrial fibrillation     permanent, on coumadin (followed by [facility])  . Pulmonary hypertension     echo 4/12:  EF 55-60%, mod LVH, mild BAE, RVE and PASP 51  . Anxiety   . DIABETES MELLITUS, TYPE II 03/05/2007  . HYPERLIPIDEMIA 03/05/2007  . ANEMIA-IRON DEFICIENCY 03/05/2007  . HYPERTENSION 03/05/2007  . DEPRESSION 03/05/2007  . LOW BACK PAIN 03/05/2007  . PERIPHERAL EDEMA 04/29/2008  . Chronic pain syndrome 06/23/2010  . FIBROIDS, UTERUS 03/05/2007  . PEPTIC ULCER DISEASE 03/05/2007  . DIVERTICULOSIS, COLON 03/05/2007  . OSTEOARTHRITIS 03/05/2007  . COLONIC POLYPS, HX OF 03/05/2007  . Hypothyroidism 10/15/2010  .  Renal insufficiency 10/15/2010  . Atrial fibrillation   . Anemia of chronic disease 05/11/2013    Current Outpatient Prescriptions  Medication Sig Dispense Refill  . ALPRAZolam (XANAX) 0.25 MG tablet Take 1 tablet (0.25 mg total) by mouth 2 (two) times daily as needed. For anxiety  60 tablet  5  . atorvastatin (LIPITOR) 20 MG tablet Take 1 tablet (20 mg total) by mouth daily at 6 PM.  30 tablet  0  . Cholecalciferol (VITAMIN D) 1000 UNITS capsule Take 1,000 Units by mouth daily.       . collagenase (SANTYL) ointment Apply topically daily.  15 g  0  . diltiazem (CARDIZEM CD) 120 MG 24 hr capsule Take 120 mg by mouth daily.      Marland Kitchen docusate sodium (COLACE) 100 MG capsule Take 100 mg by mouth 2 (two) times daily as  needed. For constipation       . feeding supplement, ENSURE, (ENSURE) PUDG Take 1 Container by mouth 3 (three) times daily between meals.      . ferrous gluconate (FERGON) 325 MG tablet Take 325 mg by mouth daily with breakfast.      . furosemide (LASIX) 40 MG tablet Take 0.5 tablets (20 mg total) by mouth daily.  90 tablet  3  . hydrALAZINE (APRESOLINE) 50 MG tablet Take 50 mg by mouth 3 (three) times daily.      Marland Kitchen HYDROcodone-acetaminophen (NORCO) 7.5-325 MG per tablet Take one tablet by mouth four times daily for pain  120 tablet  0  . labetalol (NORMODYNE) 300 MG tablet Take 300 mg by mouth 2 (two) times daily.      Marland Kitchen levothyroxine (SYNTHROID, LEVOTHROID) 25 MCG tablet Take 25 mcg by mouth daily before breakfast.      . Multiple Vitamins-Minerals (MULTIVITAMIN WITH MINERALS) tablet Take 1 tablet by mouth daily.      . ondansetron (ZOFRAN) 4 MG tablet Take 1 tablet (4 mg total) by mouth every 8 (eight) hours as needed for nausea.  30 tablet  1  . oxyCODONE (OXY IR/ROXICODONE) 5 MG immediate release tablet Take 5 mg by mouth every 4 (four) hours as needed for severe pain (prior to dressing change).      . sodium bicarbonate 650 MG tablet Take 1 tablet (650 mg total) by mouth 3 (three) times daily.      Marland Kitchen zinc sulfate 220 MG capsule Take 220 mg by mouth daily.       No current facility-administered medications for this visit.    Allergies:   Clonidine derivatives   Social History:  The patient  reports that she quit smoking about 40 years ago. She started smoking about 41 years ago. She has never used smokeless tobacco. She reports that she does not drink alcohol or use illicit drugs.   Family History:  The patient's family history includes Hypertension in her sister.   ROS:  Please see the history of present illness.   She notes significant hx of nausea and vomiting.  She denies fevers.  She denies melena or hematochezia.   All other systems reviewed and negative.   PHYSICAL EXAM: VS:  BP  132/84  Pulse 93  Ht 5\' 6"  (1.676 m)  Wt 183 lb (83.008 kg)  BMI 29.55 kg/m2 Well nourished, well developed, in no acute distress HEENT: normal Neck: + JVD at 90 degrees Cardiac:  normal S1, S2; irreg irreg rhythm; no murmur Lungs:  clear to auscultation bilaterally, no wheezing, rhonchi or rales Abd:  distended Ext: massive bilateral LE edema above the knees with bilateral skin desquamation with eschar formation; there is some granulation tissue visible in areas Skin: warm and dry Neuro:  CNs 2-12 intact, no focal abnormalities noted  EKG:  AFib, HR 93, low voltage, RBBB     ASSESSMENT AND PLAN:  1. Right Sided Heart Failure:  She is volume overloaded.  I will increase her Lasix to 60 mg QD x 1 week, then continue with Lasix 40 mg QD.  Add K+ 10 mEq QD.  Check BMET today and repeat in 1 week.   2. Edema:  Her legs are being wrapped at her facility and her wounds are being managed there as well.  Hopefully, diuresis will help improve management.   3. Atrial Fibrillation:  CHADS2-VASc=9.  HAS-BLED=4.  She had a spontaneous rectus sheath hematoma with massive blood loss.  Hgb was 6 upon admission. I am not convinced she is a good candidate to resume coumadin.  She certainly is at increased risk of stroke (annual risk 17.1%).  Annual risk of major bleeding on coumadin is 9.4%.  For now, continue off of coumadin.  I will review further with Dr. Rollene Rotunda.  4. Hypertension:  Controlled. 5. Hyperlipidemia:  Continue statin. 6. Disposition:  F/u with Dr. Rollene Rotunda or me in 2-3 weeks.   Signed, Tereso Newcomer, PA-C  06/26/2013 3:32 PM

## 2013-06-26 NOTE — Patient Instructions (Signed)
START POTASSIUM 10 MEQ DAILY  INCREASE LASIX TO 60 MG DAILY X 1 WEEK THEN DECREASE TO 40 MG DAILY  LAB WORK TODAY; BMET  YOU WILL NEED REPEAT LAB WORK (BMET) IN 1 WEEK TO BE DONE WITH [facility]  YOU HAVE A FOLLOW UP WITH SCOTT WEAVER, [facility] 07/20/13 @ 11:30; SAME DAY DR. Antoine Poche IS IN THE OFFICE

## 2013-06-27 ENCOUNTER — Telehealth: Payer: Self-pay | Admitting: *Deleted

## 2013-06-27 LAB — BASIC METABOLIC PANEL
CO2: 32 mEq/L (ref 19–32)
Calcium: 9.2 mg/dL (ref 8.4–10.5)
GFR: 39.64 mL/min — ABNORMAL LOW (ref >60.00)
Glucose, Bld: 126 mg/dL — ABNORMAL HIGH (ref 70–99)
Sodium: 141 mEq/L (ref 135–145)

## 2013-06-27 NOTE — Telephone Encounter (Signed)
s/w pt's daughter today about lab results and I will fax results to [facility] to Rolly Pancake, RN [phone] fax.

## 2013-07-02 ENCOUNTER — Non-Acute Institutional Stay (SKILLED_NURSING_FACILITY): Payer: Medicare Other | Admitting: Family

## 2013-07-02 ENCOUNTER — Encounter: Payer: Self-pay | Admitting: Family

## 2013-07-02 DIAGNOSIS — I10 Essential (primary) hypertension: Secondary | ICD-10-CM

## 2013-07-02 DIAGNOSIS — E039 Hypothyroidism, unspecified: Secondary | ICD-10-CM

## 2013-07-02 NOTE — Progress Notes (Signed)
Patient ID: [name], female   DOB: February 09, 1932, 80 y.o.   MRN: [id number]  Date: 07/02/13 Facility[facility]    Chief Complaint  Patient presents with  . Medical Managment of Chronic Issues    Routine Visit    HPI: Pt is being followed for the medical management of chronic illnesses. Pt and health care team denies issues/concerns at present.       Allergies  Allergen Reactions  . Clonidine Derivatives Itching and Rash    Skin breaks out     Medication List       This list is accurate as of: 07/02/13  8:22 PM.  Always use your most recent med list.               ALPRAZolam 0.25 MG tablet  Commonly known as:  XANAX  Take 1 tablet (0.25 mg total) by mouth 2 (two) times daily as needed. For anxiety     atorvastatin 20 MG tablet  Commonly known as:  LIPITOR  Take 1 tablet (20 mg total) by mouth daily at 6 PM.     collagenase ointment  Commonly known as:  SANTYL  Apply topically daily.     diltiazem 120 MG 24 hr capsule  Commonly known as:  CARDIZEM CD  Take 120 mg by mouth daily.     docusate sodium 100 MG capsule  Commonly known as:  COLACE  Take 100 mg by mouth 2 (two) times daily as needed. For constipation     feeding supplement (ENSURE) Pudg  Take 1 Container by mouth 3 (three) times daily between meals.     ferrous gluconate 325 MG tablet  Commonly known as:  FERGON  Take 325 mg by mouth daily with breakfast.     furosemide 40 MG tablet  Commonly known as:  LASIX  Take 60 mg daily for 1 week then decrease to 40 mg daily     hydrALAZINE 50 MG tablet  Commonly known as:  APRESOLINE  Take 50 mg by mouth 3 (three) times daily.     HYDROcodone-acetaminophen 7.5-325 MG per tablet  Commonly known as:  NORCO  Take one tablet by mouth four times daily for pain     labetalol 300 MG tablet  Commonly known as:  NORMODYNE  Take 300 mg by mouth 2 (two) times daily.     levothyroxine 25 MCG tablet  Commonly known as:  SYNTHROID, LEVOTHROID  Take  25 mcg by mouth daily before breakfast.     multivitamin with minerals tablet  Take 1 tablet by mouth daily.     ondansetron 4 MG tablet  Commonly known as:  ZOFRAN  Take 1 tablet (4 mg total) by mouth every 8 (eight) hours as needed for nausea.     oxyCODONE 5 MG immediate release tablet  Commonly known as:  Oxy IR/ROXICODONE  Take 5 mg by mouth every 4 (four) hours as needed for severe pain (prior to dressing change).     potassium chloride 10 MEQ tablet  Commonly known as:  K-DUR  Take 1 tablet (10 mEq total) by mouth daily.     sodium bicarbonate 650 MG tablet  Take 1 tablet (650 mg total) by mouth 3 (three) times daily.     Vitamin D 1000 UNITS capsule  Take 1,000 Units by mouth daily.     zinc sulfate 220 MG capsule  Take 220 mg by mouth daily.         DATA  REVIEWED   Laboratory Studies: Reviewed    Past Medical History  Diagnosis Date  . Obesity   . Anemia, iron deficiency   . Depression   . Diabetes mellitus type II   . Hypertension   . Peptic ulcer   . History of uterine fibroid   . Hx of colonic polyps   . Diverticula, colon   . Osteoarthritis   . Low back pain   . Atrial fibrillation     permanent, on coumadin (followed by [facility])  . Pulmonary hypertension     echo 4/12:  EF 55-60%, mod LVH, mild BAE, RVE and PASP 51  . Anxiety   . DIABETES MELLITUS, TYPE II 03/05/2007  . HYPERLIPIDEMIA 03/05/2007  . ANEMIA-IRON DEFICIENCY 03/05/2007  . HYPERTENSION 03/05/2007  . DEPRESSION 03/05/2007  . LOW BACK PAIN 03/05/2007  . PERIPHERAL EDEMA 04/29/2008  . Chronic pain syndrome 06/23/2010  . FIBROIDS, UTERUS 03/05/2007  . PEPTIC ULCER DISEASE 03/05/2007  . DIVERTICULOSIS, COLON 03/05/2007  . OSTEOARTHRITIS 03/05/2007  . COLONIC POLYPS, HX OF 03/05/2007  . Hypothyroidism 10/15/2010  . Renal insufficiency 10/15/2010  . Atrial fibrillation   . Anemia of chronic disease 05/11/2013    Review of Systems  Respiratory: Negative.   Cardiovascular: Negative.         BUE edema  Genitourinary: Negative.   Musculoskeletal: Positive for joint pain.  Neurological: Negative.      Physical Exam Filed Vitals:   07/02/13 2018  BP: 140/88  Pulse: 99  Temp: 97.8 F (36.6 C)  Resp: 18   There is no weight on file to calculate BMI. Physical Exam  Constitutional: She is oriented to person, place, and time.  Cardiovascular: Normal rate and regular rhythm.   Pulmonary/Chest: Effort normal and breath sounds normal.  2 L via [place]  Abdominal: Soft. Bowel sounds are normal.  Genitourinary:  Incontinent brief on  Neurological: She is alert and oriented to person, place, and time.  Skin: Skin is warm and dry.  Psychiatric: She has a normal mood and affect. Her behavior is normal. Judgment and thought content normal.    ASSESSMENT/PLAN  Nutrition consult -for protein supplementation d/t decrease in protein Hypothyroidism-obtain TSH and free t4 HTN-continue with current medication treatment; pt is stable    Follow up:prn

## 2013-07-04 ENCOUNTER — Encounter: Payer: Self-pay | Admitting: Internal Medicine

## 2013-07-04 DIAGNOSIS — D62 Acute posthemorrhagic anemia: Secondary | ICD-10-CM | POA: Insufficient documentation

## 2013-07-04 NOTE — Progress Notes (Signed)
Patient ID: [name], female   DOB: 12-18-1931, 80 y.o.   MRN: [id number]        HISTORY & PHYSICAL  DATE: 05/17/2013     FACILITY: [facility]  LEVEL OF CARE: SNF (31)  ALLERGIES:  Allergies  Allergen Reactions  . Clonidine Derivatives Itching and Rash    Skin breaks out     CHIEF COMPLAINT:  Manage acute blood loss anemia, diabetes mellitus, and CHF.    HISTORY OF PRESENT ILLNESS:  The patient is a 80 year-old, Caucasian female who was hospitalized secondary to increasing lethargy.  After hospitalization, patient is readmitted back to this facility for long-term care management.     ANEMIA:  The patient was diagnosed with a rectal sheath hematoma by CT of the abdomen and pelvis.  Hemoglobin was 6.  Therefore, she was transfused 2 U of packed red blood cells.  Fecal occult blood test was negative.  Ferritin 120.  The anemia has been stable. The patient denies fatigue, melena or hematochezia. No complications from the medications currently being used.  Discharge hemoglobin:  9.1.    DM:pt's DM remains stable.  Pt denies polyuria, polydipsia, polyphagia, changes in vision or hypoglycemic episodes.  No complications noted from the medication presently being used.  Last hemoglobin A1c is:  6.2.    CHF:The patient does not relate significant weight changes, denies sob, DOE, orthopnea, PNDs, pedal edema, palpitations or chest pain.  CHF remains stable.  No complications form the medications being used.  2D-echo showed moderate pulmonary hypertension.    PAST MEDICAL HISTORY :  Past Medical History  Diagnosis Date  . Obesity   . Anemia, iron deficiency   . Depression   . Diabetes mellitus type II   . Hypertension   . Peptic ulcer   . History of uterine fibroid   . Hx of colonic polyps   . Diverticula, colon   . Osteoarthritis   . Low back pain   . Atrial fibrillation     permanent, on coumadin (followed by [facility]in [facility])  . Pulmonary hypertension     echo  4/12:  EF 55-60%, mod LVH, mild BAE, RVE and PASP 51  . Anxiety   . DIABETES MELLITUS, TYPE II 03/05/2007  . HYPERLIPIDEMIA 03/05/2007  . ANEMIA-IRON DEFICIENCY 03/05/2007  . HYPERTENSION 03/05/2007  . DEPRESSION 03/05/2007  . LOW BACK PAIN 03/05/2007  . PERIPHERAL EDEMA 04/29/2008  . Chronic pain syndrome 06/23/2010  . FIBROIDS, UTERUS 03/05/2007  . PEPTIC ULCER DISEASE 03/05/2007  . DIVERTICULOSIS, COLON 03/05/2007  . OSTEOARTHRITIS 03/05/2007  . COLONIC POLYPS, HX OF 03/05/2007  . Hypothyroidism 10/15/2010  . Renal insufficiency 10/15/2010  . Atrial fibrillation   . Anemia of chronic disease 05/11/2013    PAST SURGICAL HISTORY: Past Surgical History  Procedure Laterality Date  . Laparoscopic hysterectomy    . Colonoscopy  2009    SOCIAL HISTORY:  reports that she quit smoking about 40 years ago. She started smoking about 41 years ago. She has never used smokeless tobacco. She reports that she does not drink alcohol or use illicit drugs.  FAMILY HISTORY:  Family History  Problem Relation Age of Onset  . Hypertension Sister     CURRENT MEDICATIONS: Reviewed per [facility]  REVIEW OF SYSTEMS:  Difficult to obtain.  Patient overall is a poor historian.    PHYSICAL EXAMINATION  VS:  T 98.6       P 72      RR  18      BP 127/78      POX 98%         GENERAL: no acute distress, moderately obese body habitus EYES: conjunctivae normal, sclerae normal, normal eye lids MOUTH/THROAT: lips without lesions,no lesions in the mouth,tongue is without lesions,uvula elevates in midline NECK: supple, trachea midline, no neck masses, no thyroid tenderness, no thyromegaly LYMPHATICS: no LAN in the neck, no supraclavicular LAN RESPIRATORY: breathing is even & unlabored, BS CTAB CARDIAC: RRR, no murmur,no extra heart sounds EDEMA/VARICOSITIES: bilateral lower extremities are edematous and wrapped   GI:  ABDOMEN: abdomen soft, normal BS, no masses, no tenderness  LIVER/SPLEEN: no hepatomegaly, no  splenomegaly MUSCULOSKELETAL: HEAD: normal to inspection & palpation BACK: no kyphosis, scoliosis or spinal processes tenderness EXTREMITIES: LEFT UPPER EXTREMITY: strength minimal, range of motion moderate, left greater than right     RIGHT UPPER EXTREMITY: strength minimal, range of motion moderate   LEFT LOWER EXTREMITY: strength decreased, range of motion unable to assess   RIGHT LOWER EXTREMITY: strength decreased, range of motion unable to assess   PSYCHIATRIC: the patient is alert & oriented to person, affect & behavior appropriate  LABS/RADIOLOGY: Left tibia/fibula fracture:  No osteomyelitis.    Right tibia/fibula fracture:  No osteomyelitis.    CT of the head:  No acute findings.    MRA of the head:  No acute findings.     MRI of the brain:  Did not show acute findings.    Chest x-ray:  No acute disease.    MRSA by PCR negative.     TSH 2.11.    Labs reviewed: Basic Metabolic Panel:  Recent Labs  40/81/44 0200 05/14/13 0500 06/26/13 1619  NA 136 141 141  K 3.5 3.3* 4.2  CL 102 105 103  CO2 28 28 32  GLUCOSE 149* 120* 126*  BUN 45* 32* 40*  CREATININE 1.51* 1.33* 1.6*  CALCIUM 8.8 9.3 9.2   Liver Function Tests:  Recent Labs  04/02/13 1205 04/05/13 1619 05/12/13 0200  AST 44* 18 18  ALT 29 21 19   ALKPHOS 92 94 115  BILITOT 0.4 0.3 1.0  PROT 6.9 6.1 5.2*  ALBUMIN 3.1* 3.0* 2.5*   CBC:  Recent Labs  04/02/13 1205  04/05/13 1619  04/21/13 1240  04/25/13 0633 05/10/13 1450  05/13/13 0145 05/13/13 1016 05/14/13 0500  WBC 5.2  --  5.3  --  8.3  < > 9.8 7.8  --   --   --  6.6  NEUTROABS 4.2  --  4.3  --  7.2  --   --   --   --   --   --   --   HGB 10.4*  < > 9.0*  < > 8.8*  < > 8.9* 6.0*  < > 8.9* 9.2* 9.1*  HCT 30.9*  < > 27.5*  < > 27.1*  < > 26.8* 19.4*  < > 27.9* 28.9* 28.5*  MCV 91.4  --  93.5  --  92.2  < > 91.5 99.5  --   --   --  96.3  PLT 248  --  249  --  199  < > 247 324  --   --   --  250  < > = values in this interval not  displayed.  Lipid Panel:  Recent Labs  07/18/12 1136  HDL 61.80   Cardiac Enzymes:  Recent Labs  01/26/13 1659 04/02/13 1205 04/05/13 1619  TROPONINI <0.30 <0.30 <  0.30   CBG:  Recent Labs  05/14/13 0745 05/14/13 1138 05/14/13 1650  GLUCAP 105* 150* 135*    ASSESSMENT/PLAN:  Acute blood loss anemia.  Status post transfusion.  Reassess hemoglobin level.   Continue iron.    Diabetes mellitus.  Well controlled.     CHF.  Well compensated.    Rectal sheath hematoma.  No surgical intervention per General Surgery.    Right lower extremity venous stasis ulcers.  Currently on Unna boots.    Severe protein calorie malnutrition.  Continue dietary supplementation.    Check CBC and BMP.    THN Metrics:   Not on aspirin.  Former smoker.  Blood pressure:  127/78.    I have reviewed patient's medical records received at admission/from hospitalization.  CPT CODE: 96295

## 2013-07-05 ENCOUNTER — Non-Acute Institutional Stay (SKILLED_NURSING_FACILITY): Payer: Medicare Other | Admitting: Internal Medicine

## 2013-07-05 ENCOUNTER — Encounter: Payer: Self-pay | Admitting: Internal Medicine

## 2013-07-05 DIAGNOSIS — R112 Nausea with vomiting, unspecified: Secondary | ICD-10-CM | POA: Insufficient documentation

## 2013-07-05 DIAGNOSIS — D638 Anemia in other chronic diseases classified elsewhere: Secondary | ICD-10-CM

## 2013-07-05 DIAGNOSIS — D696 Thrombocytopenia, unspecified: Secondary | ICD-10-CM

## 2013-07-05 DIAGNOSIS — K5900 Constipation, unspecified: Secondary | ICD-10-CM | POA: Insufficient documentation

## 2013-07-05 NOTE — Progress Notes (Addendum)
Patient ID: [name], female   DOB: February 09, 1932, 80 y.o.   MRN: [id number]          PROGRESS NOTE  DATE: 05/22/2013    FACILITY:  [facility]  LEVEL OF CARE: SNF (31)  Acute Visit  CHIEF COMPLAINT:  Manage CKD 111, anemia of chronic kidney disease, and nausea and vomiting.    HISTORY OF PRESENT ILLNESS: I was requested by the staff to assess the patient regarding above problem(s):  NAUSEA AND VOMITING:  Staff report that patient vomited a moderate amount of undigested food after breakfast.  Afterwards, she also again vomited gray liquids.  She was diaphoretic and clammy.  There was no abdominal pain or diarrhea.  Patient overall is a poor historian.    ANEMIA: The anemia has been stable. The staff denies fatigue, melena or hematochezia. No complications from the medications currently being used.  On 05/18/2013:  Hemoglobin 9.6, MCV 96.  On 05/14/2013:  Hemoglobin 9.1.    CKD 111:  On 05/18/2013:  BUN 22, creatinine 1.15.  On 05/14/2013:  Creatinine 1.33.  Staff deny increasing swelling or confusion.   Patient is on furosemide.    PAST MEDICAL HISTORY : Reviewed.  No changes.  CURRENT MEDICATIONS: Reviewed per [facility]  REVIEW OF SYSTEMS:  Difficult to obtain.  Patient is a poor historian.     PHYSICAL EXAMINATION  VS:  T 98.8       P 78      RR 18      BP 120/70     POX 97%       WT (Lb)  GENERAL: no acute distress, normal body habitus, extremely diaphoretic   EYES: conjunctivae normal, sclerae normal, normal eye lids NECK: supple, trachea midline, no neck masses, no thyroid tenderness, no thyromegaly LYMPHATICS: no LAN in the neck, no supraclavicular LAN RESPIRATORY: breathing is even & unlabored, BS CTAB CARDIAC: RRR, no murmur,no extra heart sounds EDEMA/VARICOSITIES: bilateral lower extremities are edematous and wrapped   GI: abdomen soft, normal BS, no masses, no tenderness, no hepatomegaly, no splenomegaly PSYCHIATRIC: the patient is alert, unable to assess  orientation, decreased affect and mood    ASSESSMENT/PLAN:  Nausea and vomiting.  Acute problem, but exam is benign.   Use p.r.n. Phenergan and monitor.    Anemia of chronic kidney disease.  Hemoglobin improved.   Continue iron.    CKD 111.  Renal functions improved.    THN Metrics:   Not on aspirin.  Former smoker.    CPT CODE: 00349

## 2013-07-05 NOTE — Progress Notes (Signed)
         PROGRESS NOTE  DATE: 07/05/2013  FACILITY:  [facility]  LEVEL OF CARE: SNF (31)  Acute Visit  CHIEF COMPLAINT:  Manage constipation, anemia of chronic disease and thrombocytopenia  HISTORY OF PRESENT ILLNESS: I was requested by the staff to assess the patient regarding above problem(s):  CONSTIPATION: New problem. Staff reports constipation but no abdominal pain, nausea or vomiting. Patient is a poor historian.  ANEMIA: The anemia has been stable. The staff deny fatigue, melena or hematochezia. No complications from the medications currently being used. On 06-28-13 hemoglobin 10, MCV 96. In 12-14 hemoglobin 9.3.   THROMBOCYTOPENIA: New problem. On 06-28-13 platelet 139. In 12-14 platelets 264. Staff do not report easy bleeding or bruising.  PAST MEDICAL HISTORY : Reviewed.  No changes.  CURRENT MEDICATIONS: Reviewed per [facility]  REVIEW OF SYSTEMS: Unobtainable due to patient being a poor historian  PHYSICAL EXAMINATION  GENERAL: no acute distress, morbidly obese body habitus EYES: conjunctivae normal, sclerae normal, normal eye lids NECK: supple, trachea midline, no neck masses, no thyroid tenderness, no thyromegaly LYMPHATICS: no LAN in the neck, no supraclavicular LAN RESPIRATORY: breathing is even & unlabored, BS CTAB CARDIAC: RRR, no murmur,no extra heart sounds, bilateral lower extremities edematous and wrapped GI: abdomen soft, normal BS, no masses, no tenderness, no hepatomegaly, no splenomegaly PSYCHIATRIC: the patient is alert & oriented to person, affect & behavior appropriate  LABS/RADIOLOGY: See history of present illness  ASSESSMENT/PLAN:  Constipation-new problem. Start MiraLax 17 g daily. Anemia of chronic disease-hemoglobin improved. Continue iron. Thrombocytopenia-new problem. Patient asymptomatic. Will monitor.  CPT CODE: 54562

## 2013-07-09 ENCOUNTER — Encounter: Payer: Medicare Other | Admitting: Physician Assistant

## 2013-07-10 ENCOUNTER — Non-Acute Institutional Stay (SKILLED_NURSING_FACILITY): Payer: Medicare Other | Admitting: Internal Medicine

## 2013-07-10 DIAGNOSIS — I509 Heart failure, unspecified: Secondary | ICD-10-CM

## 2013-07-10 DIAGNOSIS — N183 Chronic kidney disease, stage 3 unspecified: Secondary | ICD-10-CM

## 2013-07-10 NOTE — Progress Notes (Signed)
Patient ID: [name], female   DOB: 1931-11-04, 80 y.o.   MRN: [id number] Facility; [facility] Chief complaint; widespread edema History; appears that this patient has been in the building since the end of October 2014. She has a history of atrial fibrillation and diastolic dysfunction. She was recently transferred from one home to another in the current building and facility as noted increasing edema. Of note her weight has remained reasonably stable since her arrival here. On 10/29 airway was 103 on 1/probably was 179 pounds      *[facility] [employer].                     [place], [place] [address]                         [phone]   ------------------------------------------------------------ Transthoracic Echocardiography  Patient:    [name], [name] MR #:       [id number] Study Date: 05/11/2013 Gender:     F Age:        4 Height:     167.6cm Weight:     83.5kg BSA:        1.67m^2 Pt. Status: Room:       [place]    [place], [place]  ADMITTING    Tat, Joan Mayans     Tat, Albina Billet  SONOGRAPHER  Jimmy Reel cc:  ------------------------------------------------------------ LV EF: 60% -   65%  ------------------------------------------------------------ Indications:      Atrial fibrillation - chronic 427.31.  CHF - 428.0.  DM - IDDM - controlled 250.01.  Hypertension - benign 401.1.  Obesity 278.00.  ------------------------------------------------------------ Study Conclusions  - Left ventricle: The cavity size was normal. Wall thickness   was increased in a pattern of mild LVH. Systolic function   was normal. The estimated ejection fraction was in the   range of 60% to 65%. Wall motion was normal; there were no   regional wall motion abnormalities. Indeterminant   diastolic function (atrial fibrillation). - Aortic valve: There  was no stenosis. - Mitral valve: Mildly calcified annulus. Normal thickness   leaflets . Mild regurgitation. - Left atrium: The atrium was moderately dilated. - Right ventricle: The cavity size was mildly dilated.   Systolic function was normal. - Right atrium: The atrium was moderately dilated. - Tricuspid valve: Moderate regurgitation. Peak RV-RA   gradient: 55mm Hg (S). - Pulmonary arteries: PA peak pressure: 44mm Hg (S). - Systemic veins: IVC measured 2.6 cm with some   respirophasic variation, suggesting RA pressure 15 mmHg. Impressions:  - The patient was in atrial fibrillation. Normal LV size and   systolic function, EF 51-02%. Mild LV hypertrophy. Mildly   dilated LV with normal systolic function. Moderate   biatrial enlargement. Moderate TR. Moderate pulmonary   hypertension. Transthoracic echocardiography.  M-mode, complete 2D, spectral Doppler, and color Doppler.  Height:  Height: 167.6cm. Height: 66in.  Weight:  Weight: 83.5kg. Weight: 183.6lb.  Body mass index:  BMI: 29.7kg/m^2.  Body surface area:    BSA: 1.24m^2.  Blood pressure:     130/90.  Patient status:  Inpatient.  Location:  Bedside.  ------------------------------------------------------------  ------------------------------------------------------------ Left ventricle:  The cavity size was normal. Wall thickness was increased in a pattern of mild LVH. Systolic function was normal. The estimated ejection fraction was in the range of 60% to 65%. Wall motion was normal; there were no regional wall motion abnormalities. Indeterminant diastolic function (atrial fibrillation).  ------------------------------------------------------------ Aortic valve:   Mildly calcified leaflets.  Doppler:   There was no stenosis.    No regurgitation.  ------------------------------------------------------------ Aorta:  Aortic root: The aortic root was normal in size. Ascending aorta: The ascending aorta was normal in  size.  ------------------------------------------------------------ Mitral valve:   Mildly calcified annulus. Normal thickness leaflets .  Doppler:   There was no evidence for stenosis.  Mild regurgitation.    Peak gradient: 81mm Hg (D).  ------------------------------------------------------------ Left atrium:  The atrium was moderately dilated.  ------------------------------------------------------------ Right ventricle:  The cavity size was mildly dilated. Systolic function was normal.  ------------------------------------------------------------ Pulmonic valve:    Structurally normal valve.   Cusp separation was normal.  Doppler:  Transvalvular velocity was within the normal range.  No regurgitation.  ------------------------------------------------------------ Tricuspid valve:   Doppler:   Moderate regurgitation.  ------------------------------------------------------------ Right atrium:  The atrium was moderately dilated.  ------------------------------------------------------------ Pericardium:  There was no pericardial effusion.  ------------------------------------------------------------ Systemic veins:  IVC measured 2.6 cm with some respirophasic variation, suggesting RA pressure 15 mmHg.  ----------------------------------------------------------   Lab work; from 1/6 showed a BUN of 35 a creatinine of 1.59 this appears to be reasonably stable. CBC from earlier this month showed a hemoglobin of 10 platelet count of 139  Review of systems Respiratory; patient complains of some shortness of breath Cardiac no complaints of chest pain  Physical examination Temperature 97.8, pulse 78 and irregularly irregular O2 sat 99% on 2 L respirations 16 blood pressure 132/86 weight 179 Respiratory bibasilar crackles Cardiac heart sounds are regular there is no murmurs there is no S3 JVP is elevated to the angle of the jaw Abdomen somewhat distended but no tenderness is  noted. Extremities widespread edema which goes right up into her flanks bilaterally involves her bilateral arms  Impressions Biventricular heart failure. She was on Lasix 20 mg up to 12:30. Then she received 60 mg for a week and then it was decreased to 40 mg. On 1/7 Lasix was again increased to 60 mg for 3 days then 40 mg. A chest x-ray has already been ordered as well as a BMP and BNP. Lasix will be increased to 80 mg daily starting tomorrow  Chronic renal insufficiency this does not appear to be unstable  I will initiate weights, Foley catheter for fluid monitoring.

## 2013-07-12 ENCOUNTER — Non-Acute Institutional Stay (SKILLED_NURSING_FACILITY): Payer: Medicare Other | Admitting: Internal Medicine

## 2013-07-12 DIAGNOSIS — N183 Chronic kidney disease, stage 3 unspecified: Secondary | ICD-10-CM

## 2013-07-12 DIAGNOSIS — R1013 Epigastric pain: Secondary | ICD-10-CM

## 2013-07-12 DIAGNOSIS — I509 Heart failure, unspecified: Secondary | ICD-10-CM

## 2013-07-14 DIAGNOSIS — R1013 Epigastric pain: Secondary | ICD-10-CM | POA: Insufficient documentation

## 2013-07-14 NOTE — Progress Notes (Signed)
         PROGRESS NOTE  DATE: 07/12/2013  FACILITY:  [facility]  LEVEL OF CARE: SNF (31)  Acute Visit  CHIEF COMPLAINT:  Manage chronic kidney disease stage III, CHF and abdominal pain  HISTORY OF PRESENT ILLNESS: I was requested by the staff to assess the patient regarding above problem(s):  CHRONIC KIDNEY DISEASE: The patient's chronic kidney disease is unstable.  Staff deny increasing lower extremity swelling or confusion. Last BUN and creatinine are: 32, 1.65 on 07-10-13. On 07-03-13 BUN 35, creatinine 1.59. In 11-14 BUN 22, creatinine 1.15. Patient is a poor historian.  CHF:The staff does not relate significant weight changes, deny sob, DOE, orthopnea, PNDs, palpitations or chest pain.  CHF is unstable.  No complications form the medications being used. Patient has chronic lower extremity swelling. On 07-10-13 BNP 604.  ABDOMINAL PAIN: New problem. The patient is complaining of new onset abdominal pain today. Pain is located at the mid epigastrium. The patient denies nausea, vomiting, diarrhea or constipation. There is no radiation. Precipitating or alleviating factors cannot be identified. There is no temporal relationship.  PAST MEDICAL HISTORY : Reviewed.  No changes.  CURRENT MEDICATIONS: Reviewed per [facility]  REVIEW OF SYSTEMS: Unobtainable due to patient being a poor historian  PHYSICAL EXAMINATION  GENERAL: no acute distress, moderately obese body habitus EYES: conjunctivae normal, sclerae normal, normal eye lids NECK: supple, trachea midline, no neck masses, no thyroid tenderness, no thyromegaly LYMPHATICS: no LAN in the neck, no supraclavicular LAN RESPIRATORY: breathing is even & unlabored, BS CTAB CARDIAC: RRR, no murmur,no extra heart sounds, bilateral lower extremities edematous and wrapped GI: abdomen soft, normal BS, no masses, no tenderness, no hepatomegaly, no splenomegaly PSYCHIATRIC: the patient is alert & oriented to person, depressed affect &  behavior appropriate  LABS/RADIOLOGY: See history of present illness  ASSESSMENT/PLAN:  Chronic kidney disease stage III-unstable problem. Renal functions are worsening likely due to Lasix. Re check CHF exacerbation-unstable problem. Lasix 80 mg daily started. Epigastric pain-new problem. Patient is on laxatives. Start Protonix 40 mg daily.  CPT CODE: 99833

## 2013-07-17 ENCOUNTER — Non-Acute Institutional Stay (SKILLED_NURSING_FACILITY): Payer: Medicare Other | Admitting: Internal Medicine

## 2013-07-17 DIAGNOSIS — N179 Acute kidney failure, unspecified: Secondary | ICD-10-CM

## 2013-07-19 ENCOUNTER — Telehealth: Payer: Self-pay | Admitting: Cardiology

## 2013-07-19 ENCOUNTER — Telehealth: Payer: Self-pay | Admitting: Physician Assistant

## 2013-07-19 NOTE — Telephone Encounter (Signed)
I cb pt's daughter Izora Gala in refernce to her concerns for her mom (pt). She states mom in [facility]. States pt has increase fluid, on O2, states SNF increased lasix to 80 mg per daughter, sob, decreased appetite, pt c/o all over pain. Daughter Izora Gala concerned and states she cannot not be at appt tomorrow 07/20/13 @ 11:30 with Brynda Rim. PA. I said I will relay the message to the PA as to her concerns for her mother.

## 2013-07-19 NOTE — Telephone Encounter (Signed)
New Problem:  Pt's daughter, Izora Gala,  is requesting a call back. She states she will not be at her mom's appt tomorrow but she would like to tell the nurse what has been going on with her mom. Izora Gala states she will give more details when the nurse calls.

## 2013-07-19 NOTE — Telephone Encounter (Deleted)
error 

## 2013-07-20 ENCOUNTER — Ambulatory Visit: Payer: Medicare Other | Admitting: Nurse Practitioner

## 2013-07-20 ENCOUNTER — Encounter: Payer: Self-pay | Admitting: Physician Assistant

## 2013-07-20 ENCOUNTER — Ambulatory Visit (INDEPENDENT_AMBULATORY_CARE_PROVIDER_SITE_OTHER): Payer: Medicare Other | Admitting: Physician Assistant

## 2013-07-20 ENCOUNTER — Encounter (HOSPITAL_COMMUNITY): Payer: Self-pay | Admitting: General Practice

## 2013-07-20 ENCOUNTER — Inpatient Hospital Stay (HOSPITAL_COMMUNITY)
Admission: AD | Admit: 2013-07-20 | Discharge: 2013-08-01 | DRG: 292 | Disposition: A | Discharge status: Skilled Nursing Facility | Payer: Medicare Other | Source: Ambulatory Visit | Attending: Cardiology | Admitting: Cardiology

## 2013-07-20 VITALS — BP 132/86 | HR 78 | Ht 66.0 in

## 2013-07-20 DIAGNOSIS — F329 Major depressive disorder, single episode, unspecified: Secondary | ICD-10-CM | POA: Diagnosis present

## 2013-07-20 DIAGNOSIS — I509 Heart failure, unspecified: Secondary | ICD-10-CM | POA: Diagnosis present

## 2013-07-20 DIAGNOSIS — L03116 Cellulitis of left lower limb: Secondary | ICD-10-CM

## 2013-07-20 DIAGNOSIS — S301XXA Contusion of abdominal wall, initial encounter: Secondary | ICD-10-CM | POA: Diagnosis present

## 2013-07-20 DIAGNOSIS — I4891 Unspecified atrial fibrillation: Secondary | ICD-10-CM | POA: Diagnosis present

## 2013-07-20 DIAGNOSIS — E876 Hypokalemia: Secondary | ICD-10-CM | POA: Diagnosis not present

## 2013-07-20 DIAGNOSIS — L03115 Cellulitis of right lower limb: Secondary | ICD-10-CM

## 2013-07-20 DIAGNOSIS — E1121 Type 2 diabetes mellitus with diabetic nephropathy: Secondary | ICD-10-CM | POA: Diagnosis present

## 2013-07-20 DIAGNOSIS — N058 Unspecified nephritic syndrome with other morphologic changes: Secondary | ICD-10-CM | POA: Diagnosis present

## 2013-07-20 DIAGNOSIS — I2789 Other specified pulmonary heart diseases: Secondary | ICD-10-CM | POA: Diagnosis present

## 2013-07-20 DIAGNOSIS — R601 Generalized edema: Secondary | ICD-10-CM | POA: Diagnosis present

## 2013-07-20 DIAGNOSIS — I10 Essential (primary) hypertension: Secondary | ICD-10-CM | POA: Diagnosis present

## 2013-07-20 DIAGNOSIS — R5381 Other malaise: Secondary | ICD-10-CM | POA: Diagnosis present

## 2013-07-20 DIAGNOSIS — E119 Type 2 diabetes mellitus without complications: Secondary | ICD-10-CM

## 2013-07-20 DIAGNOSIS — N390 Urinary tract infection, site not specified: Secondary | ICD-10-CM | POA: Diagnosis not present

## 2013-07-20 DIAGNOSIS — I482 Chronic atrial fibrillation, unspecified: Secondary | ICD-10-CM | POA: Diagnosis present

## 2013-07-20 DIAGNOSIS — I890 Lymphedema, not elsewhere classified: Secondary | ICD-10-CM | POA: Diagnosis present

## 2013-07-20 DIAGNOSIS — E785 Hyperlipidemia, unspecified: Secondary | ICD-10-CM | POA: Diagnosis present

## 2013-07-20 DIAGNOSIS — I129 Hypertensive chronic kidney disease with stage 1 through stage 4 chronic kidney disease, or unspecified chronic kidney disease: Secondary | ICD-10-CM | POA: Diagnosis present

## 2013-07-20 DIAGNOSIS — R609 Edema, unspecified: Secondary | ICD-10-CM

## 2013-07-20 DIAGNOSIS — IMO0002 Reserved for concepts with insufficient information to code with codable children: Secondary | ICD-10-CM | POA: Diagnosis present

## 2013-07-20 DIAGNOSIS — I50810 Right heart failure, unspecified: Secondary | ICD-10-CM

## 2013-07-20 DIAGNOSIS — Z87891 Personal history of nicotine dependence: Secondary | ICD-10-CM

## 2013-07-20 DIAGNOSIS — F3289 Other specified depressive episodes: Secondary | ICD-10-CM | POA: Diagnosis present

## 2013-07-20 DIAGNOSIS — I5033 Acute on chronic diastolic (congestive) heart failure: Principal | ICD-10-CM | POA: Diagnosis present

## 2013-07-20 DIAGNOSIS — Z79899 Other long term (current) drug therapy: Secondary | ICD-10-CM

## 2013-07-20 DIAGNOSIS — N183 Chronic kidney disease, stage 3 unspecified: Secondary | ICD-10-CM | POA: Diagnosis present

## 2013-07-20 DIAGNOSIS — N039 Chronic nephritic syndrome with unspecified morphologic changes: Secondary | ICD-10-CM

## 2013-07-20 DIAGNOSIS — F411 Generalized anxiety disorder: Secondary | ICD-10-CM | POA: Diagnosis present

## 2013-07-20 DIAGNOSIS — E1129 Type 2 diabetes mellitus with other diabetic kidney complication: Secondary | ICD-10-CM | POA: Diagnosis present

## 2013-07-20 DIAGNOSIS — R627 Adult failure to thrive: Secondary | ICD-10-CM | POA: Diagnosis present

## 2013-07-20 DIAGNOSIS — E039 Hypothyroidism, unspecified: Secondary | ICD-10-CM | POA: Diagnosis present

## 2013-07-20 DIAGNOSIS — D631 Anemia in chronic kidney disease: Secondary | ICD-10-CM | POA: Diagnosis present

## 2013-07-20 DIAGNOSIS — N182 Chronic kidney disease, stage 2 (mild): Secondary | ICD-10-CM | POA: Diagnosis present

## 2013-07-20 HISTORY — DX: Generalized edema: R60.1

## 2013-07-20 HISTORY — DX: Heart failure, unspecified: I50.9

## 2013-07-20 LAB — GLUCOSE, CAPILLARY
GLUCOSE-CAPILLARY: 148 mg/dL — AB (ref 70–99)
Glucose-Capillary: 106 mg/dL — ABNORMAL HIGH (ref 70–99)

## 2013-07-20 MED ORDER — OXYCODONE HCL 5 MG PO TABS
5.0000 mg | ORAL_TABLET | ORAL | Status: DC | PRN
  Administered 2013-07-21 – 2013-08-01 (×25): 5 mg via ORAL
  Filled 2013-07-20 (×27): qty 1

## 2013-07-20 MED ORDER — ENSURE PUDDING PO PUDG
1.0000 | Freq: Three times a day (TID) | ORAL | Status: DC
Start: 2013-07-20 — End: 2013-07-25
  Administered 2013-07-20 – 2013-07-24 (×11): 1 via ORAL

## 2013-07-20 MED ORDER — LABETALOL HCL 300 MG PO TABS
300.0000 mg | ORAL_TABLET | Freq: Two times a day (BID) | ORAL | Status: DC
  Administered 2013-07-20 – 2013-07-26 (×13): 300 mg via ORAL
  Filled 2013-07-20 (×16): qty 1

## 2013-07-20 MED ORDER — FERROUS GLUCONATE 324 (38 FE) MG PO TABS
324.0000 mg | ORAL_TABLET | Freq: Every day | ORAL | Status: DC
  Administered 2013-07-21 – 2013-08-01 (×12): 324 mg via ORAL
  Filled 2013-07-20 (×13): qty 1

## 2013-07-20 MED ORDER — SODIUM CHLORIDE 0.9 % IJ SOLN: 3.0000 mL | INTRAMUSCULAR | Status: DC | PRN

## 2013-07-20 MED ORDER — POTASSIUM CHLORIDE CRYS ER 20 MEQ PO TBCR
20.0000 meq | EXTENDED_RELEASE_TABLET | Freq: Two times a day (BID) | ORAL | Status: DC
Start: 2013-07-20 — End: 2013-07-22
  Administered 2013-07-20 – 2013-07-21 (×3): 20 meq via ORAL
  Filled 2013-07-20 (×5): qty 1

## 2013-07-20 MED ORDER — VITAMIN D 1000 UNITS PO CAPS: 1000.0000 [IU] | ORAL_CAPSULE | Freq: Every day | ORAL | Status: DC

## 2013-07-20 MED ORDER — SODIUM BICARBONATE 650 MG PO TABS
650.0000 mg | ORAL_TABLET | Freq: Three times a day (TID) | ORAL | Status: DC
  Administered 2013-07-20 – 2013-08-01 (×35): 650 mg via ORAL
  Filled 2013-07-20 (×39): qty 1

## 2013-07-20 MED ORDER — ONDANSETRON HCL 4 MG/2ML IJ SOLN: 4.0000 mg | Freq: Four times a day (QID) | INTRAMUSCULAR | Status: DC | PRN

## 2013-07-20 MED ORDER — DILTIAZEM HCL ER COATED BEADS 120 MG PO CP24
120.0000 mg | ORAL_CAPSULE | Freq: Every day | ORAL | Status: DC
  Administered 2013-07-21 – 2013-07-22 (×2): 120 mg via ORAL
  Filled 2013-07-20 (×3): qty 1

## 2013-07-20 MED ORDER — ONDANSETRON HCL 4 MG PO TABS: 4.0000 mg | ORAL_TABLET | Freq: Three times a day (TID) | ORAL | Status: DC | PRN

## 2013-07-20 MED ORDER — SODIUM CHLORIDE 0.9 % IJ SOLN
3.0000 mL | Freq: Two times a day (BID) | INTRAMUSCULAR | Status: DC
  Administered 2013-07-20 – 2013-07-30 (×13): 3 mL via INTRAVENOUS

## 2013-07-20 MED ORDER — VITAMIN D3 25 MCG (1000 UNIT) PO TABS
1000.0000 [IU] | ORAL_TABLET | Freq: Every day | ORAL | Status: DC
  Administered 2013-07-21 – 2013-08-01 (×12): 1000 [IU] via ORAL
  Filled 2013-07-20 (×13): qty 1

## 2013-07-20 MED ORDER — LEVOTHYROXINE SODIUM 25 MCG PO TABS
25.0000 ug | ORAL_TABLET | Freq: Every day | ORAL | Status: DC
  Administered 2013-07-22 – 2013-08-01 (×11): 25 ug via ORAL
  Filled 2013-07-20 (×12): qty 1

## 2013-07-20 MED ORDER — ALPRAZOLAM 0.25 MG PO TABS
0.2500 mg | ORAL_TABLET | Freq: Two times a day (BID) | ORAL | Status: DC | PRN
  Administered 2013-07-21 – 2013-07-31 (×14): 0.25 mg via ORAL
  Filled 2013-07-20 (×15): qty 1

## 2013-07-20 MED ORDER — DOCUSATE SODIUM 100 MG PO CAPS
100.0000 mg | ORAL_CAPSULE | Freq: Two times a day (BID) | ORAL | Status: DC | PRN
  Filled 2013-07-20: qty 1

## 2013-07-20 MED ORDER — ACETAMINOPHEN 325 MG PO TABS
650.0000 mg | ORAL_TABLET | ORAL | Status: DC | PRN
  Administered 2013-07-21 – 2013-07-31 (×6): 650 mg via ORAL
  Filled 2013-07-20 (×6): qty 2

## 2013-07-20 MED ORDER — MULTI-VITAMIN/MINERALS PO TABS: 1.0000 | ORAL_TABLET | Freq: Every day | ORAL | Status: DC

## 2013-07-20 MED ORDER — SODIUM CHLORIDE 0.9 % IV SOLN: 250.0000 mL | INTRAVENOUS | Status: DC | PRN

## 2013-07-20 MED ORDER — ADULT MULTIVITAMIN W/MINERALS CH
1.0000 | ORAL_TABLET | Freq: Every day | ORAL | Status: DC
  Administered 2013-07-21 – 2013-08-01 (×12): 1 via ORAL
  Filled 2013-07-20 (×13): qty 1

## 2013-07-20 MED ORDER — FUROSEMIDE 10 MG/ML IJ SOLN
80.0000 mg | Freq: Two times a day (BID) | INTRAMUSCULAR | Status: DC
  Administered 2013-07-20 – 2013-07-21 (×2): 80 mg via INTRAVENOUS
  Filled 2013-07-20 (×3): qty 8

## 2013-07-20 MED ORDER — FERROUS GLUCONATE 324 (38 FE) MG PO TABS: 325.0000 mg | ORAL_TABLET | Freq: Every day | ORAL | Status: DC

## 2013-07-20 MED ORDER — ZINC SULFATE 220 (50 ZN) MG PO CAPS
220.0000 mg | ORAL_CAPSULE | Freq: Every day | ORAL | Status: DC
  Administered 2013-07-21 – 2013-08-01 (×12): 220 mg via ORAL
  Filled 2013-07-20 (×13): qty 1

## 2013-07-20 MED ORDER — ATORVASTATIN CALCIUM 20 MG PO TABS
20.0000 mg | ORAL_TABLET | Freq: Every day | ORAL | Status: DC
  Administered 2013-07-20 – 2013-07-31 (×12): 20 mg via ORAL
  Filled 2013-07-20 (×13): qty 1

## 2013-07-20 MED ORDER — CITALOPRAM HYDROBROMIDE 20 MG PO TABS
20.0000 mg | ORAL_TABLET | Freq: Every day | ORAL | Status: DC
Start: 2013-07-20 — End: 2013-08-01
  Administered 2013-07-21 – 2013-08-01 (×12): 20 mg via ORAL
  Filled 2013-07-20 (×13): qty 1

## 2013-07-20 MED ORDER — ENOXAPARIN SODIUM 40 MG/0.4ML ~~LOC~~ SOLN
40.0000 mg | SUBCUTANEOUS | Status: DC
  Administered 2013-07-20 – 2013-07-31 (×12): 40 mg via SUBCUTANEOUS
  Filled 2013-07-20 (×13): qty 0.4

## 2013-07-20 MED ORDER — HYDRALAZINE HCL 50 MG PO TABS
50.0000 mg | ORAL_TABLET | Freq: Three times a day (TID) | ORAL | Status: DC
  Administered 2013-07-20 – 2013-07-26 (×18): 50 mg via ORAL
  Filled 2013-07-20 (×25): qty 1

## 2013-07-20 MED ORDER — COLLAGENASE 250 UNIT/GM EX OINT
TOPICAL_OINTMENT | Freq: Every day | CUTANEOUS | Status: DC
  Administered 2013-07-21 – 2013-08-01 (×6): via TOPICAL
  Filled 2013-07-20: qty 30

## 2013-07-20 NOTE — H&P (Signed)
History and Physical   Date:  07/20/2013   ID:  [name], DOB 1932-04-24, MRN [id number]  PCP:  Cathlean Cower, MD  Cardiologist:  Dr. Minus Breeding     History of Present Illness: [name] is a 80 y.o. female with a h/o AFib, pulmonary HTN, right sided heart failure, DM2, HTN, HL and CKD.  Last seen by Dr. Percival Spanish 02/2012.  Echo (04/2013):  Mild LVH, EF 60-65%, mild MAC, mild MR, mod LAE, mild RVE, normal RVF, mod RAE, mod TR, PASP 65.  She has had several admissions over the last year. She was admitted in 01/2013 for lower extremity cellulitis and in 03/2013 for failure to thrive. She was most recently admitted 04/2013 with a moderate to large rectus sheath hematoma. She initially presented to the hospital with fatigue and lethargy. Hemoglobin was 6.1. She was transfused with PRBCs. She was given FFP to reverse her Coumadin. She was seen by general surgery.  When last seen by Dr. Zella Richer on 05/14/13, he recommended to not restart Coumadin for one to 2 weeks. No surgical intervention was required. She was asked to follow up with cardiology to determine when she should resume Coumadin.  She lives at [facility].  I saw her in f/u 06/26/13.  She had significant LE edema with significant desquamation of bilateral LEs.  I called her RN and spoke with her by phone.  It was noted that the patient's legs had been wrapped for some time.  Apparently her legs looked better and is being followed by wound management at her facility.  She still appeared volume overloaded when I saw her.  I adjusted her Lasix.  I left her off of coumadin until I could review her risks vs benefits with Dr. Minus Breeding.    She returns for follow up today.  She is massively volume overloaded.   She notes NYHA Class 3b dyspnea.  She notes orthopnea but no PND.  No chest pain.  No syncope.  She notes a cough with clear sputum.  She also notes post prandial nausea and vomiting.  She was recently started on Protonix  (according to the records but this medication is not on her list).     Recent Labs: 04/21/2013: TSH 1.316  05/10/2013: Pro B Natriuretic peptide (BNP) 3105.0*  05/12/2013: ALT 19  05/14/2013: Hemoglobin 9.1*  06/26/2013: Creatinine 1.6*; Potassium 4.2   Wt Readings from Last 3 Encounters:  06/26/13 183 lb (83.008 kg)  05/14/13 186 lb 11.7 oz (84.7 kg)  04/25/13 184 lb 1.4 oz (83.5 kg)     Past Medical History  Diagnosis Date  . Obesity   . Anemia, iron deficiency   . Depression   . Diabetes mellitus type II   . Hypertension   . Peptic ulcer   . History of uterine fibroid   . Hx of colonic polyps   . Diverticula, colon   . Osteoarthritis   . Low back pain   . Pulmonary hypertension     a. echo 4/12:  EF 55-60%, mod LVH, mild BAE, RVE and PASP 51;  b.  Echo (04/2013):  Mild LVH, EF 60-65%, mild MAC, mild MR, mod LAE, mild RVE, normal RVF, mod RAE, mod TR, PASP 65  . Anxiety   . HYPERLIPIDEMIA 03/05/2007  . ANEMIA-IRON DEFICIENCY 03/05/2007  . Right-sided heart failure 03/05/2007  . LOW BACK PAIN 03/05/2007  . Chronic pain syndrome 06/23/2010  . Hypothyroidism 10/15/2010  . CKD (chronic kidney disease) stage  3, GFR 30-59 ml/min 10/15/2010  . Anemia of chronic disease 05/11/2013  . Atrial fibrillation     permanent, on coumadin;  coumadin stopped at time of spontaneous rectus sheat hematoma 04/2013    Past Surgical History  Procedure Laterality Date  . Laparoscopic hysterectomy    . Colonoscopy  2009    Current Outpatient Prescriptions  Medication Sig Dispense Refill  . ALPRAZolam (XANAX) 0.25 MG tablet Take 1 tablet (0.25 mg total) by mouth 2 (two) times daily as needed. For anxiety  60 tablet  5  . atorvastatin (LIPITOR) 20 MG tablet Take 1 tablet (20 mg total) by mouth daily at 6 PM.  30 tablet  0  . Cholecalciferol (VITAMIN D) 1000 UNITS capsule Take 1,000 Units by mouth daily.       . citalopram (CELEXA) 20 MG tablet Take 20 mg by mouth daily.      . collagenase  (SANTYL) ointment Apply topically daily.  15 g  0  . diltiazem (CARDIZEM CD) 120 MG 24 hr capsule Take 120 mg by mouth daily.      Marland Kitchen docusate sodium (COLACE) 100 MG capsule Take 100 mg by mouth 2 (two) times daily as needed. For constipation       . feeding supplement, ENSURE, (ENSURE) PUDG Take 1 Container by mouth 3 (three) times daily between meals.      . ferrous gluconate (FERGON) 325 MG tablet Take 325 mg by mouth daily with breakfast.      . furosemide (LASIX) 40 MG tablet Take 60 mg daily for 1 week then decrease to 40 mg daily      . hydrALAZINE (APRESOLINE) 50 MG tablet Take 50 mg by mouth 3 (three) times daily.      Marland Kitchen HYDROcodone-acetaminophen (NORCO) 7.5-325 MG per tablet Take one tablet by mouth four times daily for pain  120 tablet  0  . labetalol (NORMODYNE) 300 MG tablet Take 300 mg by mouth 2 (two) times daily.      Marland Kitchen levothyroxine (SYNTHROID, LEVOTHROID) 25 MCG tablet Take 25 mcg by mouth daily before breakfast.      . Multiple Vitamins-Minerals (MULTIVITAMIN WITH MINERALS) tablet Take 1 tablet by mouth daily.      . ondansetron (ZOFRAN) 4 MG tablet Take 1 tablet (4 mg total) by mouth every 8 (eight) hours as needed for nausea.  30 tablet  1  . oxyCODONE (OXY IR/ROXICODONE) 5 MG immediate release tablet Take 5 mg by mouth every 4 (four) hours as needed for severe pain (prior to dressing change).      . potassium chloride (K-DUR) 10 MEQ tablet Take 1 tablet (10 mEq total) by mouth daily.      . sodium bicarbonate 650 MG tablet Take 1 tablet (650 mg total) by mouth 3 (three) times daily.      Marland Kitchen zinc sulfate 220 MG capsule Take 220 mg by mouth daily.       No current facility-administered medications for this visit.    Allergies:   Clonidine derivatives   Social History:  The patient  reports that she quit smoking about 40 years ago. She started smoking about 41 years ago. She has never used smokeless tobacco. She reports that she does not drink alcohol or use illicit drugs.    Family History:  The patient's family history includes Hypertension in her sister.   ROS:  Please see the history of present illness.    All other systems reviewed and negative.   PHYSICAL EXAM:  VS:  BP 132/86  Pulse 78  Ht 5\' 6"  (1.676 m) Chronically ill appearing female somewhat lethargic but in NAD HEENT: normal Neck: + JVD at 90 degrees Cardiac:  distant S1, S2; irreg irreg rhythm; no murmur Lungs:  Decreased breath sounds bilaterally, no wheezing, rhonchi or rales Abd:  distended Ext: massive bilateral UE and LE edema  Skin: warm and dry Neuro:  CNs 2-12 intact, no focal abnormalities noted  EKG:  AFib, HR 78, low voltage, RBBB     ASSESSMENT AND PLAN:  1. Right Sided Heart Failure:  She is here with anasarca.  She requires admission to the hospital for IV diuresis.  She was also seen by Dr. Minus Breeding today.  admit to [facility] today.  Start Lasix 80 IV bid.  She may need Lasix gtt given the degree of volume overload.  Follow renal fxn and K+ closely.    Continue to wrap legs with wound management.   2. Atrial Fibrillation:  CHADS2-VASc=9.  HAS-BLED=4.  She had a spontaneous rectus sheath hematoma with massive blood loss.  Hgb was 6 upon admission in 04/2013. I am not convinced she is a good candidate to resume coumadin.  She certainly is at increased risk of stroke (annual risk 17.1%).  Annual risk of major bleeding on coumadin is 9.4%.  Will cover her with DVT dose Lovenox in the hospital.  We will need to continue to consider whether or not she can resume coumadin.    3. Hypertension:  Controlled. 4. Hyperlipidemia:  Continue statin. 5. Hypothyroidism:  Continue current dose of synthroid.  6. Disposition:  Admit to [facility] for IV diuresis.   Signed, Richardson Dopp, PA-C  07/20/2013 12:50 PM    History and all data above reviewed.  Patient examined.  I agree with the findings as above. She has had progressive edema despite medical management. The patient exam reveals  [id number] heart sounds  ,  Lungs: decreased breath sounds bilaterally  ,  Abd: morbidly obese, Ext severe diffuse edema  .  All available labs, radiology testing, previous records reviewed. Agree with documented assessment and plan. The patient has had failure to thrive with worsening edema despite increased oral diuretics. She will be admitted for IV diuresis. Her going to need to arrange some closer followup in the nursing home with daily weights and when necessary dosing of diuretic.  Jeneen Rinks Haziel Molner  4:53 PM  07/20/2013

## 2013-07-20 NOTE — Telephone Encounter (Signed)
Patient admitted from office today. Richardson Dopp, PA-C   07/20/2013 2:03 PM

## 2013-07-20 NOTE — Care Management Note (Addendum)
    Page 1 of 1   07/24/2013     2:23:13 PM   CARE MANAGEMENT NOTE 07/24/2013  Patient:  [name], [name]   Account Number:  [id number]  Date Initiated:  07/20/2013  Documentation initiated by:  HUTCHINSON,CRYSTAL  Subjective/Objective Assessment:   Right Sided Heart Failure:  She is here with anasarca.  She requires admission to the hospital for IV diuresis//From [facility] SNF     Action/Plan:   IV diureses//Return to [facility]   Anticipated DC Date:  07/23/2013   Anticipated DC Plan:  [place]  In-house referral  Clinical Social Worker      DC Planning Services  CM consult      Choice offered to / List presented to:             Status of service:   Medicare Important Message given?   (If response is "NO", the following Medicare IM given date fields will be blank) Date Medicare IM given:   Date Additional Medicare IM given:    Discharge Disposition:    Per UR Regulation:  Reviewed for med. necessity/level of care/duration of stay  If discussed at [place] of Stay Meetings, dates discussed:   07/26/2013    Comments:  07/24/13 [place], RN, BSN, [place] [phone] PLAN:  Start K+ supplement, check Mg++ in am. She is diuresing on current regimen (IV lasix 80mg  BID), wgt down 9 lbs, still a ways to go. Check stools, she is on Iron supplement.  07/20/2013 Admittd with Right Sided Heart Failure  with anasarca.  IV diuresis [address] RN, BSN, [place], [place] [phone] Unit) [phone] 07/20/2013

## 2013-07-20 NOTE — Progress Notes (Signed)
UR completed Minnette Merida K. Ronda Kazmi, RN, BSN, MSHL, CCM  07/20/2013 3:35 PM

## 2013-07-20 NOTE — Progress Notes (Signed)
[address] Floris [name], [place]  [address] Phone: [phone] Fax:  [phone]  Date:  07/20/2013   ID:  [name] 1931/09/20, MRN [id number]  PCP:  Cathlean Cower, MD  Cardiologist:  Dr. Minus Breeding     History of Present Illness: [name] is a 80 y.o. female with a h/o AFib, pulmonary HTN, right sided heart failure, DM2, HTN, HL and CKD.  Last seen by Dr. Percival Spanish 02/2012.  Echo (04/2013):  Mild LVH, EF 60-65%, mild MAC, mild MR, mod LAE, mild RVE, normal RVF, mod RAE, mod TR, PASP 65.  She has had several admissions over the last year. She was admitted in 01/2013 for lower extremity cellulitis and in 03/2013 for failure to thrive. She was most recently admitted 04/2013 with a moderate to large rectus sheath hematoma. She initially presented to the hospital with fatigue and lethargy. Hemoglobin was 6.1. She was transfused with PRBCs. She was given FFP to reverse her Coumadin. She was seen by general surgery.  When last seen by Dr. Zella Richer on 05/14/13, he recommended to not restart Coumadin for one to 2 weeks. No surgical intervention was required. She was asked to follow up with cardiology to determine when she should resume Coumadin.  She lives at [facility].  I saw her in f/u 06/26/13.  She had significant LE edema with significant desquamation of bilateral LEs.  I called her RN and spoke with her by phone.  It was noted that the patient's legs had been wrapped for some time.  Apparently her legs looked better and is being followed by wound management at her facility.  She still appeared volume overloaded when I saw her.  I adjusted her Lasix.  I left her off of coumadin until I could review her risks vs benefits with Dr. Minus Breeding.    She returns for follow up today.  She is massively volume overloaded.   She notes NYHA Class 3b dyspnea.  She notes orthopnea but no PND.  No chest pain.  No syncope.  She notes a cough with clear sputum.  She also notes post  prandial nausea and vomiting.  She was recently started on Protonix (according to the records but this medication is not on her list).     Recent Labs: 04/21/2013: TSH 1.316  05/10/2013: Pro B Natriuretic peptide (BNP) 3105.0*  05/12/2013: ALT 19  05/14/2013: Hemoglobin 9.1*  06/26/2013: Creatinine 1.6*; Potassium 4.2   Wt Readings from Last 3 Encounters:  06/26/13 183 lb (83.008 kg)  05/14/13 186 lb 11.7 oz (84.7 kg)  04/25/13 184 lb 1.4 oz (83.5 kg)     Past Medical History  Diagnosis Date  . Obesity   . Anemia, iron deficiency   . Depression   . Diabetes mellitus type II   . Hypertension   . Peptic ulcer   . History of uterine fibroid   . Hx of colonic polyps   . Diverticula, colon   . Osteoarthritis   . Low back pain   . Pulmonary hypertension     a. echo 4/12:  EF 55-60%, mod LVH, mild BAE, RVE and PASP 51;  b.  Echo (04/2013):  Mild LVH, EF 60-65%, mild MAC, mild MR, mod LAE, mild RVE, normal RVF, mod RAE, mod TR, PASP 65  . Anxiety   . HYPERLIPIDEMIA 03/05/2007  . ANEMIA-IRON DEFICIENCY 03/05/2007  . Right-sided heart failure 03/05/2007  . LOW BACK PAIN 03/05/2007  . Chronic pain  syndrome 06/23/2010  . Hypothyroidism 10/15/2010  . CKD (chronic kidney disease) stage 3, GFR 30-59 ml/min 10/15/2010  . Anemia of chronic disease 05/11/2013  . Atrial fibrillation     permanent, on coumadin;  coumadin stopped at time of spontaneous rectus sheat hematoma 04/2013    Current Outpatient Prescriptions  Medication Sig Dispense Refill  . ALPRAZolam (XANAX) 0.25 MG tablet Take 1 tablet (0.25 mg total) by mouth 2 (two) times daily as needed. For anxiety  60 tablet  5  . atorvastatin (LIPITOR) 20 MG tablet Take 1 tablet (20 mg total) by mouth daily at 6 PM.  30 tablet  0  . Cholecalciferol (VITAMIN D) 1000 UNITS capsule Take 1,000 Units by mouth daily.       . citalopram (CELEXA) 20 MG tablet Take 20 mg by mouth daily.      . collagenase (SANTYL) ointment Apply topically daily.  15  g  0  . diltiazem (CARDIZEM CD) 120 MG 24 hr capsule Take 120 mg by mouth daily.      Marland Kitchen docusate sodium (COLACE) 100 MG capsule Take 100 mg by mouth 2 (two) times daily as needed. For constipation       . feeding supplement, ENSURE, (ENSURE) PUDG Take 1 Container by mouth 3 (three) times daily between meals.      . ferrous gluconate (FERGON) 325 MG tablet Take 325 mg by mouth daily with breakfast.      . furosemide (LASIX) 40 MG tablet Take 60 mg daily for 1 week then decrease to 40 mg daily      . hydrALAZINE (APRESOLINE) 50 MG tablet Take 50 mg by mouth 3 (three) times daily.      Marland Kitchen HYDROcodone-acetaminophen (NORCO) 7.5-325 MG per tablet Take one tablet by mouth four times daily for pain  120 tablet  0  . labetalol (NORMODYNE) 300 MG tablet Take 300 mg by mouth 2 (two) times daily.      Marland Kitchen levothyroxine (SYNTHROID, LEVOTHROID) 25 MCG tablet Take 25 mcg by mouth daily before breakfast.      . Multiple Vitamins-Minerals (MULTIVITAMIN WITH MINERALS) tablet Take 1 tablet by mouth daily.      . ondansetron (ZOFRAN) 4 MG tablet Take 1 tablet (4 mg total) by mouth every 8 (eight) hours as needed for nausea.  30 tablet  1  . oxyCODONE (OXY IR/ROXICODONE) 5 MG immediate release tablet Take 5 mg by mouth every 4 (four) hours as needed for severe pain (prior to dressing change).      . potassium chloride (K-DUR) 10 MEQ tablet Take 1 tablet (10 mEq total) by mouth daily.      . sodium bicarbonate 650 MG tablet Take 1 tablet (650 mg total) by mouth 3 (three) times daily.      Marland Kitchen zinc sulfate 220 MG capsule Take 220 mg by mouth daily.       No current facility-administered medications for this visit.    Allergies:   Clonidine derivatives   Social History:  The patient  reports that she quit smoking about 40 years ago. She started smoking about 41 years ago. She has never used smokeless tobacco. She reports that she does not drink alcohol or use illicit drugs.   Family History:  The patient's family history  includes Hypertension in her sister.   ROS:  Please see the history of present illness.    All other systems reviewed and negative.   PHYSICAL EXAM: VS:  BP 132/86  Pulse 78  Ht 5\' 6"  (1.676 m) Chronically ill appearing female somewhat lethargic but in NAD HEENT: normal Neck: + JVD at 90 degrees Cardiac:  distant S1, S2; irreg irreg rhythm; no murmur Lungs:  Decreased breath sounds bilaterally, no wheezing, rhonchi or rales Abd:  distended Ext: massive bilateral UE and LE edema  Skin: warm and dry Neuro:  CNs 2-12 intact, no focal abnormalities noted  EKG:  AFib, HR 78, low voltage, RBBB     ASSESSMENT AND PLAN:  1. Right Sided Heart Failure:  She is here with anasarca.  He requires admission to the hospital for IV diuresis.  She was also seen by Dr. Minus Breeding today.  admit to [facility] today.  Start Lasix 80 IV bid.  She may need Lasix gtt given the degree of volume overload.  Follow renal fxn and K+ closely.    Continue to wrap legs with wound management.   2. Atrial Fibrillation:  CHADS2-VASc=9.  HAS-BLED=4.  She had a spontaneous rectus sheath hematoma with massive blood loss.  Hgb was 6 upon admission in 04/2013. I am not convinced she is a good candidate to resume coumadin.  She certainly is at increased risk of stroke (annual risk 17.1%).  Annual risk of major bleeding on coumadin is 9.4%.  Will cover her with DVT dose Lovenox in the hospital.  We will need to continue to consider whether or not she can resume coumadin.    3. Hypertension:  Controlled. 4. Hyperlipidemia:  Continue statin. 5. Hypothyroidism:  Continue current dose of synthroid.  6. Disposition:  Admit to [facility] for IV diuresis.   Signed, Richardson Dopp, PA-C  07/20/2013 12:50 PM

## 2013-07-20 NOTE — Patient Instructions (Signed)
PT ADMITTED TODAY TO [facility]; DX RIGHT SIDED HEART FAILURE

## 2013-07-20 NOTE — Progress Notes (Signed)
Pt arrived to 3E24 as direct admit from MD office.  Pt placed on telemetry and currently afib.  Vitals BP 124/70, HR 78, R 20, 96% on 2L/02 via [place], Temp 98.1.  IV team notified for IV start r/t to edema.  MD on call made aware of pt arrival.   Gae Gallop RN

## 2013-07-21 ENCOUNTER — Inpatient Hospital Stay (HOSPITAL_COMMUNITY): Payer: Medicare Other

## 2013-07-21 DIAGNOSIS — I5033 Acute on chronic diastolic (congestive) heart failure: Principal | ICD-10-CM

## 2013-07-21 DIAGNOSIS — N183 Chronic kidney disease, stage 3 unspecified: Secondary | ICD-10-CM

## 2013-07-21 DIAGNOSIS — E119 Type 2 diabetes mellitus without complications: Secondary | ICD-10-CM

## 2013-07-21 DIAGNOSIS — I509 Heart failure, unspecified: Secondary | ICD-10-CM

## 2013-07-21 LAB — COMPREHENSIVE METABOLIC PANEL
ALK PHOS: 98 U/L (ref 39–117)
ALT: 17 U/L (ref 0–35)
AST: 24 U/L (ref 0–37)
Albumin: 2.5 g/dL — ABNORMAL LOW (ref 3.5–5.2)
BUN: 38 mg/dL — ABNORMAL HIGH (ref 6–23)
CO2: 28 meq/L (ref 19–32)
Calcium: 8.9 mg/dL (ref 8.4–10.5)
Chloride: 102 mEq/L (ref 96–112)
Creatinine, Ser: 1.55 mg/dL — ABNORMAL HIGH (ref 0.50–1.10)
GFR calc non Af Amer: 31 mL/min — ABNORMAL LOW (ref >90)
GFR, EST AFRICAN AMERICAN: 36 mL/min — AB (ref >90)
GLUCOSE: 126 mg/dL — AB (ref 70–99)
Potassium: 4.3 mEq/L (ref 3.7–5.3)
Sodium: 142 mEq/L (ref 137–147)
Total Bilirubin: 0.4 mg/dL (ref 0.3–1.2)
Total Protein: 5.4 g/dL — ABNORMAL LOW (ref 6.0–8.3)

## 2013-07-21 LAB — GLUCOSE, CAPILLARY
GLUCOSE-CAPILLARY: 114 mg/dL — AB (ref 70–99)
GLUCOSE-CAPILLARY: 126 mg/dL — AB (ref 70–99)
Glucose-Capillary: 109 mg/dL — ABNORMAL HIGH (ref 70–99)

## 2013-07-21 LAB — BASIC METABOLIC PANEL
BUN: 38 mg/dL — ABNORMAL HIGH (ref 6–23)
CHLORIDE: 102 meq/L (ref 96–112)
CO2: 29 meq/L (ref 19–32)
Calcium: 9 mg/dL (ref 8.4–10.5)
Creatinine, Ser: 1.6 mg/dL — ABNORMAL HIGH (ref 0.50–1.10)
GFR calc Af Amer: 34 mL/min — ABNORMAL LOW (ref >90)
GFR calc non Af Amer: 30 mL/min — ABNORMAL LOW (ref >90)
Glucose, Bld: 127 mg/dL — ABNORMAL HIGH (ref 70–99)
Potassium: 4.3 mEq/L (ref 3.7–5.3)
Sodium: 142 mEq/L (ref 137–147)

## 2013-07-21 LAB — CBC WITH DIFFERENTIAL/PLATELET
BASOS PCT: 1 % (ref 0–1)
Basophils Absolute: 0 10*3/uL (ref 0.0–0.1)
EOS PCT: 5 % (ref 0–5)
Eosinophils Absolute: 0.2 10*3/uL (ref 0.0–0.7)
HCT: 29.6 % — ABNORMAL LOW (ref 36.0–46.0)
HEMOGLOBIN: 9.5 g/dL — AB (ref 12.0–15.0)
LYMPHS PCT: 18 % (ref 12–46)
Lymphs Abs: 0.8 10*3/uL (ref 0.7–4.0)
MCH: 30.4 pg (ref 26.0–34.0)
MCHC: 32.1 g/dL (ref 30.0–36.0)
MCV: 94.9 fL (ref 78.0–100.0)
Monocytes Absolute: 0.4 10*3/uL (ref 0.1–1.0)
Monocytes Relative: 8 % (ref 3–12)
NEUTROS ABS: 3 10*3/uL (ref 1.7–7.7)
Neutrophils Relative %: 68 % (ref 43–77)
Platelets: 157 10*3/uL (ref 150–400)
RBC: 3.12 MIL/uL — ABNORMAL LOW (ref 3.87–5.11)
RDW: 15.1 % (ref 11.5–15.5)
Smear Review: ADEQUATE
WBC: 4.4 10*3/uL (ref 4.0–10.5)

## 2013-07-21 LAB — MRSA PCR SCREENING: MRSA BY PCR: NEGATIVE

## 2013-07-21 LAB — APTT: APTT: 29 s (ref 24–37)

## 2013-07-21 LAB — PROTIME-INR
INR: 1.19 (ref 0.00–1.49)
Prothrombin Time: 14.8 seconds (ref 11.6–15.2)

## 2013-07-21 LAB — PRO B NATRIURETIC PEPTIDE: Pro B Natriuretic peptide (BNP): 4244 pg/mL — ABNORMAL HIGH (ref 0–450)

## 2013-07-21 MED ORDER — METOLAZONE 5 MG PO TABS
5.0000 mg | ORAL_TABLET | Freq: Every day | ORAL | Status: DC
  Administered 2013-07-21 – 2013-07-22 (×2): 5 mg via ORAL
  Filled 2013-07-21 (×2): qty 1

## 2013-07-21 MED ORDER — FUROSEMIDE 10 MG/ML IJ SOLN
80.0000 mg | Freq: Three times a day (TID) | INTRAMUSCULAR | Status: DC
  Administered 2013-07-21 – 2013-07-26 (×17): 80 mg via INTRAVENOUS
  Filled 2013-07-21 (×20): qty 8

## 2013-07-21 NOTE — Progress Notes (Signed)
Cardiologist: Dr. Minus Breeding   Subjective:  Sleepy, feels tired. Legs hurt  Objective:  Vital Signs in the last 24 hours: Temp:  [97.7 F (36.5 C)-98.1 F (36.7 C)] 97.7 F (36.5 C) (01/24 0529) Pulse Rate:  [63-102] 102 (01/24 0913) Resp:  [20] 20 (01/24 0529) BP: (109-124)/(32-82) 119/79 mmHg (01/24 0913) SpO2:  [96 %-100 %] 100 % (01/24 0529) Weight:  [257 lb 15 oz (117 kg)-258 lb 9.6 oz (117.3 kg)] 258 lb 9.6 oz (117.3 kg) (01/24 0529)  Intake/Output from previous day: 01/23 0701 - 01/24 0700 In: 104 [P.O.:540] Out: 650 [Urine:650]   Physical Exam: General: Well developed, well nourished, in no acute distress. Head:  Normocephalic and atraumatic. Lungs: Distant BS, no wheeze. Heart: Irreg irreg brady 2.6 SM LLSB no rubs or gallops. +JVD Abdomen: soft, non-tender, positive bowel sounds. obese Extremities: No clubbing or cyanosis. Anasarca, diffuse edema, LE wraps.  Neurologic: Alert and oriented x 3.    Lab Results:  Recent Labs  07/21/13 0415  WBC 4.4  HGB 9.5*  PLT 157    Recent Labs  07/21/13 0415  NA 142  142  K 4.3  4.3  CL 102  102  CO2 29  28  GLUCOSE 127*  126*  BUN 38*  38*  CREATININE 1.60*  1.55*    Hepatic Function Panel  Recent Labs  07/21/13 0415  PROT 5.4*  ALBUMIN 2.5*  AST 24  ALT 17  ALKPHOS 98  BILITOT 0.4   Imaging: Dg Chest 2 View  07/21/2013   CLINICAL DATA:  Anasarca, dyspnea  EXAM: CHEST  2 VIEW  COMPARISON:  Prior chest x-ray 05/10/2013; CT abdomen/pelvis 05/11/2013  FINDINGS: Marked enlargement of the cardiopericardial silhouette. Unchanged mediastinal contours. Moderate right slightly larger than left bilateral layering pleural effusions with associated bibasilar opacities. Pulmonary vascular congestion with mild edema. No pneumothorax. Osseous structures are intact and unremarkable for age.  IMPRESSION: 1. Right greater than left layering pleural effusions with associated bibasilar opacities favored to  reflect atelectasis. 2. Pulmonary vascular congestion with perhaps mild interstitial edema. 3. Marked cardiomegaly.   Electronically Signed   By: Jacqulynn Cadet M.D.   On: 07/21/2013 08:23   Personally viewed.   Telemetry: AFIB 50's Personally viewed.   EKG:  AFIB RBBB  Cardiac Studies:  ECHO 05/11/13: - Left ventricle: The cavity size was normal. Wall thickness was increased in a pattern of mild LVH. Systolic function was normal. The estimated ejection fraction was in the range of 60% to 65%. Wall motion was normal; there were no regional wall motion abnormalities. Indeterminant diastolic function (atrial fibrillation). - Aortic valve: There was no stenosis. - Mitral valve: Mildly calcified annulus. Normal thickness leaflets . Mild regurgitation. - Left atrium: The atrium was moderately dilated. - Right ventricle: The cavity size was mildly dilated. Systolic function was normal. - Right atrium: The atrium was moderately dilated. - Tricuspid valve: Moderate regurgitation. Peak RV-RA gradient: 39mm Hg (S). - Pulmonary arteries: PA peak pressure: 47mm Hg (S). - Systemic veins: IVC measured 2.6 cm with some respirophasic variation, suggesting RA pressure 15 mmHg. Impressions:  - The patient was in atrial fibrillation. Normal LV size and systolic function, EF 86-76%. Mild LV hypertrophy. Mildly dilated LV with normal systolic function. Moderate biatrial enlargement. Moderate TR. Moderate pulmonary hypertension.  Marland Kitchen atorvastatin  20 mg Oral q1800  . cholecalciferol  1,000 Units Oral Daily  . citalopram  20 mg Oral Daily  . collagenase   Topical Daily  .  diltiazem  120 mg Oral Daily  . enoxaparin (LOVENOX) injection  40 mg Subcutaneous Q24H  . feeding supplement (ENSURE)  1 Container Oral TID BM  . ferrous gluconate  324 mg Oral Q breakfast  . furosemide  80 mg Intravenous TID  . hydrALAZINE  50 mg Oral TID  . labetalol  300 mg Oral BID  . [START ON 07/22/2013] levothyroxine   25 mcg Oral QAC breakfast  . metolazone  5 mg Oral Daily  . multivitamin with minerals  1 tablet Oral Daily  . potassium chloride  20 mEq Oral BID  . sodium bicarbonate  650 mg Oral TID  . sodium chloride  3 mL Intravenous Q12H  . zinc sulfate  220 mg Oral Daily    Assessment/Plan:   80 year old with anasarca, acute on chronic diastolic heart failure, secondary pulmonary hypertension, right sided HF, morbid obesity  1) Acute on chronic diastolic HF with superimposed pulmonary HTN  - Increased lasix to 80 IV TID  - Metolazone 5mg  (start 07/21/13)  - Watch lytes  - not very good output yesterday  - Massive fluid overload  2) CKD 3   - watch creat with diuresis  3) Morbid obesity  - extreme deconditioning  4) Right sided HF - as above.   5) AFIB  - Not able to take coumadin because of rectus sheath bleed.  - Rate controlled. Dilt and labetalol  6) HTN - stable  7) HL - statin  Very complex pt.    Maleke Feria, [place] 07/21/2013, 2:06 PM

## 2013-07-21 NOTE — Evaluation (Signed)
Physical Therapy Evaluation Patient Details Name: [name] MRN: [id number] DOB: 1932-04-23 Today's Date: 07/21/2013 Time: 9476-5465 PT Time Calculation (min): 16 min  PT Assessment / Plan / Recommendation History of Present Illness  Pt admit with anasarca.  Clinical Impression  Pt admitted with above. Pt currently with functional limitations due to the deficits listed below (see PT Problem List). Pt was lifted with mechanical lift at NH per pt.  Will give trial of PT to address sitting balance and to try and strengthen UE and LEs.   Will need NHP at d/c.  Pt will benefit from skilled PT to increase their independence and safety with mobility to allow discharge to the venue listed below.     PT Assessment  Patient needs continued PT services    Follow Up Recommendations  SNF;Supervision/Assistance - 24 hour        Barriers to Discharge Decreased caregiver support      Equipment Recommendations  Other (comment) (TBA)         Frequency Min 2X/week    Precautions / Restrictions Precautions Precautions: Fall Restrictions Weight Bearing Restrictions: No   Pertinent Vitals/Pain VSS, Pain all 4 extremities per pt.  Nursing aware and pt has had meds.       Mobility  Bed Mobility Overal bed mobility: Needs Assistance;+2 for physical assistance Bed Mobility: Rolling Rolling: Total assist;+2 for physical assistance General bed mobility comments: Pt unable to assist with rolling requiring total assist.         PT Diagnosis: Generalized weakness;Acute pain  PT Problem List: Decreased strength;Decreased activity tolerance;Decreased range of motion;Decreased balance;Decreased mobility;Decreased knowledge of use of DME;Decreased safety awareness;Pain;Decreased skin integrity PT Treatment Interventions: DME instruction;Functional mobility training;Therapeutic activities;Therapeutic exercise;Balance training;Patient/family education     PT Goals(Current goals can be found in the  care plan section) Acute Rehab PT Goals Patient Stated Goal: to go to NH PT Goal Formulation: With patient Time For Goal Achievement: 08/04/13 Potential to Achieve Goals: Fair  Visit Information  Last PT Received On: 07/21/13 Assistance Needed: +2 History of Present Illness: Pt admit with anasarca.       Prior [place] expects to be discharged to:: Skilled nursing facility Prior Function Level of Independence: Needs assistance Gait / Transfers Assistance Needed: Transfer to wheelchair with 2 person assist with mechanical lift per pt; pt could not propel wheelchair ADL's / Homemaking Assistance Needed: total assist for care.  Pt states she had difficulty feeding herself.   Communication Communication: No difficulties Dominant Hand: Left    Cognition  Cognition Arousal/Alertness: Awake/alert Behavior During Therapy: WFL for tasks assessed/performed Overall Cognitive Status: History of cognitive impairments - at baseline Area of Impairment: Orientation;Following commands;Safety/judgement;Awareness;Problem solving Orientation Level: Place;Time;Situation (stated she was at school) Following Commands: Follows one step commands with increased time Safety/Judgement: Decreased awareness of safety;Decreased awareness of deficits Awareness: Intellectual Problem Solving: Decreased initiation;Difficulty sequencing;Requires verbal cues;Requires tactile cues    Extremity/Trunk Assessment Upper Extremity Assessment Upper Extremity Assessment: Defer to OT evaluation (LUE weaker than RUE) Lower Extremity Assessment Lower Extremity Assessment: RLE deficits/detail;LLE deficits/detail RLE Deficits / Details: Wrapped in coban with weeping; tried to assist pt with movement and pt too painful RLE: Unable to fully assess due to pain LLE Deficits / Details: wrapped in coban with weeping; unable to provide ROm due to pain with movement. LLE: Unable to fully assess due  to pain   Balance General Comments General comments (skin integrity, edema, etc.): Pt with a lot of  edema in bil UEs and LEs.  All extremities propped on pillows.    End of Session PT - End of Session Equipment Utilized During Treatment: Oxygen Activity Tolerance: Patient limited by pain;Patient limited by fatigue Patient left: in bed;with call bell/phone within reach Nurse Communication: Mobility status;Need for lift equipment (asked nursing to use lift and get pt up when able )       INGOLD,Remington Skalsky 07/21/2013, 10:36 AM [facility] Acute Rehabilitation [phone] (pager)

## 2013-07-22 ENCOUNTER — Inpatient Hospital Stay (HOSPITAL_COMMUNITY): Payer: Medicare Other

## 2013-07-22 LAB — BASIC METABOLIC PANEL
BUN: 37 mg/dL — AB (ref 6–23)
CALCIUM: 9.1 mg/dL (ref 8.4–10.5)
CO2: 29 mEq/L (ref 19–32)
CREATININE: 1.48 mg/dL — AB (ref 0.50–1.10)
Chloride: 100 mEq/L (ref 96–112)
GFR calc Af Amer: 38 mL/min — ABNORMAL LOW (ref >90)
GFR, EST NON AFRICAN AMERICAN: 32 mL/min — AB (ref >90)
GLUCOSE: 114 mg/dL — AB (ref 70–99)
POTASSIUM: 5.1 meq/L (ref 3.7–5.3)
Sodium: 141 mEq/L (ref 137–147)

## 2013-07-22 LAB — GLUCOSE, CAPILLARY
GLUCOSE-CAPILLARY: 138 mg/dL — AB (ref 70–99)
Glucose-Capillary: 131 mg/dL — ABNORMAL HIGH (ref 70–99)
Glucose-Capillary: 109 mg/dL — ABNORMAL HIGH (ref 70–99)
Glucose-Capillary: 127 mg/dL — ABNORMAL HIGH (ref 70–99)

## 2013-07-22 MED ORDER — METOLAZONE 5 MG PO TABS
5.0000 mg | ORAL_TABLET | Freq: Two times a day (BID) | ORAL | Status: DC
  Administered 2013-07-22 – 2013-07-26 (×9): 5 mg via ORAL
  Filled 2013-07-22 (×13): qty 1

## 2013-07-22 MED ORDER — DILTIAZEM HCL ER COATED BEADS 240 MG PO CP24: 240.0000 mg | ORAL_CAPSULE | Freq: Every day | ORAL | Status: DC

## 2013-07-22 MED ORDER — DILTIAZEM HCL ER COATED BEADS 180 MG PO CP24
180.0000 mg | ORAL_CAPSULE | Freq: Every day | ORAL | Status: DC
  Administered 2013-07-23 – 2013-08-01 (×10): 180 mg via ORAL
  Filled 2013-07-22 (×11): qty 1

## 2013-07-22 MED ORDER — DILTIAZEM HCL ER COATED BEADS 120 MG PO CP24
120.0000 mg | ORAL_CAPSULE | Freq: Once | ORAL | Status: DC
  Filled 2013-07-22: qty 1

## 2013-07-22 MED ORDER — SODIUM CHLORIDE 0.9 % IJ SOLN
10.0000 mL | INTRAMUSCULAR | Status: DC | PRN
  Administered 2013-07-22 – 2013-07-28 (×4): 10 mL
  Administered 2013-07-30: 20 mL
  Administered 2013-08-01: 10 mL

## 2013-07-22 NOTE — Progress Notes (Signed)
Called by RN for patient with low BP, 66/42 manual.  Upon arrival to patients room, RN at bedside.  Patient with 4+edema all over, nasal cannula NAD noted.  Patient is alert and oriented. Denies dizziness, headache, pain. I rechecked BP 124/81.  RN to call if assistance needed

## 2013-07-22 NOTE — Progress Notes (Addendum)
Patient ID: ELORA WOLTER, female   DOB: 03/17/1932, 80 y.o.   MRN: [id number]    Subjective:    Still with SOB. No chest pain, no palpitations  Objective:   Temp:  [97.1 F (36.2 C)-98 F (36.7 C)] 98 F (36.7 C) (01/25 0500) Pulse Rate:  [74-100] 100 (01/25 1311) Resp:  [19-20] 19 (01/25 0500) BP: (116-154)/(60-92) 154/92 mmHg (01/25 1311) SpO2:  [94 %-100 %] 94 % (01/25 0500) Weight:  [257 lb 15 oz (117 kg)] 257 lb 15 oz (117 kg) (01/25 0500) Last BM Date: 07/20/13  Filed Weights   07/20/13 1550 07/21/13 0529 07/22/13 0500  Weight: 257 lb 15 oz (117 kg) 258 lb 9.6 oz (117.3 kg) 257 lb 15 oz (117 kg)    Intake/Output Summary (Last 24 hours) at 07/22/13 1353 Last data filed at 07/22/13 1252  Gross per 24 hour  Intake    480 ml  Output   1951 ml  Net  -1471 ml    Telemetry: afib, elevated rates 120s  Exam:  General:NAD  Resp:Bilateral expiratory wheeze, crackles  Cardiac: irreg, rate 110, no m/r/g  JK:DTOIZTI soft, NT, ND  MSK: : diffuse 2-3+ edema upper and lower extremities  Neuro: no focal deficits   Lab Results:  Basic Metabolic Panel:  Recent Labs Lab 07/21/13 0415 07/22/13 0600  NA 142  142 141  K 4.3  4.3 5.1  CL 102  102 100  CO2 29  28 29   GLUCOSE 127*  126* 114*  BUN 38*  38* 37*  CREATININE 1.60*  1.55* 1.48*  CALCIUM 9.0  8.9 9.1    Liver Function Tests:  Recent Labs Lab 07/21/13 0415  AST 24  ALT 17  ALKPHOS 98  BILITOT 0.4  PROT 5.4*  ALBUMIN 2.5*    CBC:  Recent Labs Lab 07/21/13 0415  WBC 4.4  HGB 9.5*  HCT 29.6*  MCV 94.9  PLT 157    Cardiac Enzymes: No results found for this basename: CKTOTAL, CKMB, CKMBINDEX, TROPONINI,  in the last 168 hours  BNP:  Recent Labs  01/29/13 0530 05/10/13 1525 07/21/13 0415  PROBNP 3513.0* 3105.0* 4244.0*    Coagulation:  Recent Labs Lab 07/21/13 0415  INR 1.19    Medications:   Scheduled Medications: . atorvastatin  20 mg Oral q1800  .  cholecalciferol  1,000 Units Oral Daily  . citalopram  20 mg Oral Daily  . collagenase   Topical Daily  . diltiazem  120 mg Oral Daily  . enoxaparin (LOVENOX) injection  40 mg Subcutaneous Q24H  . feeding supplement (ENSURE)  1 Container Oral TID BM  . ferrous gluconate  324 mg Oral Q breakfast  . furosemide  80 mg Intravenous TID  . hydrALAZINE  50 mg Oral TID  . labetalol  300 mg Oral BID  . levothyroxine  25 mcg Oral QAC breakfast  . metolazone  5 mg Oral Daily  . multivitamin with minerals  1 tablet Oral Daily  . sodium bicarbonate  650 mg Oral TID  . sodium chloride  3 mL Intravenous Q12H  . zinc sulfate  220 mg Oral Daily     Infusions:     PRN Medications:  sodium chloride, acetaminophen, ALPRAZolam, docusate sodium, ondansetron (ZOFRAN) IV, ondansetron, oxyCODONE, sodium chloride, sodium chloride     Assessment/Plan   80 yo with anasarca, acute on chronic diastolic heart failure, pulm HTN, right sided HF, and morbid obesity.    1) Acute on chronic diastolic HF  with superimposed pulmonary HTN  - Increased lasix to 80 IV TID, started on metolazone 5mg  (start 07/21/13)  - net negative 730 mL, negative 1.4 liters total. Cr trending down with diuresis.  - still without significant diuresis, increase metolazone to 5mg  bid  2) CKD 3  - Cr trending down with diuresis consistent with venous congestion  3) Afib - Not able to take coumadin because of rectus sheath bleed.  - Elevated rates overnight and this morning, increase daily cardizem to 180mg  daily.            Carlyle Dolly, M.D., F.A.C.C.

## 2013-07-22 NOTE — Progress Notes (Signed)
Peripherally Inserted Central Catheter/Midline Placement  The IV Nurse has discussed with the patient and/or persons authorized to consent for the patient, the purpose of this procedure and the potential benefits and risks involved with this procedure.  The benefits include less needle sticks, lab draws from the catheter and patient may be discharged home with the catheter.  Risks include, but not limited to, infection, bleeding, blood clot (thrombus formation), and puncture of an artery; nerve damage and irregular heat beat.  Alternatives to this procedure were also discussed.  PICC/Midline Placement Documentation        [name] 07/22/2013, 10:49 AM

## 2013-07-22 NOTE — Progress Notes (Signed)
Pt. Became SOB and when NT checked her B/P on Dinamap on her Forearm it was 56/14, B/P was rechecked manually on Forearm and it was 66/42, Rapid response and MD were notified,Rapid Response nurse stated to possibly give a 250 Bolus but once she came pt. Was too edematous to give the bolus and was alert and oriented and after oxygen increased pt. Was breathing better and Sats were in high 90's and 100. Pt. Was only given 50 mg of Lasix once B/P reading was done. Charge Nurse notified P.A. On call. Pt. Stabilized and Rapid Response nurse rechecked pressure on upper arm and B/P was 124/81. Pt. Oriented X3. Pt. Remained stable throughout remainder of my shift. Pt.'s hydralazine was not given.

## 2013-07-23 ENCOUNTER — Other Ambulatory Visit: Payer: Self-pay | Admitting: *Deleted

## 2013-07-23 LAB — GLUCOSE, CAPILLARY
GLUCOSE-CAPILLARY: 110 mg/dL — AB (ref 70–99)
GLUCOSE-CAPILLARY: 123 mg/dL — AB (ref 70–99)
Glucose-Capillary: 136 mg/dL — ABNORMAL HIGH (ref 70–99)
Glucose-Capillary: 162 mg/dL — ABNORMAL HIGH (ref 70–99)
Glucose-Capillary: 119 mg/dL — ABNORMAL HIGH (ref 70–99)

## 2013-07-23 LAB — BASIC METABOLIC PANEL
BUN: 38 mg/dL — ABNORMAL HIGH (ref 6–23)
CO2: 34 mEq/L — ABNORMAL HIGH (ref 19–32)
Calcium: 9.4 mg/dL (ref 8.4–10.5)
Chloride: 97 mEq/L (ref 96–112)
Creatinine, Ser: 1.56 mg/dL — ABNORMAL HIGH (ref 0.50–1.10)
GFR, EST AFRICAN AMERICAN: 35 mL/min — AB (ref >90)
GFR, EST NON AFRICAN AMERICAN: 30 mL/min — AB (ref >90)
GLUCOSE: 119 mg/dL — AB (ref 70–99)
POTASSIUM: 3.7 meq/L (ref 3.7–5.3)
SODIUM: 143 meq/L (ref 137–147)

## 2013-07-23 MED ORDER — HYDROCODONE-ACETAMINOPHEN 7.5-325 MG PO TABS: ORAL_TABLET | ORAL | Status: DC

## 2013-07-23 MED ORDER — PRO-STAT SUGAR FREE PO LIQD
30.0000 mL | Freq: Two times a day (BID) | ORAL | Status: DC
  Administered 2013-07-23 – 2013-08-01 (×18): 30 mL via ORAL
  Filled 2013-07-23 (×20): qty 30

## 2013-07-23 NOTE — Progress Notes (Signed)
INITIAL NUTRITION ASSESSMENT  DOCUMENTATION CODES Per approved criteria  -Not Applicable   INTERVENTION: Continue Ensure Pudding TID Provide Pro-stat BID Continue Multivitamin with minerals daily Encourage PO intake  NUTRITION DIAGNOSIS: Increased nutrient needs related to wound healing and severe edema as evidenced by pt's chart.  Goal: Pt to meet >/= 90% of their estimated nutrition needs   Monitor:  PO intake Weight trends Labs  Reason for Assessment: Low Braden  80 y.o. female  Admitting Dx: Acute on chronic diastolic heart failure  ASSESSMENT: 80 y.o. female with a h/o AFib, pulmonary HTN, right sided heart failure, DM2, HTN, HL and CKD. She lives at [facility]. Pt with significant LE edema with significant desquamation of bilateral LEs.   Pt with noticeable severe edema in face, hands and arms. Pt states that her appetite usually varies- some days she eats well and other days she eats very little. Pt reports eating well so far this admission. Per nursing notes, pt is eating 65-100% of meals. Pt is 96 lbs above usual body weight and weight is up 76 lbs from one month ago.   Height: Ht Readings from Last 1 Encounters:  07/20/13 5\' 5"  (1.651 m)    Weight: Wt Readings from Last 1 Encounters:  07/23/13 259 lb 11.2 oz (117.8 kg)    Ideal Body Weight: 125 lbs  % Ideal Body Weight: 207%  Wt Readings from Last 10 Encounters:  07/23/13 259 lb 11.2 oz (117.8 kg)  06/26/13 183 lb (83.008 kg)  05/14/13 186 lb 11.7 oz (84.7 kg)  04/25/13 184 lb 1.4 oz (83.5 kg)  04/05/13 178 lb (80.74 kg)  02/13/13 189 lb 2 oz (85.787 kg)  01/27/13 189 lb 2.5 oz (85.8 kg)  11/28/12 163 lb (73.936 kg)  07/13/12 163 lb (73.936 kg)  03/23/12 160 lb 6.4 oz (72.757 kg)    Usual Body Weight: 163 lbs (reported at previous admission)  % Usual Body Weight: 159%  BMI:  Body mass index is 43.22 kg/(m^2).  Estimated Nutritional Needs: Kcal: 1600-1800  Protein: 105-120 grams   Fluid: 1.8- 2 L/day  Skin: +4 Generalized edema; stage 2 pressure ulcer on left buttocks; stage 2 pressure ulcer on right thigh  Diet Order: Cardiac  EDUCATION NEEDS: -No education needs identified at this time   Intake/Output Summary (Last 24 hours) at 07/23/13 1201 Last data filed at 07/23/13 1000  Gross per 24 hour  Intake    720 ml  Output   4776 ml  Net  -4056 ml    Last BM: 1/25  Labs:   Recent Labs Lab 07/21/13 0415 07/22/13 0600 07/23/13 0443  NA 142  142 141 143  K 4.3  4.3 5.1 3.7  CL 102  102 100 97  CO2 29  28 29  34*  BUN 38*  38* 37* 38*  CREATININE 1.60*  1.55* 1.48* 1.56*  CALCIUM 9.0  8.9 9.1 9.4  GLUCOSE 127*  126* 114* 119*    CBG (last 3)   Recent Labs  07/22/13 1602 07/22/13 2110 07/23/13 0607  GLUCAP 138* 136* 110*    Scheduled Meds: . atorvastatin  20 mg Oral q1800  . cholecalciferol  1,000 Units Oral Daily  . citalopram  20 mg Oral Daily  . collagenase   Topical Daily  . diltiazem  180 mg Oral Daily  . enoxaparin (LOVENOX) injection  40 mg Subcutaneous Q24H  . feeding supplement (ENSURE)  1 Container Oral TID BM  . ferrous gluconate  324 mg  Oral Q breakfast  . furosemide  80 mg Intravenous TID  . hydrALAZINE  50 mg Oral TID  . labetalol  300 mg Oral BID  . levothyroxine  25 mcg Oral QAC breakfast  . metolazone  5 mg Oral BID  . multivitamin with minerals  1 tablet Oral Daily  . sodium bicarbonate  650 mg Oral TID  . sodium chloride  3 mL Intravenous Q12H  . zinc sulfate  220 mg Oral Daily    Continuous Infusions:   Past Medical History  Diagnosis Date  . Obesity   . Anemia, iron deficiency   . Depression   . Diabetes mellitus type II   . Hypertension   . Peptic ulcer   . History of uterine fibroid   . Hx of colonic polyps   . Diverticula, colon   . Osteoarthritis   . Low back pain   . Pulmonary hypertension     a. echo 4/12:  EF 55-60%, mod LVH, mild BAE, RVE and PASP 51;  b.  Echo (04/2013):  Mild  LVH, EF 60-65%, mild MAC, mild MR, mod LAE, mild RVE, normal RVF, mod RAE, mod TR, PASP 65  . Anxiety   . HYPERLIPIDEMIA 03/05/2007  . ANEMIA-IRON DEFICIENCY 03/05/2007  . Right-sided heart failure 03/05/2007  . LOW BACK PAIN 03/05/2007  . Chronic pain syndrome 06/23/2010  . Hypothyroidism 10/15/2010  . CKD (chronic kidney disease) stage 3, GFR 30-59 ml/min 10/15/2010  . Anemia of chronic disease 05/11/2013  . Atrial fibrillation     permanent, on coumadin;  coumadin stopped at time of spontaneous rectus sheat hematoma 04/2013  . Anasarca   . CHF (congestive heart failure)     Past Surgical History  Procedure Laterality Date  . Laparoscopic hysterectomy    . Colonoscopy  2009    Pryor Ochoa RD, LDN Inpatient Clinical Dietitian Pager: [phone] After Hours Pager: [phone]

## 2013-07-23 NOTE — Progress Notes (Signed)
Pt had refused ensure pudding but did take prostat

## 2013-07-23 NOTE — Progress Notes (Signed)
Subjective:  Moaning, "afraid to cough, it hurts"  Objective:  Vital Signs in the last 24 hours: Temp:  [97.9 F (36.6 C)-98.7 F (37.1 C)] 98.4 F (36.9 C) (01/26 0425) Pulse Rate:  [90-110] 95 (01/26 0425) Resp:  [20] 20 (01/26 0425) BP: (100-154)/(28-102) 128/77 mmHg (01/26 0425) SpO2:  [100 %] 100 % (01/26 0425) Weight:  [259 lb 11.2 oz (117.8 kg)] 259 lb 11.2 oz (117.8 kg) (01/26 0425)  Intake/Output from previous day:  Intake/Output Summary (Last 24 hours) at 07/23/13 1020 Last data filed at 07/23/13 0533  Gross per 24 hour  Intake    480 ml  Output   3776 ml  Net  -3296 ml    Physical Exam: General appearance: alert, cooperative, mild distress, morbidly obese and poor dentition Lungs: decreased breath sounds with scattered rhonchi and wheezing Heart: irregularly irregular rhythm   Rate: 90  Rhythm: sinus arrhythmia  Lab Results:  Recent Labs  07/21/13 0415  WBC 4.4  HGB 9.5*  PLT 157    Recent Labs  07/22/13 0600 07/23/13 0443  NA 141 143  K 5.1 3.7  CL 100 97  CO2 29 34*  GLUCOSE 114* 119*  BUN 37* 38*  CREATININE 1.48* 1.56*   No results found for this basename: TROPONINI, CK, MB,  in the last 72 hours  Recent Labs  07/21/13 0415  INR 1.19    Imaging: Imaging results have been reviewed PICC line placed  Cardiac Studies:  Assessment/Plan:   Principal Problem:   Acute on chronic diastolic heart failure Active Problems:   Right heart failure   Anasarca   HYPERTENSION   PULMONARY HYPERTENSION   Chronic atrial fibrillation   Chronic renal insufficiency, stage III (moderate)   Diabetes mellitus with diabetic nephropathy   HYPERLIPIDEMIA   Hypothyroidism   Failure to thrive- SNF pt   Rectus sheath hematoma 11/14- Coumadin stopped   Anemia in chronic kidney disease(285.21)   Morbid obesity    PLAN: Massive fluid overload with anasarca. She appears significantly debilitated and immobile. I/O negative but she doesn't have a  foley. Her wgt has gone up 2lbs since admission. Lasix increased to 80 mg IV TID and Zaroxolyn to BID.   Kerin Ransom PA-C Beeper [phone] 07/23/2013, 10:20 AM  I have personally seen and examined this patient. I agree with the assessment and plan as outlined above. She is still massively volume overloaded with anasarca, upper and lower ext edema. Renal function stable with diuresis. Continue current diuretic regimen today. Recheck BMET in am. LV systolic function is normal by echo November 2014.   Dahiana Kulak 07/23/2013 10:49 AM

## 2013-07-24 DIAGNOSIS — E876 Hypokalemia: Secondary | ICD-10-CM | POA: Diagnosis not present

## 2013-07-24 LAB — BASIC METABOLIC PANEL
BUN: 38 mg/dL — ABNORMAL HIGH (ref 6–23)
CO2: 35 mEq/L — ABNORMAL HIGH (ref 19–32)
Calcium: 9.3 mg/dL (ref 8.4–10.5)
Chloride: 95 mEq/L — ABNORMAL LOW (ref 96–112)
Creatinine, Ser: 1.51 mg/dL — ABNORMAL HIGH (ref 0.50–1.10)
GFR calc Af Amer: 37 mL/min — ABNORMAL LOW (ref >90)
GFR calc non Af Amer: 32 mL/min — ABNORMAL LOW (ref >90)
Glucose, Bld: 107 mg/dL — ABNORMAL HIGH (ref 70–99)
Potassium: 3.3 mEq/L — ABNORMAL LOW (ref 3.7–5.3)
Sodium: 141 mEq/L (ref 137–147)

## 2013-07-24 MED ORDER — POTASSIUM CHLORIDE CRYS ER 20 MEQ PO TBCR
20.0000 meq | EXTENDED_RELEASE_TABLET | Freq: Two times a day (BID) | ORAL | Status: DC
  Administered 2013-07-24 – 2013-07-25 (×4): 20 meq via ORAL
  Filled 2013-07-24 (×6): qty 1

## 2013-07-24 MED ORDER — POTASSIUM CHLORIDE CRYS ER 20 MEQ PO TBCR: 40.0000 meq | EXTENDED_RELEASE_TABLET | Freq: Two times a day (BID) | ORAL | Status: DC

## 2013-07-24 NOTE — Progress Notes (Signed)
Subjective:  C/O pain "my legs"  Objective:  Vital Signs in the last 24 hours: Temp:  [98.1 F (36.7 C)-98.8 F (37.1 C)] 98.8 F (37.1 C) (01/27 0500) Pulse Rate:  [76-94] 76 (01/26 2026) Resp:  [20-22] 20 (01/26 2026) BP: (127-142)/(68-90) 142/90 mmHg (01/27 0500) SpO2:  [97 %-100 %] 100 % (01/26 2026) Weight:  [248 lb 0.3 oz (112.5 kg)] 248 lb 0.3 oz (112.5 kg) (01/27 0500)  Intake/Output from previous day:  Intake/Output Summary (Last 24 hours) at 07/24/13 0829 Last data filed at 07/24/13 5170  Gross per 24 hour  Intake    720 ml  Output   5125 ml  Net  -4405 ml    Physical Exam: General appearance: alert, cooperative, no distress and morbidly obese Lungs: decreased breath sounds Heart: irregularly irregular rhythm Extremities: bilat warps in place, her toes are warm to touch   Rate: 70  Rhythm: atrial fibrillation  Lab Results: No results found for this basename: WBC, HGB, PLT,  in the last 72 hours  Recent Labs  07/23/13 0443 07/24/13 0457  NA 143 141  K 3.7 3.3*  CL 97 95*  CO2 34* 35*  GLUCOSE 119* 107*  BUN 38* 38*  CREATININE 1.56* 1.51*   No results found for this basename: TROPONINI, CK, MB,  in the last 72 hours No results found for this basename: INR,  in the last 72 hours  . atorvastatin  20 mg Oral q1800  . cholecalciferol  1,000 Units Oral Daily  . citalopram  20 mg Oral Daily  . collagenase   Topical Daily  . diltiazem  180 mg Oral Daily  . enoxaparin (LOVENOX) injection  40 mg Subcutaneous Q24H  . feeding supplement (ENSURE)  1 Container Oral TID BM  . feeding supplement (PRO-STAT SUGAR FREE 64)  30 mL Oral BID AC  . ferrous gluconate  324 mg Oral Q breakfast  . furosemide  80 mg Intravenous TID  . hydrALAZINE  50 mg Oral TID  . labetalol  300 mg Oral BID  . levothyroxine  25 mcg Oral QAC breakfast  . metolazone  5 mg Oral BID  . multivitamin with minerals  1 tablet Oral Daily  . potassium chloride  20 mEq Oral BID  . sodium  bicarbonate  650 mg Oral TID  . sodium chloride  3 mL Intravenous Q12H  . zinc sulfate  220 mg Oral Daily    Imaging: Imaging results have been reviewed  Cardiac Studies: Echo from 11/14- Left ventricle: The cavity size was normal. Wall thickness was increased in a pattern of mild LVH. Systolic function was normal. The estimated ejection fraction was in the range of 60% to 65%. Wall motion was normal; there were no regional wall motion abnormalities. Indeterminant diastolic function (atrial fibrillation). - Aortic valve: There was no stenosis.   Assessment/Plan:   Principal Problem:   Acute on chronic diastolic heart failure- EF 60-65% 11/14 Active Problems:   Right heart failure   Anasarca   HYPERTENSION   PULMONARY HYPERTENSION- PA pk 65 mmHg 11/14   Chronic atrial fibrillation   Chronic renal insufficiency, stage III (moderate)   Diabetes mellitus with diabetic nephropathy   Hypokalemia   HYPERLIPIDEMIA   Hypothyroidism   Failure to thrive- SNF pt   Rectus sheath hematoma 11/14- Coumadin stopped   Anemia in chronic kidney disease(285.21)   Morbid obesity    PLAN:  Start K+ supplement, check Mg++ in am. She is diuresing on current regimen, wgt  down 9 lbs, still a ways to go. Check stools, she is on Iron supplement.   [name] Ransom [name] Beeper [phone] 07/24/2013, 8:29 AM  I have personally seen and examined this patient with [name] Ransom, [name]. I agree with the assessment and plan as outlined above. Pt with acute on chronic diastolic CHF and massive volume overload/ascites. Diuresing well. Still volume overloaded. Continue IV Lasix. Potassium supplementation.   Andreina Outten 9:58 AM 07/24/2013

## 2013-07-24 NOTE — Progress Notes (Signed)
Utilization Review Completed Jermall Isaacson J. Lela Gell, RN, BSN, NCM [phone]  

## 2013-07-25 DIAGNOSIS — L03119 Cellulitis of unspecified part of limb: Secondary | ICD-10-CM

## 2013-07-25 DIAGNOSIS — L02419 Cutaneous abscess of limb, unspecified: Secondary | ICD-10-CM

## 2013-07-25 LAB — GLUCOSE, CAPILLARY: Glucose-Capillary: 114 mg/dL — ABNORMAL HIGH (ref 70–99)

## 2013-07-25 LAB — BASIC METABOLIC PANEL
BUN: 40 mg/dL — ABNORMAL HIGH (ref 6–23)
CO2: 35 mEq/L — ABNORMAL HIGH (ref 19–32)
Calcium: 9.5 mg/dL (ref 8.4–10.5)
Chloride: 95 mEq/L — ABNORMAL LOW (ref 96–112)
Creatinine, Ser: 1.65 mg/dL — ABNORMAL HIGH (ref 0.50–1.10)
GFR calc Af Amer: 33 mL/min — ABNORMAL LOW (ref >90)
GFR calc non Af Amer: 28 mL/min — ABNORMAL LOW (ref >90)
Glucose, Bld: 119 mg/dL — ABNORMAL HIGH (ref 70–99)
Potassium: 3.7 mEq/L (ref 3.7–5.3)
Sodium: 143 mEq/L (ref 137–147)

## 2013-07-25 LAB — CBC
HCT: 28.1 % — ABNORMAL LOW (ref 36.0–46.0)
Hemoglobin: 9.1 g/dL — ABNORMAL LOW (ref 12.0–15.0)
MCH: 30.8 pg (ref 26.0–34.0)
MCHC: 32.4 g/dL (ref 30.0–36.0)
MCV: 95.3 fL (ref 78.0–100.0)
Platelets: 162 10*3/uL (ref 150–400)
RBC: 2.95 MIL/uL — ABNORMAL LOW (ref 3.87–5.11)
RDW: 15.1 % (ref 11.5–15.5)
WBC: 5.1 10*3/uL (ref 4.0–10.5)

## 2013-07-25 LAB — MAGNESIUM: Magnesium: 1.8 mg/dL (ref 1.5–2.5)

## 2013-07-25 MED ORDER — ALBUTEROL SULFATE (2.5 MG/3ML) 0.083% IN NEBU
2.5000 mg | INHALATION_SOLUTION | Freq: Two times a day (BID) | RESPIRATORY_TRACT | Status: DC
  Administered 2013-07-25 – 2013-08-01 (×15): 2.5 mg via RESPIRATORY_TRACT
  Filled 2013-07-25 (×15): qty 3

## 2013-07-25 MED ORDER — ALBUTEROL SULFATE (2.5 MG/3ML) 0.083% IN NEBU
INHALATION_SOLUTION | RESPIRATORY_TRACT | Status: AC
  Filled 2013-07-25: qty 3

## 2013-07-25 NOTE — Progress Notes (Signed)
Physical Therapy Treatment Patient Details Name: BRIANI MAUL MRN: [id number] DOB: 04-25-1932 Today's Date: 07/25/2013 Time: 6440-3474 PT Time Calculation (min): 57 min  PT Assessment / Plan / Recommendation  History of Present Illness MD reports pt has been diuresed of 12L of fluid since she has been here but she continues to have significant edema especially in Rt UE and both thighsTalked with him about manual lymph drainage , OT consult, splint for right hand and WOC consult to address coban wrapping on legs. He has approved anything we can offer this patient   PT Comments   Extensive time spent with patient today for active and passive exercise exercise, gentle manual lymph drainage and node stimulation. Lotion appliedto skin and patient positioned with elbow extension limb elevation.  Follow Up Recommendations        Does the patient have the potential to tolerate intense rehabilitation     Barriers to Discharge        Equipment Recommendations       Recommendations for Other Services    Frequency     Progress towards PT Goals Progress towards PT goals: Progressing toward goals  Plan Current plan remains appropriate    Precautions / Restrictions     Pertinent Vitals/Pain Pt reported relief of pain after treatment    Mobility  Bed Mobility Overal bed mobility: Needs Assistance;+2 for physical assistance Bed Mobility: Rolling Rolling: Total assist;+2 for physical assistance General bed mobility comments: Pt unable to assist with rolling requiring total assist and using bed pad for extra assist.    Exercises General Exercises - Upper Extremity Shoulder Flexion: AAROM;Both;10 reps Shoulder ABduction: AAROM;Both;10 reps Elbow Flexion: AAROM;Both;10 reps Elbow Extension: AAROM;Both;10 reps Wrist Flexion: AAROM;Both;10 reps Wrist Extension: AAROM;Both;Limitations Wrist Extension Limitations: right wrist limited and painful in extension by Composite Extension:  PROM;Right;Limitations Composite Extension Limitations: pt unable to extend [facility] joints edema present General Exercises - Lower Extremity Short Arc Quad: AROM;Both;10 reps;Supine Hip ABduction/ADduction: AAROM;Both;10 reps;Supine Hip Flexion/Marching: AAROM;Both;10 reps;Supine Other Exercises Other Exercises: deep abdominal breathing   PT Diagnosis:    PT Problem List:   PT Treatment Interventions:     PT Goals (current goals can now be found in the care plan section)    Visit Information  Last PT Received On: 07/25/13 Assistance Needed: +2 History of Present Illness: MD reports pt has been diuresed of 12L of fluid since she has been here but she continues to have significant edema especially in Rt UE and both thighsTalked with him about manual lymph drainage , OT consult, splint for right hand and WOC consult to address coban wrapping on legs. He has approved anything we can offer this patient    Subjective Data      Cognition  Cognition Arousal/Alertness: Awake/alert Behavior During Therapy: WFL for tasks assessed/performed Overall Cognitive Status: History of cognitive impairments - at baseline Area of Impairment: Orientation;Following commands;Safety/judgement;Awareness;Problem solving Orientation Level: Place;Time;Situation (stated she was at school) Following Commands: Follows one step commands with increased time Safety/Judgement: Decreased awareness of safety;Decreased awareness of deficits Awareness: Intellectual Problem Solving: Decreased initiation;Difficulty sequencing;Requires verbal cues;Requires tactile cues    Balance     End of Session PT - End of Session Equipment Utilized During Treatment: Oxygen Activity Tolerance: Patient limited by fatigue;Patient limited by pain Patient left: in bed;with call bell/phone within reach Nurse Communication: Mobility status;Need for lift equipment   GP    Donato Heinz. [place], [place] 07/25/2013, 12:28 PM

## 2013-07-25 NOTE — Progress Notes (Signed)
Orthopedic Tech Progress Note Patient Details:  [name] 05-19-1932 [id number]  Ortho Devices Type of Ortho Device: [name] boot Ortho Device/Splint Location: bilateral unna boots Ortho Device/Splint Interventions: Application   Cammer, [name] 07/25/2013, 1:57 PM

## 2013-07-25 NOTE — Progress Notes (Signed)
NUTRITION FOLLOW UP  Intervention:   Continue Pro-stat BID, each dose provides 15 grams of protein and 100 kcal Continue Multivitamin daily Discontinue Ensure Pudding   Nutrition Dx:   Increased nutrient needs related to wound healing and severe edema as evidenced by pt's chart.  Goal:   Pt to meet >/= 90% of their estimated nutrition needs; no met  Monitor:   PO intake; varies 25% to 100% Weight trends; 15 lb weight loss in past 2 days- pt remains 81 lbs above UBW Labs; high BUN, elevated creatinine, low GFR, low hemoglobin  Assessment:   80 y.o. female with a h/o AFib, pulmonary HTN, right sided heart failure, DM2, HTN, HL and CKD. She lives at [facility]. Pt with significant LE edema with significant desquamation of bilateral LEs.   Per RN pt is not eating any of the Ensure pudding but, she is receiving Pro-stat twice daily. RN states pt is being fed all meals; pt ate about 30% of breakfast this morning. Per nursing notes pt ate 100% of meals the past 2 days.  Some weight loss with improvement in edema.   Height: Ht Readings from Last 1 Encounters:  07/20/13 '5\' 5"'  (1.651 m)    Weight Status:   Wt Readings from Last 1 Encounters:  07/25/13 244 lb 0.8 oz (110.7 kg)    Re-estimated needs:  Kcal: 1600-1800  Protein: 105-120 grams  Fluid: 1.8- 2 L/day  Skin: +3 Generalized edema; stage 2 pressure ulcer on left buttocks; stage 2 pressure ulcer on right thigh   Diet Order: Cardiac   Intake/Output Summary (Last 24 hours) at 07/25/13 1150 Last data filed at 07/25/13 0900  Gross per 24 hour  Intake    960 ml  Output   4100 ml  Net  -3140 ml    Last BM: 1/28   Labs:   Recent Labs Lab 07/23/13 0443 07/24/13 0457 07/25/13 0432  NA 143 141 143  K 3.7 3.3* 3.7  CL 97 95* 95*  CO2 34* 35* 35*  BUN 38* 38* 40*  CREATININE 1.56* 1.51* 1.65*  CALCIUM 9.4 9.3 9.5  MG  --   --  1.8  GLUCOSE 119* 107* 119*    CBG (last 3)   Recent Labs  07/23/13 1136  07/23/13 1607 07/23/13 2102  GLUCAP 162* 123* 119*    Scheduled Meds: . albuterol  2.5 mg Nebulization BID  . albuterol      . atorvastatin  20 mg Oral q1800  . cholecalciferol  1,000 Units Oral Daily  . citalopram  20 mg Oral Daily  . collagenase   Topical Daily  . diltiazem  180 mg Oral Daily  . enoxaparin (LOVENOX) injection  40 mg Subcutaneous Q24H  . feeding supplement (ENSURE)  1 Container Oral TID BM  . feeding supplement (PRO-STAT SUGAR FREE 64)  30 mL Oral BID AC  . ferrous gluconate  324 mg Oral Q breakfast  . furosemide  80 mg Intravenous TID  . hydrALAZINE  50 mg Oral TID  . labetalol  300 mg Oral BID  . levothyroxine  25 mcg Oral QAC breakfast  . metolazone  5 mg Oral BID  . multivitamin with minerals  1 tablet Oral Daily  . potassium chloride  20 mEq Oral BID  . sodium bicarbonate  650 mg Oral TID  . sodium chloride  3 mL Intravenous Q12H  . zinc sulfate  220 mg Oral Daily    Continuous Infusions:   Pryor Ochoa  RD, LDN Inpatient Clinical Dietitian Pager: [phone] After Hours Pager: [phone]

## 2013-07-25 NOTE — Progress Notes (Signed)
Pt. Resting quietly in bed this am. Pt. Restless and moaning during the night. C/o constant leg pain.  Wheezing and crackles noted to pts. Lungs bilaterally upon assessment. RT notified. Breathing tx. Administered and effective. On call MD, notified. No new orders received. RN will continue to monitor pt. For changes in condition. Carleena Mires, Katherine Roan

## 2013-07-25 NOTE — Consult Note (Signed)
WOC wound consult note  Reason for Consult:Evaluation for compression wrap change. Pt unable to tell me when these were last changed and no date on the dressings.  Noted odor from legs.  Wound type:venous stasis ulceration (1 left lateral) (1 right medial) (4 smaller areas posterior right calf) Measurement: R posterior 4 areas all 1.0cm x 1.0cm x 0.2cm  R medial: 3cm x 4cm x 0.5cm  L lateral: 4cm x 2cm x 0.5cm  Wound bed: left lateral and right medial with thick, yellow exudate once I clean away the wounds are all pink, and moist, and noted that there are large areas of re-epithelialization so she has had ulcerations that were at one time much larger. Drainage (amount, consistency, odor) thick, yellow, with odor from the left lateral and right medial, but once cleaned away I feel more likely amounts due to compression wraps being on for extended period of time Periwound: intact, much reduction in the edema noted by skin changes, hemosiderin staining and scarring from previous wounds Dressing procedure/placement/frequency: Silver foam to the open wounds for exudate management, bio burden.  Continue compression wraps with Unna's boots for the venous dermatitis, change weekly (Q Wednesday)  WOC will follow along with you for weekly compression wrap changes.  Neetu Carrozza [place] RN,CWOCN [phone]

## 2013-07-25 NOTE — Progress Notes (Signed)
No change in d/c plan- will return to [facility] when medically stable per MD.  CSW will continue to monitor. Patient remains on IV lasix.  Lorie Phenix. [place], [place]

## 2013-07-25 NOTE — Progress Notes (Signed)
Subjective: Feels better this morning but had some diffuse pain last night.  Breathing better.  Objective: Vital signs in last 24 hours: Temp:  [97.8 F (36.6 C)-98.2 F (36.8 C)] 98.2 F (36.8 C) (01/28 0629) Pulse Rate:  [66-82] 82 (01/28 0629) Resp:  [18-20] 20 (01/28 0629) BP: (90-132)/(59-66) 128/66 mmHg (01/28 0629) SpO2:  [99 %-100 %] 99 % (01/28 0932) Weight:  [244 lb 0.8 oz (110.7 kg)] 244 lb 0.8 oz (110.7 kg) (01/28 0629) Last BM Date: 07/25/13  Intake/Output from previous day: 01/27 0701 - 01/28 0700 In: 80 [P.O.:960] Out: 3200 [Urine:3200] Intake/Output this shift:    Medications Current Facility-Administered Medications  Medication Dose Route Frequency Provider Last Rate Last Dose  . 0.9 %  sodium chloride infusion  250 mL Intravenous PRN Liliane Shi, PA-C      . acetaminophen (TYLENOL) tablet 650 mg  650 mg Oral Q4H PRN Liliane Shi, PA-C   650 mg at 07/24/13 0538  . albuterol (PROVENTIL) (2.5 MG/3ML) 0.083% nebulizer solution 2.5 mg  2.5 mg Nebulization BID Minus Breeding, MD   2.5 mg at 07/25/13 0931  . albuterol (PROVENTIL) (2.5 MG/3ML) 0.083% nebulizer solution           . ALPRAZolam Duanne Moron) tablet 0.25 mg  0.25 mg Oral BID PRN Liliane Shi, PA-C   0.25 mg at 07/25/13 0202  . atorvastatin (LIPITOR) tablet 20 mg  20 mg Oral q1800 Liliane Shi, PA-C   20 mg at 07/24/13 1755  . cholecalciferol (VITAMIN D) tablet 1,000 Units  1,000 Units Oral Daily Minus Breeding, MD   1,000 Units at 07/24/13 1018  . citalopram (CELEXA) tablet 20 mg  20 mg Oral Daily Liliane Shi, PA-C   20 mg at 07/25/13 1019  . collagenase (SANTYL) ointment   Topical Daily Scott T Weaver, PA-C      . diltiazem (CARDIZEM CD) 24 hr capsule 180 mg  180 mg Oral Daily Arnoldo Lenis, MD   180 mg at 07/24/13 1017  . docusate sodium (COLACE) capsule 100 mg  100 mg Oral BID PRN Liliane Shi, PA-C      . enoxaparin (LOVENOX) injection 40 mg  40 mg Subcutaneous Q24H Liliane Shi,  PA-C   40 mg at 07/24/13 1755  . feeding supplement (ENSURE) (ENSURE) pudding 1 Container  1 Container Oral TID BM Liliane Shi, PA-C   1 Container at 07/24/13 2000  . feeding supplement (PRO-STAT SUGAR FREE 64) liquid 30 mL  30 mL Oral BID AC Baird Lyons, RD   30 mL at 07/25/13 0644  . ferrous gluconate (FERGON) tablet 324 mg  324 mg Oral Q breakfast Minus Breeding, MD   324 mg at 07/25/13 0644  . furosemide (LASIX) injection 80 mg  80 mg Intravenous TID Candee Furbish, MD   80 mg at 07/25/13 1018  . hydrALAZINE (APRESOLINE) tablet 50 mg  50 mg Oral TID Liliane Shi, PA-C   50 mg at 07/24/13 2127  . labetalol (NORMODYNE) tablet 300 mg  300 mg Oral BID Liliane Shi, PA-C   300 mg at 07/24/13 2127  . levothyroxine (SYNTHROID, LEVOTHROID) tablet 25 mcg  25 mcg Oral QAC breakfast Liliane Shi, PA-C   25 mcg at 07/25/13 [phone]  . metolazone (ZAROXOLYN) tablet 5 mg  5 mg Oral BID Arnoldo Lenis, MD   5 mg at 07/25/13 1018  . multivitamin with minerals tablet 1 tablet  1 tablet  Oral Daily Minus Breeding, MD   1 tablet at 07/24/13 1018  . ondansetron (ZOFRAN) injection 4 mg  4 mg Intravenous Q6H PRN Liliane Shi, PA-C      . ondansetron (ZOFRAN) tablet 4 mg  4 mg Oral Q8H PRN Liliane Shi, PA-C      . oxyCODONE (Oxy IR/ROXICODONE) immediate release tablet 5 mg  5 mg Oral Q4H PRN Liliane Shi, PA-C   5 mg at 07/25/13 0202  . potassium chloride SA (K-DUR,KLOR-CON) CR tablet 20 mEq  20 mEq Oral BID Erlene Quan, PA-C   20 mEq at 07/24/13 2104  . sodium bicarbonate tablet 650 mg  650 mg Oral TID Liliane Shi, PA-C   650 mg at 07/24/13 2104  . sodium chloride 0.9 % injection 10-40 mL  10-40 mL Intracatheter PRN Minus Breeding, MD   10 mL at 07/23/13 0443  . sodium chloride 0.9 % injection 3 mL  3 mL Intravenous Q12H Liliane Shi, PA-C   3 mL at 07/23/13 2119  . sodium chloride 0.9 % injection 3 mL  3 mL Intravenous PRN Liliane Shi, PA-C      . zinc sulfate capsule 220 mg  220 mg Oral  Daily Liliane Shi, PA-C   220 mg at 07/24/13 1017    PE: General appearance: alert, cooperative and no distress Lungs: Some mild wheezing. Heart: irregularly irregular rhythm Abdomen: +BS,  feels tense/tight.  nontender. Extremities: Legs are wrapped. 2+ LEE abiove the knee and 3+ in the hands. Pulses: 1+ radials Skin: Warm and dry Neurologic: Grossly normal  Lab Results:   Recent Labs  07/25/13 0432  WBC 5.1  HGB 9.1*  HCT 28.1*  PLT 162   BMET  Recent Labs  07/23/13 0443 07/24/13 0457 07/25/13 0432  NA 143 141 143  K 3.7 3.3* 3.7  CL 97 95* 95*  CO2 34* 35* 35*  GLUCOSE 119* 107* 119*  BUN 38* 38* 40*  CREATININE 1.56* 1.51* 1.65*  CALCIUM 9.4 9.3 9.5     Assessment/Plan   Principal Problem:   Acute on chronic diastolic heart failure- EF 60-65% 11/14 Active Problems:   HYPERLIPIDEMIA   HYPERTENSION   PULMONARY HYPERTENSION- PA pk 65 mmHg 11/14   Chronic atrial fibrillation   Hypothyroidism   Chronic renal insufficiency, stage III (moderate)   Right heart failure   Failure to thrive- SNF pt   Rectus sheath hematoma 11/14- Coumadin stopped   Diabetes mellitus with diabetic nephropathy   Anemia in chronic kidney disease(285.21)   Anasarca   Morbid obesity   Hypokalemia  Plan:  Net fluids: -2.2L/-11.6L.  Diuresing well on 80mg  IV lasix TID with BID metolazone.  Slight increase in SCr today.  Continue current dosing.  Mag and  K+ WNL.   Afib-well controlled rate and consistent.   BP stable.     LOS: 5 days    HAGER, BRYAN 07/25/2013 10:19 AM  I have personally seen and examined this patient with Tarri Fuller, PA-C. I agree with the assessment and plan as outlined above. She is still massively volume overloaded. She is negative 12 liters since admission. Will continue diuresis with IV Lasix and metolazone. Follow renal function closely. I have discussed case with physical therapist in room today. Recs are to order OT consult for splint right wrist.  Should be ok for lymphedema therapy. WOC consult to change wraps legs.   Denelle Capurro 07/25/2013 11:41 AM

## 2013-07-26 DIAGNOSIS — E876 Hypokalemia: Secondary | ICD-10-CM

## 2013-07-26 LAB — BASIC METABOLIC PANEL
BUN: 43 mg/dL — ABNORMAL HIGH (ref 6–23)
CO2: 38 mEq/L — ABNORMAL HIGH (ref 19–32)
Calcium: 9.2 mg/dL (ref 8.4–10.5)
Chloride: 94 mEq/L — ABNORMAL LOW (ref 96–112)
Creatinine, Ser: 1.57 mg/dL — ABNORMAL HIGH (ref 0.50–1.10)
GFR calc Af Amer: 35 mL/min — ABNORMAL LOW (ref >90)
GFR calc non Af Amer: 30 mL/min — ABNORMAL LOW (ref >90)
Glucose, Bld: 129 mg/dL — ABNORMAL HIGH (ref 70–99)
Potassium: 3.4 mEq/L — ABNORMAL LOW (ref 3.7–5.3)
Sodium: 141 mEq/L (ref 137–147)

## 2013-07-26 MED ORDER — BIOTENE DRY MOUTH MT LIQD
15.0000 mL | Freq: Two times a day (BID) | OROMUCOSAL | Status: DC
  Administered 2013-07-26 – 2013-08-01 (×13): 15 mL via OROMUCOSAL

## 2013-07-26 MED ORDER — POTASSIUM CHLORIDE CRYS ER 20 MEQ PO TBCR
40.0000 meq | EXTENDED_RELEASE_TABLET | Freq: Two times a day (BID) | ORAL | Status: DC
  Administered 2013-07-26 – 2013-08-01 (×13): 40 meq via ORAL
  Filled 2013-07-26 (×15): qty 2

## 2013-07-26 NOTE — Progress Notes (Signed)
[name] / Admit Date: 07/20/2013 / Date of Encounter: 07/26/2013, 8:53 AM   Subjective  Leg pain much better. SOB improving. Still significantly volume overloaded.  Objective   Telemetry: atrial fib CVR. Monitor states she had brady HR<32 but manual counting reveals HR about 60.  Physical Exam: Blood pressure 108/71, pulse 86, temperature 97.9 F (36.6 C), temperature source Oral, resp. rate 18, height 5\' 5"  (1.651 m), weight 236 lb 9.6 oz (107.321 kg), SpO2 96.00%. General: Well developed chronically ill overweight F in NAD. Head: Normocephalic, atraumatic, sclera non-icteric, no xanthomas, nares are without discharge. Neck: JVP mod elevated. Lungs: Coarse BS throughout. Breathing is unlabored. Heart: Irreg, irreg, S1 S2 without murmurs, rubs, or gallops.  Abdomen: Soft, rounded, normoactive bowel sounds. No rebound/guarding. Extremities: No clubbing or cyanosis. Significant UE edema; LEs are now compressed with ACE wraps.  Neuro: Alert and oriented X 3 but slow pace of speech. Psych:  Pleasant affect.   Intake/Output Summary (Last 24 hours) at 07/26/13 0853 Last data filed at 07/26/13 0600  Gross per 24 hour  Intake    840 ml  Output   4050 ml  Net  -3210 ml    Inpatient Medications:  . albuterol  2.5 mg Nebulization BID  . atorvastatin  20 mg Oral q1800  . cholecalciferol  1,000 Units Oral Daily  . citalopram  20 mg Oral Daily  . collagenase   Topical Daily  . diltiazem  180 mg Oral Daily  . enoxaparin (LOVENOX) injection  40 mg Subcutaneous Q24H  . feeding supplement (PRO-STAT SUGAR FREE 64)  30 mL Oral BID AC  . ferrous gluconate  324 mg Oral Q breakfast  . furosemide  80 mg Intravenous TID  . hydrALAZINE  50 mg Oral TID  . labetalol  300 mg Oral BID  . levothyroxine  25 mcg Oral QAC breakfast  . metolazone  5 mg Oral BID  . multivitamin with minerals  1 tablet Oral Daily  . potassium chloride  40 mEq Oral BID  . sodium bicarbonate  650 mg Oral TID   . sodium chloride  3 mL Intravenous Q12H  . zinc sulfate  220 mg Oral Daily   Infusions:    Labs:  Recent Labs  07/25/13 0432 07/26/13 0536  NA 143 141  K 3.7 3.4*  CL 95* 94*  CO2 35* 38*  GLUCOSE 119* 129*  BUN 40* 43*  CREATININE 1.65* 1.57*  CALCIUM 9.5 9.2  MG 1.8  --    No results found for this basename: AST, ALT, ALKPHOS, BILITOT, PROT, ALBUMIN,  in the last 72 hours  Recent Labs  07/25/13 0432  WBC 5.1  HGB 9.1*  HCT 28.1*  MCV 95.3  PLT 162   No results found for this basename: CKTOTAL, CKMB, TROPONINI,  in the last 72 hours No components found with this basename: POCBNP,  No results found for this basename: HGBA1C,  in the last 72 hours   Radiology/Studies:  Dg Chest 2 View  07/21/2013   CLINICAL DATA:  Anasarca, dyspnea  EXAM: CHEST  2 VIEW  COMPARISON:  Prior chest x-ray 05/10/2013; CT abdomen/pelvis 05/11/2013  FINDINGS: Marked enlargement of the cardiopericardial silhouette. Unchanged mediastinal contours. Moderate right slightly larger than left bilateral layering pleural effusions with associated bibasilar opacities. Pulmonary vascular congestion with mild edema. No pneumothorax. Osseous structures are intact and unremarkable for age.  IMPRESSION: 1. Right greater than left layering pleural effusions with associated bibasilar opacities favored to reflect  atelectasis. 2. Pulmonary vascular congestion with perhaps mild interstitial edema. 3. Marked cardiomegaly.   Electronically Signed   By: Jacqulynn Cadet M.D.   On: 07/21/2013 08:23   Dg Chest Port 1 View  07/22/2013   CLINICAL DATA:  PICC line  EXAM: PORTABLE CHEST - 1 VIEW  COMPARISON:  Yesterday  FINDINGS: PICC placed with its tip in the mid SVC. Cardiomegaly, bilateral pleural effusions, and bilateral airspace disease not significantly changed.  IMPRESSION: PICC tip in mid SVC.   Electronically Signed   By: Maryclare Bean M.D.   On: 07/22/2013 11:22     Assessment and Plan   1. Acute on chronic  diastolic heart failure/anasarca with superimposed pulmonary HTN - Adm wt = 257. Today's weight = 236. Weight in December = 183-184. We have a long way to go. Continue IV Lasix and metolazone. Increase potassium while on metolazone. Starting to bump CO2. Will have MD review further diuresis with MD, regarding whether or not Diamox will be helpful. Is this a patient that might need something like UF?   2. Chronic LEE - as above, lymphedema therapy by PT, wound care.  3. CKD stage III - stable.  4. Chronic atrial fibrillation, not on Coumadin due to h/o rectus sheath hematoma 04/2013 requiring transfusion - stable.  5. Morbid obesity with extreme deconditioning/failure to thrive - plan to return to SNF after DC. Appreciate PT.  6. Anemia (chronic disease, also r/t hematoma 04/2013) - stable.  7. HTN - stable.  8. Diabetes mellitus with diabetic nephropathy - CBGs not requiring any supp insulin at this time.  Signed, Melina Copa PA-C  I have personally seen and examined this patient with Melina Copa, PA-C. I agree with the assessment and plan as outlined above. She is diuresing well but a long ways to go. She is still massively volume overloaded. Down 21 lbs since admission. Renal function stable but CO2 rising. She may benefit from ultrafiltration or a Lasix drip. I will ask the CHF team to see her and give recs.   Dandra Shambaugh 07/26/2013 10:36 AM

## 2013-07-26 NOTE — Progress Notes (Signed)
Pt. Alert and oriented this am. No s/s of distress noted. Pt. Yelling out for nurse this am. Pt. Stated she was having leg pain. PRN pain medication given as ordered. Pt. Also stated she was unable to rest. PRN medication given for agitation and effective. Pt. Now resting in bed quietly. Call light within reach. RN will continue to monitor pt. For changes in condition. Inessa Wardrop, Katherine Roan

## 2013-07-26 NOTE — Evaluation (Signed)
Occupational Therapy Evaluation Patient Details Name: [name] MRN: [id number] DOB: [birth year] Today's Date: 07/26/2013 Time: 3536-1443 OT Time Calculation (min): 36 min  OT Assessment / Plan / Recommendation History of present illness MD reports pt has been diuresed of 12L of fluid since she has been here but she continues to have significant edema especially in Rt UE and both thighsTalked with him about manual lymph drainage , OT consult, splint for right hand and WOC consult to address coban wrapping on legs. He has approved anything we can offer this patient   Clinical Impression   Pt admitted with above.  She demonstrates the below listed deficits.  Pt has been total A at NH for some time; however, do feel she has the ability to perform simple ADL tasks such as feeding if adaptive equipment and modifications uses.  Also feel a trial of OT at SNF would be warranted to see if she can increase her functional mobility and independence with BADLs.  Assessed Rt. UE for splinting.  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  She has lost PROM at her wrist and has tightness in her digits and definitely would benefit from splinting; however, the severity of her edema and lack of consistent caregiver makes this tricky as she would be at risk for skin breakdown as splint fit will fluctuate with her edema.  Spoke with Dr. Angelena Form and will work on basic positioning and ROM for now, as they look at options to further reduce fluid load.  Will then reassess best splinting options once edema reduced.     OT Assessment  Patient needs continued OT Services    Follow Up Recommendations  SNF    Barriers to Discharge      Equipment Recommendations  None recommended by OT    Recommendations for Other Services    Frequency  Min 2X/week    Precautions  / Restrictions Precautions Precautions: Fall Restrictions Weight Bearing Restrictions: No   Pertinent Vitals/Pain     ADL  Eating/Feeding: Maximal assistance Where Assessed - Eating/Feeding: Bed level Grooming: Wash/dry hands;Wash/dry face;Minimal assistance Where Assessed - Grooming: Supine, head of bed up Upper Body Bathing: +1 Total assistance Where Assessed - Upper Body Bathing: Supine, head of bed up Lower Body Bathing: +1 Total assistance Where Assessed - Lower Body Bathing: Supine, head of bed up;Rolling right and/or left Upper Body Dressing: +1 Total assistance Where Assessed - Upper Body Dressing: Supine, head of bed up Lower Body Dressing: +1 Total assistance Where Assessed - Lower Body Dressing: Supine, head of bed up;Rolling right and/or left Toilet Transfer: +1 Total assistance (unable) Toileting - Clothing Manipulation and Hygiene: +1 Total assistance Where Assessed - Toileting Clothing Manipulation and Hygiene: Supine, head of bed flat;Rolling right and/or left Transfers/Ambulation Related to ADLs: Pt requires 2 person lift or hoyer lift to transfer to w/c    OT Diagnosis: Generalized weakness  OT Problem List: Decreased strength;Decreased range of motion;Decreased activity tolerance;Impaired balance (sitting and/or standing);Decreased knowledge of use of DME or AE;Cardiopulmonary status limiting activity;Obesity;Impaired UE functional use;Pain OT Treatment Interventions: Therapeutic exercise;Therapeutic activities;Splinting;Patient/family education;Self-care/ADL training   OT Goals(Current goals can be found in the care plan section) Acute Rehab OT Goals Patient Stated Goal: Pt did not state OT Goal Formulation: With patient Time For Goal Achievement: 08/09/13 Potential to Achieve Goals: Good ADL Goals Pt Will  Perform Eating: with mod assist;with adaptive utensils;bed level Additional ADL Goal #1: Pt will tolerate splinting Rt. UE without evidence of skin  breakdown Additional ADL Goal #2: Pt will demonstrate 10 degrees wrist extension to improve functional use of Rt. UE  Visit Information  Last OT Received On: 07/26/13 Assistance Needed: +2 History of Present Illness: MD reports pt has been diuresed of 12L of fluid since she has been here but she continues to have significant edema especially in Rt UE and both thighsTalked with him about manual lymph drainage , OT consult, splint for right hand and WOC consult to address coban wrapping on legs. He has approved anything we can offer this patient       Prior [place] expects to be discharged to:: Skilled nursing facility Additional Comments: Pt admitted from [facility] SNF Prior Function Level of Independence: Needs assistance Gait / Transfers Assistance Needed: Transfer to wheelchair with 2 person assist with mechanical lift per pt; pt could not propel wheelchair ADL's / [place] Needed: total assist for care.  Pt states she had difficulty feeding herself.   Comments: Pt reports difficulty self feeding due to tremor in Lt. UE Communication Communication: No difficulties Dominant Hand: Left         Vision/Perception     Cognition  Cognition Arousal/Alertness: Awake/alert Behavior During Therapy: WFL for tasks assessed/performed Overall Cognitive Status: No family/caregiver present to determine baseline cognitive functioning Area of Impairment: Problem solving;Awareness;Following commands Orientation Level: Disoriented to;Time;Situation Following Commands: Follows one step commands with increased time Problem Solving: Slow processing;Decreased initiation    Extremity/Trunk Assessment Upper Extremity Assessment Upper Extremity Assessment: RUE deficits/detail;LUE deficits/detail RUE Deficits / Details: Pt maintains wrist and MCPs in flexed position.  She is unable to actively extend wrist or MCPs.  Active flexion of digits WFL.   Significant edema noted throughout Lt. UE.  PROM wrist to neutral with discomfort due to tightness.  Finger extension WFL, but with discomfort due to tightness.  Strength shoulder and elbow grossly 2+/5 - 3-/5.  Pt does not attempt to use or incorporate Rt. activity into activities RUE Coordination: decreased fine motor;decreased gross motor LUE Deficits / Details: AROM and PROM WFL.  Strength grossly 4-/5 throughout.  Significant edema noted.  Intention tremor present LUE Coordination: decreased fine motor;decreased gross motor Lower Extremity Assessment Lower Extremity Assessment: Defer to PT evaluation     Mobility Bed Mobility Bed Mobility: Rolling Rolling: Total assist     Exercise General Exercises - Upper Extremity Shoulder Flexion: AAROM;Right;Left;10 reps;Supine Elbow Flexion: AAROM;Both;10 reps Elbow Extension: AAROM;Both;10 reps Wrist Flexion: AAROM;Both;10 reps Wrist Extension: AAROM;Both;Limitations Wrist Extension Limitations: Rt. wrist to neutral  Composite Extension: PROM;Right;Limitations Composite Extension Limitations: able to achieve full extension, but painful due to stiffness   Balance General Comments General comments (skin integrity, edema, etc.): Cardiologist present at end of eval. Discussion re: level of edema  and risks of splinting.  He is planning to adjust medications and consulting HF team to look at options for reducing fluid load more quickly.   End of Session OT - End of Session Activity Tolerance: Patient tolerated treatment well Patient left: in bed;with call bell/phone within reach Nurse Communication: Other (comment) (positioning for Rt. UE)  GO     Graves Nipp, Ellard Artis M 07/26/2013, 12:27 PM

## 2013-07-27 ENCOUNTER — Inpatient Hospital Stay (HOSPITAL_COMMUNITY): Payer: Medicare Other

## 2013-07-27 LAB — BASIC METABOLIC PANEL
BUN: 45 mg/dL — ABNORMAL HIGH (ref 6–23)
CO2: 38 mEq/L — ABNORMAL HIGH (ref 19–32)
Calcium: 9.1 mg/dL (ref 8.4–10.5)
Chloride: 94 mEq/L — ABNORMAL LOW (ref 96–112)
Creatinine, Ser: 1.56 mg/dL — ABNORMAL HIGH (ref 0.50–1.10)
GFR calc Af Amer: 35 mL/min — ABNORMAL LOW (ref >90)
GFR calc non Af Amer: 30 mL/min — ABNORMAL LOW (ref >90)
Glucose, Bld: 117 mg/dL — ABNORMAL HIGH (ref 70–99)
Potassium: 3.7 mEq/L (ref 3.7–5.3)
Sodium: 142 mEq/L (ref 137–147)

## 2013-07-27 MED ORDER — MORPHINE SULFATE 4 MG/ML IJ SOLN
3.0000 mg | Freq: Once | INTRAMUSCULAR | Status: AC
  Administered 2013-07-27: 3 mg via INTRAVENOUS
  Filled 2013-07-27: qty 1

## 2013-07-27 MED ORDER — METOLAZONE 5 MG PO TABS
5.0000 mg | ORAL_TABLET | Freq: Once | ORAL | Status: AC
  Administered 2013-07-27: 5 mg via ORAL
  Filled 2013-07-27: qty 1

## 2013-07-27 MED ORDER — FUROSEMIDE 10 MG/ML IJ SOLN
40.0000 mg | Freq: Once | INTRAMUSCULAR | Status: AC
Start: 2013-07-27 — End: 2013-07-27
  Administered 2013-07-27: 40 mg via INTRAVENOUS

## 2013-07-27 MED ORDER — LABETALOL HCL 300 MG PO TABS
150.0000 mg | ORAL_TABLET | Freq: Two times a day (BID) | ORAL | Status: DC
  Administered 2013-07-27: 150 mg via ORAL
  Administered 2013-07-27: 300 mg via ORAL
  Administered 2013-07-28 – 2013-08-01 (×9): 150 mg via ORAL
  Filled 2013-07-27 (×12): qty 0.5

## 2013-07-27 MED ORDER — HYDRALAZINE HCL 25 MG PO TABS
25.0000 mg | ORAL_TABLET | Freq: Three times a day (TID) | ORAL | Status: DC
Start: 2013-07-27 — End: 2013-08-01
  Administered 2013-07-27 – 2013-08-01 (×16): 25 mg via ORAL
  Filled 2013-07-27 (×18): qty 1

## 2013-07-27 MED ORDER — FUROSEMIDE 10 MG/ML IJ SOLN
15.0000 mg/h | INTRAMUSCULAR | Status: DC
  Administered 2013-07-27 – 2013-07-31 (×7): 15 mg/h via INTRAVENOUS
  Filled 2013-07-27 (×14): qty 25

## 2013-07-27 NOTE — Progress Notes (Signed)
Pt. Resting quietly in bed this am. Pt. Yelling and restless during the night. Pt. C/o pain to her legs. PRN pain medication administered and ineffective. On call MD, Aundra Dubin, notified. New orders received and implemented. No distress noted. Call light placed within reach. RN will continue to monitor pt. For changes in condition. Jai Steil, Katherine Roan

## 2013-07-27 NOTE — Progress Notes (Addendum)
[name] / Admit Date: 07/20/2013 / Date of Encounter: 07/27/2013, 8:02 AM   Subjective  Mild dyspnea at rest.   Objective   Telemetry:   Physical Exam: Blood pressure 106/56, pulse 66, temperature 98.2 F (36.8 C), temperature source Oral, resp. rate 20, height 5\' 5"  (1.651 m), weight 236 lb 5.4 oz (107.202 kg), SpO2 100.00%. General: Well developed chronically ill overweight F in NAD. Head: Normocephalic, atraumatic, sclera non-icteric, no xanthomas, nares are without discharge. Neck: JVP to ear. Lungs: Coarse BS throughout. Breathing is unlabored. Heart: Irreg, irreg, S1 S2 without murmurs, rubs, or gallops.  Abdomen: Obese, Soft, rounded, normoactive bowel sounds. No rebound/guarding. Extremities: No clubbing or cyanosis. Significant UE edema 3+ ; LEs are now compressed with ACE wraps. LUE PICC  Neuro: Alert and oriented X 3 but slow pace of speech. Psych:  Pleasant affect. GU: Foley    Intake/Output Summary (Last 24 hours) at 07/27/13 0802 Last data filed at 07/27/13 0500  Gross per 24 hour  Intake   1260 ml  Output   2750 ml  Net  -1490 ml    Inpatient Medications:  . albuterol  2.5 mg Nebulization BID  . antiseptic oral rinse  15 mL Mouth Rinse BID  . atorvastatin  20 mg Oral q1800  . cholecalciferol  1,000 Units Oral Daily  . citalopram  20 mg Oral Daily  . collagenase   Topical Daily  . diltiazem  180 mg Oral Daily  . enoxaparin (LOVENOX) injection  40 mg Subcutaneous Q24H  . feeding supplement (PRO-STAT SUGAR FREE 64)  30 mL Oral BID AC  . ferrous gluconate  324 mg Oral Q breakfast  . furosemide  80 mg Intravenous TID  . hydrALAZINE  50 mg Oral TID  . labetalol  300 mg Oral BID  . levothyroxine  25 mcg Oral QAC breakfast  . metolazone  5 mg Oral BID  . multivitamin with minerals  1 tablet Oral Daily  . potassium chloride  40 mEq Oral BID  . sodium bicarbonate  650 mg Oral TID  . sodium chloride  3 mL Intravenous Q12H  . zinc sulfate  220 mg  Oral Daily   Infusions:    Labs:  Recent Labs  07/25/13 0432 07/26/13 0536 07/27/13 0545  NA 143 141 142  K 3.7 3.4* 3.7  CL 95* 94* 94*  CO2 35* 38* 38*  GLUCOSE 119* 129* 117*  BUN 40* 43* 45*  CREATININE 1.65* 1.57* 1.56*  CALCIUM 9.5 9.2 9.1  MG 1.8  --   --    No results found for this basename: AST, ALT, ALKPHOS, BILITOT, PROT, ALBUMIN,  in the last 72 hours  Recent Labs  07/25/13 0432  WBC 5.1  HGB 9.1*  HCT 28.1*  MCV 95.3  PLT 162   No results found for this basename: CKTOTAL, CKMB, TROPONINI,  in the last 72 hours No components found with this basename: POCBNP,  No results found for this basename: HGBA1C,  in the last 72 hours   Radiology/Studies:  Dg Chest 2 View  07/21/2013   CLINICAL DATA:  Anasarca, dyspnea  EXAM: CHEST  2 VIEW  COMPARISON:  Prior chest x-ray 05/10/2013; CT abdomen/pelvis 05/11/2013  FINDINGS: Marked enlargement of the cardiopericardial silhouette. Unchanged mediastinal contours. Moderate right slightly larger than left bilateral layering pleural effusions with associated bibasilar opacities. Pulmonary vascular congestion with mild edema. No pneumothorax. Osseous structures are intact and unremarkable for age.  IMPRESSION: 1. Right greater than  left layering pleural effusions with associated bibasilar opacities favored to reflect atelectasis. 2. Pulmonary vascular congestion with perhaps mild interstitial edema. 3. Marked cardiomegaly.   Electronically Signed   By: Jacqulynn Cadet M.D.   On: 07/21/2013 08:23   Dg Chest Port 1 View  07/22/2013   CLINICAL DATA:  PICC line  EXAM: PORTABLE CHEST - 1 VIEW  COMPARISON:  Yesterday  FINDINGS: PICC placed with its tip in the mid SVC. Cardiomegaly, bilateral pleural effusions, and bilateral airspace disease not significantly changed.  IMPRESSION: PICC tip in mid SVC.   Electronically Signed   By: Maryclare Bean M.D.   On: 07/22/2013 11:22     Assessment and Plan   1. Acute on chronic diastolic heart  failure/anasarca with superimposed pulmonary HTN - Adm wt = 257. Today's weight = 236. Weight in December = 183-184. Volume status remains elevated. Give 40 mg IV lasix and start lasix drip at 15 mg per hour.  Will give one dose of metolazone today.  Will Transfer to stepdown to monitor CVPs.    2. Chronic LEE - as above, lymphedema therapy by PT, wound care.  3. CKD stage III - stable.Creatinine baseline 1.5-1.6   4. Chronic atrial fibrillation, not on Coumadin due to h/o rectus sheath hematoma 04/2013 requiring transfusion - stable.  5. Morbid obesity with extreme deconditioning/failure to thrive - plan to return to SNF after DC. Appreciate PT.  6. Anemia (chronic disease, also r/t hematoma 04/2013) - stable.  7. HTN - stable.  8. Diabetes mellitus with diabetic nephropathy - CBGs not requiring any supp insulin at this time.  CLEGG,[name]  07/27/2013 8:02 AM  Patient seen with NP, agree with the above note.  1. Acute on chronic diastolic CHF: She remains massively volume overloaded with elevated JVP.  Will transition to Lasix gtt as above, will give a dose of metolazone today.  Will transfer to stepdown to allow CVP monitoring.  2. CKD: Creatinine stable so far with diuresis.  Follow carefully.  3. Atrial fibrillation: Chronic, not on anticoagulation due to prior rectus sheath hematoma.  Continue diltiazem CD and labetalol for rate control. 4. HTN: BP running more on the low side.  Will cut hydralazine and labetalol in half.  5. Long-term prognosis will be poor.   [name] 07/27/2013 8:25 AM

## 2013-07-27 NOTE — Progress Notes (Signed)
Occupational Therapy Treatment Patient Details Name: [name] MRN: [id number] DOB: 26-May-1932 Today's Date: 07/27/2013 Time: 9937-1696 OT Time Calculation (min): 50 min  OT Assessment / Plan / Recommendation  History of present illness Pt admit with anasarca.  MD reports pt has been diuresed of 12L of fluid since she has been here but she continues to have significant edema especially in Rt UE and both thighsTalked with him about manual lymph drainage , OT consult, splint for right hand and WOC consult to address coban wrapping on legs. He has approved anything we can offer this patient   OT comments  Patient found supine in bed with RUE slightly elevated. Patient with 7/10 pain; RN made aware. Therapist washed RUE with warm water and light soap. Therapist then performed light Manual Lymph Drainage > RUE; opening up anastomosis and through stroke techniques over entire RUE; no abdominal techniques performed at this time due to CHF. After MLD, therapist engaged patient in [place] from shoulder > fingers; see below. Lotion applied after. Patient tolerated MLD and light AAROM > RUE well, just with complaints of BLE pain throughout session. At end of session, left patient supine in bed with call bell & phone within reach. RUE elevated on towels and pillows. Believe patient would benefit from MLD followed by compression wrapping, but due to CHF wrapping techniques may be too invasive/intense for patient at this time. Recommending OT continue to perform AAROM > RUE and elevation using pillows and towels during rest. Encouraged patient to wiggle fingers as much as possible to help decrease edema. Chrys Racer, MS, OTR/L, CLT  Follow Up Recommendations  SNF       Equipment Recommendations  None recommended by OT       Frequency Min 2X/week   Progress towards OT Goals Progress towards OT goals: Progressing toward goals  Plan Discharge plan remains appropriate    Precautions / Restrictions  Precautions Precautions: Fall Restrictions Weight Bearing Restrictions: No   Pertinent Vitals/Pain Patient with 7/10 complaints of pain in BLEs. Therapist notified RN of pain.     OT Goals(current goals can now be found in the care plan section) ADL Goals Pt Will Perform Eating: with mod assist;with adaptive utensils;bed level Additional ADL Goal #1: Pt will tolerate splinting Rt. UE without evidence of skin breakdown Additional ADL Goal #2: Pt will demonstrate 10 degrees wrist extension to improve functional use of Rt. UE  Visit Information  Last OT Received On: 07/27/13 History of Present Illness: Pt admit with anasarca.  MD reports pt has been diuresed of 12L of fluid since she has been here but she continues to have significant edema especially in Rt UE and both thighsTalked with him about manual lymph drainage , OT consult, splint for right hand and WOC consult to address coban wrapping on legs. He has approved anything we can offer this patient    Subjective Data  Patient yelling in pain; RN aware.  Prior Functioning  Home Living Family/patient expects to be discharged to:: Skilled nursing facility Additional Comments: Pt admitted from [place]: Awake/alert Behavior During Therapy: [facility] for tasks assessed/performed Overall Cognitive Status: No family/caregiver present to determine baseline cognitive functioning Area of Impairment: Problem solving;Awareness;Following commands Orientation Level: Disoriented to;Time;Situation Following Commands: Follows one step commands with increased time Problem Solving: Slow processing;Decreased initiation       Exercises  General Exercises - Upper Extremity Shoulder Flexion: AAROM;5 reps;Supine;Right Elbow Flexion: AAROM;Supine;Right;5 reps Elbow Extension:  AAROM;Right;5 reps;Supine Wrist Flexion: AAROM;Right;5 reps;Supine Wrist Extension: AAROM;Right;5 reps;Supine Wrist Extension  Limitations: right wrist with limited extension PROM      End of Session OT - End of Session Activity Tolerance: Patient tolerated treatment well Patient left: in bed;with call bell/phone within reach (with right UE properlly elevated and positioned using pillows and towels) Nurse Communication: Other (comment) (proper positioning Rt. UE)  GO     Leondro Coryell 07/27/2013, 3:12 PM

## 2013-07-27 NOTE — Progress Notes (Signed)
Physical Therapy Treatment Patient Details Name: [name] MRN: [id number] DOB: 07-23-1931 Today's Date: 07/27/2013 Time: 1607-3710 PT Time Calculation (min): 25 min  PT Assessment / Plan / Recommendation  History of Present Illness Pt admit with anasarca.  MD reports pt has been diuresed of 12L of fluid since she has been here but she continues to have significant edema especially in Rt UE and both thighsTalked with him about manual lymph drainage , OT consult, splint for right hand and WOC consult to address coban wrapping on legs. He has approved anything we can offer this patient   PT Comments   Pt admitted with above. Pt currently with functional limitations due to balance and endurance deficits.  Pt will benefit from skilled PT to increase their independence and safety with mobility to allow discharge to the venue listed below.   Follow Up Recommendations  SNF;Supervision/Assistance - 24 hour                 Equipment Recommendations  Other (comment) (TBA)        Frequency Min 2X/week   Progress towards PT Goals Progress towards PT goals: Progressing toward goals  Plan Current plan remains appropriate    Precautions / Restrictions Precautions Precautions: Fall Restrictions Weight Bearing Restrictions: No   Pertinent Vitals/Pain VSS, Patient with bil LE pain with movement.      Mobility  Bed Mobility Overal bed mobility: Needs Assistance;+2 for physical assistance Bed Mobility: Rolling;Supine to Sit;Sit to Supine Rolling: Total assist;+2 for physical assistance Supine to sit: Total assist;+2 for physical assistance Sit to supine: +2 for physical assistance;Total assist General bed mobility comments: Pt unable to assist with rolling requiring total assist and using bed pad for extra assist.    Exercises General Exercises - Upper Extremity Shoulder Flexion: AAROM;Both;10 reps;Supine General Exercises - Lower Extremity Long Arc Quad: AAROM;Both;5 reps;Seated Hip  ABduction/ADduction: AAROM;Both;10 reps;Supine Had difficulty with LE ROM and movement due to bil LE pain.    PT Goals (current goals can now be found in the care plan section)    Visit Information  Last PT Received On: 07/27/13 Assistance Needed: +2 History of Present Illness: Pt admit with anasarca.  MD reports pt has been diuresed of 12L of fluid since she has been here but she continues to have significant edema especially in Rt UE and both thighsTalked with him about manual lymph drainage , OT consult, splint for right hand and WOC consult to address coban wrapping on legs. He has approved anything we can offer this patient    Subjective Data  Subjective: "I want to try."   Cognition  Cognition Arousal/Alertness: Awake/alert Behavior During Therapy: WFL for tasks assessed/performed Overall Cognitive Status: No family/caregiver present to determine baseline cognitive functioning Area of Impairment: Problem solving;Awareness;Following commands Orientation Level: Disoriented to;Time;Situation Following Commands: Follows one step commands with increased time Problem Solving: Slow processing;Decreased initiation    Balance  Balance Overall balance assessment: Needs assistance Sitting-balance support: Bilateral upper extremity supported;Feet supported Sitting balance-Leahy Scale: Fair Sitting balance - Comments: Pt sat EOB x 15 min with varying assist from min guard for min at a time to mod at times due to posterior lean. Washed pts back and changed her gown while pt on EOB as she was sweaty.  Pt washed her face.  Fatigued after 15 min at EOb and needed assist to lie down.   Postural control: Posterior lean General Comments General comments (skin integrity, edema, etc.): Placed UEs on pillows at  end of treatment as continue to be edematous.    End of Session PT - End of Session Equipment Utilized During Treatment: Oxygen Activity Tolerance: Patient limited by fatigue;Patient  limited by pain Patient left: in bed;with call bell/phone within reach Nurse Communication: Mobility status;Need for lift equipment (Asked nursing to use lift and get pt up when able)        INGOLD,Kavin Weckwerth 07/27/2013, 2:25 PM [facility] Acute Rehabilitation [phone] (pager)

## 2013-07-27 NOTE — Progress Notes (Signed)
Occupational Therapy Treatment Patient Details Name: [name] MRN: [id number] DOB: [birth year] Today's Date: 07/27/2013 Time:  -     OT Assessment / Plan / Recommendation  History of present illness Pt admit with anasarca.  MD reports pt has been diuresed of 12L of fluid since she has been here but she continues to have significant edema especially in Rt UE and both thighsTalked with him about manual lymph drainage , OT consult, splint for right hand and WOC consult to address coban wrapping on legs. He has approved anything we can offer this patient   OT comments  Pt continues with significant edema.  Currently, pt not appropriate for splinting due to risk being to high for development of pressure sore.  Will continue to follow for positioning to prevent further loss of ROM.    Follow Up Recommendations  SNF    Barriers to Discharge       Equipment Recommendations  None recommended by OT    Recommendations for Other Services    Frequency Min 2X/week   Progress towards OT Goals Progress towards OT goals: Progressing toward goals  Plan Discharge plan remains appropriate    Precautions / Restrictions Precautions Precautions: Fall Restrictions Weight Bearing Restrictions: No   Pertinent Vitals/Pain     ADL  ADL Comments: Pt supine with Rt. UE propped on blanket with wrist and fingers flexed.  Gentle PROM performed Rt. wrist and digits.  Soft tissue mobilization of carpals perfmed. Only able to achieve wrist extension passively to neutral and full passive digit extension achieved.  Sign modified above bed with picture of correct positioning.    OT Diagnosis:    OT Problem List:   OT Treatment Interventions:     OT Goals(current goals can now be found in the care plan section) ADL Goals Pt Will Perform Eating: with mod assist;with adaptive utensils;bed level Additional ADL Goal #1: Pt will tolerate splinting Rt. UE without evidence of skin breakdown Additional ADL Goal #2: Pt  will demonstrate 10 degrees wrist extension to improve functional use of Rt. UE  Visit Information  Last OT Received On: 07/27/13 Assistance Needed: +2 History of Present Illness: Pt admit with anasarca.  MD reports pt has been diuresed of 12L of fluid since she has been here but she continues to have significant edema especially in Rt UE and both thighsTalked with him about manual lymph drainage , OT consult, splint for right hand and WOC consult to address coban wrapping on legs. He has approved anything we can offer this patient    Subjective Data      Prior [place] expects to be discharged to:: Skilled nursing facility Additional Comments: Pt admitted from [place]: Awake/alert Behavior During Therapy: [facility] for tasks assessed/performed Overall Cognitive Status: No family/caregiver present to determine baseline cognitive functioning Area of Impairment: Problem solving;Awareness;Following commands Orientation Level: Disoriented to;Time;Situation Following Commands: Follows one step commands with increased time Problem Solving: Slow processing;Decreased initiation    Mobility  Bed Mobility Overal bed mobility: Needs Assistance;+2 for physical assistance Bed Mobility: Rolling;Supine to Sit;Sit to Supine Rolling: Total assist;+2 for physical assistance Supine to sit: Total assist;+2 for physical assistance Sit to supine: +2 for physical assistance;Total assist General bed mobility comments: Pt unable to assist with rolling requiring total assist and using bed pad for extra assist.    Exercises  General Exercises - Upper Extremity Shoulder Flexion: AAROM;Both;10 reps;Supine General Exercises -  Lower Extremity Long Arc Quad: AAROM;Both;5 reps;Seated Hip ABduction/ADduction: AAROM;Both;10 reps;Supine   Balance Balance Overall balance assessment: Needs assistance Sitting-balance support: Bilateral  upper extremity supported;Feet supported Sitting balance-Leahy Scale: Fair Sitting balance - Comments: Pt sat EOB x 15 min with varying assist from min guard for min at a time to mod at times due to posterior lean. Washed pts back and changed her gown while pt on EOB as she was sweaty.  Pt washed her face.  Fatigued after 15 min at EOb and needed assist to lie down.   Postural control: Posterior lean General Comments General comments (skin integrity, edema, etc.): Placed UEs on pillows at end of treatment as continue to be edematous.    End of Session OT - End of Session Activity Tolerance: Patient tolerated treatment well Patient left: in bed;with call bell/phone within reach Nurse Communication: Other (comment) (proper positioning Rt. UE)  GO     Samay Delcarlo, Ellard Artis M 07/27/2013, 2:26 PM

## 2013-07-27 NOTE — Progress Notes (Signed)
Medicare Important Message given. 

## 2013-07-28 LAB — BASIC METABOLIC PANEL
BUN: 50 mg/dL — ABNORMAL HIGH (ref 6–23)
CO2: 38 mEq/L — ABNORMAL HIGH (ref 19–32)
Calcium: 9.5 mg/dL (ref 8.4–10.5)
Chloride: 94 mEq/L — ABNORMAL LOW (ref 96–112)
Creatinine, Ser: 1.59 mg/dL — ABNORMAL HIGH (ref 0.50–1.10)
GFR calc Af Amer: 35 mL/min — ABNORMAL LOW (ref >90)
GFR calc non Af Amer: 30 mL/min — ABNORMAL LOW (ref >90)
Glucose, Bld: 131 mg/dL — ABNORMAL HIGH (ref 70–99)
Potassium: 3.8 mEq/L (ref 3.7–5.3)
Sodium: 142 mEq/L (ref 137–147)

## 2013-07-28 NOTE — Progress Notes (Signed)
Patient Name: [name] Date of Encounter: 07/28/2013     Principal Problem:   Acute on chronic diastolic heart failure- EF 60-65% 11/14 Active Problems:   HYPERLIPIDEMIA   HYPERTENSION   PULMONARY HYPERTENSION- PA pk 65 mmHg 11/14   Chronic atrial fibrillation   Hypothyroidism   Chronic renal insufficiency, stage III (moderate)   Right heart failure   Failure to thrive- SNF pt   Rectus sheath hematoma 11/14- Coumadin stopped   Diabetes mellitus with diabetic nephropathy   Anemia in chronic kidney disease(285.21)   Anasarca   Morbid obesity   Hypokalemia    SUBJECTIVE  Patient with SOB and productive cough. She does not answer questions directly and keeps muttering "lord have mercy." She is able however to tell me she does not have any chest pain.   CURRENT MEDS . albuterol  2.5 mg Nebulization BID  . antiseptic oral rinse  15 mL Mouth Rinse BID  . atorvastatin  20 mg Oral q1800  . cholecalciferol  1,000 Units Oral Daily  . citalopram  20 mg Oral Daily  . collagenase   Topical Daily  . diltiazem  180 mg Oral Daily  . enoxaparin (LOVENOX) injection  40 mg Subcutaneous Q24H  . feeding supplement (PRO-STAT SUGAR FREE 64)  30 mL Oral BID AC  . ferrous gluconate  324 mg Oral Q breakfast  . hydrALAZINE  25 mg Oral TID  . labetalol  150 mg Oral BID  . levothyroxine  25 mcg Oral QAC breakfast  . multivitamin with minerals  1 tablet Oral Daily  . potassium chloride  40 mEq Oral BID  . sodium bicarbonate  650 mg Oral TID  . sodium chloride  3 mL Intravenous Q12H  . zinc sulfate  220 mg Oral Daily    OBJECTIVE  Filed Vitals:   07/27/13 2101 07/28/13 0500 07/28/13 0805 07/28/13 1122  BP: 142/67 147/78  137/75  Pulse: 70 71  95  Temp: 97.4 F (36.3 C) 97.8 F (36.6 C)    TempSrc: Oral Oral    Resp: 18 20  18   Height:      Weight:  229 lb 4.5 oz (104 kg)    SpO2: 100% 98% 99% 100%    Intake/Output Summary (Last 24 hours) at 07/28/13 1203 Last data filed at  07/28/13 1009  Gross per 24 hour  Intake 995.25 ml  Output   4100 ml  Net -3104.75 ml   Filed Weights   07/26/13 0453 07/27/13 0547 07/28/13 0500  Weight: 236 lb 9.6 oz (107.321 kg) 236 lb 5.4 oz (107.202 kg) 229 lb 4.5 oz (104 kg)    PHYSICAL EXAM  General: Well developed chronically ill overweight F in NAD.  Head: Normocephalic, atraumatic, sclera non-icteric, no xanthomas, nares are without discharge.  Neck: JVP to ear.  Lungs: Coarse BS throughout with productive wet cough. Breathing is unlabored.  Heart: Irreg, irreg, S1 S2 without murmurs, rubs, or gallops.  Abdomen: Obese, Soft, rounded, normoactive bowel sounds. No rebound/guarding.  Extremities: No clubbing or cyanosis. Significant LE/ UE edema 3+ ; LEs are now compressed with ACE wraps. LUE PICC  Neuro: Alert and oriented X 3 but slow pace of speech.  Psych: Pleasant affect.  GU: Foley   Accessory Clinical Findings  CBC  Basic Metabolic Panel  Recent Labs  07/27/13 0545 07/28/13 0508  NA 142 142  K 3.7 3.8  CL 94* 94*  CO2 38* 38*  GLUCOSE 117* 131*  BUN 45* 50*  CREATININE 1.56* 1.59*  CALCIUM 9.1 9.5    TELE  Atrial fib with RBBB  ECG  Afib with  RBBB   Radiology/Studies  Dg Chest 1 View  07/27/2013   CLINICAL DATA:  80 year old female with shortness of breath cough. Initial encounter.  EXAM: CHEST - 1 VIEW  COMPARISON:  07/22/2013 and earlier.  FINDINGS: Portable AP semi upright view at 0855 hrs. Stable left PICC line. Stable cardiac size and mediastinal contours. No pneumothorax. Continued bilateral veiling pulmonary opacity compatible with pleural effusions, greater on the right and appearing moderate in volume. Associated dense bibasilar opacity is stable.  IMPRESSION: Continued right greater than left pleural effusions with bibasilar atelectasis or consolidation.    Dg Chest 2 View  07/21/2013   CLINICAL DATA:  Anasarca, dyspnea  EXAM: CHEST  2 VIEW  COMPARISON:  Prior chest x-ray 05/10/2013;  CT abdomen/pelvis 05/11/2013  FINDINGS: Marked enlargement of the cardiopericardial silhouette. Unchanged mediastinal contours. Moderate right slightly larger than left bilateral layering pleural effusions with associated bibasilar opacities. Pulmonary vascular congestion with mild edema. No pneumothorax. Osseous structures are intact and unremarkable for age.  IMPRESSION: 1. Right greater than left layering pleural effusions with associated bibasilar opacities favored to reflect atelectasis. 2. Pulmonary vascular congestion with perhaps mild interstitial edema. 3. Marked cardiomegaly.  Dg Chest Port 1 View  07/22/2013   CLINICAL DATA:  PICC line  EXAM: PORTABLE CHEST - 1 VIEW  COMPARISON:  Yesterday  FINDINGS: PICC placed with its tip in the mid SVC. Cardiomegaly, bilateral pleural effusions, and bilateral airspace disease not significantly changed.  IMPRESSION: PICC tip in mid SVC.    ASSESSMENT AND PLAN [name] is a 80 y.o. nursing home patient with a h/o AFib, pulmonary HTN, right sided heart failure, DM2, HTN, HL and CKD who presented to outpatient cardiology appointment on 07/20/13 with failure to thrive with worsening edema despite increased oral diuretics. She was sent to the hospital to be admitted for IV diuresis.  Acute on chronic diastolic heart failure/anasarca with superimposed pulmonary HTN  -- Down 28 lbs since admission. Adm wt = 257. Today's weight = 229. Weight in December = 183-184. -- She remains massively volume overloaded with elevated JVP. -- Given a dose of metolazone yesterday and put on a Lasix gtt. Continue to monitor  Chronic LE edema-  lymphedema therapy by PT and wound care.   CKD stage III - stable. Creatinine baseline 1.5-1.6.   HTN - stable. Was soft yesterday so hydralazine and labetalol were cut in half   Chronic atrial fibrillation, not on Coumadin due to h/o rectus sheath hematoma 04/2013 requiring transfusion - stable.   Morbid obesity with extreme  deconditioning/failure to thrive - plan to return to SNF after DC.   Anemia of chronic disease - stable.   Diabetes mellitus with diabetic nephropathy - continue to monitor  Tyrell Antonio PA-C  Pager [phone]   Attending note:  Patient seen and examined. Discussed case with Ms. Verl Dicker. Patient has had a substantial diuresis so far, however still not near her dry weight, previously in the 180s back in December. Medications adjusted recently due to low blood pressures, currently blood pressure heart rate are stable. Would continue Lasix infusion.  Satira Sark, M.D., F.A.C.C.

## 2013-07-29 NOTE — Progress Notes (Signed)
Patient Name: [name] Date of Encounter: 07/29/2013     Principal Problem:   Acute on chronic diastolic heart failure- EF 60-65% 11/14 Active Problems:   HYPERLIPIDEMIA   HYPERTENSION   PULMONARY HYPERTENSION- PA pk 65 mmHg 11/14   Chronic atrial fibrillation   Hypothyroidism   Chronic renal insufficiency, stage III (moderate)   Right heart failure   Failure to thrive- SNF pt   Rectus sheath hematoma 11/14- Coumadin stopped   Diabetes mellitus with diabetic nephropathy   Anemia in chronic kidney disease(285.21)   Anasarca   Morbid obesity   Hypokalemia    SUBJECTIVE  Patient denies any chest pain. No significant dyspnea at rest. Telemetry shows atrial fibrillation with controlled ventricular response.  CURRENT MEDS . albuterol  2.5 mg Nebulization BID  . antiseptic oral rinse  15 mL Mouth Rinse BID  . atorvastatin  20 mg Oral q1800  . cholecalciferol  1,000 Units Oral Daily  . citalopram  20 mg Oral Daily  . collagenase   Topical Daily  . diltiazem  180 mg Oral Daily  . enoxaparin (LOVENOX) injection  40 mg Subcutaneous Q24H  . feeding supplement (PRO-STAT SUGAR FREE 64)  30 mL Oral BID AC  . ferrous gluconate  324 mg Oral Q breakfast  . hydrALAZINE  25 mg Oral TID  . labetalol  150 mg Oral BID  . levothyroxine  25 mcg Oral QAC breakfast  . multivitamin with minerals  1 tablet Oral Daily  . potassium chloride  40 mEq Oral BID  . sodium bicarbonate  650 mg Oral TID  . sodium chloride  3 mL Intravenous Q12H  . zinc sulfate  220 mg Oral Daily    OBJECTIVE  Filed Vitals:   07/28/13 2159 07/29/13 0402 07/29/13 0947 07/29/13 0950  BP:  125/65 124/59   Pulse:  88 91   Temp:  98.8 F (37.1 C) 98.5 F (36.9 C)   TempSrc:  Oral Oral   Resp:  18    Height:      Weight:  226 lb 3.1 oz (102.6 kg)    SpO2: 99% 98% 98% 98%    Intake/Output Summary (Last 24 hours) at 07/29/13 1215 Last data filed at 07/29/13 1004  Gross per 24 hour  Intake 1186.25 ml  Output    4975 ml  Net -3788.75 ml   Filed Weights   07/27/13 0547 07/28/13 0500 07/29/13 0402  Weight: 236 lb 5.4 oz (107.202 kg) 229 lb 4.5 oz (104 kg) 226 lb 3.1 oz (102.6 kg)    PHYSICAL EXAM  General: Pleasant, NAD. Lethargic. Neuro: Alert and oriented X 3. Old right hemiparesis. Psych: Normal affect. HEENT:  Normal  Neck: Supple without bruits or JVD. Lungs:  Respirations are unlabored. Coarse rhonchi. Wet cough. Heart: Irregular rhythm. No murmur gallop or rub. Abdomen: Soft, non-tender, non-distended, BS + x 4. Foley draining well. Extremities: Still has significant pitting edema bilaterally.  Accessory Clinical Findings  CBC No results found for this basename: WBC, NEUTROABS, HGB, HCT, MCV, PLT,  in the last 72 hours Basic Metabolic Panel  Recent Labs  07/27/13 0545 07/28/13 0508  NA 142 142  K 3.7 3.8  CL 94* 94*  CO2 38* 38*  GLUCOSE 117* 131*  BUN 45* 50*  CREATININE 1.56* 1.59*  CALCIUM 9.1 9.5   Liver Function Tests No results found for this basename: AST, ALT, ALKPHOS, BILITOT, PROT, ALBUMIN,  in the last 72 hours No results found for this basename:  LIPASE, AMYLASE,  in the last 72 hours Cardiac Enzymes No results found for this basename: CKTOTAL, CKMB, CKMBINDEX, TROPONINI,  in the last 72 hours BNP No components found with this basename: POCBNP,  D-Dimer No results found for this basename: DDIMER,  in the last 72 hours Hemoglobin A1C No results found for this basename: HGBA1C,  in the last 72 hours Fasting Lipid Panel No results found for this basename: CHOL, HDL, LDLCALC, TRIG, CHOLHDL, LDLDIRECT,  in the last 72 hours Thyroid Function Tests No results found for this basename: TSH, T4TOTAL, FREET3, T3FREE, THYROIDAB,  in the last 72 hours  TELE  Atrial fibrillation with controlled ventricular response.    Radiology/Studies  Dg Chest 1 View  07/27/2013   CLINICAL DATA:  79 year old female with shortness of breath cough. Initial encounter.   EXAM: CHEST - 1 VIEW  COMPARISON:  07/22/2013 and earlier.  FINDINGS: Portable AP semi upright view at 0855 hrs. Stable left PICC line. Stable cardiac size and mediastinal contours. No pneumothorax. Continued bilateral veiling pulmonary opacity compatible with pleural effusions, greater on the right and appearing moderate in volume. Associated dense bibasilar opacity is stable.  IMPRESSION: Continued right greater than left pleural effusions with bibasilar atelectasis or consolidation.   Electronically Signed   By: Lars Pinks M.D.   On: 07/27/2013 11:10   Dg Chest 2 View  07/21/2013   CLINICAL DATA:  Anasarca, dyspnea  EXAM: CHEST  2 VIEW  COMPARISON:  Prior chest x-ray 05/10/2013; CT abdomen/pelvis 05/11/2013  FINDINGS: Marked enlargement of the cardiopericardial silhouette. Unchanged mediastinal contours. Moderate right slightly larger than left bilateral layering pleural effusions with associated bibasilar opacities. Pulmonary vascular congestion with mild edema. No pneumothorax. Osseous structures are intact and unremarkable for age.  IMPRESSION: 1. Right greater than left layering pleural effusions with associated bibasilar opacities favored to reflect atelectasis. 2. Pulmonary vascular congestion with perhaps mild interstitial edema. 3. Marked cardiomegaly.   Electronically Signed   By: Jacqulynn Cadet M.D.   On: 07/21/2013 08:23   Dg Chest Port 1 View  07/22/2013   CLINICAL DATA:  PICC line  EXAM: PORTABLE CHEST - 1 VIEW  COMPARISON:  Yesterday  FINDINGS: PICC placed with its tip in the mid SVC. Cardiomegaly, bilateral pleural effusions, and bilateral airspace disease not significantly changed.  IMPRESSION: PICC tip in mid SVC.   Electronically Signed   By: Maryclare Bean M.D.   On: 07/22/2013 11:22    ASSESSMENT AND PLAN [name] is a 80 y.o. nursing home patient with a h/o AFib, pulmonary HTN, right sided heart failure, DM2, HTN, HL and CKD who presented to outpatient cardiology appointment on  07/20/13 with failure to thrive with worsening edema despite increased oral diuretics. She was sent to the hospital to be admitted for IV diuresis.  Acute on chronic diastolic heart failure/anasarca with superimposed pulmonary HTN  -- Down 31 lbs since admission. Adm wt = 257. Today's weight = 226. Weight in December = 183-184.  -- She remains massively volume overloaded with elevated JVP.  -- Continue lasix infusion. Chronic LE edema- lymphedema therapy by PT and wound care.  CKD stage III - stable. Creatinine baseline 1.5-1.6.  Creatinine 1.59 today. HTN - stable.  Chronic atrial fibrillation, not on Coumadin due to h/o rectus sheath hematoma 04/2013 requiring transfusion - stable.  Morbid obesity with extreme deconditioning/failure to thrive - plan to return to SNF after DC.  Anemia of chronic disease - stable.  Diabetes mellitus with diabetic nephropathy -  continue to monitor  Signed,   Signed, Darlin Coco MD

## 2013-07-29 NOTE — Progress Notes (Signed)
Patient alert and oriented x2, disoriented to time and situation.  Patient denied any pain throughout shift, and was calm and relaxed.  Patient's sons at bedside this evening.  Patient denies any shortness of breath at this time and remains on 2L via nasal cannula.  Patient and sons state they have no questions or concerns at this time.  Will continue to monitor.

## 2013-07-29 NOTE — Clinical Social Work Psychosocial (Addendum)
    Clinical Social Work Department BRIEF PSYCHOSOCIAL ASSESSMENT 07/29/2013  Patient:  [name], [name]     Account Number:  [id number]     Admit date:  07/20/2013  Clinical Social Worker:  Iona Coach  Date/Time:  07/24/2013 01:00 PM  Referred by:  Physician  Date Referred:  07/24/2013 Referred for  Other - See comment   Other Referral:   Return to SNF   Interview type:  Patient Other interview type:    PSYCHOSOCIAL DATA Living Status:  FACILITY Admitted from facility:  [facility] Level of care:  [place] Primary support name:  Taneika Choi   [phone] Primary support relationship to patient:  CHILD, ADULT Degree of support available:   Strong support    CURRENT CONCERNS Current Concerns  Other - See comment   Other Concerns:   (return to SNF when stable)    SOCIAL WORK ASSESSMENT / PLAN 80 year old female admitted from [facility] where she is a long term care patient.  Patient is noted to be alert and oriented to her surroundings- her speech is slow but she can be understood. Patient wants to return to the SNF when medically stable and states that her son Jeneen Rinks helps her with her finances and care needs.  She gives permission for CSW to talk with her son.  Fl2 placed on chart for MD's signature.   Assessment/plan status:  Psychosocial Support/Ongoing Assessment of Needs Other assessment/ plan:   Information/referral to community resources:   None at this time    PATIENT'S/FAMILY'S RESPONSE TO PLAN OF CARE: Patient is alert and aware of her surroundings. She wants to return to SNF when medically stable. States that she is feeling "some better".  CSW left message for son Jeneen Rinks to call back re: plan of care.  CSW will monitor and assist with d/c when medically stable.  Lorie Phenix. [place], [place]

## 2013-07-30 LAB — URINALYSIS, ROUTINE W REFLEX MICROSCOPIC
BILIRUBIN URINE: NEGATIVE
GLUCOSE, UA: NEGATIVE mg/dL
Ketones, ur: NEGATIVE mg/dL
NITRITE: NEGATIVE
PH: 8.5 — AB (ref 5.0–8.0)
Protein, ur: 100 mg/dL — AB
SPECIFIC GRAVITY, URINE: 1.01 (ref 1.005–1.030)
Urobilinogen, UA: 1 mg/dL (ref 0.0–1.0)

## 2013-07-30 LAB — BASIC METABOLIC PANEL
BUN: 51 mg/dL — ABNORMAL HIGH (ref 6–23)
CALCIUM: 9.8 mg/dL (ref 8.4–10.5)
CHLORIDE: 92 meq/L — AB (ref 96–112)
CO2: 42 meq/L — AB (ref 19–32)
Creatinine, Ser: 1.46 mg/dL — ABNORMAL HIGH (ref 0.50–1.10)
GFR calc Af Amer: 38 mL/min — ABNORMAL LOW (ref >90)
GFR calc non Af Amer: 33 mL/min — ABNORMAL LOW (ref >90)
Glucose, Bld: 150 mg/dL — ABNORMAL HIGH (ref 70–99)
POTASSIUM: 4 meq/L (ref 3.7–5.3)
SODIUM: 145 meq/L (ref 137–147)

## 2013-07-30 LAB — URINE MICROSCOPIC-ADD ON

## 2013-07-30 LAB — CBC
HCT: 29.5 % — ABNORMAL LOW (ref 36.0–46.0)
HEMOGLOBIN: 9.2 g/dL — AB (ref 12.0–15.0)
MCH: 30.2 pg (ref 26.0–34.0)
MCHC: 31.2 g/dL (ref 30.0–36.0)
MCV: 96.7 fL (ref 78.0–100.0)
PLATELETS: 199 10*3/uL (ref 150–400)
RBC: 3.05 MIL/uL — AB (ref 3.87–5.11)
RDW: 15.1 % (ref 11.5–15.5)
WBC: 7.4 10*3/uL (ref 4.0–10.5)

## 2013-07-30 NOTE — Progress Notes (Addendum)
Patient ID: [name], female   DOB: 06/17/1932, 80 y.o.   MRN: [id number]    [name] / Admit Date: 07/20/2013 / Date of Encounter: 07/30/2013, 8:03 AM   Subjective  Stable this morning, diuresed well over weekend.  Says that breathing is better.  Unfortunately, no labs since Friday.   Objective   Telemetry:   Physical Exam: Blood pressure 118/90, pulse 89, temperature 99 F (37.2 C), temperature source Oral, resp. rate 19, height 5\' 5"  (1.651 m), weight 209 lb 14.1 oz (95.2 kg), SpO2 100.00%. General: Well developed chronically ill overweight F in NAD. Head: Normocephalic, atraumatic, sclera non-icteric, no xanthomas, nares are without discharge. Neck: JVP to ear. Lungs: Coarse BS throughout. Breathing is unlabored. Heart: Irreg, irreg, S1 S2 without murmurs, rubs, or gallops.  Abdomen: Obese, Soft, rounded, normoactive bowel sounds. No rebound/guarding. Extremities: No clubbing or cyanosis. Significant UE edema 3+ ; LEs are now compressed with ACE wraps. LUE PICC  Neuro: Alert and oriented X 3 but slow pace of speech. Psych:  Pleasant affect. GU: Foley    Intake/Output Summary (Last 24 hours) at 07/30/13 0803 Last data filed at 07/30/13 0615  Gross per 24 hour  Intake 1117.83 ml  Output   5500 ml  Net -4382.17 ml    Inpatient Medications:  . albuterol  2.5 mg Nebulization BID  . antiseptic oral rinse  15 mL Mouth Rinse BID  . atorvastatin  20 mg Oral q1800  . cholecalciferol  1,000 Units Oral Daily  . citalopram  20 mg Oral Daily  . collagenase   Topical Daily  . diltiazem  180 mg Oral Daily  . enoxaparin (LOVENOX) injection  40 mg Subcutaneous Q24H  . feeding supplement (PRO-STAT SUGAR FREE 64)  30 mL Oral BID AC  . ferrous gluconate  324 mg Oral Q breakfast  . hydrALAZINE  25 mg Oral TID  . labetalol  150 mg Oral BID  . levothyroxine  25 mcg Oral QAC breakfast  . multivitamin with minerals  1 tablet Oral Daily  . potassium chloride  40 mEq Oral BID  .  sodium bicarbonate  650 mg Oral TID  . sodium chloride  3 mL Intravenous Q12H  . zinc sulfate  220 mg Oral Daily   Infusions:  . furosemide (LASIX) infusion 15 mg/hr (07/29/13 1525)    Labs:  Recent Labs  07/28/13 0508  NA 142  K 3.8  CL 94*  CO2 38*  GLUCOSE 131*  BUN 50*  CREATININE 1.59*  CALCIUM 9.5   No results found for this basename: AST, ALT, ALKPHOS, BILITOT, PROT, ALBUMIN,  in the last 72 hours No results found for this basename: WBC, NEUTROABS, HGB, HCT, MCV, PLT,  in the last 72 hours No results found for this basename: CKTOTAL, CKMB, TROPONINI,  in the last 72 hours No components found with this basename: POCBNP,  No results found for this basename: HGBA1C,  in the last 72 hours     Assessment and Plan   1. Acute on chronic diastolic heart failure/anasarca with superimposed pulmonary HTN - Adm wt = 257. Today's weight = 209. Weight in December = 183-184. Volume status remains elevated but she is doing well on Lasix gtt.  Good UOP over weekend without metolazone, will continue with Lasix gtt at 15 mg/hr.  She did not get labs over the weekend so will order stat now.  Suspect she will need a couple more days of IV diuresis then back to  SNF.   2. Chronic LEE - as above, lymphedema therapy by PT, wound care.  3. CKD stage III - stable.Creatinine baseline 1.5-1.6.  Await BMET today.   4. Chronic atrial fibrillation -  Reasonable rate control.  Not on Coumadin due to h/o rectus sheath hematoma 04/2013 requiring transfusion.  5. Morbid obesity with extreme deconditioning/failure to thrive - plan to return to SNF after DC. Appreciate PT.  6. Anemia (chronic disease, also rectus sheath hematoma 04/2013) - stable.  7. HTN - stable.  8. Diabetes mellitus with diabetic nephropathy - CBGs not requiring any supp insulin at this time.  9. Long-term prognosis poor.   [name] 07/30/2013 8:03 AM

## 2013-07-30 NOTE — Progress Notes (Signed)
CRITICAL VALUE ALERT  Critical value received:  CO2 42  Date of notification:  07/30/2013  Time of notification:  10:18  Critical value read back:yes  Nurse who received alert:  Levander Campion, RN  MD notified (1st page):  Junie Bame, NP  Time of first page:  10:20  Responding MD:  Junie Bame, NP  Time MD responded:  10:22

## 2013-07-30 NOTE — Progress Notes (Signed)
Occupational Therapy Treatment Patient Details Name: [name] MRN: [id number] DOB: [birth year] Today's Date: 07/30/2013 Time: 6301-6010 OT Time Calculation (min): 26 min  OT Assessment / Plan / Recommendation  History of present illness Pt admit with anasarca.  MD reports pt has been diuresed of 12L of fluid since she has been here but she continues to have significant edema especially in Rt UE and both thighsTalked with him about manual lymph drainage , OT consult, splint for right hand and WOC consult to address coban wrapping on legs. He has approved anything we can offer this patient   OT comments  This 80 yo female continuing to have issues with RUE ROM and edema control. Will continue to work with her on this and continue to monitor for appropriateness of splint  Follow Up Recommendations  SNF       Equipment Recommendations  None recommended by OT       Frequency Min 2X/week   Progress towards OT Goals Progress towards OT goals: Not progressing toward goals - comment (less wrist extension today over last session)  Plan Discharge plan remains appropriate    Precautions / Restrictions Precautions Precautions: Fall Restrictions Weight Bearing Restrictions: No   Pertinent Vitals/Pain 6/10 FACES with working on RUE; repositioned and RN will give patient some Tylenol (she had already had pain meds before I entered and her first c/o pain to me were her legs)      OT Goals(current goals can now be found in the care plan section)    Visit Information  Last OT Received On: 07/30/13 Assistance Needed: +2 (If up to EOB/OOB) History of Present Illness: Pt admit with anasarca.  MD reports pt has been diuresed of 12L of fluid since she has been here but she continues to have significant edema especially in Rt UE and both thighsTalked with him about manual lymph drainage , OT consult, splint for right hand and WOC consult to address coban wrapping on legs. He has approved anything we  can offer this patient          Cognition  Cognition Arousal/Alertness: Awake/alert Behavior During Therapy: WFL for tasks assessed/performed Overall Cognitive Status: No family/caregiver present to determine baseline cognitive functioning       Exercises  Other Exercises Other Exercises: Upon walking into room, pt's RUE was propped up on a pillow; however wrist was in complete flexion and edema starting from 1/3 way up forearm was 4+ pitting edema (>30 seconds to rebound). Worked on gentle stretch, retrograde massage, and joint mobs of wrist-- after about 15 minutes of this I was able to get pt's right to about -20 degrees fo extension and place a towel roll to help to continue to facilitate this position (RN in room and aware). Pt will full digit extension; PROM for digit flexion pt c/o pain with each digit. at about 1/2 range      End of Session OT - End of Session Activity Tolerance: Patient limited by pain Patient left: in bed Nurse Communication: Patient requests pain meds (proper positioning of RUE)       Almon Register [phone] 07/30/2013, 11:07 AM

## 2013-07-30 NOTE — Progress Notes (Addendum)
Patient alert and oriented x2, disoriented to situation and time.  Stat labs ordered for patient this morning by MD as labs were not drawn over the weekend.  Requested UA orders from medical team; urine sample collected from patient's catheter as she has had yellow discharge and malodorous urine.  Results pending.  Patient remains on Lasix drip via left midline.  Patient experiencing pain in bilateral lower extremities this shift; medicated with 5mg  PRN oxy IR.  Rating pain 0 on Faces scale post-intervention.  Patient states she has no questions or concerns at this time.  Will continue to monitor.

## 2013-07-30 NOTE — Progress Notes (Signed)
Occupational Therapy Treatment Patient Details Name: [name] MRN: [id number] DOB: 11/28/1931 Today's Date: 07/30/2013 Time: 6073-7106 OT Time Calculation (min): 20 min  OT Assessment / Plan / Recommendation  History of present illness Pt admit with anasarca.  MD reports pt has been diuresed of 12L of fluid since she has been here but she continues to have significant edema especially in Rt UE and both thighsTalked with him about manual lymph drainage , OT consult, splint for right hand and WOC consult to address coban wrapping on legs. He has approved anything we can offer this patient   OT comments  This pt has not really made gains in right wrist movement since last week; however I was able to get her to almost neutral wrist after the PM since nursing had passively maintained the range achieved this AM through positioning.  Follow Up Recommendations  SNF       Equipment Recommendations  None recommended by OT       Frequency Min 2X/week   Progress towards OT Goals Progress towards OT goals: Not progressing toward goals - comment (however did achieve close to 20 degrees more extension to almost neutral)  Plan Discharge plan remains appropriate    Precautions / Restrictions Precautions Precautions: Fall Restrictions Weight Bearing Restrictions: No   Pertinent Vitals/Pain Grimace with wrist movements      OT Goals(current goals can now be found in the care plan section)    Visit Information  Last OT Received On: 07/30/13 Assistance Needed: +2 (if up to EOB/OOB) History of Present Illness: Pt admit with anasarca.  MD reports pt has been diuresed of 12L of fluid since she has been here but she continues to have significant edema especially in Rt UE and both thighsTalked with him about manual lymph drainage , OT consult, splint for right hand and WOC consult to address coban wrapping on legs. He has approved anything we can offer this patient          Cognition   Cognition Arousal/Alertness: Lethargic Behavior During Therapy: Flat affect Overall Cognitive Status: No family/caregiver present to determine baseline cognitive functioning       Exercises  Other Exercises Other Exercises: Second time I saw the pt today. Upon walking into the room noted pt's RUE in a great position (nursing staff had done a wonderful job keeping it propped up). Applied heat for 10 minutes then did wrist mobs, wrist traction, and stretching--leaving pt in an almost neutral position for wrist.      End of Session OT - End of Session Activity Tolerance: Patient tolerated treatment well Patient left: in bed Nurse Communication:  (Let her know they had done an awesome job with positioning of her arm since the AM when I left)       Almon Register [phone] 07/30/2013, 4:12 PM

## 2013-07-31 LAB — BASIC METABOLIC PANEL
BUN: 52 mg/dL — ABNORMAL HIGH (ref 6–23)
CALCIUM: 9.8 mg/dL (ref 8.4–10.5)
CO2: 43 mEq/L (ref 19–32)
Chloride: 90 mEq/L — ABNORMAL LOW (ref 96–112)
Creatinine, Ser: 1.48 mg/dL — ABNORMAL HIGH (ref 0.50–1.10)
GFR calc Af Amer: 38 mL/min — ABNORMAL LOW (ref >90)
GFR calc non Af Amer: 32 mL/min — ABNORMAL LOW (ref >90)
GLUCOSE: 139 mg/dL — AB (ref 70–99)
Potassium: 4.1 mEq/L (ref 3.7–5.3)
SODIUM: 142 meq/L (ref 137–147)

## 2013-07-31 LAB — CBC
HCT: 29.8 % — ABNORMAL LOW (ref 36.0–46.0)
Hemoglobin: 9.4 g/dL — ABNORMAL LOW (ref 12.0–15.0)
MCH: 30.6 pg (ref 26.0–34.0)
MCHC: 31.5 g/dL (ref 30.0–36.0)
MCV: 97.1 fL (ref 78.0–100.0)
PLATELETS: 217 10*3/uL (ref 150–400)
RBC: 3.07 MIL/uL — ABNORMAL LOW (ref 3.87–5.11)
RDW: 15 % (ref 11.5–15.5)
WBC: 7.9 10*3/uL (ref 4.0–10.5)

## 2013-07-31 LAB — URINE CULTURE: Colony Count: >=100000

## 2013-07-31 MED ORDER — CIPROFLOXACIN HCL 500 MG PO TABS
500.0000 mg | ORAL_TABLET | Freq: Two times a day (BID) | ORAL | Status: DC
  Filled 2013-07-31 (×3): qty 1

## 2013-07-31 MED ORDER — DEXTROSE 5 % IV SOLN
1.0000 g | INTRAVENOUS | Status: DC
  Administered 2013-07-31: 1 g via INTRAVENOUS
  Filled 2013-07-31 (×2): qty 10

## 2013-07-31 NOTE — Progress Notes (Signed)
Occupational Therapy Treatment Patient Details Name: [name] MRN: [id number] DOB: 1931/12/11 Today's Date: 07/31/2013 Time: 1497-0263 OT Time Calculation (min): 35 min  OT Assessment / Plan / Recommendation  History of present illness Pt admit with anasarca.  MD reports pt has been diuresed of 12L of fluid since she has been here but she continues to have significant edema especially in Rt UE and both thighsTalked with him about manual lymph drainage , OT consult, splint for right hand and WOC consult to address coban wrapping on legs. He has approved anything we can offer this patient   OT comments  This pt is really not making any progress with RUE due to pain in RUE with movement, due to pain in Bil LEs feeding into her moving her RUE and her wrist automatically goes back into flexion (that she cannot move out of), and cognition.   Follow Up Recommendations  SNF       Equipment Recommendations  None recommended by OT       Frequency Min 2X/week   Progress towards OT Goals Progress towards OT goals: Not progressing toward goals - comment (pain in legs was feeding over into positioning/range in right wrist)  Plan Discharge plan remains appropriate    Precautions / Restrictions Precautions Precautions: Fall Restrictions Weight Bearing Restrictions: No   Pertinent Vitals/Pain 8/10 in Bil LEs, RN made aware; attempted to reposition however this seemed to only make her pain worse.    ADL  ADL Comments: While I was using heat on pt's RUE, I had pt use LUE to feed herself. She could not manipulate utensils like she needed to based partially on her positioning in bed; however if utensil with food on it was given to her or her cup of juice was handed to her she was able to then feed herself  (except for the grits which were too runny for her to try)      OT Goals(current goals can now be found in the care plan section)    Visit Information  Last OT Received On:  07/31/13 Assistance Needed: +2 History of Present Illness: Pt admit with anasarca.  MD reports pt has been diuresed of 12L of fluid since she has been here but she continues to have significant edema especially in Rt UE and both thighsTalked with him about manual lymph drainage , OT consult, splint for right hand and WOC consult to address coban wrapping on legs. He has approved anything we can offer this patient          Cognition  Cognition Arousal/Alertness: Awake/alert Behavior During Therapy: Flat affect Overall Cognitive Status: No family/caregiver present to determine baseline cognitive functioning       Exercises  Other Exercises Other Exercises: Upon walking into room, pt's RUE was propped up on a pillow and towel roll; however wrist was flexed to about -30 degrees from extension and edema remains starting from 1/3 way up forearm and is  4+ pitting edema (>30 seconds to rebound). Applied heat x 10 minutes to anterior and dorsum of hand/wrist, then worked on gentle stretch, and joint mobs of wrist-- after about 10 minutes of this I was able to get pt's right to about -10 degrees of extension. Adjusted pt's HOB back 5 degrees at which point she started to c/o leg pain and she lifted up her RUE with resultant increased wrist flexion (with all the work done on her back to where we had started). Ended up propping her whole  arm up on a pillow with elbow lower than wrist and made RN aware that this would be best for now (that adding the towel roll at this point would only further her tendency for wrist flexion. Pt continues with full digit extension; PROM for digit flexion pt continues with  c/o pain with each digit at about 1/2 range      End of Session OT - End of Session Activity Tolerance: Patient limited by pain Patient left: in bed;with call bell/phone within reach;with nursing/sitter in room Nurse Communication:  (Why I had left her RUE propped without towel roll)       Almon Register  [phone]  07/31/2013, 12:19 PM

## 2013-07-31 NOTE — Progress Notes (Signed)
No change in d/c plan. Will return to [facility] when stable. Awaiting MD's approval.  Donnella Sham, [place] Intern  [phone]

## 2013-07-31 NOTE — Progress Notes (Signed)
Utilization review completed.  

## 2013-07-31 NOTE — Progress Notes (Signed)
Patient is complaining of 8 out of 10 pain in her legs, pain medication has been given PO and the patient continues to have pain, PA notified and states she will be up to assess the patient; will continue to monitor the patient.  Ruben Im RN

## 2013-07-31 NOTE — Progress Notes (Signed)
Patient continues to complain of pain, pain medication given, will reassess pain in an hour. Ruben Im RN

## 2013-07-31 NOTE — Progress Notes (Signed)
Physical Therapy Treatment Patient Details Name: ZULEICA SEITH MRN: [id number] DOB: [birth year] Today's Date: 07/31/2013 Time: 1062-6948 PT Time Calculation (min): 24 min  PT Assessment / Plan / Recommendation  History of Present Illness Pt admit with anasarca.  MD reports pt has been diuresed of 12L of fluid since she has been here but she continues to have significant edema especially in Rt UE and both thighsTalked with him about manual lymph drainage , OT consult, splint for right hand and WOC consult to address coban wrapping on legs. He has approved anything we can offer this patient   PT Comments   Pt admitted with above. Pt currently with functional limitations due to balance and endurance deficits listed below (see PT Problem List).  Pt will benefit from skilled PT to increase their independence and safety with mobility to allow discharge to the venue listed below.   Follow Up Recommendations  SNF;Supervision/Assistance - 24 hour                 Equipment Recommendations  Other (comment) (TBA)        Frequency Min 2X/week   Progress towards PT Goals Progress towards PT goals: Progressing toward goals  Plan Current plan remains appropriate    Precautions / Restrictions Precautions Precautions: Fall Restrictions Weight Bearing Restrictions: No   Pertinent Vitals/Pain VSS, Generalized pain.    Mobility  Bed Mobility Overal bed mobility: Needs Assistance;+2 for physical assistance Bed Mobility: Rolling;Supine to Sit;Sit to Supine Rolling: Total assist;+2 for physical assistance Supine to sit: Total assist;+2 for physical assistance Sit to supine: +2 for physical assistance;Total assist General bed mobility comments: Pt unable to assist with rolling requiring total assist and using bed pad for extra assist. Transfers Overall transfer level: Needs assistance Equipment used:  (bed pad) Transfers: Lateral/Scoot Transfers  Lateral/Scoot Transfers: Total assist;+2 physical  assistance (to drop arm recliner) General transfer comment: Used bed pad and scooted pt to recliner with +2 assist.     Exercises General Exercises - Upper Extremity Shoulder Flexion: AAROM;5 reps;Supine;Right Shoulder ABduction: AAROM;Both;10 reps General Exercises - Lower Extremity Long Arc Quad: AAROM;Both;5 reps;Seated Hip ABduction/ADduction: AAROM;Both;10 reps;Supine Other Exercises Other Exercises: deep abdominal breathing   PT Goals (current goals can now be found in the care plan section)    Visit Information  Last PT Received On: 07/31/13 Assistance Needed: +2 History of Present Illness: Pt admit with anasarca.  MD reports pt has been diuresed of 12L of fluid since she has been here but she continues to have significant edema especially in Rt UE and both thighsTalked with him about manual lymph drainage , OT consult, splint for right hand and WOC consult to address coban wrapping on legs. He has approved anything we can offer this patient    Subjective Data  Subjective: "I want to try."   Cognition  Cognition Arousal/Alertness: Awake/alert Behavior During Therapy: Flat affect Overall Cognitive Status: No family/caregiver present to determine baseline cognitive functioning Area of Impairment: Problem solving;Awareness;Following commands Orientation Level: Disoriented to;Time;Situation Following Commands: Follows one step commands with increased time Safety/Judgement: Decreased awareness of safety;Decreased awareness of deficits Awareness: Intellectual Problem Solving: Slow processing;Decreased initiation    Balance  Balance Overall balance assessment: Needs assistance;History of Falls Sitting-balance support: Bilateral upper extremity supported;Feet supported Sitting balance-Leahy Scale: Fair Sitting balance - Comments: Sat EOB 15 min with varying assist from min guard to min assist.  posterior lean at times.   Postural control: Posterior lean General  Comments General comments (skin  integrity, edema, etc.): Placed UEs on pillows once in chair.  Extra care given to prop right UE in appropriate position.    End of Session PT - End of Session Equipment Utilized During Treatment: Gait belt;Oxygen Activity Tolerance: Patient limited by fatigue;Patient limited by pain Patient left: in chair;with call bell/phone within reach Nurse Communication: Mobility status;Need for lift equipment        INGOLD,Norine Reddington 07/31/2013, 4:18 PM [facility] [phone] (pager)

## 2013-07-31 NOTE — Progress Notes (Signed)
Patient ID: [name], female   DOB: 1931/08/14, 80 y.o.   MRN: [id number]    [name] / Admit Date: 07/20/2013 / Date of Encounter: 07/31/2013, 7:21 AM   Subjective  Patient diuresed well yesterday.  Found to have UTI.  Somewhat flat affect but will answer questions.   Objective   Telemetry:   Physical Exam: Blood pressure 120/86, pulse 86, temperature 97.6 F (36.4 C), temperature source Oral, resp. rate 20, height 5\' 5"  (1.651 m), weight 198 lb 3.1 oz (89.9 kg), SpO2 100.00%. General: Well developed chronically ill overweight F in NAD. Head: Normocephalic, atraumatic, sclera non-icteric, no xanthomas, nares are without discharge. Neck: JVP 14 cm. Lungs: Coarse BS throughout. Breathing is unlabored. Heart: Irreg, irreg, S1 S2 without murmurs, rubs, or gallops.  Abdomen: Obese, Soft, rounded, normoactive bowel sounds. No rebound/guarding. Extremities: No clubbing or cyanosis. LEs are now compressed with ACE wraps. LUE PICC  Neuro: Alert and oriented X 3 but slow pace of speech. Psych:  Pleasant affect. GU: Foley    Intake/Output Summary (Last 24 hours) at 07/31/13 0721 Last data filed at 07/31/13 2956  Gross per 24 hour  Intake 1643.08 ml  Output   4950 ml  Net -3306.92 ml    Inpatient Medications:  . albuterol  2.5 mg Nebulization BID  . antiseptic oral rinse  15 mL Mouth Rinse BID  . atorvastatin  20 mg Oral q1800  . cefTRIAXone (ROCEPHIN)  IV  1 g Intravenous Q24H  . cholecalciferol  1,000 Units Oral Daily  . citalopram  20 mg Oral Daily  . collagenase   Topical Daily  . diltiazem  180 mg Oral Daily  . enoxaparin (LOVENOX) injection  40 mg Subcutaneous Q24H  . feeding supplement (PRO-STAT SUGAR FREE 64)  30 mL Oral BID AC  . ferrous gluconate  324 mg Oral Q breakfast  . hydrALAZINE  25 mg Oral TID  . labetalol  150 mg Oral BID  . levothyroxine  25 mcg Oral QAC breakfast  . multivitamin with minerals  1 tablet Oral Daily  . potassium chloride  40 mEq  Oral BID  . sodium bicarbonate  650 mg Oral TID  . sodium chloride  3 mL Intravenous Q12H  . zinc sulfate  220 mg Oral Daily   Infusions:  . furosemide (LASIX) infusion 15 mg/hr (07/31/13 0219)    Labs:  Recent Labs  07/30/13 0855 07/31/13 0455  NA 145 142  K 4.0 4.1  CL 92* 90*  CO2 42* 43*  GLUCOSE 150* 139*  BUN 51* 52*  CREATININE 1.46* 1.48*  CALCIUM 9.8 9.8   No results found for this basename: AST, ALT, ALKPHOS, BILITOT, PROT, ALBUMIN,  in the last 72 hours  Recent Labs  07/30/13 0830 07/31/13 0455  WBC 7.4 7.9  HGB 9.2* 9.4*  HCT 29.5* 29.8*  MCV 96.7 97.1  PLT 199 217   No results found for this basename: CKTOTAL, CKMB, TROPONINI,  in the last 72 hours No components found with this basename: POCBNP,  No results found for this basename: HGBA1C,  in the last 72 hours     Assessment and Plan   1. Acute on chronic diastolic heart failure/anasarca with superimposed pulmonary HTN - Adm wt = 257. Today's weight = 198. Weight in December = 183-184. Volume status remains elevated but she is doing well on Lasix gtt.  Good UOP yesterday, will continue with Lasix gtt at 15 mg/hr today.  Creatinine is remaining stable.  Probably 1 or 2 more days of IV diuresis then back to SNF.   2. Chronic LEE - as above, lymphedema therapy by PT, wound care.  3. CKD stage III - stable. Creatinine baseline 1.5-1.6.     4. Chronic atrial fibrillation -  Reasonable rate control.  Not on Coumadin due to h/o rectus sheath hematoma 04/2013 requiring transfusion.  5. Morbid obesity with extreme deconditioning/failure to thrive - plan to return to SNF after DC. Appreciate PT.  6. Anemia - chronic disease, also rectus sheath hematoma 04/2013, stable.  7. HTN - stable.  8. Diabetes mellitus with diabetic nephropathy - CBGs not requiring any supp insulin at this time.  9. UTI - Complicated in setting of foley catheter.  Start ceftriaxone while awaiting culture data.    [name]  07/31/2013 7:21 AM

## 2013-08-01 LAB — BASIC METABOLIC PANEL
BUN: 51 mg/dL — ABNORMAL HIGH (ref 6–23)
CALCIUM: 9.7 mg/dL (ref 8.4–10.5)
CO2: 42 meq/L — AB (ref 19–32)
CREATININE: 1.4 mg/dL — AB (ref 0.50–1.10)
Chloride: 89 mEq/L — ABNORMAL LOW (ref 96–112)
GFR calc Af Amer: 40 mL/min — ABNORMAL LOW (ref >90)
GFR, EST NON AFRICAN AMERICAN: 35 mL/min — AB (ref >90)
Glucose, Bld: 115 mg/dL — ABNORMAL HIGH (ref 70–99)
Potassium: 3.7 mEq/L (ref 3.7–5.3)
Sodium: 142 mEq/L (ref 137–147)

## 2013-08-01 LAB — CBC
HCT: 28 % — ABNORMAL LOW (ref 36.0–46.0)
Hemoglobin: 8.8 g/dL — ABNORMAL LOW (ref 12.0–15.0)
MCH: 30.3 pg (ref 26.0–34.0)
MCHC: 31.4 g/dL (ref 30.0–36.0)
MCV: 96.6 fL (ref 78.0–100.0)
PLATELETS: 224 10*3/uL (ref 150–400)
RBC: 2.9 MIL/uL — AB (ref 3.87–5.11)
RDW: 14.9 % (ref 11.5–15.5)
WBC: 7.7 10*3/uL (ref 4.0–10.5)

## 2013-08-01 MED ORDER — DILTIAZEM HCL ER COATED BEADS 240 MG PO TB24: 240.0000 mg | ORAL_TABLET | Freq: Every day | ORAL | Status: AC

## 2013-08-01 MED ORDER — CIPROFLOXACIN HCL 250 MG PO TABS: 250.0000 mg | ORAL_TABLET | Freq: Two times a day (BID) | ORAL | Status: AC

## 2013-08-01 MED ORDER — HYDROCODONE-ACETAMINOPHEN 7.5-325 MG PO TABS: ORAL_TABLET | ORAL | Status: DC

## 2013-08-01 MED ORDER — POTASSIUM CHLORIDE CRYS ER 20 MEQ PO TBCR: 20.0000 meq | EXTENDED_RELEASE_TABLET | Freq: Two times a day (BID) | ORAL | Status: DC

## 2013-08-01 MED ORDER — FUROSEMIDE 80 MG PO TABS: 80.0000 mg | ORAL_TABLET | Freq: Every day | ORAL | Status: DC

## 2013-08-01 MED ORDER — FUROSEMIDE 40 MG PO TABS: 40.0000 mg | ORAL_TABLET | Freq: Every evening | ORAL | Status: DC

## 2013-08-01 MED ORDER — ACETAMINOPHEN 325 MG PO TABS: 650.0000 mg | ORAL_TABLET | ORAL | Status: DC | PRN

## 2013-08-01 MED ORDER — LABETALOL HCL 300 MG PO TABS: 150.0000 mg | ORAL_TABLET | Freq: Two times a day (BID) | ORAL | Status: DC

## 2013-08-01 MED ORDER — OXYCODONE HCL 5 MG PO TABS: 5.0000 mg | ORAL_TABLET | ORAL | Status: DC | PRN

## 2013-08-01 MED ORDER — ALPRAZOLAM 0.25 MG PO TABS: 0.2500 mg | ORAL_TABLET | Freq: Two times a day (BID) | ORAL | Status: DC | PRN

## 2013-08-01 MED ORDER — FUROSEMIDE 40 MG PO TABS
40.0000 mg | ORAL_TABLET | Freq: Every evening | ORAL | Status: DC
  Filled 2013-08-01: qty 1

## 2013-08-01 MED ORDER — CIPROFLOXACIN HCL 250 MG PO TABS
250.0000 mg | ORAL_TABLET | Freq: Two times a day (BID) | ORAL | Status: DC
  Administered 2013-08-01: 250 mg via ORAL
  Filled 2013-08-01 (×3): qty 1

## 2013-08-01 MED ORDER — FUROSEMIDE 80 MG PO TABS
80.0000 mg | ORAL_TABLET | Freq: Every day | ORAL | Status: DC
Start: 2013-08-01 — End: 2013-08-01
  Administered 2013-08-01: 80 mg via ORAL
  Filled 2013-08-01 (×2): qty 1

## 2013-08-01 NOTE — Progress Notes (Signed)
I have reviewed and agree with above note.  [name]. [place], [place]

## 2013-08-01 NOTE — Consult Note (Signed)
WOC contacted beside nurse to remove Unna's boots today and replace topical wound care for the open areas per the orders.  Coordinate replacement of the Unna's boots with orthopedic tech at her convenience.    Garen Woolbright Liane Comber RN,CWOCN [phone]

## 2013-08-01 NOTE — Discharge Summary (Signed)
Advanced Heart Failure Team  Discharge Summary   Patient ID: [name] MRN: [id number], DOB/AGE: [birth year] 80 y.o. Admit date: 07/20/2013 D/C date:     08/01/2013   PCP: Cathlean Cower, MD  Cardiologist: Dr. Minus Breeding   Primary Discharge Diagnoses:  1) A/C diastolic HF/anasarca with superimposed pulmonary HTN - Diuresed with lasix gtt 15 mg/hr and metolazone - Admit weight 257 lbs, discharge weight 190 lbs. Diuresed total 37 liters  Secondary Discharge Diagnoses:  1) Chronic LEE - Lymphedema therapy by PT 2) CKD, stage III - baseline Cr 1.5-1.6 3) Chronic atrial fibrillation 4) Anemia 5) HTN 6) Morbid Obesity with extreme deconditioning/failure to thrive  Hospital Course:  [name] is a 80 y.o. female with a h/o AFib, pulmonary HTN, right sided heart failure, DM2, HTN, HL and CKD. Last seen by Dr. Percival Spanish 02/2012. Echo (04/2013): Mild LVH, EF 60-65%, mild MAC, mild MR, mod LAE, mild RVE, normal RVF, mod RAE, mod TR, PASP 65.  She has been admitted to the hospital multiple times in the past 6 months with last 04/2013 with a moderate to large rectus sheath hematoma. She initially presented to the hospital with fatigue and lethargy. Hemoglobin was 6.1. She was transfused with PRBCs. She was given FFP to reverse her Coumadin. She was seen by general surgery. When last seen by Dr. Zella Richer on 05/14/13, he recommended to not restart Coumadin for one to 2 weeks. No surgical intervention was required.  Resided at [facility] and presented to the hospital on 07/20/13 with significant LE edema and volume overloaded. She was admitted for diuresis. She was diuresed with lasix gtt 15 mg/hr and metolazone with good UOP. A PICC was placed in order to monitor CVPs and she was transferred to the stepdown unit. She has chronic afib and during her stay her rate increase and her Cardizem was increased to 240 mg daily and her rate improved. PT/OT worked with the patient and a [place] consult was placed  for her chronic lymphedema. WOC recommended silver foam to the open wounds for exudate management, bio burden and to continue compression wraps with Unna's boots for the venous dermatitis, change weekly (Q Wednesday). She has a chronic foley and developed a UTI while in the hospital. She was started on Cetriaxone, culture showed mixed flora. Will continue Cipro for 2 weeks (end date 08/14/13).  On day of discharge her VSS stable. Will discharge back to SNF with close follow up in HF clinic next week. She diuresed a total of 37 liters and weight on discharge was 190 lbs. She will need daily weights while at SNF along with 2gm NA restriction and restrict fluids to 2L a day. Home meds as below.   Discharge Weight Range: 190-195 Discharge Vitals: Blood pressure 124/45, pulse 113, temperature 97.2 F (36.2 C), temperature source Oral, resp. rate 16, height 5\' 5"  (1.651 m), weight 190 lb 0.6 oz (86.2 kg), SpO2 97.00%.  Labs: Lab Results  Component Value Date   WBC 7.7 08/01/2013   HGB 8.8* 08/01/2013   HCT 28.0* 08/01/2013   MCV 96.6 08/01/2013   PLT 224 08/01/2013     Recent Labs Lab 08/01/13 0430  NA 142  K 3.7  CL 89*  CO2 42*  BUN 51*  CREATININE 1.40*  CALCIUM 9.7  GLUCOSE 115*   Lab Results  Component Value Date   CHOL 131 07/18/2012   HDL 61.80 07/18/2012   LDLCALC 59 07/18/2012   TRIG 52.0 07/18/2012  BNP (last 3 results)  Recent Labs  01/29/13 0530 05/10/13 1525 07/21/13 0415  PROBNP 3513.0* 3105.0* 4244.0*    Diagnostic Studies/Procedures   No results found.  Discharge Medications     Medication List    STOP taking these medications       diltiazem 120 MG 24 hr capsule  Commonly known as:  CARDIZEM CD  Replaced by:  diltiazem 240 MG 24 hr tablet     hydrALAZINE 50 MG tablet  Commonly known as:  APRESOLINE     potassium chloride 10 MEQ tablet  Commonly known as:  K-DUR      TAKE these medications       acetaminophen 325 MG tablet  Commonly known as:   TYLENOL  Take 2 tablets (650 mg total) by mouth every 4 (four) hours as needed for headache or mild pain.     ALPRAZolam 0.25 MG tablet  Commonly known as:  XANAX  Take 0.25 mg by mouth 2 (two) times daily as needed for anxiety.     ALPRAZolam 0.25 MG tablet  Commonly known as:  XANAX  Take 1 tablet (0.25 mg total) by mouth 2 (two) times daily as needed for anxiety.     atorvastatin 20 MG tablet  Commonly known as:  LIPITOR  Take 1 tablet (20 mg total) by mouth daily at 6 PM.     bisacodyl 10 MG suppository  Commonly known as:  DULCOLAX  Place 10 mg rectally daily as needed for moderate constipation.     ciprofloxacin 250 MG tablet  Commonly known as:  CIPRO  Take 1 tablet (250 mg total) by mouth 2 (two) times daily.     citalopram 20 MG tablet  Commonly known as:  CELEXA  Take 20 mg by mouth daily.     diltiazem 240 MG 24 hr tablet  Commonly known as:  CARDIZEM LA  Take 1 tablet (240 mg total) by mouth daily.     docusate sodium 100 MG capsule  Commonly known as:  COLACE  Take 100 mg by mouth 2 (two) times daily as needed for mild constipation.     ferrous gluconate 325 MG tablet  Commonly known as:  FERGON  Take 325 mg by mouth daily with breakfast.     furosemide 40 MG tablet  Commonly known as:  LASIX  Take 1 tablet (40 mg total) by mouth every evening.     furosemide 80 MG tablet  Commonly known as:  LASIX  Take 1 tablet (80 mg total) by mouth daily with breakfast.     HYDROcodone-acetaminophen 7.5-325 MG per tablet  Commonly known as:  NORCO  Take one tablet by mouth four times daily for pain     insulin lispro 100 UNIT/ML injection  Commonly known as:  HUMALOG  Inject 0-8 Units into the skin 4 (four) times daily -  before meals and at bedtime. 201-250=2units, 251-300=4 units, 301=35-=6units, 351-400=8units     labetalol 300 MG tablet  Commonly known as:  NORMODYNE  Take 0.5 tablets (150 mg total) by mouth 2 (two) times daily.     levothyroxine 25 MCG  tablet  Commonly known as:  SYNTHROID, LEVOTHROID  Take 25 mcg by mouth daily before breakfast.     multivitamin with minerals tablet  Take 1 tablet by mouth daily.     ondansetron 4 MG tablet  Commonly known as:  ZOFRAN  Take 4 mg by mouth every 8 (eight) hours as needed for nausea or vomiting.  oxyCODONE 5 MG immediate release tablet  Commonly known as:  Oxy IR/ROXICODONE  Take 1 tablet (5 mg total) by mouth every 4 (four) hours as needed for severe pain (prior to dressing change).     potassium chloride SA 20 MEQ tablet  Commonly known as:  K-DUR,KLOR-CON  Take 1 tablet (20 mEq total) by mouth 2 (two) times daily.     sodium bicarbonate 650 MG tablet  Take 1 tablet (650 mg total) by mouth 3 (three) times daily.     Vitamin D 1000 UNITS capsule  Take 1,000 Units by mouth daily.     zinc sulfate 220 MG capsule  Take 220 mg by mouth daily.        Disposition   The patient will be discharged in stable condition to home. Discharge Orders   Future Appointments Provider Department Dept Phone   08/08/2013 11:20 AM [facility] [place] [phone]   08/14/2013 10:45 AM Biagio Borg, MD [place] [phone]   Future Orders Complete By Expires   Beta Blocker already ordered  As directed    Contraindication to ACEI at discharge  As directed    Scheduling Instructions:     Diastolic HF   Diet - low sodium heart healthy  As directed    Heart Failure patients record your daily weight using the same scale at the same time of day  As directed    Heart failure skilled nursing facility orders  As directed    Comments:     Heart Failure Follow-up Care: Verify follow-up appointments per Patient Discharge Instructions. Arrange transportation. Reconcile home medications with discharge medication list.  Assessments:  Vital signs and oxygen saturation daily and may decrease frequency as patient condition  stabilizes. If being transitioned from SNF facility to home, assess home environment for safety concerns, caregiver support and availability of low-sodium foods. Consult Surveyor, mining based on assessments. Assess for increased dyspnea, orthopnea, chest discomfort or chest pain, presence of rales/crackles, perripheral edema, abdominal distention, and/or weight gain of 3 pounds in one day or 5 pounds in one week. Daily Weights and Symptom Monitoring: Weigh daily before breakfast and after voiding using same scale and record weights on HF Zone Tool/Weight Chart to track weights with symptoms. Standing scale preferred. Call Physician/AHF Clinic for weight gain of 3 pounds or more in 24 hours or 5 pounds in one week, OR worsening symptoms as noted above. Activity:  Develop individualized activity plan with patient/caregiver. Patient Education: Teach patient/caregiver to weigh daily and record on weight chart. Reinforce the 5 Key Messages in the "Living with Heart Failure" book. Reinforce use of HF Zone Tool to support early symptom recognition and appropriate action. Reinforce low salt diet. Reinforce fluid restriction as ordered.   Questions:     Heart Failure Follow-up Care:  Advanced Heart Failure (AHF) Clinic at [place] the following labs:  Other see comments   Lab frequency:  Other see comments   Fax lab results to:  AHF Clinic at [phone]   Daily Weights and Symptom Monitoring:  Fax weight chart weekly X 2 to AHF Clinic at [phone]   Diet:  No Added Salt   PT/OT Consult:  Yes   Activity:  As tolerated   Fluid restrictions:  2000 mL Fluid   Increase activity slowly  As directed    STOP any activity that causes chest pain, shortness of breath, dizziness, sweating, or  exessive weakness  As directed      Follow-up Information   Follow up with [name], MD On 08/09/2013. (@ 11:20 am; [place])    Specialty:  Cardiology   Contact information:   [address] [place] [id number] [phone]         Duration of Discharge Encounter: Greater than 35 minutes   Signed, Rande Brunt NP-C 08/01/2013, 1:42 PM

## 2013-08-01 NOTE — Progress Notes (Signed)
I cosign with Laura Caldwell Student RN on all assessments, medication administration, intake and output, etc for this shift. Jennise Both A, RN  

## 2013-08-01 NOTE — Progress Notes (Signed)
Patient's condition and d/c plan was updated with admissions at [facility].  No change in d/c plan. Awaiting stability per MD for d/c to SNF.  Lorie Phenix. [place], [place]

## 2013-08-01 NOTE — Progress Notes (Addendum)
Patient ID: [name], female   DOB: Feb 28, 1932, 80 y.o.   MRN: [id number]     [name] / Admit Date: 07/20/2013 / Date of Encounter: 08/01/2013, 7:57 AM   Subjective  Patient diuresed well.  No further leg pain this morning.  Breathing overall better.   Objective   Telemetry:   Physical Exam: Blood pressure 117/63, pulse 92, temperature 97.5 F (36.4 C), temperature source Oral, resp. rate 18, height 5\' 5"  (1.651 m), weight 190 lb 0.6 oz (86.2 kg), SpO2 100.00%. General: Well developed chronically ill overweight F in NAD. Head: Normocephalic, atraumatic, sclera non-icteric, no xanthomas, nares are without discharge. Neck: JVP 8 cm. Lungs: Coarse BS throughout. Breathing is unlabored. Heart: Irreg, irreg, S1 S2 without murmurs, rubs, or gallops.  Abdomen: Obese, Soft, rounded, normoactive bowel sounds. No rebound/guarding. Extremities: No clubbing or cyanosis. LEs are now compressed with ACE wraps. LUE PICC  Neuro: Alert and oriented X 3 but slow pace of speech. Psych:  Pleasant affect. GU: Foley    Intake/Output Summary (Last 24 hours) at 08/01/13 0757 Last data filed at 08/01/13 0612  Gross per 24 hour  Intake    890 ml  Output   6001 ml  Net  -5111 ml    Inpatient Medications:  . albuterol  2.5 mg Nebulization BID  . antiseptic oral rinse  15 mL Mouth Rinse BID  . atorvastatin  20 mg Oral q1800  . cholecalciferol  1,000 Units Oral Daily  . ciprofloxacin  250 mg Oral BID  . citalopram  20 mg Oral Daily  . collagenase   Topical Daily  . diltiazem  180 mg Oral Daily  . enoxaparin (LOVENOX) injection  40 mg Subcutaneous Q24H  . feeding supplement (PRO-STAT SUGAR FREE 64)  30 mL Oral BID AC  . ferrous gluconate  324 mg Oral Q breakfast  . furosemide  40 mg Oral QPM  . furosemide  80 mg Oral Q breakfast  . hydrALAZINE  25 mg Oral TID  . labetalol  150 mg Oral BID  . levothyroxine  25 mcg Oral QAC breakfast  . multivitamin with minerals  1 tablet Oral Daily    . potassium chloride  40 mEq Oral BID  . sodium bicarbonate  650 mg Oral TID  . sodium chloride  3 mL Intravenous Q12H  . zinc sulfate  220 mg Oral Daily   Infusions:     Labs:  Recent Labs  07/31/13 0455 08/01/13 0430  NA 142 142  K 4.1 3.7  CL 90* 89*  CO2 43* 42*  GLUCOSE 139* 115*  BUN 52* 51*  CREATININE 1.48* 1.40*  CALCIUM 9.8 9.7   No results found for this basename: AST, ALT, ALKPHOS, BILITOT, PROT, ALBUMIN,  in the last 72 hours  Recent Labs  07/31/13 0455 08/01/13 0430  WBC 7.9 7.7  HGB 9.4* 8.8*  HCT 29.8* 28.0*  MCV 97.1 96.6  PLT 217 224   No results found for this basename: CKTOTAL, CKMB, TROPONINI,  in the last 72 hours No components found with this basename: POCBNP,  No results found for this basename: HGBA1C,  in the last 72 hours     Assessment and Plan   1. Acute on chronic diastolic heart failure/anasarca with superimposed pulmonary HTN - Adm wt = 257. Today's weight = 190.  Volume status significantly improved.  I am going to transition her over to Lasix 80 qam, 40 qpm.   2. Chronic LEE - Lymphedema  therapy by PT, wound care.  3. CKD stage III - stable. Creatinine baseline 1.5-1.6.     4. Chronic atrial fibrillation -  Reasonable rate control.  Not on Coumadin due to h/o rectus sheath hematoma 04/2013 requiring transfusion.  5. Morbid obesity with extreme deconditioning/failure to thrive - plan to return to SNF after DC. Appreciate PT.  6. Anemia - chronic disease, also rectus sheath hematoma 04/2013, stable.  7. HTN - stable.  8. Diabetes mellitus with diabetic nephropathy - CBGs not requiring any supp insulin at this time.  9. UTI - Complicated in setting of foley catheter.  Culture with mixed flora.  Will transition to Cipro, continue for 2 wks total antibiotics as she has chronic foley.   10. Disposition - Patient may go back to SNF today.  Meds:  Lasix 80 qam + 40 qpm, KCl 20 bid, atorvastatin 20, ASA 81, diltiazem CD 240 daily,  labetalol 150 bid (can stop hydralazine with increase in diltiazem CD), noncardiac meds as prior to admission except Cipro 250 bid to continue total of 2 wks abx.  Needs followup CHF clinic in 1 week.   [name] 08/01/2013 7:57 AM

## 2013-08-01 NOTE — Progress Notes (Signed)
Pt discharged by ambulance to [facility], reported called to [facility].

## 2013-08-01 NOTE — Progress Notes (Signed)
NUTRITION FOLLOW UP  Intervention:   Continue Pro-stat BID, each dose provides 15 grams of protein and 100 kcal Continue Multivitamin daily   Nutrition Dx:   Increased nutrient needs related to wound healing and severe edema as evidenced by pt's chart; ongoing  Goal:   Pt to meet >/= 90% of their estimated nutrition needs; being met  Monitor:   PO intake; improved to 75% to 100% meal completion Weight trends; 69 lb weight loss since admission; pt remains 27 lbs above reported usual body weight Labs; high BUN, elevated creatinine, low GFR, low hemoglobin  Assessment:   80 y.o. female with a h/o AFib, pulmonary HTN, right sided heart failure, DM2, HTN, HL and CKD. She lives at [facility]. Pt with significant LE edema with significant desquamation of bilateral LEs.   Per nursing notes, pt is now eating 75-100% of most meals. Pt's weight has dropped 69 lbs since admission; edema has improved. Pt remains above her usual body weight.  Pt's BMI based on current weight is 31.6 kg/(m^2)- pt meets criteria for Obesity. Pt reports usual body weight of 163 lbs which would give her a BMI of 27.1 kg/(m^2) and would categorize her as overweight.   Height: Ht Readings from Last 1 Encounters:  07/20/13 '5\' 5"'  (1.651 m)    Weight Status:   Wt Readings from Last 1 Encounters:  08/01/13 190 lb 0.6 oz (86.2 kg)    Re-estimated needs:  Kcal: 1600-1800  Protein: 105-120 grams  Fluid: 1.8- 2 L/day  Skin: +2 Generalized edema, +2 RUE edema, +1 LUE edema; stage 2 pressure ulcer on left buttocks; stage 2 pressure ulcer on right thigh   Diet Order: Cardiac   Intake/Output Summary (Last 24 hours) at 08/01/13 1143 Last data filed at 08/01/13 0086  Gross per 24 hour  Intake    720 ml  Output   5501 ml  Net  -4781 ml    Last BM: 2/2   Labs:   Recent Labs Lab 07/30/13 0855 07/31/13 0455 08/01/13 0430  NA 145 142 142  K 4.0 4.1 3.7  CL 92* 90* 89*  CO2 42* 43* 42*  BUN 51* 52*  51*  CREATININE 1.46* 1.48* 1.40*  CALCIUM 9.8 9.8 9.7  GLUCOSE 150* 139* 115*    CBG (last 3)  No results found for this basename: GLUCAP,  in the last 72 hours  Scheduled Meds: . albuterol  2.5 mg Nebulization BID  . antiseptic oral rinse  15 mL Mouth Rinse BID  . atorvastatin  20 mg Oral q1800  . cholecalciferol  1,000 Units Oral Daily  . ciprofloxacin  250 mg Oral BID  . citalopram  20 mg Oral Daily  . collagenase   Topical Daily  . diltiazem  180 mg Oral Daily  . enoxaparin (LOVENOX) injection  40 mg Subcutaneous Q24H  . feeding supplement (PRO-STAT SUGAR FREE 64)  30 mL Oral BID AC  . ferrous gluconate  324 mg Oral Q breakfast  . furosemide  40 mg Oral QPM  . furosemide  80 mg Oral Q breakfast  . hydrALAZINE  25 mg Oral TID  . labetalol  150 mg Oral BID  . levothyroxine  25 mcg Oral QAC breakfast  . multivitamin with minerals  1 tablet Oral Daily  . potassium chloride  40 mEq Oral BID  . sodium bicarbonate  650 mg Oral TID  . sodium chloride  3 mL Intravenous Q12H  . zinc sulfate  220 mg Oral  Daily    Continuous Infusions:   Pryor Ochoa RD, LDN Inpatient Clinical Dietitian Pager: [phone] After Hours Pager: [phone]

## 2013-08-02 ENCOUNTER — Other Ambulatory Visit: Payer: Self-pay | Admitting: *Deleted

## 2013-08-02 MED ORDER — OXYCODONE HCL 5 MG PO TABS: 5.0000 mg | ORAL_TABLET | ORAL | Status: DC | PRN

## 2013-08-02 MED ORDER — HYDROCODONE-ACETAMINOPHEN 7.5-325 MG PO TABS: ORAL_TABLET | ORAL | Status: DC

## 2013-08-02 NOTE — Telephone Encounter (Signed)
[facility] 

## 2013-08-02 NOTE — Telephone Encounter (Signed)
[facility]

## 2013-08-07 ENCOUNTER — Non-Acute Institutional Stay (SKILLED_NURSING_FACILITY): Payer: Medicare Other | Admitting: Internal Medicine

## 2013-08-07 ENCOUNTER — Encounter: Payer: Self-pay | Admitting: Internal Medicine

## 2013-08-07 DIAGNOSIS — E1129 Type 2 diabetes mellitus with other diabetic kidney complication: Secondary | ICD-10-CM

## 2013-08-07 DIAGNOSIS — I4891 Unspecified atrial fibrillation: Secondary | ICD-10-CM

## 2013-08-07 DIAGNOSIS — I509 Heart failure, unspecified: Secondary | ICD-10-CM

## 2013-08-07 DIAGNOSIS — N183 Chronic kidney disease, stage 3 unspecified: Secondary | ICD-10-CM

## 2013-08-07 DIAGNOSIS — I482 Chronic atrial fibrillation, unspecified: Secondary | ICD-10-CM

## 2013-08-07 NOTE — Progress Notes (Addendum)
Patient ID: [name], female   DOB: 1931/08/15, 80 y.o.   MRN: [id number]          PROGRESS NOTE  DATE: 05/31/2014     FACILITY:  [facility]  LEVEL OF CARE: SNF (31)  Acute Visit  CHIEF COMPLAINT:  Manage lower extremity pain, anemia of chronic disease, and depression.    HISTORY OF PRESENT ILLNESS: I was requested by the staff to assess the patient regarding above problem(s):  LOWER EXTREMITY PAIN:  Patient is complaining of uncontrolled lower extremity pain despite her current pain medication regimen.  She has chronic venous insufficiency.  Currently, her lower extremities have wraps.    ANEMIA: The anemia is unstable. The patient denies fatigue, melena or hematochezia. No complications from the medications currently being used.  On 05/28/2013:  Hemoglobin 9.3, MCV 95.  On 05/18/2013:  Hemoglobin 9.6.    DEPRESSION:  Patient is currently not on an antidepressant.  I was requested by the pharmacy consultant to assess the patient for re-initiation of Celexa, which she was previously on.  Patient does admit to depressive symptoms.  Staff do not report behavioral problems.    PAST MEDICAL HISTORY : Reviewed.  No changes.  CURRENT MEDICATIONS: Reviewed per [facility]  REVIEW OF SYSTEMS:  GENERAL: no change in appetite, no fatigue, no weight changes, no fever, chills or weakness RESPIRATORY: no cough, SOB, DOE,, wheezing, hemoptysis CARDIAC: no chest pain or palpitations;  chronic lower extremity swelling    GI: no abdominal pain, diarrhea, constipation, heart burn, nausea or vomiting  PHYSICAL EXAMINATION  VS:  T 99.1       P 70      RR 20      BP 140/64       GENERAL: no acute distress, morbidly obese body habitus EYES: conjunctivae normal, sclerae normal, normal eye lids NECK: supple, trachea midline, no neck masses, no thyroid tenderness, no thyromegaly LYMPHATICS: no LAN in the neck, no supraclavicular LAN RESPIRATORY: breathing is even & unlabored, BS  CTAB CARDIAC: RRR, no murmur,no extra heart sounds EDEMA/VARICOSITIES: bilateral lower extremities are edematous and wrapped   GI: abdomen soft, normal BS, no masses, no tenderness, no hepatomegaly, no splenomegaly PSYCHIATRIC: the patient is alert & oriented to person, affect & behavior appropriate  ASSESSMENT/PLAN:  Bilateral lower extremity pain.  Uncontrolled problem.  Increase Vicodin to 7.5/325 q.i.d.    Anemia of chronic kidney disease.  Unstable problem.  Hemoglobin declined.  Continue iron and monitor.     Depression.  Patient has ongoing symptoms.  Restart Celexa 20 mg q.d.    Atrial fibrillation.  We will request cardiologist to assess the patient for re-initiation of Coumadin.    THN Metrics:  Not on aspirin.  Former smoker.     CPT CODE: 62035

## 2013-08-08 ENCOUNTER — Ambulatory Visit (HOSPITAL_COMMUNITY)
Admit: 2013-08-08 | Discharge: 2013-08-08 | Disposition: A | Discharge status: Home / Self Care | Payer: Medicare Other | Attending: Internal Medicine | Admitting: Internal Medicine

## 2013-08-08 VITALS — BP 122/68 | HR 120

## 2013-08-08 DIAGNOSIS — N183 Chronic kidney disease, stage 3 unspecified: Secondary | ICD-10-CM

## 2013-08-08 DIAGNOSIS — I5032 Chronic diastolic (congestive) heart failure: Secondary | ICD-10-CM | POA: Insufficient documentation

## 2013-08-08 DIAGNOSIS — N189 Chronic kidney disease, unspecified: Secondary | ICD-10-CM | POA: Insufficient documentation

## 2013-08-08 DIAGNOSIS — Z9981 Dependence on supplemental oxygen: Secondary | ICD-10-CM | POA: Insufficient documentation

## 2013-08-08 DIAGNOSIS — E119 Type 2 diabetes mellitus without complications: Secondary | ICD-10-CM | POA: Insufficient documentation

## 2013-08-08 DIAGNOSIS — Z993 Dependence on wheelchair: Secondary | ICD-10-CM | POA: Insufficient documentation

## 2013-08-08 DIAGNOSIS — E785 Hyperlipidemia, unspecified: Secondary | ICD-10-CM | POA: Insufficient documentation

## 2013-08-08 DIAGNOSIS — I509 Heart failure, unspecified: Secondary | ICD-10-CM | POA: Insufficient documentation

## 2013-08-08 DIAGNOSIS — I129 Hypertensive chronic kidney disease with stage 1 through stage 4 chronic kidney disease, or unspecified chronic kidney disease: Secondary | ICD-10-CM | POA: Insufficient documentation

## 2013-08-08 DIAGNOSIS — I482 Chronic atrial fibrillation, unspecified: Secondary | ICD-10-CM

## 2013-08-08 DIAGNOSIS — S301XXA Contusion of abdominal wall, initial encounter: Secondary | ICD-10-CM

## 2013-08-08 DIAGNOSIS — I4891 Unspecified atrial fibrillation: Secondary | ICD-10-CM | POA: Insufficient documentation

## 2013-08-08 DIAGNOSIS — Z79899 Other long term (current) drug therapy: Secondary | ICD-10-CM | POA: Insufficient documentation

## 2013-08-08 DIAGNOSIS — Z794 Long term (current) use of insulin: Secondary | ICD-10-CM | POA: Insufficient documentation

## 2013-08-08 LAB — BASIC METABOLIC PANEL
BUN: 57 mg/dL — ABNORMAL HIGH (ref 6–23)
CO2: 36 meq/L — AB (ref 19–32)
CREATININE: 1.45 mg/dL — AB (ref 0.50–1.10)
Calcium: 10 mg/dL (ref 8.4–10.5)
Chloride: 104 mEq/L (ref 96–112)
GFR calc non Af Amer: 33 mL/min — ABNORMAL LOW (ref >90)
GFR, EST AFRICAN AMERICAN: 39 mL/min — AB (ref >90)
Glucose, Bld: 223 mg/dL — ABNORMAL HIGH (ref 70–99)
Potassium: 3.8 mEq/L (ref 3.7–5.3)
SODIUM: 155 meq/L — AB (ref 137–147)

## 2013-08-08 LAB — PRO B NATRIURETIC PEPTIDE: Pro B Natriuretic peptide (BNP): 7263 pg/mL — ABNORMAL HIGH (ref 0–450)

## 2013-08-09 NOTE — Progress Notes (Signed)
Patient ID: LEVETTA BOGNAR, female   DOB: [birth year], 80 y.o.   MRN: [id number] PCP: Dr. Durwin Reges Primary Cardiologist: Dr. Percival Spanish  80 yo with history of chronic atrial fibrillation, CKD, and chronic diastolic CHF with component of RV failure presents for cardiology followup.  This is her first visit to CHF clinic.  Patient is essentially wheelchair-bound and lives in an nursing home.  I suspect there is underlying dementia and history is difficult (was difficult in the hospital as well).  Seh was admitted in 1/15 with acute/chronic diastolic CHF with RV failure and anasarca.  EF was 60-65% by echo with PA systolic pressure 65 mmHg.  She was diuresed with Lasix gtt and metolazone, total net 37 L negative with > 50 lbs weight loss. She is now back at the nursing home.   Today, her legs are wrapped and remain quite improved in terms of swelling.  I am concerned today that her HR is up in the 120s.  She has a history of chronic atrial fibrillation.  She is not anticoagulated due to a rectus sheath hematoma in 11/14.  She is at high fall risk.  She is not very active and seems to be mostly confined to the wheelchair.  History is difficult due to probable component of at least mild dementia.  She has no family here today.  She continues to wear 2 L home oxygen.   ECG: Atrial fibrillation at 126 bpm, LAFB with RBBB  Labs (2/15): K 3.7, creatinine 1.4   PMH: 1. Chronic atrial fibrillation.  She is off anticoagulation due to rectus sheath hematoma in 11/14.  2. Type II diabetes 3. HTN 4. CKD 5. Chronic diastolic CHF with component of RV failure.  She is on chronic 2 L O2 by nasal cannula.  Echo (11/14) with EF 60-65%, mild LVH, moderate TR, PA systolic pressure 65 mmHg.   6. Hyperlipidemia. 7. Suspect dementia  SH: Lives in [place] in [place].  No smoking.    FH: No premature CAD.    ROS: All systems reviewed and negative except as per HPI.   Current Outpatient Prescriptions  Medication Sig  Dispense Refill  . acetaminophen (TYLENOL) 325 MG tablet Take 2 tablets (650 mg total) by mouth every 4 (four) hours as needed for headache or mild pain.      Marland Kitchen ALPRAZolam (XANAX) 0.25 MG tablet Take 0.25 mg by mouth 2 (two) times daily as needed for anxiety.      Marland Kitchen atorvastatin (LIPITOR) 20 MG tablet Take 1 tablet (20 mg total) by mouth daily at 6 PM.  30 tablet  0  . bisacodyl (DULCOLAX) 10 MG suppository Place 10 mg rectally daily as needed for moderate constipation.      . Cholecalciferol (VITAMIN D) 1000 UNITS capsule Take 1,000 Units by mouth daily.       . ciprofloxacin (CIPRO) 250 MG tablet Take 1 tablet (250 mg total) by mouth 2 (two) times daily.      . citalopram (CELEXA) 20 MG tablet Take 20 mg by mouth daily.      Marland Kitchen diltiazem (CARDIZEM LA) 240 MG 24 hr tablet Take 1 tablet (240 mg total) by mouth daily.      Marland Kitchen docusate sodium (COLACE) 100 MG capsule Take 100 mg by mouth 2 (two) times daily as needed for mild constipation.      . ferrous gluconate (FERGON) 325 MG tablet Take 325 mg by mouth daily with breakfast.      . furosemide (LASIX)  40 MG tablet Take 1 tablet (40 mg total) by mouth every evening.  30 tablet    . furosemide (LASIX) 80 MG tablet Take 1 tablet (80 mg total) by mouth daily with breakfast.      . HYDROcodone-acetaminophen (NORCO) 7.5-325 MG per tablet Take one tablet by mouth four times daily for pain  120 tablet  0  . insulin lispro (HUMALOG) 100 UNIT/ML injection Inject 0-8 Units into the skin 4 (four) times daily -  before meals and at bedtime. 201-250=2units, 251-300=4 units, 301=35-=6units, 351-400=8units      . labetalol (NORMODYNE) 300 MG tablet Take 0.5 tablets (150 mg total) by mouth 2 (two) times daily.      Marland Kitchen levothyroxine (SYNTHROID, LEVOTHROID) 25 MCG tablet Take 25 mcg by mouth daily before breakfast.      . Multiple Vitamins-Minerals (MULTIVITAMIN WITH MINERALS) tablet Take 1 tablet by mouth daily.      . ondansetron (ZOFRAN) 4 MG tablet Take 4 mg by  mouth every 8 (eight) hours as needed for nausea or vomiting.      Marland Kitchen oxyCODONE (OXY IR/ROXICODONE) 5 MG immediate release tablet Take 1 tablet (5 mg total) by mouth every 4 (four) hours as needed for severe pain (prior to dressing change).  180 tablet  0  . potassium chloride SA (K-DUR,KLOR-CON) 20 MEQ tablet Take 1 tablet (20 mEq total) by mouth 2 (two) times daily.  60 tablet    . sodium bicarbonate 650 MG tablet Take 1 tablet (650 mg total) by mouth 3 (three) times daily.      Marland Kitchen zinc sulfate 220 MG capsule Take 220 mg by mouth daily.       No current facility-administered medications for this encounter.    BP 122/68  Pulse 120  SpO2 99% General: NAD Neck: JVP 8 cm, no thyromegaly or thyroid nodule.  Lungs: Clear to auscultation bilaterally with normal respiratory effort. CV: Nondisplaced PMI.  Heart tachy, irregular S1/S2, no S3/S4, no murmur.  Lower legs wrapped with minimal edema.  No carotid bruit.  Normal pedal pulses.  Abdomen: Soft, nontender, no hepatosplenomegaly, no distention.  Skin: Intact without lesions or rashes.  Neurologic: Alert and oriented x 3.  Psych: Normal affect. Extremities: No clubbing or cyanosis.   Assessment/Plan: 1. Chronic atrial fibrillation: HR is elevated today.  She is not on anticoagulation due to fall risk and rectus sheath hematoma in 11/14.  EF is normal. - Continue current labetalol - I will increase diltiazem CD to 360 mg daily.  - At followup, could consider transitioning from labetalol to Toprol XL if rate is still not adequately controlled.  2. Chronic diastolic CHF with component of RV failure.  She probably has some mild residual volume overload but lost copious weight in the hospital during the last admission.  Creatinine remained stable.  - Continue current Lasix and KCl.  - BMET/BNP today.  3. CKD: As above, will check BMET today.  Creatinine stayed relatively stable with extensive recent diuresis.  4. Suspect dementia: Makes history  difficult.  Hopefully family can come with her in the future.   Followup in 10 days.   [name] 08/09/2013

## 2013-08-11 DIAGNOSIS — N183 Chronic kidney disease, stage 3 unspecified: Secondary | ICD-10-CM | POA: Insufficient documentation

## 2013-08-11 DIAGNOSIS — E1129 Type 2 diabetes mellitus with other diabetic kidney complication: Secondary | ICD-10-CM | POA: Insufficient documentation

## 2013-08-11 NOTE — Progress Notes (Signed)
HISTORY & PHYSICAL  DATE: 08/07/2013   FACILITY: [facility]  LEVEL OF CARE: SNF (31)  ALLERGIES:  Allergies  Allergen Reactions  . Clonidine Derivatives Itching and Rash    Skin breaks out    CHIEF COMPLAINT:  Manage CHF, chronic kidney disease stage III and atrial fibrillation  HISTORY OF PRESENT ILLNESS: Patient is an 80 year old African American female was hospitalized secondary to CHF exacerbation. After hospitalization patient is readmitted to this facility for long-term care management.  CHF:The patient does not relate significant weight changes, denies sob, DOE, orthopnea, PNDs, palpitations or chest pain.  CHF remains stable.  No complications form the medications being used. Complains of chronic lower extremity swelling.  ATRIAL FIBRILLATION: the patients atrial fibrillation remains stable.  The patient denies DOE, tachycardia, orthopnea, transient neurological sx, palpitations, & PNDs.  No complications noted from the medications currently being used.  CHRONIC KIDNEY DISEASE: The patient's chronic kidney disease remains stable.  Patient denies increasing lower extremity swelling or confusion. Last BUN and creatinine are: 51 and 1.4.  PAST MEDICAL HISTORY :  Past Medical History  Diagnosis Date  . Obesity   . Anemia, iron deficiency   . Depression   . Diabetes mellitus type II   . Hypertension   . Peptic ulcer   . History of uterine fibroid   . Hx of colonic polyps   . Diverticula, colon   . Osteoarthritis   . Low back pain   . Pulmonary hypertension     a. echo 4/12:  EF 55-60%, mod LVH, mild BAE, RVE and PASP 51;  b.  Echo (04/2013):  Mild LVH, EF 60-65%, mild MAC, mild MR, mod LAE, mild RVE, normal RVF, mod RAE, mod TR, PASP 65  . Anxiety   . HYPERLIPIDEMIA 03/05/2007  . ANEMIA-IRON DEFICIENCY 03/05/2007  . Right-sided heart failure 03/05/2007  . LOW BACK PAIN 03/05/2007  . Chronic pain syndrome 06/23/2010  . Hypothyroidism 10/15/2010    . CKD (chronic kidney disease) stage 3, GFR 30-59 ml/min 10/15/2010  . Anemia of chronic disease 05/11/2013  . Atrial fibrillation     permanent, on coumadin;  coumadin stopped at time of spontaneous rectus sheat hematoma 04/2013  . Anasarca   . CHF (congestive heart failure)     PAST SURGICAL HISTORY: Past Surgical History  Procedure Laterality Date  . Laparoscopic hysterectomy    . Colonoscopy  2009    SOCIAL HISTORY:  reports that she quit smoking about 40 years ago. She started smoking about 41 years ago. She has never used smokeless tobacco. She reports that she does not drink alcohol or use illicit drugs.  FAMILY HISTORY:  Family History  Problem Relation Age of Onset  . Hypertension Sister     CURRENT MEDICATIONS: Reviewed per [facility]  REVIEW OF SYSTEMS:  See HPI otherwise 14 point ROS is negative.  PHYSICAL EXAMINATION  VS:  T 98.7        P 64      RR 20      BP 128/82      POX% 97         GENERAL: no acute distress, morbidly obese body habitus EYES: conjunctivae normal, sclerae normal, normal eye lids MOUTH/THROAT: lips without lesions,no lesions in the mouth,tongue is without lesions,uvula elevates in midline NECK: supple, trachea midline, no neck masses, no thyroid tenderness, no thyromegaly LYMPHATICS: no LAN in the neck, no supraclavicular LAN RESPIRATORY: breathing is even &  unlabored, BS CTAB CARDIAC: Heart rate irregularly irregular, no murmur,no extra heart sounds, bilateral lower extremities edematous and  wrapped GI:  ABDOMEN: abdomen soft, normal BS, no masses, no tenderness  LIVER/SPLEEN: no hepatomegaly, no splenomegaly MUSCULOSKELETAL: HEAD: normal to inspection & palpation BACK: no kyphosis, scoliosis or spinal processes tenderness EXTREMITIES: Unable to perform PSYCHIATRIC: the patient is alert & oriented to person, affect & behavior appropriate  LABS/RADIOLOGY:  Labs reviewed: Basic Metabolic Panel:  Recent Labs  07/25/13 0432   07/31/13 0455 08/01/13 0430 08/08/13 1245  NA 143  < > 142 142 155*  K 3.7  < > 4.1 3.7 3.8  CL 95*  < > 90* 89* 104  CO2 35*  < > 43* 42* 36*  GLUCOSE 119*  < > 139* 115* 223*  BUN 40*  < > 52* 51* 57*  CREATININE 1.65*  < > 1.48* 1.40* 1.45*  CALCIUM 9.5  < > 9.8 9.7 10.0  MG 1.8  --   --   --   --   < > = values in this interval not displayed. Liver Function Tests:  Recent Labs  04/05/13 1619 05/12/13 0200 07/21/13 0415  AST 18 18 24   ALT 21 19 17   ALKPHOS 94 115 98  BILITOT 0.3 1.0 0.4  PROT 6.1 5.2* 5.4*  ALBUMIN 3.0* 2.5* 2.5*   CBC:  Recent Labs  04/05/13 1619  04/21/13 1240  07/21/13 0415  07/30/13 0830 07/31/13 0455 08/01/13 0430  WBC 5.3  --  8.3  < > 4.4  < > 7.4 7.9 7.7  NEUTROABS 4.3  --  7.2  --  3.0  --   --   --   --   HGB 9.0*  < > 8.8*  < > 9.5*  < > 9.2* 9.4* 8.8*  HCT 27.5*  < > 27.1*  < > 29.6*  < > 29.5* 29.8* 28.0*  MCV 93.5  --  92.2  < > 94.9  < > 96.7 97.1 96.6  PLT 249  --  199  < > 157  < > 199 217 224  < > = values in this interval not displayed.  Cardiac Enzymes:  Recent Labs  01/26/13 1659 04/02/13 1205 04/05/13 1619  TROPONINI <0.30 <0.30 <0.30   CBG:  Recent Labs  07/23/13 1607 07/23/13 2102 07/25/13 1130  GLUCAP 123* 119* 114*   1-15 TSH 2.4 12-14 hemoglobin A1c 6.3 11-14 FLP normal  EXAM: CHEST - 1 VIEW   COMPARISON:  07/22/2013 and earlier.   FINDINGS: Portable AP semi upright view at 0855 hrs. Stable left PICC line. Stable cardiac size and mediastinal contours. No pneumothorax. Continued bilateral veiling pulmonary opacity compatible with pleural effusions, greater on the right and appearing moderate in volume. Associated dense bibasilar opacity is stable.   IMPRESSION: Continued right greater than left pleural effusions with bibasilar atelectasis or consolidation.   ASSESSMENT/PLAN:  CHF-compensated Atrial fibrillation-rate controlled Chronic kidney disease stage III-reassess Diabetes  mellitus nephropathy-continue current medications Hypothyroidism-well controlled Hyperlipidemia-well controlled Renovascular hypertension-well-controlled  Check CBC and BMP  I have reviewed patient's medical records received at admission/from hospitalization.  CPT CODE: 53664

## 2013-08-14 ENCOUNTER — Ambulatory Visit: Payer: Medicare Other | Admitting: Internal Medicine

## 2013-08-14 ENCOUNTER — Non-Acute Institutional Stay (SKILLED_NURSING_FACILITY): Payer: Medicare Other | Admitting: Internal Medicine

## 2013-08-14 DIAGNOSIS — E870 Hyperosmolality and hypernatremia: Secondary | ICD-10-CM

## 2013-08-14 DIAGNOSIS — N183 Chronic kidney disease, stage 3 unspecified: Secondary | ICD-10-CM

## 2013-08-14 DIAGNOSIS — D631 Anemia in chronic kidney disease: Secondary | ICD-10-CM

## 2013-08-14 DIAGNOSIS — N039 Chronic nephritic syndrome with unspecified morphologic changes: Secondary | ICD-10-CM

## 2013-08-16 NOTE — Progress Notes (Signed)
Patient ID: [name], female   DOB: 09/08/1931, 80 y.o.   MRN: [id number]          PROGRESS NOTE  DATE: 07/17/2013    FACILITY:  [facility]  LEVEL OF CARE: SNF (31)  Acute Visit  CHIEF COMPLAINT:  Manage acute renal failure.    HISTORY OF PRESENT ILLNESS: I was requested by the staff to assess the patient regarding above problem(s):  On 07/10/2013:  BUN 32, creatinine 1.65.  On 07/03/2013:  BUN 35, creatinine 1.59.  Patient is currently on Lasix 80 mg q.d.  Patient is a poor historian.  Staff do not report increasing confusion or lower extremity swelling.    PAST MEDICAL HISTORY : Reviewed.  No changes.  CURRENT MEDICATIONS: Reviewed per [facility]  REVIEW OF SYSTEMS:  Unobtainable due to patient being a poor historian.    PHYSICAL EXAMINATION  GENERAL: no acute distress, morbidly obese body habitus NECK: supple, trachea midline, no neck masses, no thyroid tenderness, no thyromegaly RESPIRATORY: breathing is even & unlabored, BS CTAB CARDIAC: RRR, no murmur,no extra heart sounds EDEMA/VARICOSITIES:  +2 bilateral lower extremity edema  ARTERIAL: lower extremities are dressed   GI: abdomen soft, normal BS, no masses, no tenderness, no hepatomegaly, no splenomegaly PSYCHIATRIC: the patient is alert, unable to assess orientation, decreased affect and mood    ASSESSMENT/PLAN:  Acute renal failure.  New onset problem.  Likely secondary to Lasix.  Decrease Lasix to 60 mg q.d.   Recheck in two days.    CPT CODE: 46270     Maizy Davanzo Y Eldonna Neuenfeldt, [place] [phone]

## 2013-08-18 ENCOUNTER — Non-Acute Institutional Stay (SKILLED_NURSING_FACILITY): Payer: Medicare Other | Admitting: Internal Medicine

## 2013-08-18 DIAGNOSIS — E870 Hyperosmolality and hypernatremia: Secondary | ICD-10-CM

## 2013-08-18 DIAGNOSIS — N182 Chronic kidney disease, stage 2 (mild): Secondary | ICD-10-CM

## 2013-08-18 DIAGNOSIS — I509 Heart failure, unspecified: Secondary | ICD-10-CM

## 2013-08-18 NOTE — Progress Notes (Signed)
Patient ID: [name], female   DOB: Dec 08, 1931, 80 y.o.   MRN: [id number] Facility: [facility] Chief Complaint: Review of Severe Hypernatremia History: This is a patient who appears to have been hypernatremic since at least 2/12 when her sodium was 155 BUN 51 cr. 1.49. She was on Lasix at 60 mg this has been discontinued as of 2/18. She was given a liter of D5W however her sodium as of yesterday was 160. I note that she was recently in hospital last month with a acute on chronic diastolic heart failure. Echocardiogram showed normal EF. She was aggressively diuresed and since her return to the skilled facility she has been on Lasix 60 mg. It would appear that her baseline creatinine is 1.6. I was called yesterday about this by our service. We started the patient on half-normal saline at 125 cc an hour. We placed a Foley catheter and it would appear that she has decent urine output. Her BUN was 54 and creatinine 1.54 a long with the sodium of 160.  In discussing things with the staff she apparently will eat and drink when fed. As mentioned she does have a history of chronic diastolic heart failure and chronic renal insufficiency. She was also on sodium bicarbonate for reasons that are not totally clear as I can't see that she was actually acidotic. This was also on hold as of yesterday. Her total CO2 yesterday was 35.  Also of note she's appears to have had 2 sets of blood cultures done one out of the 2 is growing gram-positive cocci in clusters.  Review of systems is otherwise not possible  Physical examination Gen. the patient is awake and conversational. She is not in any distress. Vital signs are stable. She is afebrile Respiratory; clear entry bilaterally Cardiac heart sounds are normal she actually does not appear to be that dehydrated, not as dehydrated as I might expect with a sodium of 160 although she is already probably had 2 L of half-normal saline Abdomen;Soft, Non tender. Abdomen is  mildly distended however there no masses no liver no spleen  Impressions 1) severe hypernatremia; her diuretics were only put on hold as of 2/18 the should continue. I did not do the mass but I would estimate probably about 5-6 L of free fluid deficit. I will next repeat her basic metabolic panel on 1/06. Her vital signs are stable, she is putting out urine, at this point I don't think hospitalizations or necessary. Suspect this is due to ongoing diuretic use and marginal fluid intake. I do not see another obvious reason #2 chronic diastolic heart failure with a recent admission to hospital in January of 2015. At some point she is going to need to go back on some diuretic however that is clearly not now #3 chronic renal insufficiency; we'll monitor this carefully  Hypernatremia should always be looked at as a serious issue in a nursing home and I discussed this with the staff. I will review her again on 2/22

## 2013-08-19 ENCOUNTER — Non-Acute Institutional Stay (SKILLED_NURSING_FACILITY): Payer: Medicare Other | Admitting: Internal Medicine

## 2013-08-19 DIAGNOSIS — E870 Hyperosmolality and hypernatremia: Secondary | ICD-10-CM

## 2013-08-19 NOTE — Progress Notes (Signed)
Patient ID: [name], female   DOB: 06/18/1932, 80 y.o.   MRN: [id number] Facility: [facility] Chief Complaint: Review of Severe Hypernatremia History: This is a patient who appears to have been hypernatremic since at least 2/12 when her sodium was 155 BUN 51 cr. 1.49. She was on Lasix at 60 mg this has been discontinued as of 2/18. She was given a liter of D5W however her sodium as of yesterday was 160. I note that she was recently in hospital last month with a acute on chronic diastolic heart failure. Echocardiogram showed normal EF. She was aggressively diuresed and since her return to the skilled facility she has been on Lasix 60 mg. It would appear that her baseline creatinine is 1.6. I was called yesterday about this by our service. We started the patient on half-normal saline at 125 cc an hour. We placed a Foley catheter and it would appear that she has decent urine output. Her BUN was 54 and creatinine 1.54 a long with the sodium of 160.  BMP was supposed to be done this morning however it was not obtained. The IV infiltrated yesterday and has not been restarted.  In discussing things with the staff she apparently will eat and drink when fed. As mentioned she does have a history of chronic diastolic heart failure and chronic renal insufficiency. She was also on sodium bicarbonate for reasons that are not totally clear as I can't see that she was actually acidotic. This was also on hold as of yesterday. Her total CO2 yesterday was 35.  Also of note she's appears to have had 2 sets of blood cultures done one out of the 2 is growing gram-positive cocci in clusters.  Review of systems is otherwise not possible  Physical examination Gen. the patient is awake and conversational.  Vitals; respirations 18 O2 sat 98% on 2 L pulse rate 97 Respiratory; clear entry bilaterally Cardiac heart sounds are normal. She does not appear to be dehydrated. Foley catheter is played placing a dilute  urine Abdomen;Soft, Non tender. Abdomen is mildly distended however there no masses no liver no spleen  Impressions 1) severe hypernatremia; her diuretics were only put on hold as of 2/18 the should continue. Her IV is infiltrated did not do the lab work this morning. I will repeat her basic metabolic panel tomorrow #2 chronic diastolic heart failure with a recent admission to hospital in January of 2015. At some point she is going to need to go back on some diuretic however that is clearly not now. No current evidence of heart failure #3 chronic renal insufficiency; we'll monitor this carefully  Hypernatremia should always be looked at as a serious issue in a nursing home and I discussed this with the staff. I will review her again on 2/24

## 2013-08-20 ENCOUNTER — Ambulatory Visit (HOSPITAL_COMMUNITY)
Admission: RE | Admit: 2013-08-20 | Discharge: 2013-08-20 | Disposition: A | Discharge status: Home / Self Care | Payer: Medicare Other | Source: Ambulatory Visit | Attending: Internal Medicine | Admitting: Internal Medicine

## 2013-08-20 VITALS — BP 123/71 | HR 85 | Resp 18

## 2013-08-20 DIAGNOSIS — I129 Hypertensive chronic kidney disease with stage 1 through stage 4 chronic kidney disease, or unspecified chronic kidney disease: Secondary | ICD-10-CM | POA: Insufficient documentation

## 2013-08-20 DIAGNOSIS — I482 Chronic atrial fibrillation, unspecified: Secondary | ICD-10-CM

## 2013-08-20 DIAGNOSIS — N183 Chronic kidney disease, stage 3 unspecified: Secondary | ICD-10-CM | POA: Insufficient documentation

## 2013-08-20 DIAGNOSIS — I50810 Right heart failure, unspecified: Secondary | ICD-10-CM

## 2013-08-20 DIAGNOSIS — I4891 Unspecified atrial fibrillation: Secondary | ICD-10-CM | POA: Insufficient documentation

## 2013-08-20 DIAGNOSIS — I10 Essential (primary) hypertension: Secondary | ICD-10-CM

## 2013-08-20 DIAGNOSIS — I509 Heart failure, unspecified: Secondary | ICD-10-CM

## 2013-08-20 DIAGNOSIS — I5032 Chronic diastolic (congestive) heart failure: Secondary | ICD-10-CM | POA: Insufficient documentation

## 2013-08-20 NOTE — Progress Notes (Signed)
Patient ID: [name], female   DOB: 1931-09-23, 80 y.o.   MRN: [id number] PCP: Dr. Durwin Reges Primary Cardiologist: Dr. Percival Spanish  80 yo with history of chronic atrial fibrillation, CKD, and chronic diastolic CHF with component of RV failure presents for cardiology followup.  Patient is essentially wheelchair-bound and lives in an nursing home.  I suspect there is underlying dementia and history is difficult (was difficult in the hospital as well).  She was admitted in 06/2013 with acute/chronic diastolic CHF with RV failure and anasarca.  EF was 60-65% by echo with PA systolic pressure 65 mmHg.  She was diuresed with Lasix gtt and metolazone, total net 37 L negative with > 50 lbs weight loss. She is now back at the nursing home.   Follow up: Last visit increased diltiazem CD to 360 mg daily, however appears on chart still taking 240 mg daily.  Feeling "Ok."  Reading over notes from facility her last Na 155 and her diuretics were placed on hold, however her papers that were sent with her say she is taking lasix 80 mg q am and 40 mg qpm. HR more controlled. Denies SOB, orthopnea or CP. Today, her legs are wrapped and remain quite improved in terms of swelling. No family at visit.  Labs (2/15): K 3.7, creatinine 1.4   PMH: 1. Chronic atrial fibrillation.  She is off anticoagulation due to rectus sheath hematoma in 11/14.  2. Type II diabetes 3. HTN 4. CKD 5. Chronic diastolic CHF with component of RV failure.  She is on chronic 2 L O2 by nasal cannula.  Echo (11/14) with EF 60-65%, mild LVH, moderate TR, PA systolic pressure 65 mmHg.   6. Hyperlipidemia. 7. Suspect dementia  SH: [place] in [place].  No smoking.    FH: No premature CAD.    ROS: All systems reviewed and negative except as per HPI.   Current Outpatient Prescriptions  Medication Sig Dispense Refill  . acetaminophen (TYLENOL) 325 MG tablet Take 2 tablets (650 mg total) by mouth every 4 (four) hours as needed for headache  or mild pain.      Marland Kitchen ALPRAZolam (XANAX) 0.25 MG tablet Take 0.25 mg by mouth 2 (two) times daily as needed for anxiety.      Marland Kitchen atorvastatin (LIPITOR) 20 MG tablet Take 1 tablet (20 mg total) by mouth daily at 6 PM.  30 tablet  0  . bisacodyl (DULCOLAX) 10 MG suppository Place 10 mg rectally daily as needed for moderate constipation.      . ciprofloxacin (CIPRO) 250 MG tablet Take 250 mg by mouth 2 (two) times daily.      . citalopram (CELEXA) 20 MG tablet Take 20 mg by mouth daily.      Marland Kitchen diltiazem (CARDIZEM LA) 240 MG 24 hr tablet Take 1 tablet (240 mg total) by mouth daily.      Marland Kitchen docusate sodium (COLACE) 100 MG capsule Take 100 mg by mouth 2 (two) times daily as needed for mild constipation.      . ferrous gluconate (FERGON) 325 MG tablet Take 325 mg by mouth daily with breakfast.      . furosemide (LASIX) 40 MG tablet Take 40 mg by mouth daily. Take 80mg  (2 tablets) am, 40mg  (1 tablet) pm      . HYDROcodone-acetaminophen (NORCO) 7.5-325 MG per tablet Take one tablet by mouth four times daily for pain  120 tablet  0  . labetalol (NORMODYNE) 300 MG tablet Take 150 mg  by mouth 2 (two) times daily.      Marland Kitchen levothyroxine (SYNTHROID, LEVOTHROID) 25 MCG tablet Take 25 mcg by mouth daily before breakfast.      . Multiple Vitamins-Minerals (MULTIVITAMIN WITH MINERALS) tablet Take 1 tablet by mouth daily.      . ondansetron (ZOFRAN) 4 MG tablet Take 4 mg by mouth every 8 (eight) hours as needed for nausea or vomiting.      Marland Kitchen oxyCODONE (OXY IR/ROXICODONE) 5 MG immediate release tablet Take 1 tablet (5 mg total) by mouth every 4 (four) hours as needed for severe pain (prior to dressing change).  180 tablet  0  . potassium chloride SA (K-DUR,KLOR-CON) 20 MEQ tablet Take 1 tablet (20 mEq total) by mouth 2 (two) times daily.  60 tablet    . zinc sulfate 220 MG capsule Take 220 mg by mouth daily.      . Cholecalciferol (VITAMIN D) 1000 UNITS capsule Take 1,000 Units by mouth daily.       . insulin lispro  (HUMALOG) 100 UNIT/ML injection Inject 0-8 Units into the skin 4 (four) times daily -  before meals and at bedtime. 201-250=2units, 251-300=4 units, 301=35-=6units, 351-400=8units       No current facility-administered medications for this encounter.    Filed Vitals:   08/20/13 1159  BP: 123/71  Pulse: 85  Resp: 18  SpO2: 98%   General: NAD Neck: JVP 8 cm, no thyromegaly or thyroid nodule.  Lungs: Clear to auscultation bilaterally with normal respiratory effort. CV: Nondisplaced PMI.  Heart tachy, irregular S1/S2, no S3/S4, no murmur.  Lower legs wrapped with minimal edema.  No carotid bruit.  Normal pedal pulses.  Abdomen: Soft, nontender, no hepatosplenomegaly, no distention.  Skin: Intact without lesions or rashes.  Neurologic: Alert and oriented x 3.  Psych: Normal affect. Extremities: No clubbing or cyanosis.   Assessment/Plan:  1. Chronic atrial fibrillation: HR is better controlled today. They did not increase diltiazem CD to 360 mg daily from last visit. Will continue 240 mg daily and if HR becomes an issue can increase. - She is not on anticoagulation due to fall risk and rectus sheath hematoma in 11/14.  EF is normal. - Continue current labetalol  2. Chronic diastolic CHF with component of RV failure. She probably has some mild residual volume overload but lost copious weight in the hospital during the last admission. They are not weighing daily but weekly at facility. Reports after calling facility her weights are 174-211 lbs. Discharge weight 190. Continue to wrap legs daily.  - Cr stable.  - Continue current Lasix and KCl. Conflicting information in the notes says diuretics on hold d/t high Na, but chart sent with her says she is still taking. Will confirm. Would like to continue lasix 80 mg q am and 40 mg q pm. - Stop sodium bicarb with high Na. - BMET/BNP 1 week fax to our clinic - Reinforced the need and importance of daily weights, a low sodium diet, and fluid  restriction (less than 2 L a day). Instructed to call the HF clinic if weight increases more than 3 lbs overnight or 5 lbs in a week. Facility should have patient on low sodium diet. 3. CKD: As above, will check BMET 1 week.  4. Suspect dementia: Makes history difficult. No family present.   Follow up Dr. Percival Spanish in 4-6 weeks.   Junie Bame B NP-C 08/20/2013

## 2013-08-21 DIAGNOSIS — E870 Hyperosmolality and hypernatremia: Secondary | ICD-10-CM | POA: Insufficient documentation

## 2013-08-21 NOTE — Progress Notes (Signed)
Patient ID: [name], female   DOB: 01/23/1932, 80 [name].o.   MRN: [id number]          PROGRESS NOTE  DATE: 08/14/2013    FACILITY:  [facility]  LEVEL OF CARE: SNF (31)  Acute Visit  CHIEF COMPLAINT:  Manage hypernatremia, chronic kidney disease stage III, and anemia of chronic kidney disease.    HISTORY OF PRESENT ILLNESS: I was requested by the staff to assess the patient regarding above problem(s):  HYPERNATREMIA:  New problem.  On 08/08/2013:  Sodium was 155.  The patient was started on half-normal saline yesterday.   Patient overall is a poor historian.    CHRONIC KIDNEY DISEASE: The patient's chronic kidney disease remains stable.  Patient denies increasing lower extremity swelling or confusion. Last BUN and creatinine are:   On 08/08/2013:  BUN 51, creatinine 1.49.  In 06/2013:  BUN 29, creatinine 1.47.  She has a history of chronic kidney disease stage III.    ANEMIA: The anemia has been stable. The patient denies fatigue, melena or hematochezia. No complications from the medications currently being used.   On 08/08/2013:  Hemoglobin was 10.3, MCV 97.   In 05/2013:  Hemoglobin 10.    PAST MEDICAL HISTORY : Reviewed.  No changes.  CURRENT MEDICATIONS: Reviewed per [facility]  REVIEW OF SYSTEMS:  Unobtainable.  Patient is a poor historian.      PHYSICAL EXAMINATION  GENERAL: no acute distress, moderately obese body habitus EYES: conjunctivae normal, sclerae normal, normal eye lids NECK: supple, trachea midline, no neck masses, no thyroid tenderness, no thyromegaly LYMPHATICS: no LAN in the neck, no supraclavicular LAN RESPIRATORY: breathing is even & unlabored, BS CTAB CARDIAC: RRR, no murmur,no extra heart sounds EDEMA/VARICOSITIES: bilateral lower extremities edematous and wrapped    GI: abdomen soft, normal BS, no masses, no tenderness, no hepatomegaly, no splenomegaly PSYCHIATRIC: the patient is minimally alert, unable to assess orientation, decreased affect,  mood appropriate    ASSESSMENT/PLAN:  Hypernatremia.  New onset.  Significant problem.  Agree with IV fluids.  Recheck after IV fluids completed.    Chronic kidney disease stage III.  Renal functions stable.  Follow up after IV fluids.    Anemia of chronic kidney disease.   Hemoglobin improved.   Continue iron.    CPT CODE: 00938     [name] [name], [place] [phone]

## 2013-08-22 ENCOUNTER — Non-Acute Institutional Stay (SKILLED_NURSING_FACILITY): Payer: Medicare Other | Admitting: Internal Medicine

## 2013-08-22 DIAGNOSIS — I509 Heart failure, unspecified: Secondary | ICD-10-CM

## 2013-08-22 DIAGNOSIS — E870 Hyperosmolality and hypernatremia: Secondary | ICD-10-CM

## 2013-08-22 DIAGNOSIS — B952 Enterococcus as the cause of diseases classified elsewhere: Secondary | ICD-10-CM

## 2013-08-22 DIAGNOSIS — I482 Chronic atrial fibrillation, unspecified: Secondary | ICD-10-CM

## 2013-08-22 DIAGNOSIS — N390 Urinary tract infection, site not specified: Secondary | ICD-10-CM

## 2013-08-22 DIAGNOSIS — I4891 Unspecified atrial fibrillation: Secondary | ICD-10-CM

## 2013-08-23 NOTE — Progress Notes (Signed)
Patient ID: [name], female   DOB: 11/30/1931, 80 y.o.   MRN: [id number] Facility; [facility] Chief complaint; followup severe hypernatremia with a sodium up to 160. History; I been following this patient for her severe hypernatremia with a sodium up to 160. She has completed IV fluid replacement with one half normal saline and I have stopped her sodium bicarbonate and put her Lasix on hold. Along with a sodium bicarbonate she seems to of had escalating doses of Lasix since last month the. In January she appears to be on 20 mg a day. The doses escalated to 80 mg a day. I have put all her diuretics on hold while we are replacing her free fluid and correcting her resultant delirium. She is also apparently on a 2000 cc fluid restriction although I am doubtful that this is followed with any regularity.  With regards to other issues surrounding I believe her altered mental status. She had 2 blood cultures done one out of 2 showed coag negative staph. I don't believe this represents a true systemic infection. She also had a urine culture done that showed penicillin sensitive enterococcus. It is more difficult to say that she is/was symptomatic from this.   Yesterday her sodium was down to 147, he went of 44 creatinine of 1.27. Her potassium was 5.8 although this is a hemolyzed specimen. At the worst of this her BUN was 54, creatinine 1.44, sodium of 160 with a CO2 of 35.   Review of systems Respiratory; the patient is not complaining of shortness of breath Cardiac; no chest pain no shortness of breath GI; no abdominal pain, no diarrhea. Per the speech therapist who is at the bedside her oral intake is picked up traumatically this week presumably as her mental status has improved.  Physical examination Gen. the patient is considerably more alert talking to me in full sentences.   O2 sats 96% on 2l ,  RR 20 [id number] Resp; bronchial RLL, Left lung is clear. No wheezing Cardiac. JVP is elevated. HS  irregular. No  Miurmers.  Extremites: minimal edema. No DVT     ------------------------------------------------------------ Transthoracic Echocardiography  Patient:    [name], [name] MR #:       [id number] Study Date: 05/11/2013 Gender:     F Age:        42 Height:     167.6cm Weight:     83.5kg BSA:        1.5m^2 Pt. Status: Room:       [place]    [place], [place]  ADMITTING    Tat, Joan Mayans     Tat, Albina Billet  SONOGRAPHER  Jimmy Reel cc:  ------------------------------------------------------------ LV EF: 60% -   65%  ------------------------------------------------------------ Indications:      Atrial fibrillation - chronic 427.31.  CHF - 428.0.  DM - IDDM - controlled 250.01.  Hypertension - benign 401.1.  Obesity 278.00.  ------------------------------------------------------------ Study Conclusions  - Left ventricle: The cavity size was normal. Wall thickness   was increased in a pattern of mild LVH. Systolic function   was normal. The estimated ejection fraction was in the   range of 60% to 65%. Wall motion was normal; there were no   regional wall motion abnormalities. Indeterminant   diastolic function (atrial fibrillation). - Aortic valve: There was no stenosis. - Mitral valve: Mildly calcified annulus. Normal thickness  leaflets . Mild regurgitation. - Left atrium: The atrium was moderately dilated. - Right ventricle: The cavity size was mildly dilated.   Systolic function was normal. - Right atrium: The atrium was moderately dilated. - Tricuspid valve: Moderate regurgitation. Peak RV-RA   gradient: 6mm Hg (S). - Pulmonary arteries: PA peak pressure: 25mm Hg (S). - Systemic veins: IVC measured 2.6 cm with some   respirophasic variation, suggesting RA pressure 15 mmHg. Impressions:  - The patient was in atrial fibrillation. Normal LV size and   systolic function, EF 78-29%. Mild LV  hypertrophy. Mildly   dilated LV with normal systolic function. Moderate   biatrial enlargement. Moderate TR. Moderate pulmonary   hypertension. Transthoracic echocardiography.  M-mode, complete 2D, spectral Doppler, and color Doppler.  Height:  Height: 167.6cm. Height: 66in.  Weight:  Weight: 83.5kg. Weight: 183.6lb.  Body mass index:  BMI: 29.7kg/m^2.  Body surface area:    BSA: 1.56m^2.  Blood pressure:     130/90.  Patient status:  Inpatient.  Location:  Bedside.  ------------------------------------------------------------  ------------------------------------------------------------ Left ventricle:  The cavity size was normal. Wall thickness was increased in a pattern of mild LVH. Systolic function was normal. The estimated ejection fraction was in the range of 60% to 65%. Wall motion was normal; there were no regional wall motion abnormalities. Indeterminant diastolic function (atrial fibrillation).  ------------------------------------------------------------ Aortic valve:   Mildly calcified leaflets.  Doppler:   There was no stenosis.    No regurgitation.  ------------------------------------------------------------ Aorta:  Aortic root: The aortic root was normal in size. Ascending aorta: The ascending aorta was normal in size.  ------------------------------------------------------------ Mitral valve:   Mildly calcified annulus. Normal thickness leaflets .  Doppler:   There was no evidence for stenosis.  Mild regurgitation.    Peak gradient: 15mm Hg (D).  ------------------------------------------------------------ Left atrium:  The atrium was moderately dilated.  ------------------------------------------------------------ Right ventricle:  The cavity size was mildly dilated. Systolic function was normal.  ------------------------------------------------------------ Pulmonic valve:    Structurally normal valve.   Cusp separation was normal.  Doppler:   Transvalvular velocity was within the normal range.  No regurgitation.  ------------------------------------------------------------ Tricuspid valve:   Doppler:   Moderate regurgitation.  ------------------------------------------------------------ Right atrium:  The atrium was moderately dilated.  ------------------------------------------------------------ Pericardium:  There was no pericardial effusion.  ------------------------------------------------------------ Systemic veins:  IVC measured 2.6 cm with some respirophasic variation, suggesting RA pressure 15 mmHg.  Impressions: 1) Hypernatremia; Likely due to excess lasix,NaHco3,Perhaps coexistent illness with an Enterococcus UTI. The Sodium has been corrected. She is brighter and more allert.  2) history of congestive heart failure mostly diastolic, although her last echocardiogram in November suggests pulmonary hypertension, tricuspid regurgitation and perhaps she has more of a component of right-sided heart failure. I have resumed her Lasix at 40 mg a day and will review her weights and basic metabolic panel early next week. I have increased her weights to MWF. 3) urinary tract infection with her urine culture showing greater than 100,000 enterococcus. This may have contributed to her recent difficulties therefore I have started her on amoxicillin for a one-week course 4) one out of 2 blood cultures showing coag-negative staph. I think this is a contaminant and will not direct further antibiotics towards this 5) atrial fibrillation; her rate is controlled. Not an anticoagulation candidate apparently due to a rectus sheath hematoma. #6 only recently become involved with this lady's care surrounding her most recent illness. She is a complex patient and I'm not sure I really have to  handle on all of her medical or functional issues.

## 2013-09-05 LAB — BASIC METABOLIC PANEL
BUN: 42 mg/dL — AB (ref 4–21)
Creatinine: 1.3 mg/dL — AB (ref 0.5–1.1)
GLUCOSE: 81 mg/dL
POTASSIUM: 4.8 mmol/L (ref 3.4–5.3)
SODIUM: 140 mmol/L (ref 137–147)

## 2013-09-10 ENCOUNTER — Other Ambulatory Visit: Payer: Self-pay | Admitting: *Deleted

## 2013-09-10 MED ORDER — HYDROCODONE-ACETAMINOPHEN 7.5-325 MG PO TABS: ORAL_TABLET | ORAL | Status: DC

## 2013-09-10 NOTE — Telephone Encounter (Signed)
[facility] 

## 2013-09-11 LAB — BASIC METABOLIC PANEL
BUN: 10 mg/dL (ref 4–21)
Creatinine: 1.2 mg/dL — AB (ref 0.5–1.1)
Glucose: 118 mg/dL
Potassium: 5.5 mmol/L — AB (ref 3.4–5.3)
Sodium: 139 mmol/L (ref 137–147)

## 2013-09-22 LAB — BASIC METABOLIC PANEL
BUN: 46 mg/dL — AB (ref 4–21)
Creatinine: 1.3 mg/dL — AB (ref 0.5–1.1)
Glucose: 109 mg/dL
Potassium: 6.7 mmol/L — AB (ref 3.4–5.3)
Sodium: 138 mmol/L (ref 137–147)

## 2013-09-24 LAB — BASIC METABOLIC PANEL
BUN: 47 mg/dL — AB (ref 4–21)
Creatinine: 1.4 mg/dL — AB (ref 0.5–1.1)
GLUCOSE: 150 mg/dL
POTASSIUM: 5.5 mmol/L — AB (ref 3.4–5.3)
SODIUM: 149 mmol/L — AB (ref 137–147)

## 2013-09-25 ENCOUNTER — Non-Acute Institutional Stay (SKILLED_NURSING_FACILITY): Payer: Medicare Other | Admitting: Adult Health

## 2013-09-25 DIAGNOSIS — I10 Essential (primary) hypertension: Secondary | ICD-10-CM

## 2013-09-25 DIAGNOSIS — K5900 Constipation, unspecified: Secondary | ICD-10-CM

## 2013-09-25 DIAGNOSIS — G894 Chronic pain syndrome: Secondary | ICD-10-CM

## 2013-09-25 DIAGNOSIS — E875 Hyperkalemia: Secondary | ICD-10-CM

## 2013-09-25 DIAGNOSIS — E1129 Type 2 diabetes mellitus with other diabetic kidney complication: Secondary | ICD-10-CM

## 2013-09-25 DIAGNOSIS — E039 Hypothyroidism, unspecified: Secondary | ICD-10-CM

## 2013-09-25 DIAGNOSIS — D631 Anemia in chronic kidney disease: Secondary | ICD-10-CM

## 2013-09-25 DIAGNOSIS — I5032 Chronic diastolic (congestive) heart failure: Secondary | ICD-10-CM

## 2013-09-25 DIAGNOSIS — I509 Heart failure, unspecified: Secondary | ICD-10-CM

## 2013-09-25 DIAGNOSIS — N039 Chronic nephritic syndrome with unspecified morphologic changes: Secondary | ICD-10-CM

## 2013-09-25 DIAGNOSIS — E785 Hyperlipidemia, unspecified: Secondary | ICD-10-CM

## 2013-09-25 DIAGNOSIS — F411 Generalized anxiety disorder: Secondary | ICD-10-CM

## 2013-09-28 ENCOUNTER — Encounter: Payer: Self-pay | Admitting: Adult Health

## 2013-09-28 DIAGNOSIS — E875 Hyperkalemia: Secondary | ICD-10-CM | POA: Insufficient documentation

## 2013-09-28 NOTE — Progress Notes (Signed)
Patient ID: [name], female   DOB: Jul 25, 1931, 80 y.o.   MRN: [id number]     [place]  Allergies  Allergen Reactions  . Clonidine Derivatives Itching and Rash    Skin breaks out     Chief Complaint  Patient presents with  . Medical Managment of Chronic Issues    HPI:  She is being seen for the management of her chronic illnesses. Overall there is no significant change in her recent status. She tells me that she is "ok" but cannot fully participate in the hpi or ros. There are no nursing being voiced at this time.    Past Medical History  Diagnosis Date  . Obesity   . Anemia, iron deficiency   . Depression   . Diabetes mellitus type II   . Hypertension   . Peptic ulcer   . History of uterine fibroid   . Hx of colonic polyps   . Diverticula, colon   . Osteoarthritis   . Low back pain   . Pulmonary hypertension     a. echo 4/12:  EF 55-60%, mod LVH, mild BAE, RVE and PASP 51;  b.  Echo (04/2013):  Mild LVH, EF 60-65%, mild MAC, mild MR, mod LAE, mild RVE, normal RVF, mod RAE, mod TR, PASP 65  . Anxiety   . HYPERLIPIDEMIA 03/05/2007  . ANEMIA-IRON DEFICIENCY 03/05/2007  . Right-sided heart failure 03/05/2007  . LOW BACK PAIN 03/05/2007  . Chronic pain syndrome 06/23/2010  . Hypothyroidism 10/15/2010  . CKD (chronic kidney disease) stage 3, GFR 30-59 ml/min 10/15/2010  . Anemia of chronic disease 05/11/2013  . Atrial fibrillation     permanent, on coumadin;  coumadin stopped at time of spontaneous rectus sheat hematoma 04/2013  . Anasarca   . CHF (congestive heart failure)     Past Surgical History  Procedure Laterality Date  . Laparoscopic hysterectomy    . Colonoscopy  2009    VITAL SIGNS BP 136/88  Pulse 64  Ht 5\' 5"  (1.651 m)  Wt 174 lb (78.926 kg)  BMI 28.96 kg/m2   Patient's Medications  New Prescriptions   No medications on file  Previous Medications   ACETAMINOPHEN (TYLENOL) 325 MG TABLET    Take 2 tablets (650 mg total) by mouth every 4 (four)  hours as needed for headache or mild pain.   ALPRAZOLAM (XANAX) 0.25 MG TABLET    Take 0.25 mg by mouth 2 (two) times daily as needed for anxiety.   ATORVASTATIN (LIPITOR) 20 MG TABLET    Take 1 tablet (20 mg total) by mouth daily at 6 PM.   BISACODYL (DULCOLAX) 10 MG SUPPOSITORY    Place 10 mg rectally daily as needed for moderate constipation.   CHOLECALCIFEROL (VITAMIN D) 1000 UNITS CAPSULE    Take 1,000 Units by mouth daily.    CITALOPRAM (CELEXA) 20 MG TABLET    Take 20 mg by mouth daily.   DILTIAZEM (CARDIZEM LA) 240 MG 24 HR TABLET    Take 1 tablet (240 mg total) by mouth daily.   DOCUSATE SODIUM (COLACE) 100 MG CAPSULE    Take 100 mg by mouth 2 (two) times daily as needed for mild constipation.   FERROUS GLUCONATE (FERGON) 325 MG TABLET    Take 325 mg by mouth daily with breakfast.   FUROSEMIDE (LASIX) 40 MG TABLET    Take 40 mg by mouth daily. One 40 mg tab daily   HYDROCODONE-ACETAMINOPHEN (NORCO) 7.5-325 MG PER TABLET  Take one tablet by mouth four times daily for pain   INSULIN LISPRO (HUMALOG) 100 UNIT/ML INJECTION    Inject 0-8 Units into the skin 4 (four) times daily -  before meals and at bedtime. 201-250=2units, 251-300=4 units, 301=35-=6units, 351-400=8units   LABETALOL (NORMODYNE) 300 MG TABLET    Take 150 mg by mouth 2 (two) times daily.   LEVOTHYROXINE (SYNTHROID, LEVOTHROID) 25 MCG TABLET    Take 25 mcg by mouth daily before breakfast.   MULTIPLE VITAMINS-MINERALS (MULTIVITAMIN WITH MINERALS) TABLET    Take 1 tablet by mouth daily.   ONDANSETRON (ZOFRAN) 4 MG TABLET    Take 4 mg by mouth every 8 (eight) hours as needed for nausea or vomiting.   OXYCODONE (OXY IR/ROXICODONE) 5 MG IMMEDIATE RELEASE TABLET    Take 1 tablet (5 mg total) by mouth every 4 (four) hours as needed for severe pain (prior to dressing change).   POTASSIUM CHLORIDE SA (K-DUR,KLOR-CON) 20 MEQ TABLET    Take 1 tablet (20 mEq total) by mouth 2 (two) times daily.   ZINC SULFATE 220 MG CAPSULE    Take 220 mg  by mouth daily.  Modified Medications   No medications on file  Discontinued Medications   CIPROFLOXACIN (CIPRO) 250 MG TABLET    Take 250 mg by mouth 2 (two) times daily.    SIGNIFICANT DIAGNOSTIC EXAMS      LABS REVIEWED:   06-13-13: hgb a1c 6.3 07-04-13: tsh 2.400 09-05-13: glucose 81; bun 42; creat 1.27; k+4.8; na++ 140  09-11-13: glucose 118; bun 10; creat 1.21; k+5.5; na++139 09-22-13: glucose 109; bun 46; creat 1.34; k+6.7; na++138 09-24-13: glucose 150; bun 47; creat 1.45; k+5.5; na++140      Review of Systems  Unable to perform ROS    Physical Exam  Constitutional: She appears well-developed and well-nourished. No distress.  Neck: Neck supple. No JVD present.  Cardiovascular: Normal rate, regular rhythm and intact distal pulses.   Respiratory: Effort normal and breath sounds normal. No respiratory distress. She has no wheezes.  GI: Soft. Bowel sounds are normal. She exhibits no distension. There is no tenderness.  Musculoskeletal: She exhibits no edema.  Skin: Skin is warm and dry. She is not diaphoretic.      ASSESSMENT/ PLAN:  1. Hypertension: is stable will continue diltiazem cd 120 mg daily; labetalol 150 mg twice daily and will monitor  2. Heart failure: is presently stable; will continue lasix daily  3. Hyperkalemia: she is presently on kdur 20 meq twice daily will stop this medication at this time and will check bmp in one week.   4. Diabetes: will continue her humalog SSI ac and hs and will continue to monitor her status   5. Dyslipidemia: will continue lipitor 20 mg daily  6. Hypothyroidism: will continue synthroid 25 mcg daily   7. Anemia: will continue iron daily  8. Anxiety: is emotionally stable will continue celexa 20 mg daily; and will continue xanax 0.25 mg twice daily as needed   9. Chronic pain syndrome: no signs of pain present. Will continue vicodin 7.5/325 mg four times daily; oxycodone 5 mg every 4 hours as needed; and will monitor     10.  Constipation: will continue colace as needed and dulcolax supp as needed     Will check hgb a1c        Ok Edwards NP [facility]  Contact [phone] Monday through Friday 8am- 5pm  After hours call [phone]

## 2013-10-05 ENCOUNTER — Non-Acute Institutional Stay (SKILLED_NURSING_FACILITY): Payer: Medicare Other | Admitting: Internal Medicine

## 2013-10-05 DIAGNOSIS — I4891 Unspecified atrial fibrillation: Secondary | ICD-10-CM

## 2013-10-05 DIAGNOSIS — I5032 Chronic diastolic (congestive) heart failure: Secondary | ICD-10-CM

## 2013-10-05 DIAGNOSIS — I509 Heart failure, unspecified: Secondary | ICD-10-CM

## 2013-10-05 DIAGNOSIS — I482 Chronic atrial fibrillation, unspecified: Secondary | ICD-10-CM

## 2013-10-05 NOTE — Progress Notes (Signed)
Patient ID: [name], female   DOB: Jul 20, 1931, 80 y.o.   MRN: [id number] Facility; [facility] Chief complaint; review of declining function requested by the patient's daughter History; that this is a patient I do not know that well and was been in the facility since October 2014. She was admitted and cared for by my partner. I previously seen her for severe venous stasis ulcerations then in February saw her for significant hypernatremia with a serum sodium up to 160. We put her Lasix and her sodium bicarbonate on her gave her hypotonic fluid and I believe I started her Lasix back at 40 mg last lab work from 3/17 showed a sodium of 139 a potassium of 5.5 the BUN of 40 and a creatinine of 1.27 she has some degree of chronic renal insufficiency, I think about the values are baseline.  Family her daughter came in today to speak to the nurses including the director of nursing. She had concerns about why the patient wasn't able to ambulate, why she has wraps on her lower legs, I think there is a general concern that she is coming considerably less functional. I have reviewed her chart with regards to this and other medical issues  The patient appears to have come here after an index hospitalization in October 2014. At that point she was seen by neurology and diagnosed with bilateral basal ganglia strokes. There are references to the fact that she is "bedbound" . She also had right arm weakness at that time although this appears to have worsened vs. the description of the neurologist. Speaking to the nurses in the facility it appears that the right arm edema and increased tone has been noted since her arrival here.  This may have worsened.  The patient eats 25% of her meals according to the nurse on the floor today.   brain and surrounding structures were obtained without intravenous contrast. Angiographic images of the head were obtained using MRA technique without contrast.   COMPARISON:  CT head  without contrast 04/05/2013.   FINDINGS: MRI HEAD FINDINGS   The diffusion-weighted images demonstrate no evidence for acute or subacute infarction. Moderate generalized atrophy and diffuse white matter disease is present. Remote lacunar infarcts are evident within the basilar ganglia bilaterally.   Flow is present in the major intracranial arteries. 3 or 4 areas of punctate susceptibility are noted, compatible with remote punctate hemorrhage. This raises the possibility of vasculitis.   No acute hemorrhage or mass lesion is present. The globes and orbits are intact. Mild mucosal thickening is present in the ethmoid air cells. The remaining paranasal sinuses and the mastoid air cells are clear. Degenerative grade 1 retrolisthesis is present at C2-3 and C3-4. Chronic endplate changes are present at C2-3.   MRA HEAD FINDINGS   The study is markedly degraded by patient motion. This sequence is particularly sensitive to motion. This significantly degrades the study. Asymmetric signal loss is present in the distal cavernous and supra clinoid left internal carotid artery. This may represent significant stenosis. The A1 and M1 segments appear to be intact. There is significant attenuation of branch vessels.   The left vertebral artery is slightly dominant to the right. There is some narrowing of the distal right vertebral artery. The basilar artery is intact. The right posterior cerebral artery appears to be a fetal type. There is likely a small right P1 segment. There is significant signal loss in the distal branch vessels.   IMPRESSION: MRI HEAD IMPRESSION  1. No acute intracranial abnormality. 2. Atrophy and diffuse white matter disease. This likely reflects the sequelae of chronic microvascular ischemia. 3. Punctate areas of remote hemorrhage are concerning for amyloid angiography or other chronic vasculitis. 4. Marked degenerative changes within the upper cervical spine.    MRA HEAD IMPRESSION   1. The study is significantly degraded by patient motion, degrading evaluation of media min small vessels. 2. Probable narrowing of the distal cavernous and supra clinoid left internal carotid artery. 3. Fetal type right posterior cerebral artery.     Electronically Signed   By: Lawrence Santiago M.D.   On: 04/24/2013 08:35                  *[employer].                     [place], [place] [address]                         [phone]   ------------------------------------------------------------ Transthoracic Echocardiography  Patient:    [name], [name] MR #:       [id number] Study Date: 05/11/2013 Gender:     F Age:        69 Height:     167.6cm Weight:     83.5kg BSA:        1.42m^2 Pt. Status: Room:       [place], [place]  ADMITTING    Tat, Joan Mayans     Tat, Albina Billet  SONOGRAPHER  Jimmy Reel cc:  ------------------------------------------------------------ LV EF: 60% -   65%  ------------------------------------------------------------ Indications:      Atrial fibrillation - chronic 427.31.  CHF - 428.0.  DM - IDDM - controlled 250.01.  Hypertension - benign 401.1.  Obesity 278.00.  ------------------------------------------------------------ Study Conclusions  - Left ventricle: The cavity size was normal. Wall thickness   was increased in a pattern of mild LVH. Systolic function   was normal. The estimated ejection fraction was in the   range of 60% to 65%. Wall motion was normal; there were no   regional wall motion abnormalities. Indeterminant   diastolic function (atrial fibrillation). - Aortic valve: There was no stenosis. - Mitral valve: Mildly calcified annulus. Normal thickness   leaflets . Mild regurgitation. - Left atrium: The atrium was moderately dilated. - Right ventricle: The cavity size  was mildly dilated.   Systolic function was normal. - Right atrium: The atrium was moderately dilated. - Tricuspid valve: Moderate regurgitation. Peak RV-RA   gradient: 50mm Hg (S). - Pulmonary arteries: PA peak pressure: 45mm Hg (S). - Systemic veins: IVC measured 2.6 cm with some   respirophasic variation, suggesting RA pressure 15 mmHg. Impressions:  - The patient was in atrial fibrillation. Normal LV size and   systolic function, EF 78-93%. Mild LV hypertrophy. Mildly   dilated LV with normal systolic function. Moderate   biatrial enlargement. Moderate TR. Moderate pulmonary   hypertension. Transthoracic echocardiography.  M-mode, complete 2D, spectral Doppler, and color Doppler.  Height:  Height: 167.6cm. Height: 66in.  Weight:  Weight: 83.5kg. Weight: 183.6lb.  Body mass index:  BMI: 29.7kg/m^2.  Body surface area:    BSA: 1.13m^2.  Blood pressure:     130/90.  Patient status:  Inpatient.  Location:  Bedside.  ------------------------------------------------------------  ------------------------------------------------------------ Left ventricle:  The cavity size was normal. Wall thickness was increased in a pattern of mild LVH. Systolic function was normal. The estimated ejection fraction was in the range of 60% to 65%. Wall motion was normal; there were no regional wall motion abnormalities. Indeterminant diastolic function (atrial fibrillation).  ------------------------------------------------------------ Aortic valve:   Mildly calcified leaflets.  Doppler:   There was no stenosis.    No regurgitation.  ------------------------------------------------------------ Aorta:  Aortic root: The aortic root was normal in size. Ascending aorta: The ascending aorta was normal in size.  ------------------------------------------------------------ Mitral valve:   Mildly calcified annulus. Normal thickness leaflets .  Doppler:   There was no evidence for stenosis.  Mild  regurgitation.    Peak gradient: 19mm Hg (D).  ------------------------------------------------------------ Left atrium:  The atrium was moderately dilated.  ------------------------------------------------------------ Right ventricle:  The cavity size was mildly dilated. Systolic function was normal.  ------------------------------------------------------------ Pulmonic valve:    Structurally normal valve.   Cusp separation was normal.  Doppler:  Transvalvular velocity was within the normal range.  No regurgitation.  ------------------------------------------------------------ Tricuspid valve:   Doppler:   Moderate regurgitation.  ------------------------------------------------------------ Right atrium:  The atrium was moderately dilated.  ------------------------------------------------------------ Pericardium:  There was no pericardial effusion.  ------------------------------------------------------------ Systemic veins:  IVC measured 2.6 cm with some respirophasic variation, suggesting RA pressure 15 mmHg.    Current medications Lipitor 20 daily Celexa 20 daily Diltiazem 240 daily Ferrous gluconate 325 daily Vitamin D 8000 units daily  Synthroid 25 mcg daily  K-Dur 20 milliequivalents twice a day  labetalol 300 mg one half tablet 150 mg twice a day  Lasix 40 daily  Review of systems Respiratory she has oxygen on but is not complaining of cough or shortness of breath Cardiac no clear chest pain GI no abdominal pain no nausea or vomiting Musculoskeletal; I spoke to the OT assistant to has noted increasing edema in the right arm and also marked increase in tone  Physical exam Gen. frail woman but in no distress. Vitals blood pressure 112/86-temperature 98.7-pulse 69-respirations 18 her weight seems to have gone from 174 pounds on 2/10-207.6 pounds on 4/1 Respiratory; there is bibasilar crackles left greater than right with bronchial breathing likely reflects pleural  effusion Heart sounds are regular there is a 3/6 pansystolic murmur at the lower left sternal border which is probably tricuspid regurgitation JVP is elevated Abdomen no liver no spleen no tenderness GU she still has a Foley catheter in from her aggressive fluid replacement 7 weeks ago. Is a bit of an oversight although I think she is going to need to continue this for the short-term as I am likely going to need to increase her diuretics Neurologic; she does not have antigravity strength in her legs bilaterally her right arm is contracted at rest shoulder and elbow. There is edema. There is a soft right axillary lymph node but no masses in her breast  Impression/plan #1 currently probably in some degree of biventricular heart failure. In spite of the recent difficulties with hypernatremia she is going to need increase in her Lasix now start by giving her 40 mg additionally the night and 80 mg in the morning followed by 40 mg in the afternoon. I will recheck a BMP BNP on Monday #2 atrial correlation her ventricular rate is controlled. She was not felt to be a Coumadin candidate because of a  spontaneous bleed early in 2014 #3 cause of her neurologic disability is not completely clear to me. I have not been following this lady longitudinally however I suspect this is secondary to a multi-small stroke/multi-infarct state that. The patient is mostly cognitively intact but severely disabled physically. At this point she only has use of the left arm again the exact history and progression of this isn't totally clear although looking back into her records suggest that she was disabled in October 2014 although I am not going to debate that this is probably worse now. I am doubtful anything could be done to reverse this. #4 contractured right arm with edema. I suspect this is neurologic whether this has a central basis or it could be cervical cord compression I am uncertain. Again the chances that anything could be  done to meaningfully help her here in terms of investigations is negligibly unlikely

## 2013-10-11 ENCOUNTER — Ambulatory Visit: Payer: Medicare Other | Admitting: Cardiology

## 2013-10-15 ENCOUNTER — Other Ambulatory Visit: Payer: Self-pay | Admitting: *Deleted

## 2013-10-15 MED ORDER — HYDROCODONE-ACETAMINOPHEN 7.5-325 MG PO TABS: ORAL_TABLET | ORAL | Status: DC

## 2013-10-15 NOTE — Telephone Encounter (Signed)
[facility] 

## 2013-10-16 ENCOUNTER — Non-Acute Institutional Stay (SKILLED_NURSING_FACILITY): Payer: Medicare Other | Admitting: Internal Medicine

## 2013-10-16 DIAGNOSIS — I5032 Chronic diastolic (congestive) heart failure: Secondary | ICD-10-CM

## 2013-10-16 DIAGNOSIS — I509 Heart failure, unspecified: Secondary | ICD-10-CM

## 2013-10-18 NOTE — Progress Notes (Addendum)
Patient ID: [name], female   DOB: 17-May-1932, 80 y.o.   MRN: [id number]                  PROGRESS NOTE  DATE:  10/16/2013  FACILITY: [facility]    LEVEL OF CARE:   SNF   Acute Visit   HISTORY OF PRESENT ILLNESS:  [name] is a lady who has a history of severe hypernatremia in February of this year, likely secondary to a combination of over-diuresis, sodium bicarb, and fluid restriction.  We reduced her diuretics, replaced her free water deficit  , and she has done fairly well.  However, the staff are reporting increasing edema.  Reviewing her weights shows that she currently is at 207 pounds.  On 09/26/2013, she was also 207 pounds versus February 10th at 174.    REVIEW OF SYSTEMS:   GENERAL:  The patient is alert enough to report some degree of orthopnea and increasing edema.   CARDIAC:   She is not complaining of chest pain.   GI:  No abdominal pain.  No nausea, vomiting or diarrhea.    PHYSICAL EXAMINATION:   VITAL SIGNS:   O2 SATURATIONS:  97% on 2 L.   RESPIRATIONS:  18 and unlabored.   PULSE:  98.   CHEST/RESPIRATORY:  Shallow, but reasonably clear air entry bilaterally.    CARDIOVASCULAR:  CARDIAC:   Heart sounds are normal.  She has a pansystolic murmur, probably tricuspid regurgitation.  JVP is elevated at 45 degrees.   CIRCULATION:   EDEMA/VARICOSITIES:  Her lower extremities are wrapped with Kerlix/Coban.  However, the edema is significant in her bilateral thighs.  There is no evidence of a DVT.    ASSESSMENT/PLAN:    Right greater than left congestive heart failure.  I have her echocardiogram from November 2014 that showed an EF of 60-65%, intermittent diastolic dysfunction.  She has pulmonary hypertension, moderate tricuspid regurgitation.     I am going to increase her Lasix to 60 b.i.d.  We will follow her weights and basic metabolic panel.

## 2013-10-24 ENCOUNTER — Non-Acute Institutional Stay (SKILLED_NURSING_FACILITY): Payer: Medicare Other | Admitting: Internal Medicine

## 2013-10-24 ENCOUNTER — Other Ambulatory Visit: Payer: Self-pay | Admitting: *Deleted

## 2013-10-24 DIAGNOSIS — I509 Heart failure, unspecified: Secondary | ICD-10-CM

## 2013-10-24 DIAGNOSIS — N039 Chronic nephritic syndrome with unspecified morphologic changes: Secondary | ICD-10-CM

## 2013-10-24 DIAGNOSIS — K5900 Constipation, unspecified: Secondary | ICD-10-CM

## 2013-10-24 DIAGNOSIS — D631 Anemia in chronic kidney disease: Secondary | ICD-10-CM

## 2013-10-24 DIAGNOSIS — N182 Chronic kidney disease, stage 2 (mild): Secondary | ICD-10-CM

## 2013-10-24 DIAGNOSIS — I5032 Chronic diastolic (congestive) heart failure: Secondary | ICD-10-CM

## 2013-10-24 MED ORDER — ALPRAZOLAM 0.25 MG PO TABS: ORAL_TABLET | ORAL | Status: AC

## 2013-10-24 NOTE — Telephone Encounter (Signed)
[facility] 

## 2013-10-26 DIAGNOSIS — N182 Chronic kidney disease, stage 2 (mild): Secondary | ICD-10-CM | POA: Insufficient documentation

## 2013-10-26 NOTE — Progress Notes (Signed)
Patient ID: [name], female   DOB: 1932-06-28, 80 y.o.   MRN: [id number]            PROGRESS NOTE  DATE: 10/24/2013    FACILITY:  [facility]  LEVEL OF CARE: SNF (31)  Acute Visit  CHIEF COMPLAINT:  Manage renal insufficiency, constipation, and anemia of chronic kidney disease.    HISTORY OF PRESENT ILLNESS: I was requested by the staff to assess the patient regarding above problem(s):  CHRONIC KIDNEY DISEASE: The patient's chronic kidney disease remains stable.  Patient denies increasing lower extremity swelling or confusion. Last BUN and creatinine are:   On 10/22/2013:  BUN 62, creatinine 1.49.  On 10/08/2013:  BUN 47, creatinine 1.56.    CONSTIPATION: The constipation is an unstable problem. Patient is complaining of ongoing constipation.  No complications from the medications presently being used. Patient denies ongoing abdominal pain, nausea or vomiting.    ANEMIA: The anemia is an unstable problem. The patient denies fatigue, melena or hematochezia. No complications from the medications currently being used.  On 10/08/2013:   Hemoglobin 9.2, MCV 95.  In 07/2013:  Hemoglobin 11.    PAST MEDICAL HISTORY : Reviewed.  No changes/see problem list  CURRENT MEDICATIONS: Reviewed per MAR/see medication list  REVIEW OF SYSTEMS:  GENERAL: no change in appetite, no fatigue, no weight changes, no fever, chills or weakness RESPIRATORY: no cough, SOB, DOE,, wheezing, hemoptysis CARDIAC: no chest pain or palpitations;  chronic lower extremity swelling   GI: no abdominal pain, diarrhea, constipation, heart burn, nausea or vomiting  PHYSICAL EXAMINATION  VS:  T 98.2       P 84      RR 20      BP 132/88        WT (Lb) 176        GENERAL: no acute distress, moderately obese body habitus EYES: conjunctivae normal, sclerae normal, normal eye lids NECK: supple, trachea midline, no neck masses, no thyroid tenderness, no thyromegaly LYMPHATICS: no LAN in the neck, no  supraclavicular LAN RESPIRATORY: breathing is even & unlabored, BS CTAB CARDIAC: RRR, no murmur,no extra heart sounds; bilateral upper extremities have +2 edema, bilateral lower extremities wrapped   GI: abdomen soft, normal BS, no masses, no tenderness, no hepatomegaly, no splenomegaly PSYCHIATRIC: the patient is alert & oriented to person, affect & behavior appropriate  LABS/RADIOLOGY:   On 10/08/2013:  BNP 425.4.    ASSESSMENT/PLAN:    Chronic kidney disease.  Renal functions improved.    Anemia of chronic kidney disease.  Unstable problem.  Hemoglobin declined.  We will monitor.    Constipation.  Unstable problem.  Start MiraLAX 17 g q.d.    CHF.  Unstable problem.  Lasix was increased.    CPT CODE: 99833       Gayani Y Dasanayaka, [place] [phone]

## 2013-10-30 NOTE — Telephone Encounter (Signed)
error 

## 2013-11-07 ENCOUNTER — Non-Acute Institutional Stay (SKILLED_NURSING_FACILITY): Payer: Medicare Other | Admitting: Internal Medicine

## 2013-11-07 DIAGNOSIS — I150 Renovascular hypertension: Secondary | ICD-10-CM

## 2013-11-07 DIAGNOSIS — I509 Heart failure, unspecified: Secondary | ICD-10-CM

## 2013-11-07 DIAGNOSIS — E1129 Type 2 diabetes mellitus with other diabetic kidney complication: Secondary | ICD-10-CM

## 2013-11-07 DIAGNOSIS — I5032 Chronic diastolic (congestive) heart failure: Secondary | ICD-10-CM

## 2013-11-07 DIAGNOSIS — E039 Hypothyroidism, unspecified: Secondary | ICD-10-CM

## 2013-11-07 NOTE — Progress Notes (Signed)
                   PROGRESS NOTE  DATE: 11-07-13  FACILITY: Nursing Home Location: [facility]  LEVEL OF CARE: SNF (31)  Routine Visit  CHIEF COMPLAINT:  Manage DM, hypothyroidism & HTN  HISTORY OF PRESENT ILLNESS:  REASSESSMENT OF ONGOING PROBLEM(S):   DM:pt's DM remains stable.  Staff deny polyuria, polydipsia, polyphagia, changes in vision or hypoglycemic episodes.  No complications noted from the medication presently being used.  Last hemoglobin A1c is 6.1 in 4-15.  HTN: Pt 's HTN remains stable.  Staff Deny CP, sob, DOE, headaches, dizziness or visual disturbances.  No complications from the medications currently being used.  Last BP :136/88, 87/46.  HYPOTHYROIDISM: The hypothyroidism remains stable. No complications noted from the medications presently being used.  The staff deny fatigue or constipation.  Last TSH not available.  PAST MEDICAL HISTORY : Reviewed.  No changes.  CURRENT MEDICATIONS: Reviewed per [facility]  REVIEW OF SYSTEMS: Unobtainable due to patient being a poor historian  PHYSICAL EXAMINATION  VS:  See vital signs section  GENERAL: no acute distress, morbidly obese body habitus EYES: Unable to assess NECK: supple, trachea midline, no neck masses, no thyroid tenderness, no thyromegaly LYMPHATICS: no LAN in the neck, no supraclavicular LAN RESPIRATORY: breathing is even & unlabored, BS CTAB CARDIAC: heart rate is irregularly irregular, no murmur,no extra heart sounds,  BLE wrapped GI: abdomen soft, normal BS, no masses, no tenderness, no hepatomegaly, no splenomegaly PSYCHIATRIC: the patient is minimally  alert, affect & behavior appropriate, unable to assess orientation  LABS/RADIOLOGY: 4-15 hemoglobin 9.2, MCV 95 otherwise CBC normal, BUN 47, creatinine 1.56 otherwise BMP normal 12-14 Hb 9.3, mcv 95 ow cbc nl, total protein 5, albumin 3, alkaline phosphatase 137, ALT 43 otherwise he will profile normal 11-14 glc 219, cr 1.15 ow bmp nl,  FLP nl  ASSESSMENT/PLAN:  DM-well controlled HTN-blood pressure low. Will review a log. Hypothyroidism-check TSH CHF-well compensated. Lasix was increased. Anemia of chronic dz- stable Hyperlipidemia-check fasting lipid panel LE pain-well controlled. Atrial fibrillation-rate controlled. Depression-cont. Celexa. Constipation-MiraLax was started Hypokalemia-Kdur was increased  CPT CODE: 32951  Gayani Y. Durwin Reges, [place] [phone]

## 2013-11-12 ENCOUNTER — Ambulatory Visit: Payer: Medicare Other | Admitting: Cardiology

## 2013-11-16 ENCOUNTER — Other Ambulatory Visit: Payer: Self-pay | Admitting: *Deleted

## 2013-11-16 MED ORDER — HYDROCODONE-ACETAMINOPHEN 7.5-325 MG PO TABS: ORAL_TABLET | ORAL | Status: AC

## 2013-11-16 NOTE — Telephone Encounter (Signed)
[facility] 

## 2013-11-26 DIAGNOSIS — J90 Pleural effusion, not elsewhere classified: Secondary | ICD-10-CM

## 2013-11-26 HISTORY — DX: Pleural effusion, not elsewhere classified: J90

## 2013-11-28 ENCOUNTER — Non-Acute Institutional Stay (SKILLED_NURSING_FACILITY): Payer: Medicare Other | Admitting: Internal Medicine

## 2013-11-28 DIAGNOSIS — E1129 Type 2 diabetes mellitus with other diabetic kidney complication: Secondary | ICD-10-CM

## 2013-11-28 DIAGNOSIS — E039 Hypothyroidism, unspecified: Secondary | ICD-10-CM

## 2013-11-28 DIAGNOSIS — I509 Heart failure, unspecified: Secondary | ICD-10-CM

## 2013-11-28 DIAGNOSIS — I5032 Chronic diastolic (congestive) heart failure: Secondary | ICD-10-CM

## 2013-11-28 DIAGNOSIS — I150 Renovascular hypertension: Secondary | ICD-10-CM

## 2013-11-28 NOTE — Progress Notes (Signed)
                    PROGRESS NOTE  DATE: 11-28-13  FACILITY: Nursing Home Location: [facility]  LEVEL OF CARE: SNF (31)  Routine Visit  CHIEF COMPLAINT:  Manage DM, hypothyroidism & HTN  HISTORY OF PRESENT ILLNESS:  REASSESSMENT OF ONGOING PROBLEM(S):   DM:pt's DM remains stable.  Staff deny polyuria, polydipsia, polyphagia, changes in vision or hypoglycemic episodes.  No complications noted from the medication presently being used.  Last hemoglobin A1c is 6.1 in 4-15.  HTN: Pt 's HTN remains stable.  Staff Deny CP, sob, DOE, headaches, dizziness or visual disturbances.  No complications from the medications currently being used.  Last BP :136/88, 87/46, 128/78.  HYPOTHYROIDISM: The hypothyroidism remains stable. No complications noted from the medications presently being used.  The staff deny fatigue or constipation.  Last TSH 2.52 in 5-15  PAST MEDICAL HISTORY : Reviewed.  No changes.  CURRENT MEDICATIONS: Reviewed per [facility]  REVIEW OF SYSTEMS: Unobtainable due to patient being a poor historian  PHYSICAL EXAMINATION  VS:  See vital signs section  GENERAL: no acute distress, morbidly obese body habitus NECK: supple, trachea midline, no neck masses, no thyroid tenderness, no thyromegaly RESPIRATORY: breathing is even & unlabored, BS CTAB CARDIAC: heart rate is irregularly irregular, no murmur,no extra heart sounds,  BLE wrapped GI: abdomen soft, normal BS, no masses, no tenderness, no hepatomegaly, no splenomegaly PSYCHIATRIC: the patient is minimally  alert, affect & behavior appropriate, unable to assess orientation  LABS/RADIOLOGY: 5-15 fasting lipid panel normal 4-15 hemoglobin 9.2, MCV 95 otherwise CBC normal, BUN 47, creatinine 1.56 otherwise BMP normal 12-14 Hb 9.3, mcv 95 ow cbc nl, total protein 5, albumin 3, alkaline phosphatase 137, ALT 43 otherwise he will profile normal 11-14 glc 219, cr 1.15 ow bmp nl, FLP nl  ASSESSMENT/PLAN:  DM-well  controlled HTN-well controlled Hypothyroidism-well controlled CHF-well compensated.  Anemia of chronic dz- stable Hyperlipidemia-well controlled LE pain-well controlled. Atrial fibrillation-rate controlled. Depression-cont. Celexa. Constipation-on MiraLax Hypokalemia-on Kdur  CPT CODE: 34287  Gayani Y. Durwin Reges, [place] [phone]

## 2013-11-29 ENCOUNTER — Encounter (HOSPITAL_COMMUNITY): Payer: Self-pay | Admitting: Emergency Medicine

## 2013-11-29 ENCOUNTER — Inpatient Hospital Stay (HOSPITAL_COMMUNITY)
Admission: EM | Admit: 2013-11-29 | Discharge: 2013-12-26 | DRG: 291 | Disposition: E | Payer: Medicare Other | Attending: Internal Medicine | Admitting: Internal Medicine

## 2013-11-29 ENCOUNTER — Emergency Department (HOSPITAL_COMMUNITY): Payer: Medicare Other

## 2013-11-29 DIAGNOSIS — L89109 Pressure ulcer of unspecified part of back, unspecified stage: Secondary | ICD-10-CM | POA: Diagnosis present

## 2013-11-29 DIAGNOSIS — J90 Pleural effusion, not elsewhere classified: Secondary | ICD-10-CM

## 2013-11-29 DIAGNOSIS — N183 Chronic kidney disease, stage 3 unspecified: Secondary | ICD-10-CM

## 2013-11-29 DIAGNOSIS — N390 Urinary tract infection, site not specified: Secondary | ICD-10-CM

## 2013-11-29 DIAGNOSIS — I959 Hypotension, unspecified: Secondary | ICD-10-CM

## 2013-11-29 DIAGNOSIS — E785 Hyperlipidemia, unspecified: Secondary | ICD-10-CM | POA: Diagnosis present

## 2013-11-29 DIAGNOSIS — E039 Hypothyroidism, unspecified: Secondary | ICD-10-CM

## 2013-11-29 DIAGNOSIS — G9340 Encephalopathy, unspecified: Secondary | ICD-10-CM

## 2013-11-29 DIAGNOSIS — L98429 Non-pressure chronic ulcer of back with unspecified severity: Secondary | ICD-10-CM

## 2013-11-29 DIAGNOSIS — R609 Edema, unspecified: Secondary | ICD-10-CM

## 2013-11-29 DIAGNOSIS — Z79899 Other long term (current) drug therapy: Secondary | ICD-10-CM

## 2013-11-29 DIAGNOSIS — N179 Acute kidney failure, unspecified: Secondary | ICD-10-CM | POA: Diagnosis present

## 2013-11-29 DIAGNOSIS — E669 Obesity, unspecified: Secondary | ICD-10-CM | POA: Diagnosis present

## 2013-11-29 DIAGNOSIS — M1990 Unspecified osteoarthritis, unspecified site: Secondary | ICD-10-CM | POA: Diagnosis present

## 2013-11-29 DIAGNOSIS — L8991 Pressure ulcer of unspecified site, stage 1: Secondary | ICD-10-CM

## 2013-11-29 DIAGNOSIS — Z6829 Body mass index (BMI) 29.0-29.9, adult: Secondary | ICD-10-CM

## 2013-11-29 DIAGNOSIS — Z7401 Bed confinement status: Secondary | ICD-10-CM

## 2013-11-29 DIAGNOSIS — I509 Heart failure, unspecified: Secondary | ICD-10-CM | POA: Diagnosis present

## 2013-11-29 DIAGNOSIS — E1121 Type 2 diabetes mellitus with diabetic nephropathy: Secondary | ICD-10-CM | POA: Diagnosis present

## 2013-11-29 DIAGNOSIS — L97909 Non-pressure chronic ulcer of unspecified part of unspecified lower leg with unspecified severity: Secondary | ICD-10-CM | POA: Diagnosis present

## 2013-11-29 DIAGNOSIS — IMO0002 Reserved for concepts with insufficient information to code with codable children: Secondary | ICD-10-CM

## 2013-11-29 DIAGNOSIS — I83009 Varicose veins of unspecified lower extremity with ulcer of unspecified site: Secondary | ICD-10-CM

## 2013-11-29 DIAGNOSIS — F329 Major depressive disorder, single episode, unspecified: Secondary | ICD-10-CM | POA: Diagnosis present

## 2013-11-29 DIAGNOSIS — T502X5A Adverse effect of carbonic-anhydrase inhibitors, benzothiadiazides and other diuretics, initial encounter: Secondary | ICD-10-CM | POA: Diagnosis present

## 2013-11-29 DIAGNOSIS — R627 Adult failure to thrive: Secondary | ICD-10-CM | POA: Diagnosis present

## 2013-11-29 DIAGNOSIS — Z515 Encounter for palliative care: Secondary | ICD-10-CM

## 2013-11-29 DIAGNOSIS — G894 Chronic pain syndrome: Secondary | ICD-10-CM | POA: Diagnosis present

## 2013-11-29 DIAGNOSIS — Z87891 Personal history of nicotine dependence: Secondary | ICD-10-CM

## 2013-11-29 DIAGNOSIS — Z66 Do not resuscitate: Secondary | ICD-10-CM | POA: Diagnosis present

## 2013-11-29 DIAGNOSIS — R000 Tachycardia, unspecified: Secondary | ICD-10-CM | POA: Diagnosis present

## 2013-11-29 DIAGNOSIS — E876 Hypokalemia: Secondary | ICD-10-CM

## 2013-11-29 DIAGNOSIS — I482 Chronic atrial fibrillation, unspecified: Secondary | ICD-10-CM

## 2013-11-29 DIAGNOSIS — D638 Anemia in other chronic diseases classified elsewhere: Secondary | ICD-10-CM | POA: Diagnosis present

## 2013-11-29 DIAGNOSIS — R1013 Epigastric pain: Secondary | ICD-10-CM

## 2013-11-29 DIAGNOSIS — F3289 Other specified depressive episodes: Secondary | ICD-10-CM | POA: Diagnosis present

## 2013-11-29 DIAGNOSIS — I129 Hypertensive chronic kidney disease with stage 1 through stage 4 chronic kidney disease, or unspecified chronic kidney disease: Secondary | ICD-10-CM | POA: Diagnosis present

## 2013-11-29 DIAGNOSIS — N058 Unspecified nephritic syndrome with other morphologic changes: Secondary | ICD-10-CM | POA: Diagnosis present

## 2013-11-29 DIAGNOSIS — I5033 Acute on chronic diastolic (congestive) heart failure: Principal | ICD-10-CM

## 2013-11-29 DIAGNOSIS — J9600 Acute respiratory failure, unspecified whether with hypoxia or hypercapnia: Secondary | ICD-10-CM | POA: Diagnosis present

## 2013-11-29 DIAGNOSIS — L98499 Non-pressure chronic ulcer of skin of other sites with unspecified severity: Secondary | ICD-10-CM | POA: Diagnosis present

## 2013-11-29 DIAGNOSIS — I2789 Other specified pulmonary heart diseases: Secondary | ICD-10-CM | POA: Diagnosis present

## 2013-11-29 DIAGNOSIS — E8809 Other disorders of plasma-protein metabolism, not elsewhere classified: Secondary | ICD-10-CM | POA: Diagnosis present

## 2013-11-29 DIAGNOSIS — N2889 Other specified disorders of kidney and ureter: Secondary | ICD-10-CM

## 2013-11-29 DIAGNOSIS — R601 Generalized edema: Secondary | ICD-10-CM

## 2013-11-29 DIAGNOSIS — E1129 Type 2 diabetes mellitus with other diabetic kidney complication: Secondary | ICD-10-CM

## 2013-11-29 DIAGNOSIS — E875 Hyperkalemia: Secondary | ICD-10-CM

## 2013-11-29 DIAGNOSIS — I4891 Unspecified atrial fibrillation: Secondary | ICD-10-CM | POA: Diagnosis present

## 2013-11-29 DIAGNOSIS — F411 Generalized anxiety disorder: Secondary | ICD-10-CM | POA: Diagnosis present

## 2013-11-29 DIAGNOSIS — I872 Venous insufficiency (chronic) (peripheral): Secondary | ICD-10-CM | POA: Diagnosis present

## 2013-11-29 HISTORY — DX: Pleural effusion, not elsewhere classified: J90

## 2013-11-29 HISTORY — DX: Cardiac arrhythmia, unspecified: I49.9

## 2013-11-29 LAB — COMPREHENSIVE METABOLIC PANEL
ALK PHOS: 68 U/L (ref 39–117)
ALT: 15 U/L (ref 0–35)
AST: 21 U/L (ref 0–37)
Albumin: 3.6 g/dL (ref 3.5–5.2)
BUN: 83 mg/dL — AB (ref 6–23)
CALCIUM: 9.9 mg/dL (ref 8.4–10.5)
CO2: 28 mEq/L (ref 19–32)
CREATININE: 2.07 mg/dL — AB (ref 0.50–1.10)
Chloride: 106 mEq/L (ref 96–112)
GFR calc non Af Amer: 21 mL/min — ABNORMAL LOW (ref >90)
GFR, EST AFRICAN AMERICAN: 25 mL/min — AB (ref >90)
GLUCOSE: 135 mg/dL — AB (ref 70–99)
POTASSIUM: 6.8 meq/L — AB (ref 3.7–5.3)
Sodium: 146 mEq/L (ref 137–147)
TOTAL PROTEIN: 6.6 g/dL (ref 6.0–8.3)
Total Bilirubin: 0.5 mg/dL (ref 0.3–1.2)

## 2013-11-29 LAB — URINALYSIS, ROUTINE W REFLEX MICROSCOPIC
Bilirubin Urine: NEGATIVE
Glucose, UA: NEGATIVE mg/dL
Ketones, ur: NEGATIVE mg/dL
NITRITE: NEGATIVE
Specific Gravity, Urine: 1.019 (ref 1.005–1.030)
UROBILINOGEN UA: 0.2 mg/dL (ref 0.0–1.0)
pH: 5.5 (ref 5.0–8.0)

## 2013-11-29 LAB — CBC WITH DIFFERENTIAL/PLATELET
BASOS PCT: 0 % (ref 0–1)
Basophils Absolute: 0 10*3/uL (ref 0.0–0.1)
EOS ABS: 0 10*3/uL (ref 0.0–0.7)
EOS PCT: 0 % (ref 0–5)
HEMATOCRIT: 35 % — AB (ref 36.0–46.0)
HEMOGLOBIN: 10.7 g/dL — AB (ref 12.0–15.0)
LYMPHS ABS: 0.4 10*3/uL — AB (ref 0.7–4.0)
Lymphocytes Relative: 8 % — ABNORMAL LOW (ref 12–46)
MCH: 31.7 pg (ref 26.0–34.0)
MCHC: 30.6 g/dL (ref 30.0–36.0)
MCV: 103.6 fL — AB (ref 78.0–100.0)
MONO ABS: 0.3 10*3/uL (ref 0.1–1.0)
MONOS PCT: 5 % (ref 3–12)
Neutro Abs: 4.7 10*3/uL (ref 1.7–7.7)
Neutrophils Relative %: 87 % — ABNORMAL HIGH (ref 43–77)
Platelets: 151 10*3/uL (ref 150–400)
RBC: 3.38 MIL/uL — AB (ref 3.87–5.11)
RDW: 17 % — ABNORMAL HIGH (ref 11.5–15.5)
WBC: 5.4 10*3/uL (ref 4.0–10.5)

## 2013-11-29 LAB — BASIC METABOLIC PANEL
BUN: 83 mg/dL — ABNORMAL HIGH (ref 6–23)
CALCIUM: 10.1 mg/dL (ref 8.4–10.5)
CHLORIDE: 107 meq/L (ref 96–112)
CO2: 29 meq/L (ref 19–32)
Creatinine, Ser: 2.05 mg/dL — ABNORMAL HIGH (ref 0.50–1.10)
GFR calc Af Amer: 25 mL/min — ABNORMAL LOW (ref >90)
GFR calc non Af Amer: 22 mL/min — ABNORMAL LOW (ref >90)
GLUCOSE: 114 mg/dL — AB (ref 70–99)
Potassium: 6.7 mEq/L (ref 3.7–5.3)
SODIUM: 148 meq/L — AB (ref 137–147)

## 2013-11-29 LAB — URINE MICROSCOPIC-ADD ON

## 2013-11-29 LAB — GLUCOSE, CAPILLARY
GLUCOSE-CAPILLARY: 98 mg/dL (ref 70–99)
Glucose-Capillary: 98 mg/dL (ref 70–99)
Glucose-Capillary: 100 mg/dL — ABNORMAL HIGH (ref 70–99)
Glucose-Capillary: 101 mg/dL — ABNORMAL HIGH (ref 70–99)

## 2013-11-29 LAB — POTASSIUM: POTASSIUM: 2.6 meq/L — AB (ref 3.7–5.3)

## 2013-11-29 LAB — TROPONIN I

## 2013-11-29 LAB — PRO B NATRIURETIC PEPTIDE: PRO B NATRI PEPTIDE: 6196 pg/mL — AB (ref 0–450)

## 2013-11-29 LAB — MRSA PCR SCREENING: MRSA BY PCR: NEGATIVE

## 2013-11-29 MED ORDER — ACETAMINOPHEN 325 MG PO TABS: 650.0000 mg | ORAL_TABLET | Freq: Four times a day (QID) | ORAL | Status: DC | PRN

## 2013-11-29 MED ORDER — SODIUM CHLORIDE 0.9 % IJ SOLN
3.0000 mL | Freq: Two times a day (BID) | INTRAMUSCULAR | Status: DC
  Administered 2013-11-29 – 2013-12-01 (×6): 3 mL via INTRAVENOUS

## 2013-11-29 MED ORDER — BISACODYL 10 MG RE SUPP
10.0000 mg | Freq: Every day | RECTAL | Status: DC | PRN
  Administered 2013-11-30: 10 mg via RECTAL
  Filled 2013-11-29: qty 1

## 2013-11-29 MED ORDER — OXYCODONE HCL 5 MG PO TABS: 5.0000 mg | ORAL_TABLET | ORAL | Status: DC | PRN

## 2013-11-29 MED ORDER — FUROSEMIDE 10 MG/ML IJ SOLN
40.0000 mg | Freq: Two times a day (BID) | INTRAMUSCULAR | Status: DC
  Administered 2013-11-29 – 2013-11-30 (×2): 40 mg via INTRAVENOUS
  Filled 2013-11-29 (×4): qty 4

## 2013-11-29 MED ORDER — ENOXAPARIN SODIUM 30 MG/0.3ML ~~LOC~~ SOLN
30.0000 mg | SUBCUTANEOUS | Status: DC
  Administered 2013-11-29 – 2013-11-30 (×2): 30 mg via SUBCUTANEOUS
  Filled 2013-11-29 (×2): qty 0.3

## 2013-11-29 MED ORDER — SODIUM BICARBONATE 8.4 % IV SOLN
50.0000 meq | Freq: Once | INTRAVENOUS | Status: AC
  Administered 2013-11-29: 50 meq via INTRAVENOUS

## 2013-11-29 MED ORDER — SODIUM CHLORIDE 0.9 % IV SOLN
Freq: Once | INTRAVENOUS | Status: AC
  Administered 2013-11-29: 05:00:00 via INTRAVENOUS

## 2013-11-29 MED ORDER — INSULIN ASPART 100 UNIT/ML ~~LOC~~ SOLN
10.0000 [IU] | Freq: Once | SUBCUTANEOUS | Status: AC
  Administered 2013-11-29: 10 [IU] via INTRAVENOUS
  Filled 2013-11-29: qty 1

## 2013-11-29 MED ORDER — CEFTRIAXONE SODIUM 1 G IJ SOLR
1.0000 g | INTRAMUSCULAR | Status: DC
  Administered 2013-11-30: 1 g via INTRAVENOUS
  Filled 2013-11-29 (×2): qty 10

## 2013-11-29 MED ORDER — ONDANSETRON HCL 4 MG/2ML IJ SOLN: 4.0000 mg | Freq: Three times a day (TID) | INTRAMUSCULAR | Status: DC | PRN

## 2013-11-29 MED ORDER — SODIUM BICARBONATE 4 % IV SOLN: 5.0000 mL | Freq: Once | INTRAVENOUS | Status: DC

## 2013-11-29 MED ORDER — INSULIN ASPART 100 UNIT/ML ~~LOC~~ SOLN
0.0000 [IU] | SUBCUTANEOUS | Status: DC
  Administered 2013-11-30: 1 [IU] via SUBCUTANEOUS

## 2013-11-29 MED ORDER — ONDANSETRON HCL 4 MG/2ML IJ SOLN: 4.0000 mg | Freq: Four times a day (QID) | INTRAMUSCULAR | Status: DC | PRN

## 2013-11-29 MED ORDER — ACETAMINOPHEN 650 MG RE SUPP
650.0000 mg | Freq: Four times a day (QID) | RECTAL | Status: DC | PRN
Start: 2013-11-29 — End: 2013-12-02

## 2013-11-29 MED ORDER — POTASSIUM CHLORIDE 10 MEQ/100ML IV SOLN
10.0000 meq | INTRAVENOUS | Status: AC
  Administered 2013-11-29: 10 meq via INTRAVENOUS
  Filled 2013-11-29 (×3): qty 100

## 2013-11-29 MED ORDER — LORAZEPAM 2 MG/ML IJ SOLN
0.5000 mg | INTRAMUSCULAR | Status: DC | PRN
  Administered 2013-11-29: 0.5 mg via INTRAVENOUS
  Filled 2013-11-29: qty 1

## 2013-11-29 MED ORDER — DEXTROSE 5 % IV SOLN
1.0000 g | Freq: Once | INTRAVENOUS | Status: AC
  Administered 2013-11-29: 1 g via INTRAVENOUS
  Filled 2013-11-29: qty 10

## 2013-11-29 MED ORDER — MAGNESIUM SULFATE 40 MG/ML IJ SOLN
2.0000 g | INTRAMUSCULAR | Status: AC
  Administered 2013-11-29: 2 g via INTRAVENOUS
  Filled 2013-11-29: qty 50

## 2013-11-29 MED ORDER — HYDROMORPHONE HCL PF 1 MG/ML IJ SOLN
0.5000 mg | INTRAMUSCULAR | Status: DC | PRN
  Administered 2013-11-30: 1 mg via INTRAVENOUS
  Filled 2013-11-29: qty 1

## 2013-11-29 MED ORDER — CALCIUM GLUCONATE 10 % IV SOLN
1.0000 g | Freq: Once | INTRAVENOUS | Status: AC
  Administered 2013-11-29: 1 g via INTRAVENOUS
  Filled 2013-11-29: qty 10

## 2013-11-29 MED ORDER — POTASSIUM CHLORIDE 10 MEQ/100ML IV SOLN
10.0000 meq | Freq: Once | INTRAVENOUS | Status: AC
  Administered 2013-11-29: 10 meq via INTRAVENOUS
  Filled 2013-11-29: qty 100

## 2013-11-29 MED ORDER — ONDANSETRON HCL 4 MG PO TABS: 4.0000 mg | ORAL_TABLET | Freq: Four times a day (QID) | ORAL | Status: DC | PRN

## 2013-11-29 MED ORDER — FUROSEMIDE 10 MG/ML IJ SOLN
40.0000 mg | INTRAMUSCULAR | Status: AC
Start: 2013-11-29 — End: 2013-11-29
  Administered 2013-11-29: 40 mg via INTRAVENOUS
  Filled 2013-11-29: qty 4

## 2013-11-29 MED ORDER — DEXTROSE 50 % IV SOLN
1.0000 | Freq: Once | INTRAVENOUS | Status: DC
  Filled 2013-11-29: qty 50

## 2013-11-29 MED ORDER — SODIUM CHLORIDE 0.9 % IJ SOLN: 3.0000 mL | INTRAMUSCULAR | Status: DC | PRN

## 2013-11-29 MED ORDER — SODIUM BICARBONATE 8.4 % IV SOLN
INTRAVENOUS | Status: AC
  Administered 2013-11-29: 50 meq
  Filled 2013-11-29: qty 50

## 2013-11-29 MED ORDER — SODIUM CHLORIDE 0.9 % IV SOLN: 250.0000 mL | INTRAVENOUS | Status: DC | PRN

## 2013-11-29 MED ORDER — MORPHINE SULFATE 2 MG/ML IJ SOLN
1.0000 mg | INTRAMUSCULAR | Status: DC | PRN
  Administered 2013-11-29 – 2013-11-30 (×3): 1 mg via INTRAVENOUS
  Filled 2013-11-29 (×3): qty 1

## 2013-11-29 NOTE — ED Notes (Signed)
Sabra Heck, MD notified of pt's critical potassium.

## 2013-11-29 NOTE — Clinical Social Work Psychosocial (Signed)
Clinical Social Work Department BRIEF PSYCHOSOCIAL ASSESSMENT 12/05/2013  Patient:  [name], [name]     Account Number:  [id number]     Admit date:  12/13/2013  Clinical Social Worker:  Lovey Newcomer  Date/Time:  11/26/2013 02:30 PM  Referred by:  Physician  Date Referred:  12/03/2013 Referred for  SNF Placement   Other Referral:   Interview type:  Family Other interview type:   Patient unable to contribute to assessment. CSW interviewed patient's son and daughter inlaw by phone.    PSYCHOSOCIAL DATA Living Status:  FACILITY Admitted from facility:  [facility] Level of care:  [place] Primary support name:  [name] and [name] Primary support relationship to patient:  CHILD, ADULT Degree of support available:   Support is good.    CURRENT CONCERNS Current Concerns  Post-Acute Placement   Other Concerns:    SOCIAL WORK ASSESSMENT / PLAN CSW notes that patient is admitted from [facility], and family confirms this. Patient has been at [facility] since October of 2014. Family reports that they plan for patient to return at discharge and are happy with the care she receives at the facility. Family is concerned that patient may not improve, stating, "We would like for her to go back to [facility] IF she improves." CSW offered emotional support to family and assured family that [facility] will assistance with discharge and offer support to family.   Assessment/plan status:  Psychosocial Support/Ongoing Assessment of Needs Other assessment/ plan:   Complete FL2, Fax, PASRR   Information/referral to community resources:   CSW contact information given to family.    PATIENT'S/FAMILY'S RESPONSE TO PLAN OF CARE: Patient's family plans for patient to return to [facility] at discharge. Patient's family was engaged in assessment and appreciative of CSW contact. CSW will assist with DC when appropriate.       Liz Beach MSW, [place], [place],  [phone]

## 2013-11-29 NOTE — Progress Notes (Signed)
Return call from Vivien Rota, Curator, who stated Dr. Sheran Fava would be MD caring for pt.  Texted paged Dr. Sheran Fava to inform of pt. Arrival to [phone].  Will continue to monitor.  Alphonzo Lemmings, RN

## 2013-11-29 NOTE — H&P (Addendum)
[facility] History and Physical  [name] AST:[id number] DOB: [birth year] DOA: 12/21/2013  Referring physician: EDP PCP: Cathlean Cower, MD /  [facility]:   Chief Complaint: [name]  HPI: [name] is a 80 y.o. female with Multiple Medical Problems who was sent from the [facility] to the ED due to Unresponsiveness found in the evening.   Per staff and family patient at baseline is bed-bound but she is verbal and interactive.  This was a sudden change.   Patient was found with [place], and placed on supplemental O2 in the ED.  She was evaluated and found on chest X-ray to have a large Right Sided Effusion with complete White out of the Right Lung and Pulmonary vascular congestion of the right lung.   She was also found by labs to have a UTI, and AKI and hypokalemia.    Her family discussed treatment options and wished for patient to be made comfortable and not pursue aggressive measures.      Review of Systems: Unable to Obtain from the patient  Past Medical History  Diagnosis Date  . Obesity   . Anemia, iron deficiency   . Depression   . Diabetes mellitus type II   . Hypertension   . Peptic ulcer   . History of uterine fibroid   . Hx of colonic polyps   . Diverticula, colon   . Osteoarthritis   . Low back pain   . Pulmonary hypertension     a. echo 4/12:  EF 55-60%, mod LVH, mild BAE, RVE and PASP 51;  b.  Echo (04/2013):  Mild LVH, EF 60-65%, mild MAC, mild MR, mod LAE, mild RVE, normal RVF, mod RAE, mod TR, PASP 65  . Anxiety   . HYPERLIPIDEMIA 03/05/2007  . ANEMIA-IRON DEFICIENCY 03/05/2007  . Right-sided heart failure 03/05/2007  . LOW BACK PAIN 03/05/2007  . Chronic pain syndrome 06/23/2010  . Hypothyroidism 10/15/2010  . CKD (chronic kidney disease) stage 3, GFR 30-59 ml/min 10/15/2010  . Anemia of chronic disease 05/11/2013  . Atrial fibrillation     permanent, on coumadin;  coumadin stopped at time of spontaneous rectus  sheat hematoma 04/2013  . Anasarca   . CHF (congestive heart failure)     Past Surgical History  Procedure Laterality Date  . Laparoscopic hysterectomy    . Colonoscopy  2009     Prior to Admission medications   Medication Sig Start Date End Date Taking? Authorizing Provider  acetaminophen (TYLENOL) 325 MG tablet Take 2 tablets (650 mg total) by mouth every 4 (four) hours as needed for headache or mild pain. 08/01/13   Rande Brunt, NP  ALPRAZolam Duanne Moron) 0.25 MG tablet Take one tablet by mouth twice daily as needed for anxiety 10/24/13   Estill Dooms, MD  atorvastatin (LIPITOR) 20 MG tablet Take 1 tablet (20 mg total) by mouth daily at 6 PM. 04/25/13   Charlynne Cousins, MD  bisacodyl (DULCOLAX) 10 MG suppository Place 10 mg rectally daily as needed for moderate constipation.    Historical Provider, MD  Cholecalciferol (VITAMIN D) 1000 UNITS capsule Take 1,000 Units by mouth daily.     Historical Provider, MD  citalopram (CELEXA) 20 MG tablet Take 20 mg by mouth daily.    Historical Provider, MD  diltiazem (CARDIZEM LA) 240 MG 24 hr tablet Take 1 tablet (240 mg total) by mouth daily. 08/01/13   Rande Brunt, NP  docusate sodium (COLACE)  100 MG capsule Take 100 mg by mouth 2 (two) times daily as needed for mild constipation.    Historical Provider, MD  ferrous gluconate (FERGON) 325 MG tablet Take 325 mg by mouth daily with breakfast.    Historical Provider, MD  furosemide (LASIX) 40 MG tablet Take 40 mg by mouth daily. One 40 mg tab daily    Historical Provider, MD  HYDROcodone-acetaminophen (NORCO) 7.5-325 MG per tablet Take one tablet by mouth four times daily for pain 11/16/13   Tiffany L Reed, DO  insulin lispro (HUMALOG) 100 UNIT/ML injection Inject 0-8 Units into the skin 4 (four) times daily -  before meals and at bedtime. 201-250=2units, 251-300=4 units, 301=35-=6units, 351-400=8units    Historical Provider, MD  labetalol (NORMODYNE) 300 MG tablet Take 150 mg by mouth 2 (two)  times daily.    Historical Provider, MD  levothyroxine (SYNTHROID, LEVOTHROID) 25 MCG tablet Take 25 mcg by mouth daily before breakfast.    Historical Provider, MD  Multiple Vitamins-Minerals (MULTIVITAMIN WITH MINERALS) tablet Take 1 tablet by mouth daily.    Historical Provider, MD  ondansetron (ZOFRAN) 4 MG tablet Take 4 mg by mouth every 8 (eight) hours as needed for nausea or vomiting.    Historical Provider, MD  oxyCODONE (OXY IR/ROXICODONE) 5 MG immediate release tablet Take 1 tablet (5 mg total) by mouth every 4 (four) hours as needed for severe pain (prior to dressing change). 08/02/13   Pricilla Larsson, NP  zinc sulfate 220 MG capsule Take 220 mg by mouth daily.    Historical Provider, MD      Allergies  Allergen Reactions  . Clonidine Derivatives Itching and Rash    Skin breaks out     Social History:  Per medical REcords   reports that she quit smoking about 40 years ago. She started smoking about 41 years ago. She has never used smokeless tobacco. She reports that she does not drink alcohol or use illicit drugs.     Family History  Problem Relation Age of Onset  . Hypertension Sister        Physical Exam:  GEN:  Obtunded Elderly Edematous  80 y.o. African American female  examined and in Acute distress;  Filed Vitals:   12/18/2013 0515 2013/12/18 0530 2013-12-18 0545 18-Dec-2013 0622  BP: 112/62 114/67    Pulse:      Temp:      TempSrc:      Resp:   14 14  SpO2:    100%   Blood pressure 114/67, pulse 163, temperature 97 F (36.1 C), temperature source Rectal, resp. rate 14, SpO2 100.00%. PSYCH: She is alert and oriented x0;  HEENT: Normocephalic and Atraumatic, Mucous membranes pink; PERRLA; EOM intact; Fundi:  Benign;  No scleral icterus, Nares: Patent, Oropharynx: Clear, Edentulous, Neck:  FROM, no cervical lymphadenopathy nor thyromegaly or carotid bruit; no JVD; Breasts:: Not examined CHEST WALL: No tenderness CHEST: Labored Breathing and Swallow, with Decreased  Breath Sounds on Right,  And  +Basilar rales on LEFT.   HEART: Regular rate and rhythm; no murmurs rubs or gallops BACK: No kyphosis or scoliosis; no CVA tenderness ABDOMEN: Positive Bowel Sounds, Obese, soft non-tender; no masses, no organomegaly Rectal Exam: Not done EXTREMITIES: BLEs in [employer] +Venous Stasis ulcerations,    Genitalia: not examined PULSES: 2+ and symmetric SKIN: Normal hydration no rash or ulceration CNS:  Obtunded,  Minimally responsive to palpation, withdraws to painful stimuli.    Vascular:    Peripheral pulses  Non-Palpable   Labs on Admission:  Basic Metabolic Panel:  Recent Labs Lab 2013/12/10 0242 December 10, 2013 0502  NA 146  --   K 6.8* 2.6*  CL 106  --   CO2 28  --   GLUCOSE 135*  --   BUN 83*  --   CREATININE 2.07*  --   CALCIUM 9.9  --    Liver Function Tests:  Recent Labs Lab December 10, 2013 0242  AST 21  ALT 15  ALKPHOS 68  BILITOT 0.5  PROT 6.6  ALBUMIN 3.6   No results found for this basename: LIPASE, AMYLASE,  in the last 168 hours No results found for this basename: AMMONIA,  in the last 168 hours CBC:  Recent Labs Lab Dec 10, 2013 0242  WBC 5.4  NEUTROABS 4.7  HGB 10.7*  HCT 35.0*  MCV 103.6*  PLT 151   Cardiac Enzymes:  Recent Labs Lab Dec 10, 2013 0242  TROPONINI <0.30    BNP (last 3 results)  Recent Labs  07/21/13 0415 08/08/13 1245 12/10/13 0242  PROBNP 4244.0* 7263.0* 6196.0*   CBG: No results found for this basename: GLUCAP,  in the last 168 hours  Radiological Exams on Admission: Ct Head Wo Contrast  2013/12/10   CLINICAL DATA:  Severe headache  EXAM: CT HEAD WITHOUT CONTRAST  TECHNIQUE: Contiguous axial images were obtained from the base of the skull through the vertex without intravenous contrast.  COMPARISON:  Prior CT from 05/10/2013  FINDINGS: Study is degraded by motion artifact.  Generalized cerebral atrophy with advanced chronic microvascular ischemic changes again noted, stable from prior. Remote lacunar  infarct within the left basal ganglia is also stable.  No acute large vessel territory infarct or intracranial hemorrhage identified. No mass lesion or midline shift. No hydrocephalus. There is no extra-axial fluid collection.  Calvarium is intact. Scalp soft tissues within normal limits. No acute abnormality seen about the orbits.  Paranasal sinuses are clear. Mild scattered opacity noted within the mastoid air cells bilaterally, left worse than right.  IMPRESSION: 1. No acute intracranial abnormality. 2. Generalized cerebral atrophy with chronic microvascular ischemic disease, unchanged.   Electronically Signed   By: Jeannine Boga M.D.   On: 12-10-2013 03:07   Dg Chest Port 1 View  12/10/13   CLINICAL DATA:  Shortness of breath  EXAM: PORTABLE CHEST - 1 VIEW  COMPARISON:  07/27/2013  FINDINGS: Dema Severin out of the right chest, with no visible aerated lung. Evaluation for volume loss or expansion is limited due to rightward rotation. The appearance of the trachea and heart favors volume gain, usually from pleural effusion. A pleural effusion was noted on the right earlier this year. There is pulmonary venous congestion notable on the left. No pneumothorax.  IMPRESSION: No aerated lung on the right, likely from large right pleural effusion. Evaluation is limited by patient rotation, suggest repeat chest x-ray or CT prior to therapeutic intervention.   Electronically Signed   By: Jorje Guild M.D.   On: 2013-12-10 02:45      EKG: Independently reviewed. Artrial fibrillation rate 98    Assessment/Plan:   80 y.o. female with  Active Problems:   Acute respiratory failure   Acute on chronic diastolic heart failure- EF 60-65% 11/14   UTI (lower urinary tract infection)   Anasarca   Pleural effusion   Chronic atrial fibrillation   Failure to thrive- SNF pt   Venous stasis ulcers   Diabetes mellitus with diabetic nephropathy   Hypokalemia   Chronic kidney disease, stage  III (moderate)   Acute  encephalopathy   Ulcer of sacral region, stage 1   Hypotension     1.    Acute Respiratory Failure-   Due to large Right Pleural Effusionand Pulmonary Vascular congestion on Left - Family does not wish to pursue Thoracentesis or BIPAP for therapy, Patient Is Hypotensive and DNR and can not be given further  IV diuretics.      Spoke with Family and they are in agreement to keep patient comfortable and institute comfort care measures if needed, and Palliative Care consult .     Placed on 100% NCO2.     2.   Anasarca/Failure to Thrive and Hypoalbuminemia-  Progressing.    3.   Acute on Chronic Diastolic CHF-   See #1.   Given IV lasix X 1 dose, now hypotensive.     3.  UTI-  Urine C+S sent,  Placed on IV Rocephin.     4.   Acute Encephalopathy- due to #1,    5.   Hypotension-  Due to Lasix.       6.  DM-  SSI Coverage PRN.     7.   Hypokalemia-    Replete KCl and Magnesium.     8.   Chronic Atrial Fibrillation-  Rate controlled.     9.   Sacral Ulcer Stage I- Wound Care  PRN.    10.  Venous Stasis Ulcers of BLE-  Has Unna Boots on both Lower Extremities.     11.  DVT prophylaxis with Lovenox.        Code Status:  DO NOT RESUSCITATE Family Communication:    Family at Bedside Disposition Plan:       Inpatient   Time spent:  [facility] Pager [phone]  If 7PM-7AM, please contact night-coverage [email or web address] Password [facility] 12/20/2013, 6:54 AM

## 2013-11-29 NOTE — ED Notes (Signed)
Accidentally validated "0" for breaths for this patient.  Her breathing is so soft the monitor is not picking up the breaths.  She is breathing between 12RR/min and 16RR/min.

## 2013-11-29 NOTE — Progress Notes (Signed)
CRITICAL VALUE ALERT  Critical value received:  Potassium 6.7  Date of notification:  12/08/2013  Time of notification:  1030  Critical value read back:yes  Nurse who received alert:  Zenon Mayo  MD notified (1st page):  Dr. Sheran Fava  Time of first page:  1055  Responding MD:  Dr. Sheran Fava  Time MD responded:  [phone]

## 2013-11-29 NOTE — ED Notes (Signed)
According to EMS, personnel at [facility] advise that the patient was "unresponsive" and her oxygen levels were in the 80's and they had her on a nasal cannula.  According to EMS, the patient was "guppie" breathing and they placed her on a non-rebreather.  Once on the non-breather, the patient's oxygen levels came up to 100%.  She also was alert to voice.  The patient's GCS-9 she is non-verbal but does respond to voice.  Her right arm is swollen and is her hand is contracted, this is normal for her.  Patient was brought to the ED to be evaluated.

## 2013-11-29 NOTE — ED Provider Notes (Signed)
CSN: [id number]     Arrival date & time 12/24/2013  0134 History   First MD Initiated Contact with Patient 12/01/2013 0216     Chief Complaint  Patient presents with  . Altered Mental Status    According to EMS, personnel at [facility] advise that the patient was "unresponsive" and her oxygen levels were in the 80's and they had her on a nasal cannula.  According to EMS, the patient was "guppie" breathing and they placed her on a nonrebreather.     (Consider location/radiation/quality/duration/timing/severity/associated sxs/prior Treatment) HPI Comments: The patient is an 80 year old female with a history of DO NOT RESUSCITATE orders, a history of morbid obesity diabetes, hypertension, pulmonary hypertension, chronic pain, anemia, atrial fibrillation and hypothyroidism with stage III chronic kidney disease and congestive heart failure. Because of anasarca the patient has her legs wrapped with compression wraps constantly. She is bed bound, usually is able to talk, has a chronic right contracture of the upper extremity. She was found at the nursing home this evening with severe decreased level of consciousness, unable to talk, unable to respond, opened eyes to voice but did not follow commands. She was placed on a nonrebreather secondary to hypoxia, transported to the hospital, the patient is unable to give any history as she is nonverbal on arrival  Patient is a 80 y.o. female presenting with altered mental status. The history is provided by the EMS personnel, the nursing home and medical records.  Altered Mental Status   Past Medical History  Diagnosis Date  . Obesity   . Anemia, iron deficiency   . Depression   . Diabetes mellitus type II   . Hypertension   . Peptic ulcer   . History of uterine fibroid   . Hx of colonic polyps   . Diverticula, colon   . Osteoarthritis   . Low back pain   . Pulmonary hypertension     a. echo 4/12:  EF 55-60%, mod LVH, mild BAE, RVE and PASP 51;  b.  Echo  (04/2013):  Mild LVH, EF 60-65%, mild MAC, mild MR, mod LAE, mild RVE, normal RVF, mod RAE, mod TR, PASP 65  . Anxiety   . HYPERLIPIDEMIA 03/05/2007  . ANEMIA-IRON DEFICIENCY 03/05/2007  . Right-sided heart failure 03/05/2007  . LOW BACK PAIN 03/05/2007  . Chronic pain syndrome 06/23/2010  . Hypothyroidism 10/15/2010  . CKD (chronic kidney disease) stage 3, GFR 30-59 ml/min 10/15/2010  . Anemia of chronic disease 05/11/2013  . Atrial fibrillation     permanent, on coumadin;  coumadin stopped at time of spontaneous rectus sheat hematoma 04/2013  . Anasarca   . CHF (congestive heart failure)    Past Surgical History  Procedure Laterality Date  . Laparoscopic hysterectomy    . Colonoscopy  2009   Family History  Problem Relation Age of Onset  . Hypertension Sister    History  Substance Use Topics  . Smoking status: Former Smoker    Start date: 03/23/1972    Quit date: 05/10/1973  . Smokeless tobacco: Never Used  . Alcohol Use: No   OB History   Grav Para Term Preterm Abortions TAB SAB Ect Mult Living                 Review of Systems  Unable to perform ROS: Acuity of condition      Allergies  Clonidine derivatives  Home Medications   Prior to Admission medications   Medication Sig Start Date End Date Taking? Authorizing  Provider  acetaminophen (TYLENOL) 325 MG tablet Take 2 tablets (650 mg total) by mouth every 4 (four) hours as needed for headache or mild pain. 08/01/13   Rande Brunt, NP  ALPRAZolam Duanne Moron) 0.25 MG tablet Take one tablet by mouth twice daily as needed for anxiety 10/24/13   Estill Dooms, MD  atorvastatin (LIPITOR) 20 MG tablet Take 1 tablet (20 mg total) by mouth daily at 6 PM. 04/25/13   Charlynne Cousins, MD  bisacodyl (DULCOLAX) 10 MG suppository Place 10 mg rectally daily as needed for moderate constipation.    Historical Provider, MD  Cholecalciferol (VITAMIN D) 1000 UNITS capsule Take 1,000 Units by mouth daily.     Historical Provider, MD   citalopram (CELEXA) 20 MG tablet Take 20 mg by mouth daily.    Historical Provider, MD  diltiazem (CARDIZEM LA) 240 MG 24 hr tablet Take 1 tablet (240 mg total) by mouth daily. 08/01/13   Rande Brunt, NP  docusate sodium (COLACE) 100 MG capsule Take 100 mg by mouth 2 (two) times daily as needed for mild constipation.    Historical Provider, MD  ferrous gluconate (FERGON) 325 MG tablet Take 325 mg by mouth daily with breakfast.    Historical Provider, MD  furosemide (LASIX) 40 MG tablet Take 40 mg by mouth daily. One 40 mg tab daily    Historical Provider, MD  HYDROcodone-acetaminophen (NORCO) 7.5-325 MG per tablet Take one tablet by mouth four times daily for pain 11/16/13   Tiffany L Reed, DO  insulin lispro (HUMALOG) 100 UNIT/ML injection Inject 0-8 Units into the skin 4 (four) times daily -  before meals and at bedtime. 201-250=2units, 251-300=4 units, 301=35-=6units, 351-400=8units    Historical Provider, MD  labetalol (NORMODYNE) 300 MG tablet Take 150 mg by mouth 2 (two) times daily.    Historical Provider, MD  levothyroxine (SYNTHROID, LEVOTHROID) 25 MCG tablet Take 25 mcg by mouth daily before breakfast.    Historical Provider, MD  Multiple Vitamins-Minerals (MULTIVITAMIN WITH MINERALS) tablet Take 1 tablet by mouth daily.    Historical Provider, MD  ondansetron (ZOFRAN) 4 MG tablet Take 4 mg by mouth every 8 (eight) hours as needed for nausea or vomiting.    Historical Provider, MD  oxyCODONE (OXY IR/ROXICODONE) 5 MG immediate release tablet Take 1 tablet (5 mg total) by mouth every 4 (four) hours as needed for severe pain (prior to dressing change). 08/02/13   Pricilla Larsson, NP  zinc sulfate 220 MG capsule Take 220 mg by mouth daily.    Historical Provider, MD   BP 114/67  Pulse 163  Temp(Src) 97 F (36.1 C) (Rectal)  Resp 14  SpO2 100% Physical Exam  Nursing note and vitals reviewed. Constitutional: She appears distressed.  Obese, anasarca, tachypneic, somnolent, arousable to  voice briefly  HENT:  Head: Normocephalic and atraumatic.  Eyes: Conjunctivae are normal. Right eye exhibits no discharge. Left eye exhibits no discharge. No scleral icterus.  Pupils 6 mm to 7 mm symmetrical, nonreactive  Neck: Normal range of motion. Neck supple. No JVD present. No thyromegaly present.  Cardiovascular: Normal rate, regular rhythm, normal heart sounds and intact distal pulses.  Exam reveals no gallop and no friction rub.   No murmur heard. Pulmonary/Chest: She is in respiratory distress.  Essentially no lung sounds on the right, slight rales on the left but good air movement on the left  Abdominal: Soft. Bowel sounds are normal. She exhibits no distension and no mass. There is  no tenderness.  Musculoskeletal: She exhibits edema (severe symmetrical pitting edema). She exhibits no tenderness.  Lymphadenopathy:    She has no cervical adenopathy.  Neurological:  Somnolent, arousable to voice, does not follow commands, very quickly decompensate back to her obtunded state  Skin: Skin is warm and dry.    ED Course  Procedures (including critical care time) Labs Review Labs Reviewed  CBC WITH DIFFERENTIAL - Abnormal; Notable for the following:    RBC 3.38 (*)    Hemoglobin 10.7 (*)    HCT 35.0 (*)    MCV 103.6 (*)    RDW 17.0 (*)    Neutrophils Relative % 87 (*)    Lymphocytes Relative 8 (*)    Lymphs Abs 0.4 (*)    All other components within normal limits  COMPREHENSIVE METABOLIC PANEL - Abnormal; Notable for the following:    Potassium 6.8 (*)    Glucose, Bld 135 (*)    BUN 83 (*)    Creatinine, Ser 2.07 (*)    GFR calc non Af Amer 21 (*)    GFR calc Af Amer 25 (*)    All other components within normal limits  PRO B NATRIURETIC PEPTIDE - Abnormal; Notable for the following:    Pro B Natriuretic peptide (BNP) 6196.0 (*)    All other components within normal limits  URINALYSIS, ROUTINE W REFLEX MICROSCOPIC - Abnormal; Notable for the following:    APPearance  CLOUDY (*)    Hgb urine dipstick TRACE (*)    Protein, ur >300 (*)    Leukocytes, UA MODERATE (*)    All other components within normal limits  POTASSIUM - Abnormal; Notable for the following:    Potassium 2.6 (*)    All other components within normal limits  URINE MICROSCOPIC-ADD ON - Abnormal; Notable for the following:    Bacteria, UA MANY (*)    Casts HYALINE CASTS (*)    All other components within normal limits  TROPONIN I    Imaging Review Ct Head Wo Contrast  12/05/13   CLINICAL DATA:  Severe headache  EXAM: CT HEAD WITHOUT CONTRAST  TECHNIQUE: Contiguous axial images were obtained from the base of the skull through the vertex without intravenous contrast.  COMPARISON:  Prior CT from 05/10/2013  FINDINGS: Study is degraded by motion artifact.  Generalized cerebral atrophy with advanced chronic microvascular ischemic changes again noted, stable from prior. Remote lacunar infarct within the left basal ganglia is also stable.  No acute large vessel territory infarct or intracranial hemorrhage identified. No mass lesion or midline shift. No hydrocephalus. There is no extra-axial fluid collection.  Calvarium is intact. Scalp soft tissues within normal limits. No acute abnormality seen about the orbits.  Paranasal sinuses are clear. Mild scattered opacity noted within the mastoid air cells bilaterally, left worse than right.  IMPRESSION: 1. No acute intracranial abnormality. 2. Generalized cerebral atrophy with chronic microvascular ischemic disease, unchanged.   Electronically Signed   By: Jeannine Boga M.D.   On: 12/05/2013 03:07   Dg Chest Port 1 View  12-05-13   CLINICAL DATA:  Shortness of breath  EXAM: PORTABLE CHEST - 1 VIEW  COMPARISON:  07/27/2013  FINDINGS: Dema Severin out of the right chest, with no visible aerated lung. Evaluation for volume loss or expansion is limited due to rightward rotation. The appearance of the trachea and heart favors volume gain, usually from pleural  effusion. A pleural effusion was noted on the right earlier this year. There is pulmonary  venous congestion notable on the left. No pneumothorax.  IMPRESSION: No aerated lung on the right, likely from large right pleural effusion. Evaluation is limited by patient rotation, suggest repeat chest x-ray or CT prior to therapeutic intervention.   Electronically Signed   By: Jorje Guild M.D.   On: 12/10/2013 02:45     EKG Interpretation   Date/Time:  Thursday November 29 2013 05:22:46 EDT Ventricular Rate:  98 PR Interval:    QRS Duration: 146 QT Interval:  407 QTC Calculation: 520 R Axis:   -136 Text Interpretation:  Atrial fibrillation Right bundle branch block  Non-specific ST-t changes Abnormal ekg Since last tracing rate slower  Confirmed by Damonie Ellenwood  MD, Breylin Dom ([id number]) on 12/15/2013 6:06:40 AM      MDM   Final diagnoses:  Pleural effusion, right  UTI (lower urinary tract infection)  Hypokalemia    The patient is in severe distress, appears to be ovarian origin, she has JVD to the angle of the jaw, severe fluid retention, significant altered mental status of unknown origin, labs, chest x-ray, CT scan of the head. The patient has been given nonrebreather for oxygenation which at this time is adequate. Family aware of the patient's status according to EMS and nursing home   Labs show on repeat that the K is actually low - given the initial information and the life threatening nature of hyperK, the initial meds were warranted - at this time the pt will get IV K, I have discussed with the family the next move and the utility of pleural drainage / abx - they will d/w the other next of kin to make decision about what to do next.  Pt reexamined - and still is obtunded, awakens to loud voice and painful stimuli, does not follow commands.  Other labs show UTI and acute on chronic renal failure.  D/w family - now states they want comfort measures.  D/w Dr. Arnoldo Morale who will admit - comfort care,  med surg  Johnna Acosta, MD 12/21/2013 [phone]

## 2013-11-29 NOTE — ED Notes (Signed)
Call report to [phone]

## 2013-11-29 NOTE — Progress Notes (Signed)
Chaplain responded to spiritual care consult. Pt's daughter-in-law was in the room and asked chaplain to pray and speak with pt. Pt was responsive to chaplain, daughter-in-law said it was the first time she had responded all day. Chaplain was present during medical update from pt's physician. Provided emotional support to pt and family through empathic listening, pastoral presence, and prayer. Family aware of chaplain services and availability. Please page for follow up.   Ethelene Browns [phone]

## 2013-11-29 NOTE — Progress Notes (Signed)
Texted paged the admission MD to see who the attending was and to inform them of admission.  Will await for return call.  Alphonzo Lemmings, RN

## 2013-11-29 NOTE — ED Notes (Signed)
Family at bedside. 

## 2013-11-29 NOTE — ED Notes (Signed)
I called the patient's son and asked if he was on his way to the hospital because [facility] said he was on his way.  He advised me he was out of town and would call his wife.

## 2013-11-29 NOTE — Progress Notes (Signed)
Attempted to call ED for report - nurse unable to speak at this time - took number and will call back when available.

## 2013-11-29 NOTE — Progress Notes (Addendum)
[facility] PROGRESS NOTE  [name] BMW:[id number] DOB: 1931-10-03 DOA: 11/26/2013 PCP: Cathlean Cower, MD  Assessment/Plan  Acute Respiratory Failure- Due to large Right Pleural Effusionand Pulmonary Vascular congestion on Left from acute on chronic diastolic heart failure -  Family does not wish to pursue Thoracentesis or BIPAP for therapy -  Palliative care consult pending -  Increase to lasix 40mg  IV BID as blood pressure tolerates may increase -  Strict I/O -  Daily weights -  Wean oxygen as tolerated -  Add morphine 1mg  IV q3h prn SOB  Anasarca/Failure to Thrive and Hypoalbuminemia - currently NPO due to mentation  UTI -  F/u urine culture -  Continue ceftriaxone day 1  Acute Encephalopathy- due to #1,   A-fib, rate controlled -  Not on A/C due to comorbidities, falls risk and cannot tolerate PO medications  Hypotension, resolving and may continue to improve with additional diuresis and abx -  Hold BP medications    DM- SSI Coverage PRN.   Hyperkalemia, no peaked T-waves on ECG and not able to tolerate oral kayexalate -  Continue lasix as tolerated -  Pt is s/p calcium, sodium bicarb  Chronic Atrial Fibrillation- Rate controlled.   Sacral Ulcer Stage I- Wound Care PRN.   Venous Stasis Ulcers of BLE- Has Unna Boots on both Lower Extremities.    Diet:  NPO pending improvement in mentation Access:  PIV IVF:  off Proph:  lovenox  Code Status: DNR Family Communication: patient and family Disposition Plan: patient critically ill, awaiting palliative care consultation   Consultants:  Palliative care  Procedures:  CT head  CXR  Antibiotics:  Ceftriaxone from 6/4 >>   HPI/Subjective:  Patient lethargic and confused, unable to contribute to history  Objective: Filed Vitals:   11/28/2013 0715 12/01/2013 0832 11/26/2013 0837 12/07/2013 1149  BP:  126/87    Pulse:    85  Temp:      TempSrc:      Resp: 12   12  Height:   5' 4.96" (1.65 m)   Weight:    80.7 kg (177 lb 14.6 oz)   SpO2: 100%   97%   No intake or output data in the 24 hours ending 12/09/2013 1346 Filed Weights   12/04/2013 0837  Weight: 80.7 kg (177 lb 14.6 oz)    Exam:   General:  AAF, awake but not oriented and not responding appropriately to questions or commands, SCM and subcostal retractions, mild tachypnea  HEENT:  NCAT, MMM  Cardiovascular:  IRRR, nl S1, S2 no mrg, 2+ pulses, warm extremities  Respiratory:  Diminished breath sounds to apex on right side and rhonchorous on left with diminished at base with rales  Abdomen:   NABS, soft, NT/ND  MSK:   Normal tone and bulk, unna boots, 2+ bilateral lower extremity edema  Neuro:  Contracture RUE, does not voluntarily move bilateral lower extremities, moves fingers on left hand, slumped to right side of bed.  Data Reviewed: Basic Metabolic Panel:  Recent Labs Lab 12/23/2013 0242 12/25/2013 0502 12/21/2013 0925  NA 146  --  148*  K 6.8* 2.6* 6.7*  CL 106  --  107  CO2 28  --  29  GLUCOSE 135*  --  114*  BUN 83*  --  83*  CREATININE 2.07*  --  2.05*  CALCIUM 9.9  --  10.1   Liver Function Tests:  Recent Labs Lab 12/17/2013 0242  AST 21  ALT 15  ALKPHOS  68  BILITOT 0.5  PROT 6.6  ALBUMIN 3.6   No results found for this basename: LIPASE, AMYLASE,  in the last 168 hours No results found for this basename: AMMONIA,  in the last 168 hours CBC:  Recent Labs Lab 12/21/2013 0242  WBC 5.4  NEUTROABS 4.7  HGB 10.7*  HCT 35.0*  MCV 103.6*  PLT 151   Cardiac Enzymes:  Recent Labs Lab 12/03/2013 0242  TROPONINI <0.30   BNP (last 3 results)  Recent Labs  07/21/13 0415 08/08/13 1245 11/26/2013 0242  PROBNP 4244.0* 7263.0* 6196.0*   CBG:  Recent Labs Lab 12/06/2013 0843 12/23/2013 1202  GLUCAP 98 100*    Recent Results (from the past 240 hour(s))  MRSA PCR SCREENING     Status: None   Collection Time    12/03/2013  8:48 AM      Result Value Ref Range Status   MRSA by PCR NEGATIVE  NEGATIVE  Final   Comment:            The GeneXpert MRSA Assay (FDA     approved for NASAL specimens     only), is one component of a     comprehensive MRSA colonization     surveillance program. It is not     intended to diagnose MRSA     infection nor to guide or     monitor treatment for     MRSA infections.     Studies: Ct Head Wo Contrast  12/16/2013   CLINICAL DATA:  Severe headache  EXAM: CT HEAD WITHOUT CONTRAST  TECHNIQUE: Contiguous axial images were obtained from the base of the skull through the vertex without intravenous contrast.  COMPARISON:  Prior CT from 05/10/2013  FINDINGS: Study is degraded by motion artifact.  Generalized cerebral atrophy with advanced chronic microvascular ischemic changes again noted, stable from prior. Remote lacunar infarct within the left basal ganglia is also stable.  No acute large vessel territory infarct or intracranial hemorrhage identified. No mass lesion or midline shift. No hydrocephalus. There is no extra-axial fluid collection.  Calvarium is intact. Scalp soft tissues within normal limits. No acute abnormality seen about the orbits.  Paranasal sinuses are clear. Mild scattered opacity noted within the mastoid air cells bilaterally, left worse than right.  IMPRESSION: 1. No acute intracranial abnormality. 2. Generalized cerebral atrophy with chronic microvascular ischemic disease, unchanged.   Electronically Signed   By: Jeannine Boga M.D.   On: 12/16/2013 03:07   Dg Chest Port 1 View  12/16/2013   CLINICAL DATA:  Shortness of breath  EXAM: PORTABLE CHEST - 1 VIEW  COMPARISON:  07/27/2013  FINDINGS: Dema Severin out of the right chest, with no visible aerated lung. Evaluation for volume loss or expansion is limited due to rightward rotation. The appearance of the trachea and heart favors volume gain, usually from pleural effusion. A pleural effusion was noted on the right earlier this year. There is pulmonary venous congestion notable on the left. No  pneumothorax.  IMPRESSION: No aerated lung on the right, likely from large right pleural effusion. Evaluation is limited by patient rotation, suggest repeat chest x-ray or CT prior to therapeutic intervention.   Electronically Signed   By: Jorje Guild M.D.   On: 12/19/2013 02:45    Scheduled Meds: . [START ON 11/30/2013] cefTRIAXone (ROCEPHIN)  IV  1 g Intravenous Q24H  . dextrose  1 ampule Intravenous Once  . enoxaparin (LOVENOX) injection  30 mg Subcutaneous Q24H  . furosemide  40 mg Intravenous BID  . insulin aspart  0-9 Units Subcutaneous 6 times per day  . sodium chloride  3 mL Intravenous Q12H   Continuous Infusions:   Active Problems:   Chronic atrial fibrillation   Acute on chronic diastolic heart failure- EF 60-65% 11/14   UTI (lower urinary tract infection)   Failure to thrive- SNF pt   Venous stasis ulcers   Diabetes mellitus with diabetic nephropathy   Anasarca   Hypokalemia   Chronic kidney disease, stage III (moderate)   Pleural effusion   Acute respiratory failure   Acute encephalopathy   Ulcer of sacral region, stage 1   Hypotension    Time spent: 30 min    [facility] Pager [phone]. If 7PM-7AM, please contact night-coverage at [email or web address], password [facility] Dec 24, 2013, 1:46 PM  LOS: 0 days

## 2013-11-30 ENCOUNTER — Observation Stay (HOSPITAL_COMMUNITY): Payer: Medicare Other

## 2013-11-30 DIAGNOSIS — R1013 Epigastric pain: Secondary | ICD-10-CM

## 2013-11-30 DIAGNOSIS — E875 Hyperkalemia: Secondary | ICD-10-CM

## 2013-11-30 LAB — BASIC METABOLIC PANEL
BUN: 84 mg/dL — AB (ref 6–23)
CHLORIDE: 105 meq/L (ref 96–112)
CO2: 27 mEq/L (ref 19–32)
Calcium: 10.2 mg/dL (ref 8.4–10.5)
Creatinine, Ser: 2.09 mg/dL — ABNORMAL HIGH (ref 0.50–1.10)
GFR calc non Af Amer: 21 mL/min — ABNORMAL LOW (ref >90)
GFR, EST AFRICAN AMERICAN: 25 mL/min — AB (ref >90)
Glucose, Bld: 84 mg/dL (ref 70–99)
POTASSIUM: 6.7 meq/L — AB (ref 3.7–5.3)
SODIUM: 145 meq/L (ref 137–147)

## 2013-11-30 LAB — HEPATIC FUNCTION PANEL
ALBUMIN: 3.6 g/dL (ref 3.5–5.2)
ALK PHOS: 62 U/L (ref 39–117)
ALT: 13 U/L (ref 0–35)
AST: 22 U/L (ref 0–37)
Bilirubin, Direct: <0.2 mg/dL (ref 0.0–0.3)
Total Bilirubin: 0.5 mg/dL (ref 0.3–1.2)
Total Protein: 6.5 g/dL (ref 6.0–8.3)

## 2013-11-30 LAB — CBC
HEMATOCRIT: 35.4 % — AB (ref 36.0–46.0)
HEMOGLOBIN: 10.5 g/dL — AB (ref 12.0–15.0)
MCH: 31 pg (ref 26.0–34.0)
MCHC: 29.7 g/dL — ABNORMAL LOW (ref 30.0–36.0)
MCV: 104.4 fL — ABNORMAL HIGH (ref 78.0–100.0)
PLATELETS: 161 10*3/uL (ref 150–400)
RBC: 3.39 MIL/uL — AB (ref 3.87–5.11)
RDW: 17.3 % — AB (ref 11.5–15.5)
WBC: 4.5 10*3/uL (ref 4.0–10.5)

## 2013-11-30 LAB — GLUCOSE, CAPILLARY
Glucose-Capillary: 117 mg/dL — ABNORMAL HIGH (ref 70–99)
Glucose-Capillary: 142 mg/dL — ABNORMAL HIGH (ref 70–99)
Glucose-Capillary: 80 mg/dL (ref 70–99)
Glucose-Capillary: 82 mg/dL (ref 70–99)
Glucose-Capillary: 94 mg/dL (ref 70–99)

## 2013-11-30 LAB — LACTIC ACID, PLASMA: Lactic Acid, Venous: 1.1 mmol/L (ref 0.5–2.2)

## 2013-11-30 LAB — LIPASE, BLOOD: Lipase: 11 U/L (ref 11–59)

## 2013-11-30 MED ORDER — LEVOTHYROXINE SODIUM 25 MCG PO TABS
25.0000 ug | ORAL_TABLET | Freq: Every day | ORAL | Status: DC
  Filled 2013-11-30: qty 1

## 2013-11-30 MED ORDER — DOCUSATE SODIUM 100 MG PO CAPS
100.0000 mg | ORAL_CAPSULE | Freq: Two times a day (BID) | ORAL | Status: DC
  Administered 2013-11-30: 100 mg via ORAL
  Filled 2013-11-30 (×5): qty 1

## 2013-11-30 MED ORDER — SODIUM POLYSTYRENE SULFONATE 15 GM/60ML PO SUSP
45.0000 g | Freq: Once | ORAL | Status: AC
  Administered 2013-11-30: 45 g via ORAL
  Filled 2013-11-30: qty 180

## 2013-11-30 MED ORDER — LORAZEPAM 2 MG/ML IJ SOLN: 1.0000 mg | INTRAMUSCULAR | Status: DC | PRN

## 2013-11-30 MED ORDER — OXYCODONE HCL 5 MG/5ML PO SOLN
5.0000 mg | Freq: Three times a day (TID) | ORAL | Status: DC
  Administered 2013-11-30 – 2013-12-01 (×2): 5 mg via ORAL
  Filled 2013-11-30 (×2): qty 5

## 2013-11-30 MED ORDER — OXYCODONE HCL 20 MG/ML PO CONC: 5.0000 mg | Freq: Three times a day (TID) | ORAL | Status: DC

## 2013-11-30 MED ORDER — DILTIAZEM HCL ER COATED BEADS 120 MG PO CP24
120.0000 mg | ORAL_CAPSULE | Freq: Every day | ORAL | Status: DC
  Administered 2013-11-30: 120 mg via ORAL
  Filled 2013-11-30: qty 1

## 2013-11-30 MED ORDER — CITALOPRAM HYDROBROMIDE 20 MG PO TABS
20.0000 mg | ORAL_TABLET | Freq: Every day | ORAL | Status: DC
  Administered 2013-11-30: 20 mg via ORAL
  Filled 2013-11-30: qty 1

## 2013-11-30 MED ORDER — MORPHINE SULFATE 2 MG/ML IJ SOLN
1.0000 mg | INTRAMUSCULAR | Status: DC | PRN
  Administered 2013-11-30 (×2): 2 mg via INTRAVENOUS
  Filled 2013-11-30 (×2): qty 1

## 2013-11-30 MED ORDER — SODIUM CHLORIDE 0.9 % IV SOLN: 2.0000 g | Freq: Once | INTRAVENOUS | Status: DC

## 2013-11-30 MED ORDER — MORPHINE SULFATE 2 MG/ML IJ SOLN: 2.0000 mg | INTRAMUSCULAR | Status: DC | PRN

## 2013-11-30 MED ORDER — FUROSEMIDE 10 MG/ML IJ SOLN
80.0000 mg | Freq: Two times a day (BID) | INTRAMUSCULAR | Status: DC
  Administered 2013-12-01 (×2): 80 mg via INTRAVENOUS
  Filled 2013-11-30 (×5): qty 8

## 2013-11-30 MED ORDER — LORAZEPAM 2 MG/ML IJ SOLN
0.5000 mg | Freq: Two times a day (BID) | INTRAMUSCULAR | Status: DC
  Administered 2013-11-30 – 2013-12-01 (×2): 0.5 mg via INTRAVENOUS
  Filled 2013-11-30 (×2): qty 1

## 2013-11-30 MED ORDER — MORPHINE SULFATE 2 MG/ML IJ SOLN
2.0000 mg | INTRAMUSCULAR | Status: DC | PRN
  Administered 2013-12-01 (×2): 4 mg via INTRAVENOUS
  Administered 2013-12-01 (×3): 2 mg via INTRAVENOUS
  Administered 2013-12-02 (×2): 4 mg via INTRAVENOUS
  Filled 2013-11-30: qty 2
  Filled 2013-11-30 (×4): qty 1
  Filled 2013-11-30 (×3): qty 2

## 2013-11-30 MED ORDER — FUROSEMIDE 10 MG/ML IJ SOLN
80.0000 mg | Freq: Three times a day (TID) | INTRAMUSCULAR | Status: DC
  Administered 2013-11-30: 80 mg via INTRAVENOUS

## 2013-11-30 MED ORDER — CALCIUM GLUCONATE 10 % IV SOLN
1.0000 g | Freq: Once | INTRAVENOUS | Status: DC
  Filled 2013-11-30: qty 10

## 2013-11-30 MED ORDER — ALPRAZOLAM 0.25 MG PO TABS
0.2500 mg | ORAL_TABLET | Freq: Two times a day (BID) | ORAL | Status: DC
  Administered 2013-11-30: 0.25 mg via ORAL
  Filled 2013-11-30: qty 1

## 2013-11-30 MED ORDER — HYDRALAZINE HCL 20 MG/ML IJ SOLN
10.0000 mg | Freq: Once | INTRAMUSCULAR | Status: AC
  Administered 2013-11-30: 10 mg via INTRAVENOUS
  Filled 2013-11-30: qty 0.5

## 2013-11-30 NOTE — Progress Notes (Signed)
Palliative consultation received. Patient seen and appears comfortable -will schedule time to meet with family at earliest possible time we have a provider available.  Lane Hacker, DO Palliative Medicine'

## 2013-11-30 NOTE — Progress Notes (Addendum)
[facility] PROGRESS NOTE  [name] HEN:[id number] DOB: 01/24/1932 DOA: 12/12/2013 PCP: Cathlean Cower, MD  Assessment/Plan  Acute Respiratory Failure- Due to large Right Pleural Effusion and Pulmonary Vascular congestion on Left from acute on chronic diastolic heart failure -  Family does not wish to pursue Thoracentesis or BIPAP for therapy -  Palliative care consult pending -  Increase to lasix 40mg  IV BID as blood pressure tolerates may increase -  I/O not recorded.  Not awake enough to eat/drink -  Daily weights -  Wean oxygen as tolerated -  Cont morphine prn  Abdominal pain  -  LFTs and lipase, lactic acid -  KUB -  INcrease morphine -  Use suppository for abdominal pain -  Would consider paracentesis, however, family would like to minimize procedures -  Place foley  Anasarca/Failure to Thrive and Hypoalbuminemia - advance diet for comfort  UTI -  Urine culture with mixed flora -  Continue ceftriaxone day 2  Acute Encephalopathy- due to #1,  - advance diet for comfort  A-fib, rate controlled, okay to d/c telemetry -  Not on A/C due to comorbidities, falls risk and cannot tolerate PO medications  Hypotension, resolving and may continue to improve with additional diuresis and abx -  Hold BP medications    DM- SSI Coverage PRN.   Hyperkalemia, no peaked T-waves on ECG and not able to tolerate oral kayexalate -  Increase lasix -  Pt is s/p calcium, sodium bicarb  Chronic Atrial Fibrillation- Rate controlled.   Sacral Ulcer Stage I- Wound Care PRN.   Venous Stasis Ulcers of BLE- Has Unna Boots on both Lower Extremities.    Diet:  Dysphagia 2  Access:  PIV IVF:  off Proph:  lovenox  Code Status: DNR Family Communication: patient and son  Disposition Plan: patient critically ill, awaiting palliative care consultation   Consultants:  Palliative care  Procedures:  CT head  CXR  Antibiotics:  Ceftriaxone from 6/4 >>    HPI/Subjective:  Patient more awake today and crying out in pain  Objective: Filed Vitals:   12/13/2013 2202 11/30/13 0230 11/30/13 0500 11/30/13 0652  BP: 129/69 125/69 176/82 153/76  Pulse: 89 90 61   Temp: 98.1 F (36.7 C) 98 F (36.7 C) 98.5 F (36.9 C)   TempSrc: Oral Oral Oral   Resp: 11 12 12    Height:      Weight:      SpO2: 100% 100% 100%     Intake/Output Summary (Last 24 hours) at 11/30/13 0757 Last data filed at 12/22/2013 2258  Gross per 24 hour  Intake      0 ml  Output      0 ml  Net      0 ml   Filed Weights   12/17/2013 0837  Weight: 80.7 kg (177 lb 14.6 oz)    Exam:   General:  AAF, calling out in pain  HEENT:  NCAT, MMM  Cardiovascular:  IRRR, nl S1, S2 no mrg, 2+ pulses, warm extremities  Respiratory:  Diminished breath sounds to apex on right side and rhonchorous on left with diminished at base with rales  Abdomen:   NABS, soft, + hepatomegaly and edema, TTP throughout without rebound or guarding  MSK:   Normal tone and bulk, unna boots, 2+ bilateral lower extremity edema  Neuro:  Contracture RUE, does not voluntarily move bilateral lower extremities, moves fingers on left hand, slumped to right side of bed.  Data Reviewed: Basic  Metabolic Panel:  Recent Labs Lab 12/01/2013 0242 11/28/2013 0502 12/06/2013 0925 11/30/13 0640  NA 146  --  148* 145  K 6.8* 2.6* 6.7* 6.7*  CL 106  --  107 105  CO2 28  --  29 27  GLUCOSE 135*  --  114* 84  BUN 83*  --  83* 84*  CREATININE 2.07*  --  2.05* 2.09*  CALCIUM 9.9  --  10.1 10.2   Liver Function Tests:  Recent Labs Lab 12/03/2013 0242  AST 21  ALT 15  ALKPHOS 68  BILITOT 0.5  PROT 6.6  ALBUMIN 3.6   No results found for this basename: LIPASE, AMYLASE,  in the last 168 hours No results found for this basename: AMMONIA,  in the last 168 hours CBC:  Recent Labs Lab 12/05/2013 0242 11/30/13 0640  WBC 5.4 4.5  NEUTROABS 4.7  --   HGB 10.7* 10.5*  HCT 35.0* 35.4*  MCV 103.6* 104.4*   PLT 151 161   Cardiac Enzymes:  Recent Labs Lab 11/28/2013 0242  TROPONINI <0.30   BNP (last 3 results)  Recent Labs  07/21/13 0415 08/08/13 1245 12/15/2013 0242  PROBNP 4244.0* 7263.0* 6196.0*   CBG:  Recent Labs Lab 12/13/2013 1202 11/28/2013 1655 11/27/2013 2053 11/30/13 0028 11/30/13 0514  GLUCAP 100* 98 101* 82 80    Recent Results (from the past 240 hour(s))  MRSA PCR SCREENING     Status: None   Collection Time    12/21/2013  8:48 AM      Result Value Ref Range Status   MRSA by PCR NEGATIVE  NEGATIVE Final   Comment:            The GeneXpert MRSA Assay (FDA     approved for NASAL specimens     only), is one component of a     comprehensive MRSA colonization     surveillance program. It is not     intended to diagnose MRSA     infection nor to guide or     monitor treatment for     MRSA infections.     Studies: Ct Head Wo Contrast  12/21/2013   CLINICAL DATA:  Severe headache  EXAM: CT HEAD WITHOUT CONTRAST  TECHNIQUE: Contiguous axial images were obtained from the base of the skull through the vertex without intravenous contrast.  COMPARISON:  Prior CT from 05/10/2013  FINDINGS: Study is degraded by motion artifact.  Generalized cerebral atrophy with advanced chronic microvascular ischemic changes again noted, stable from prior. Remote lacunar infarct within the left basal ganglia is also stable.  No acute large vessel territory infarct or intracranial hemorrhage identified. No mass lesion or midline shift. No hydrocephalus. There is no extra-axial fluid collection.  Calvarium is intact. Scalp soft tissues within normal limits. No acute abnormality seen about the orbits.  Paranasal sinuses are clear. Mild scattered opacity noted within the mastoid air cells bilaterally, left worse than right.  IMPRESSION: 1. No acute intracranial abnormality. 2. Generalized cerebral atrophy with chronic microvascular ischemic disease, unchanged.   Electronically Signed   By: Jeannine Boga M.D.   On: 11/27/2013 03:07   Dg Chest Port 1 View  12/11/2013   CLINICAL DATA:  Shortness of breath  EXAM: PORTABLE CHEST - 1 VIEW  COMPARISON:  07/27/2013  FINDINGS: Dema Severin out of the right chest, with no visible aerated lung. Evaluation for volume loss or expansion is limited due to rightward rotation. The appearance of the trachea and heart  favors volume gain, usually from pleural effusion. A pleural effusion was noted on the right earlier this year. There is pulmonary venous congestion notable on the left. No pneumothorax.  IMPRESSION: No aerated lung on the right, likely from large right pleural effusion. Evaluation is limited by patient rotation, suggest repeat chest x-ray or CT prior to therapeutic intervention.   Electronically Signed   By: Jorje Guild M.D.   On: 12/23/2013 02:45    Scheduled Meds: . cefTRIAXone (ROCEPHIN)  IV  1 g Intravenous Q24H  . dextrose  1 ampule Intravenous Once  . enoxaparin (LOVENOX) injection  30 mg Subcutaneous Q24H  . furosemide  40 mg Intravenous BID  . insulin aspart  0-9 Units Subcutaneous 6 times per day  . sodium chloride  3 mL Intravenous Q12H   Continuous Infusions:   Active Problems:   Chronic atrial fibrillation   Acute on chronic diastolic heart failure- EF 60-65% 11/14   UTI (lower urinary tract infection)   Failure to thrive- SNF pt   Venous stasis ulcers   Diabetes mellitus with diabetic nephropathy   Anasarca   Hypokalemia   Chronic kidney disease, stage III (moderate)   Pleural effusion   Acute respiratory failure   Acute encephalopathy   Ulcer of sacral region, stage 1   Hypotension    Time spent: 30 min    [facility] Pager [phone]. If 7PM-7AM, please contact night-coverage at [email or web address], password [facility] 11/30/2013, 7:57 AM  LOS: 1 day

## 2013-11-30 NOTE — Progress Notes (Signed)
CRITICAL VALUE ALERT  Critical value received:  Potassium 6.7  Date of notification:  11/30/13  Time of notification:  0756  Critical value read back:yes  Nurse who received alert:  Zenon Mayo, RN  MD notified (1st page):  Dr. Sheran Fava  Time of first page:  [phone]  Responding MD:  Dr. Sheran Fava  Time MD responded:  0800

## 2013-11-30 NOTE — Progress Notes (Signed)
Nutrition Brief Note  Pt seen for Low Braden score. Per MD note, PO for comfort. Pt not awake enough to eat. Palliative care consult pending.    Wt Readings from Last 15 Encounters:  12/17/2013 177 lb 14.6 oz (80.7 kg)  11/28/13 178 lb (80.74 kg)  11/07/13 176 lb (79.833 kg)  10/24/13 176 lb (79.833 kg)  09/25/13 174 lb (78.926 kg)  08/01/13 190 lb 0.6 oz (86.2 kg)  06/26/13 183 lb (83.008 kg)  05/14/13 186 lb 11.7 oz (84.7 kg)  04/25/13 184 lb 1.4 oz (83.5 kg)  04/05/13 178 lb (80.74 kg)  02/13/13 189 lb 2 oz (85.787 kg)  01/27/13 189 lb 2.5 oz (85.8 kg)  11/28/12 163 lb (73.936 kg)  07/13/12 163 lb (73.936 kg)  03/23/12 160 lb 6.4 oz (72.757 kg)    Body mass index is 29.64 kg/(m^2). Patient meets criteria for overweight based on current BMI.   Current diet order is Dysphagia 1/Thin, patient is consuming approximately 0% of meals at this time. Labs and medications reviewed.   No nutrition interventions warranted at this time. If nutrition issues arise, please consult RD.   [place], [place], [place] Pager [phone] After Hours Pager

## 2013-11-30 NOTE — Progress Notes (Signed)
Utilization review completed.  

## 2013-11-30 NOTE — Consult Note (Addendum)
I have spoken with patient's HCPOA Venetia Constable. Goals are full comfort, Residential hospice placement and aggressive symptom management. Langley Gauss will be here on Sunday to assist with her mothers care. Full consult note to follow. I have discontinued all non-essential medications.  Lane Hacker, DO Palliative Medicine

## 2013-12-01 LAB — GLUCOSE, CAPILLARY
GLUCOSE-CAPILLARY: 126 mg/dL — AB (ref 70–99)
GLUCOSE-CAPILLARY: 142 mg/dL — AB (ref 70–99)
Glucose-Capillary: 147 mg/dL — ABNORMAL HIGH (ref 70–99)

## 2013-12-01 NOTE — Progress Notes (Signed)
Patient has orders for q4 vitals- RN spoke with POA. She states that if patient is awake vitals may be done but if patient is asleep or tired she would like her to be left alone. Patient is comfort care- to be monitored

## 2013-12-01 NOTE — Progress Notes (Signed)
[facility] PROGRESS NOTE  [name] VQQ:[id number] DOB: 1932/02/27 DOA: 12/07/2013 PCP: Cathlean Cower, MD  Assessment/Plan  Acute Respiratory Failure- Due to large Right Pleural Effusion and Pulmonary Vascular congestion on Left from acute on chronic diastolic heart failure.  She was initially diuresed with IV lasix, however, she continued to have respiratory distress and was started on as needed morphine.  Palliative care discussed goals of care with the HPOA and family and they decided to transition to full comfort measures.  Having some upper airway noises this morning.   -  Family does not wish to pursue Thoracentesis or BIPAP for therapy -  Appreciate palliative care assistance  -  Continue lasix 80mg  IV BID -  Continue prn morphine  Abdominal pain, LFTs and lipase and lactic acid were all normal.  KUB demonstrated no evidence of obstruction or ileus.  -  Continue prn morphine.   -  Continue colace, senna, bisacodyl prn -  Continue foley for comfort  Anasarca/Failure to Thrive and Hypoalbuminemia - advanced diet for comfort  UTI, Urine culture with mixed flora.  abx discontinued.  Acute Encephalopathy- due to heart failure, effusion superimposed on dementia.  Mentation improved transiently.   -  Await inpatient hospice  A-fib, tachycardic.  GOC comfort only.    Hypotension, intermittently.   -  Continue diuresis.  -  Hold BP medications    DM, comfort measures only.  Okay to d/c CBG.    Hyperkalemia, no further labs.  Comfort measures only  Sacral Ulcer Stage I- Wound Care PRN.   Venous Stasis Ulcers of BLE- Has Unna Boots on both Lower Extremities.   Diet:  Dysphagia 1 Access:  PIV IVF:  off Proph:  lovenox  Code Status: DNR Family Communication: patient and son  Disposition Plan: patient critically ill, awaiting palliative care consultation   Consultants:  Palliative care  Procedures:  CT head  CXR  Antibiotics:  Ceftriaxone from 6/4 >>  6/5  HPI/Subjective:  Patient asleep, confused, unable to answer questions.    Objective: Filed Vitals:   12/01/13 0123 12/01/13 0430 12/01/13 0627 12/01/13 1201  BP: 160/77 64/32 103/73 120/86  Pulse: 90 140  133  Temp:  98.3 F (36.8 C)  98.5 F (36.9 C)  TempSrc:  Axillary  Axillary  Resp: 25 23  16   Height:      Weight:      SpO2: 95% 92%  94%    Intake/Output Summary (Last 24 hours) at 12/01/13 1233 Last data filed at 12/01/13 1039  Gross per 24 hour  Intake    103 ml  Output    900 ml  Net   -797 ml   Filed Weights   11/30/2013 0837  Weight: 80.7 kg (177 lb 14.6 oz)    Exam:   General:  AAF, more comfortable, rhonchorous BS  HEENT:  NCAT, MMM  Cardiovascular:  IRRR, nl S1, S2 no mrg, 2+ pulses, warm extremities  Respiratory:  Diminished breath sounds to apex on right side and rhonchorous throughout  Abdomen:   NABS, soft, ND/NT, abdominal wall edema   MSK:   Normal tone and bulk, unna boots, 2+ bilateral lower extremity edema  Neuro:  Contracture RUE with edema, does not voluntarily move bilateral lower extremities, moves fingers on left hand, slumped to right side of bed.  Data Reviewed: Basic Metabolic Panel:  Recent Labs Lab 12/18/2013 0242 12/08/2013 0502 12/23/2013 0925 11/30/13 0640  NA 146  --  148* 145  K 6.8*  2.6* 6.7* 6.7*  CL 106  --  107 105  CO2 28  --  29 27  GLUCOSE 135*  --  114* 84  BUN 83*  --  83* 84*  CREATININE 2.07*  --  2.05* 2.09*  CALCIUM 9.9  --  10.1 10.2   Liver Function Tests:  Recent Labs Lab 12/22/2013 0242 11/30/13 0640  AST 21 22  ALT 15 13  ALKPHOS 68 62  BILITOT 0.5 0.5  PROT 6.6 6.5  ALBUMIN 3.6 3.6    Recent Labs Lab 11/30/13 0640  LIPASE 11   No results found for this basename: AMMONIA,  in the last 168 hours CBC:  Recent Labs Lab 11/30/2013 0242 11/30/13 0640  WBC 5.4 4.5  NEUTROABS 4.7  --   HGB 10.7* 10.5*  HCT 35.0* 35.4*  MCV 103.6* 104.4*  PLT 151 161   Cardiac Enzymes:  Recent  Labs Lab 12/25/2013 0242  TROPONINI <0.30   BNP (last 3 results)  Recent Labs  07/21/13 0415 08/08/13 1245 11/30/2013 0242  PROBNP 4244.0* 7263.0* 6196.0*   CBG:  Recent Labs Lab 11/30/13 1201 11/30/13 2018 12/01/13 0009 12/01/13 0427 12/01/13 0803  GLUCAP 142* 117* 142* 147* 126*    Recent Results (from the past 240 hour(s))  MRSA PCR SCREENING     Status: None   Collection Time    12/05/2013  8:48 AM      Result Value Ref Range Status   MRSA by PCR NEGATIVE  NEGATIVE Final   Comment:            The GeneXpert MRSA Assay (FDA     approved for NASAL specimens     only), is one component of a     comprehensive MRSA colonization     surveillance program. It is not     intended to diagnose MRSA     infection nor to guide or     monitor treatment for     MRSA infections.     Studies: Dg Abd Portable 1v  11/30/2013   CLINICAL DATA:  Abdominal pain.  EXAM: PORTABLE ABDOMEN - 1 VIEW  COMPARISON:  CT scan of May 11, 2013.  FINDINGS: The bowel gas pattern is normal. No renal calculi are noted. Phleboliths are seen in the left pelvis. Atherosclerotic calcifications of abdominal aorta are noted. Degenerative changes of lower thoracic spine are noted.  IMPRESSION: No definite evidence of bowel obstruction or ileus.   Electronically Signed   By: Sabino Dick M.D.   On: 11/30/2013 08:29    Scheduled Meds: . docusate sodium  100 mg Oral BID  . furosemide  80 mg Intravenous BID  . LORazepam  0.5 mg Intravenous Q12H  . oxyCODONE  5 mg Oral TID  . sodium chloride  3 mL Intravenous Q12H   Continuous Infusions:   Active Problems:   Chronic atrial fibrillation   Acute on chronic diastolic heart failure- EF 60-65% 11/14   UTI (lower urinary tract infection)   Failure to thrive- SNF pt   Venous stasis ulcers   Diabetes mellitus with diabetic nephropathy   Anasarca   Hypokalemia   Chronic kidney disease, stage III (moderate)   Pleural effusion   Acute respiratory failure    Acute encephalopathy   Ulcer of sacral region, stage 1   Hypotension    Time spent: 30 min    [facility] Pager [phone]. If 7PM-7AM, please contact night-coverage at [email or web address], password [facility] 12/01/2013, 12:33 PM  LOS: 2 days

## 2013-12-10 ENCOUNTER — Ambulatory Visit: Payer: Medicare Other | Admitting: Physician Assistant

## 2013-12-26 NOTE — Progress Notes (Signed)
Patient passed away at 0755. Time of death called and confirmed by Rexford Maus, RN and Zenon Mayo, RN. Dr. Sheran Fava notified of patient's passing and she stated she would notify family. [facility] and patient is not eligible for donation. Will be released to funeral home.

## 2013-12-26 NOTE — Discharge Summary (Signed)
Death Summary  ELYSSE [name] MIW:[id number] DOB: 1932/06/05 DOA: December 10, 2013  PCP: Cathlean Cower, MD  Admit date: 12/10/2013 Date of Death: 13-Dec-2013  Final Diagnoses:  Principal Problem:   Acute on chronic diastolic heart failure- EF 60-65% 11/14 Active Problems:   Chronic atrial fibrillation   UTI (lower urinary tract infection)   Failure to thrive- SNF pt   Venous stasis ulcers   Diabetes mellitus with diabetic nephropathy   Anasarca   Hypokalemia   Chronic kidney disease, stage III (moderate)   Pleural effusion   Acute respiratory failure   Acute encephalopathy   Ulcer of sacral region, stage 1   Hypotension    History of present illness:   [name] is a 80 y.o. female with Multiple Medical Problems who was sent from the [facility] SNF to the ED due to Unresponsiveness found in the evening. Per staff and family patient at baseline is bed-bound but she is verbal and interactive. This was a sudden change. Patient was found with [place], and placed on supplemental O2 in the ED. She was evaluated and found on chest X-ray to have a large Right Sided Effusion with complete White out of the Right Lung and Pulmonary vascular congestion of the right lung. She was also found by labs to have a UTI, and AKI and hypokalemia. Her family discussed treatment options and wished for patient to be made comfortable and not pursue aggressive measures.   Hospital Course:   Acute Respiratory Failure due to large Right Pleural Effusion and Pulmonary Vascular congestion on Left from acute on chronic diastolic heart failure. She was diuresed with IV lasix, however, she continued to have respiratory distress and was started on as needed morphine. Palliative care discussed goals of care with the HPOA and family and they decided to transition to full comfort measures.  She died of progressive respiratory failure in her sleep the morning of 12/13/2013.    Abdominal pain, LFTs and lipase and lactic  acid were all normal. KUB demonstrated no evidence of obstruction or ileus.  She was given colace, senna, bisacodyl prn and foley was placed for comfort.  Pain was relieved with morphine.   Anasarca/Failure to Thrive and Hypoalbuminemia, she tolerated a dysphagia 1 diet with thin liquids.    UTI, Urine culture with mixed flora.  Initially started on empiric ceftriaxone until her urine culture was reported.    Acute Encephalopathy- due to heart failure, effusion superimposed on dementia. Mentation improved transiently but she became progressively more lethargic.  She had been awaiting inpatient hospice prior to death.   A-fib, tachycardic. Her oral medications were discontinued when her GOC were transitioned to comfort only.   Hypotension, intermittently, likely due to heart failure.  Her BP medications were held.  She continued diuresis for comfort.    DM, comfort measures only. D/c'd CBG.   Hyperkalemia, was initially treated with calcium, bicarb, and kayexelate with lasix, however, these measures were discontinued and she was transitioned to comfort measures only   Sacral Ulcer Stage I- Wound Care PRN.   Venous Stasis Ulcers of BLE-  Unna Boots on both Lower Extremities.    Time of death 0755  Signed:  Janece Canterbury  [facility] 2013-12-13, 8:33 AM

## 2013-12-26 DEATH — deceased

## 2015-07-09 IMAGING — CT CT HEAD W/O CM
1 of 2 series · 16 of 30 positions shown, 20 images · non-contrast
Comparison: 04/02/2013

CLINICAL DATA: Not using right arm, left hand and the closed
position

EXAM:
CT HEAD WITHOUT CONTRAST
TECHNIQUE: Contiguous axial images were obtained from the base of the skull
through the vertex without intravenous contrast.

[Series 3: head 2.0 h70h · axial · 0.46mm/px · z∈[-159,-3]mm · 16 of 88 slices shown, 20 images]
[im 5/88  brain]
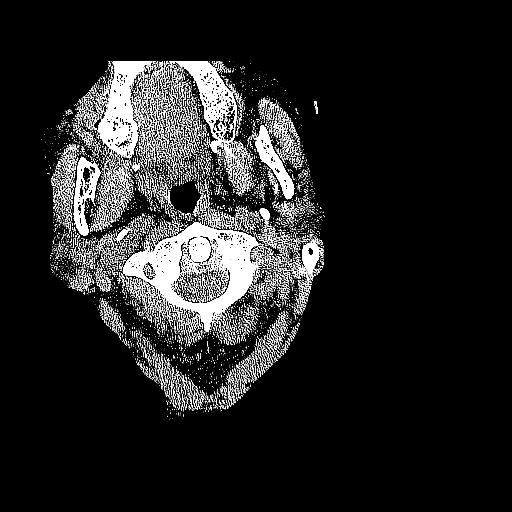
[im 5/88  bone]
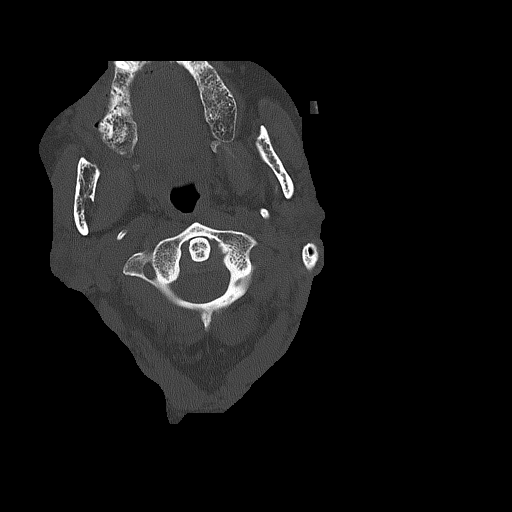
[im 9/88  brain]
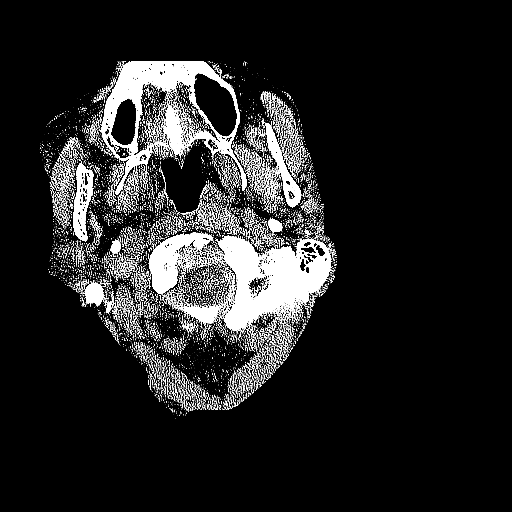
[im 14/88  brain]
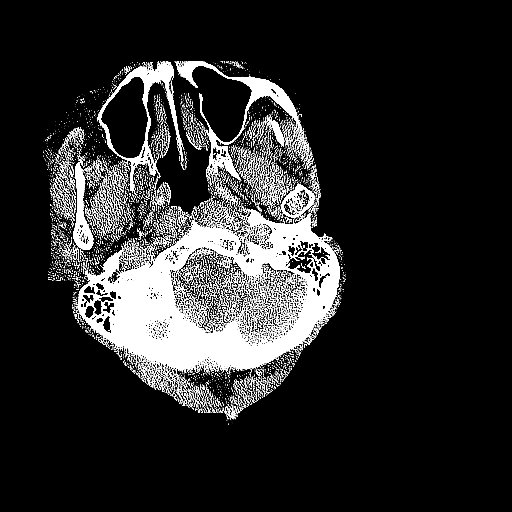
[im 22/88  brain]
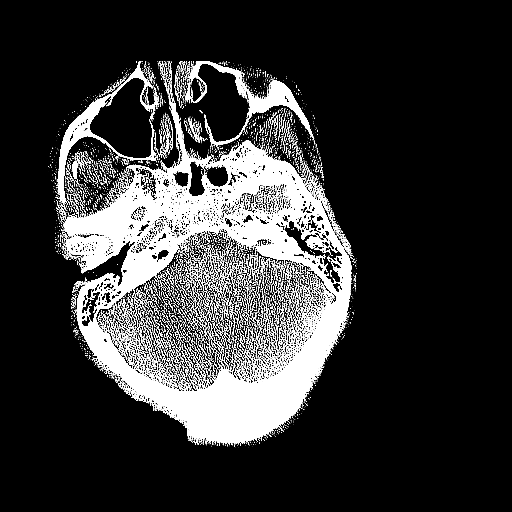
[im 27/88  brain]
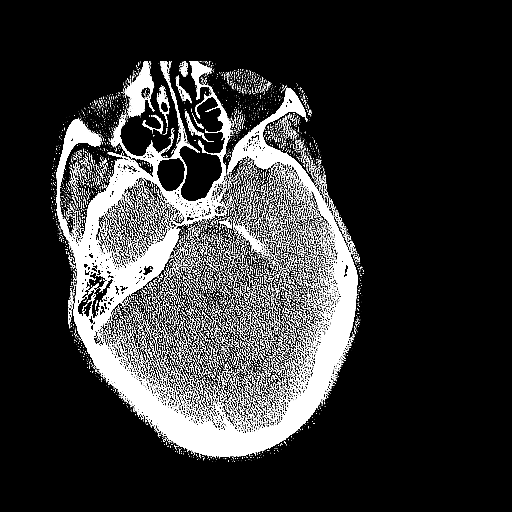
[im 27/88  bone]
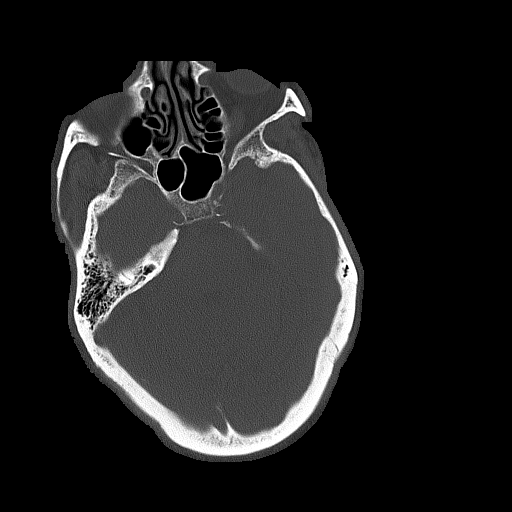
[im 31/88  brain]
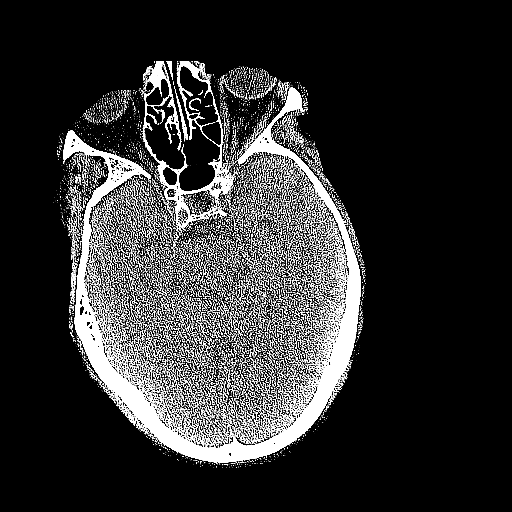
[im 35/88  brain]
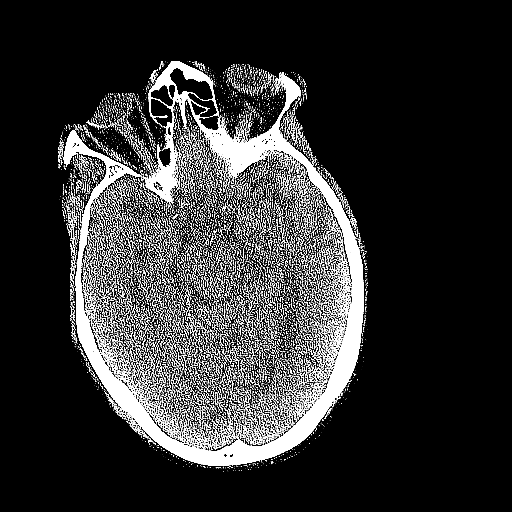
[im 40/88  brain]
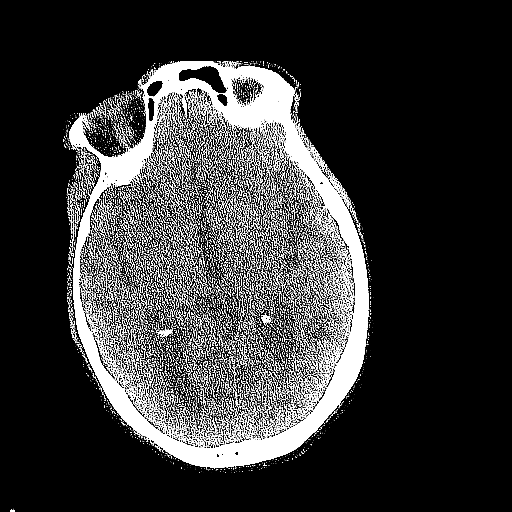
[im 48/88  brain]
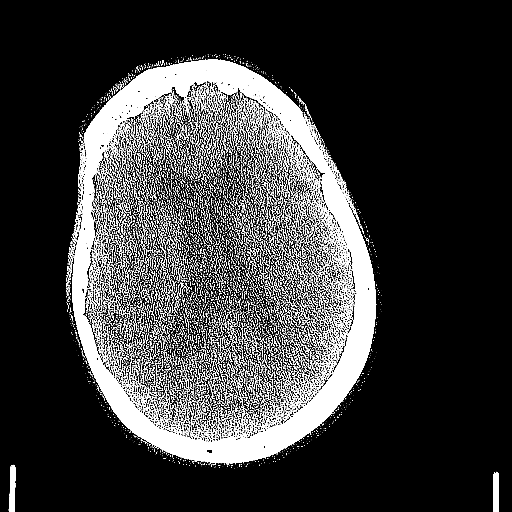
[im 48/88  bone]
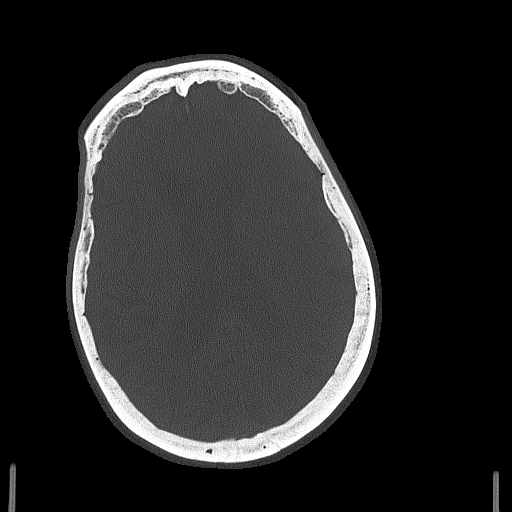
[im 53/88  brain]
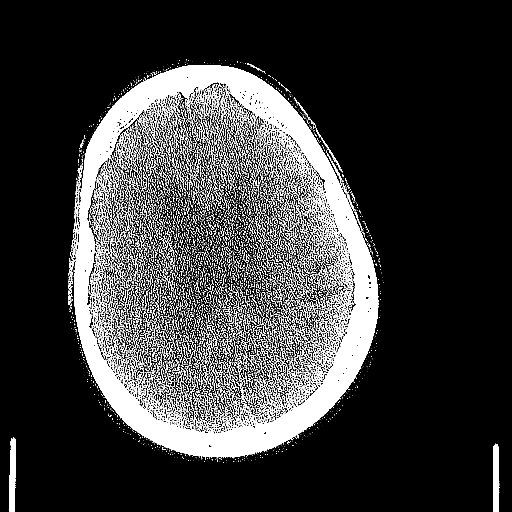
[im 57/88  brain]
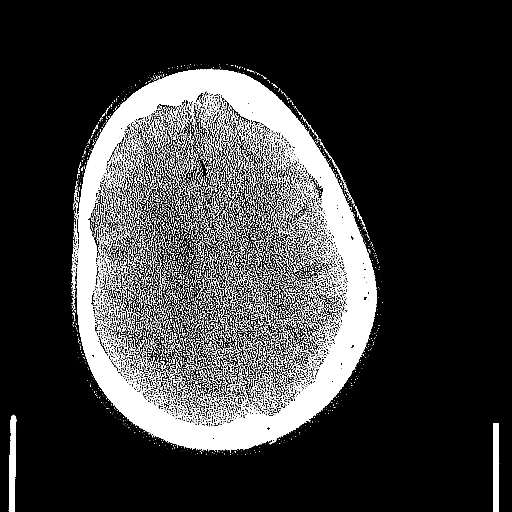
[im 61/88  brain]
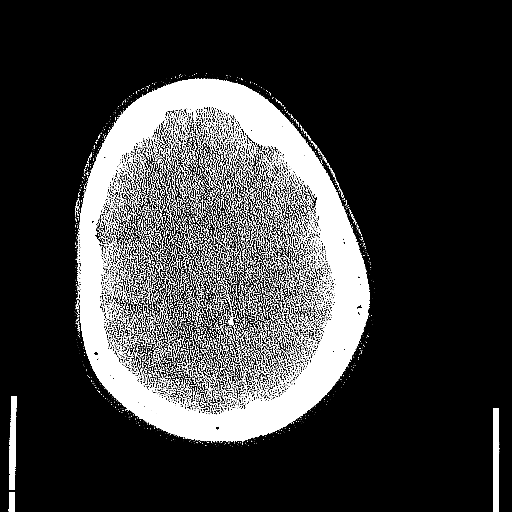
[im 66/88  brain]
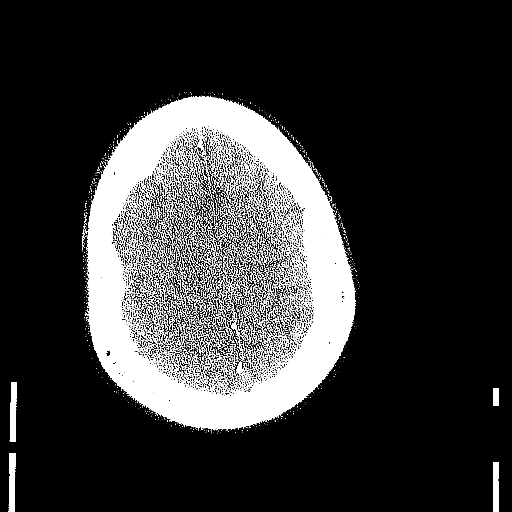
[im 66/88  bone]
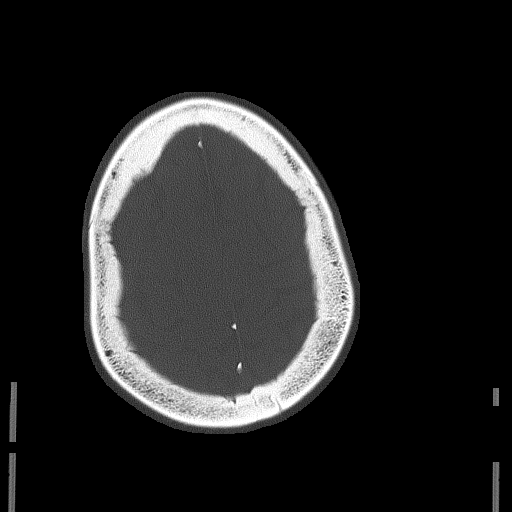
[im 74/88  brain]
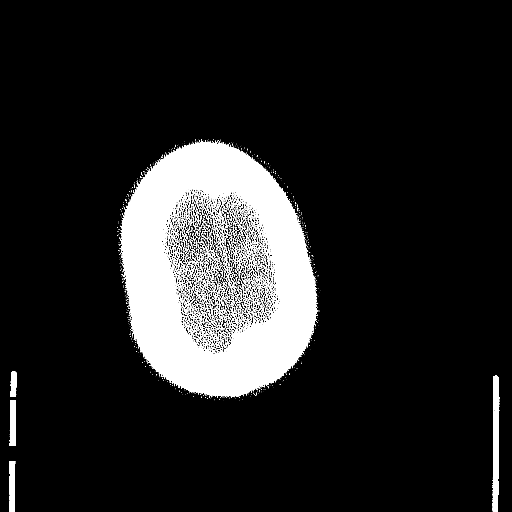
[im 79/88  brain]
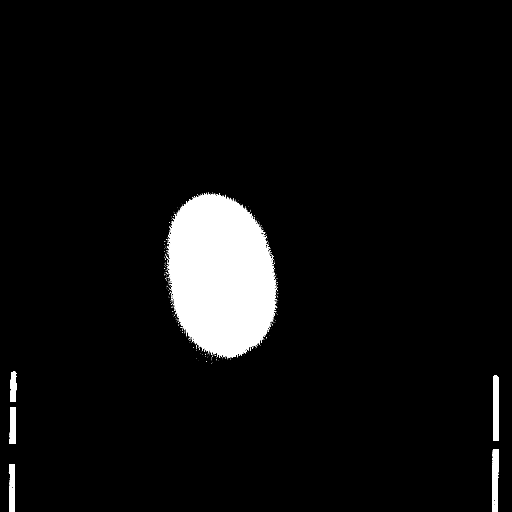
[im 83/88  brain]
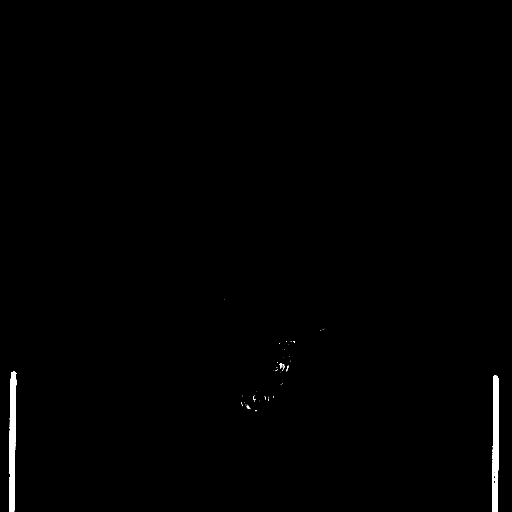

[16 of 30 positions shown; findings below may reference images not displayed]

FINDINGS: Stable diffuse atrophy. Stable low attenuation in the deep white
matter. Small chronic lacunar infarct left basal ganglia, stable. No
acute vascular territory infarct. No hemorrhage or extra-axial
fluid. No skull fracture.
IMPRESSION: No acute abnormalities. Chronic involutional change again
identified.

## 2015-10-24 IMAGING — CR DG CHEST 2V
2 series · 2 of 2 positions shown · non-contrast
Comparison: Prior chest x-ray 05/10/2013; CT abdomen/pelvis
05/11/2013

CLINICAL DATA: Anasarca, dyspnea

EXAM:
CHEST  2 VIEW

[w chest lat]
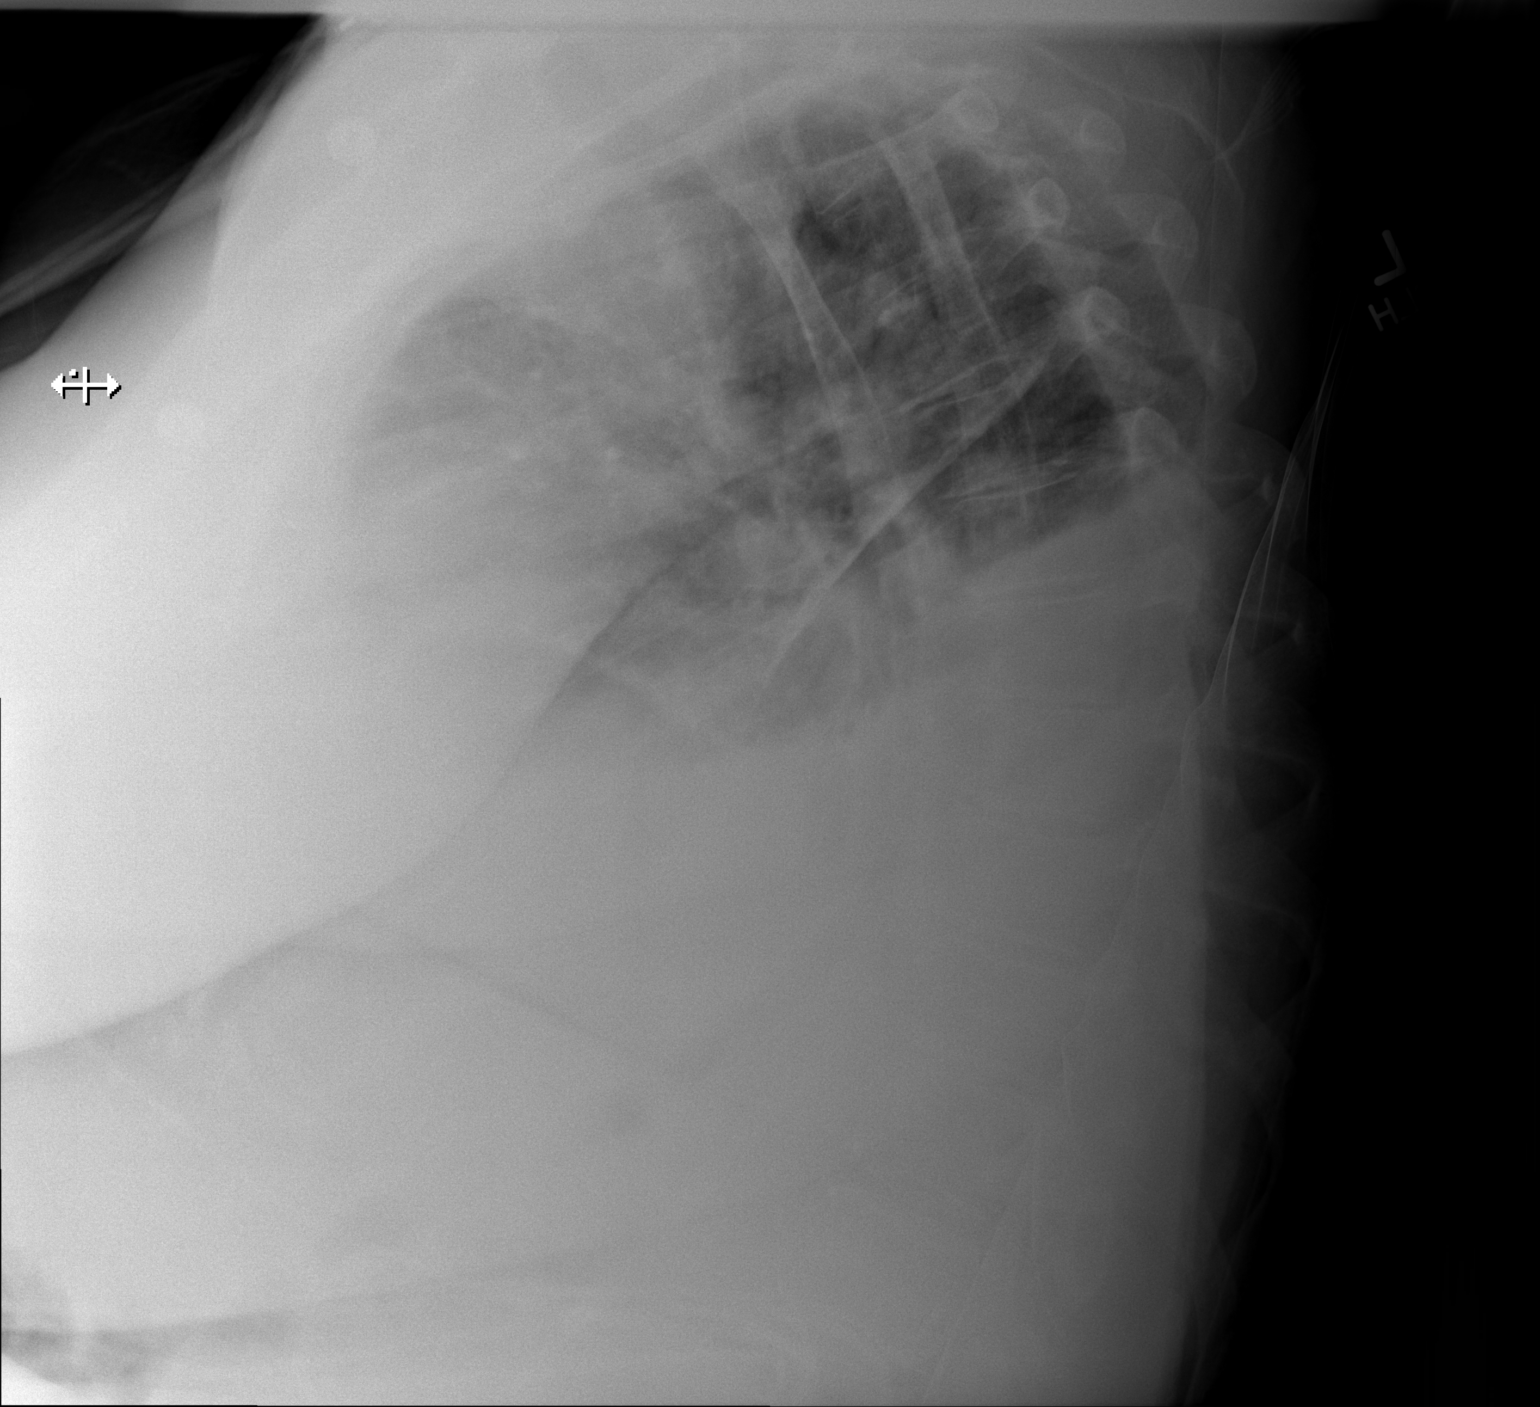

[x chest ap]
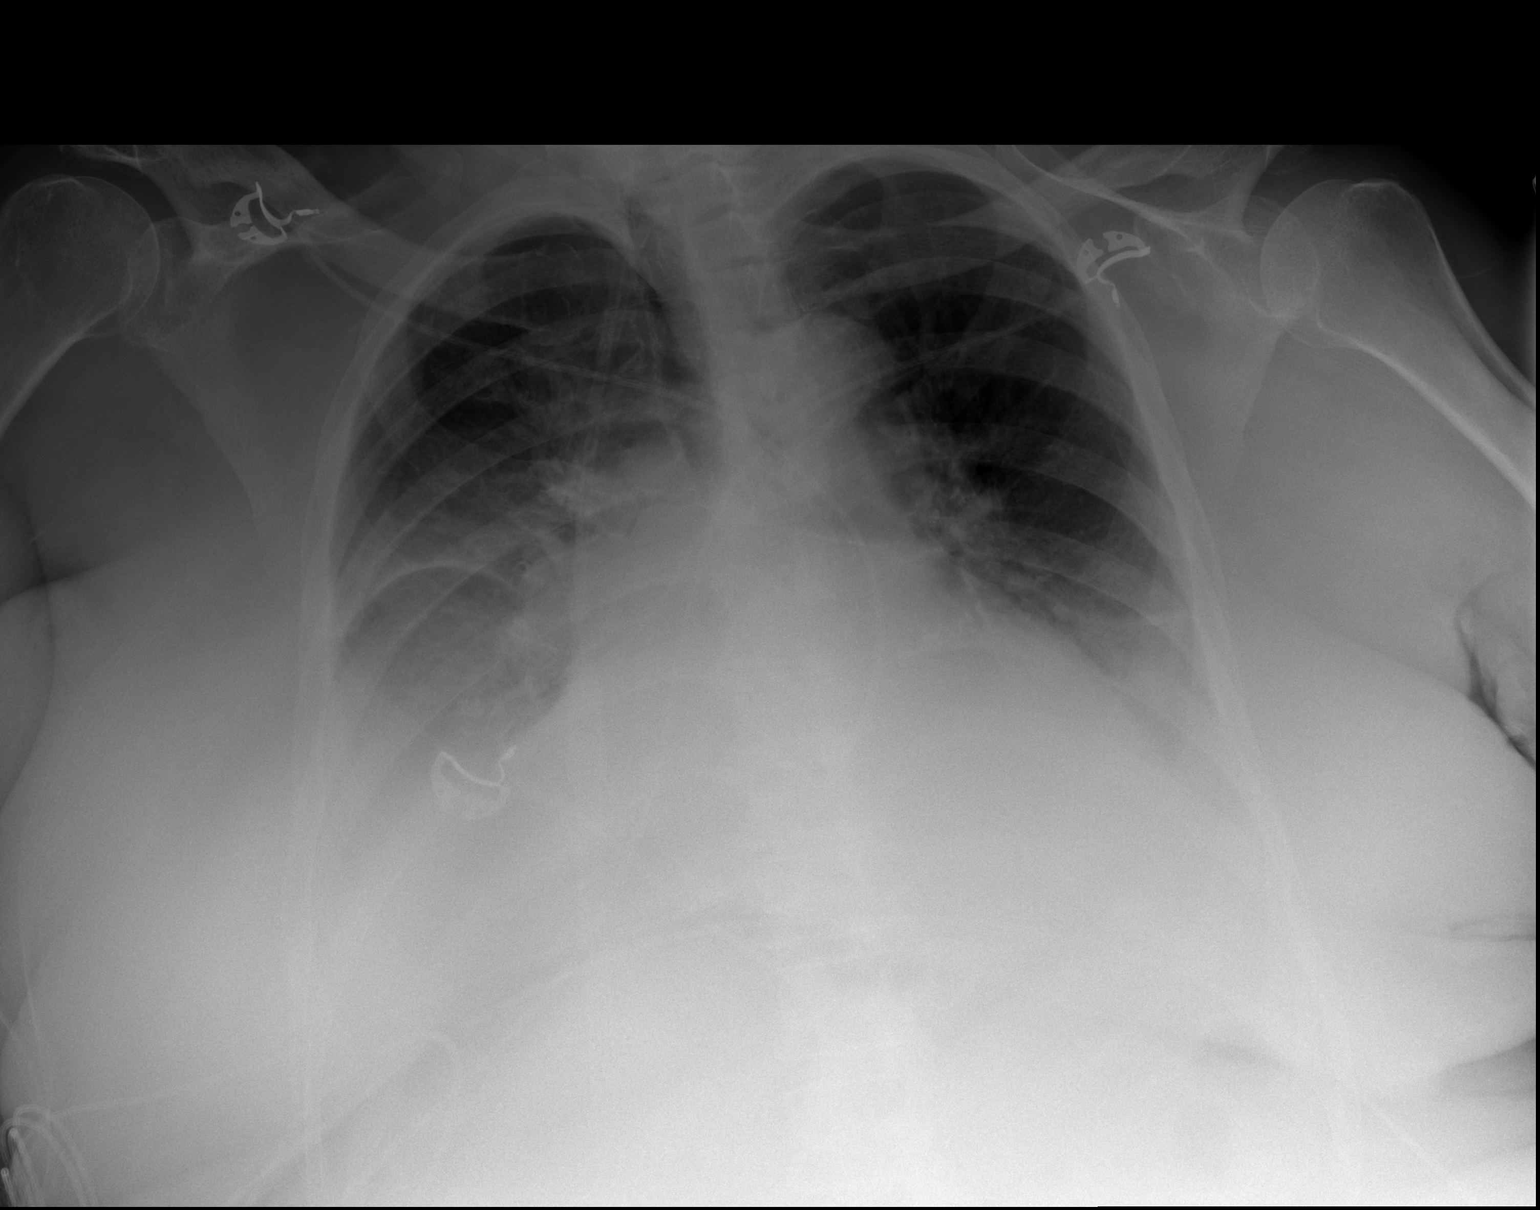

[2 of 2 positions shown; findings below may reference images not displayed]

FINDINGS: Marked enlargement of the cardiopericardial silhouette. Unchanged
mediastinal contours. Moderate right slightly larger than left
bilateral layering pleural effusions with associated bibasilar
opacities. Pulmonary vascular congestion with mild edema. No
pneumothorax. Osseous structures are intact and unremarkable for
age.
IMPRESSION: 1. Right greater than left layering pleural effusions with
associated bibasilar opacities favored to reflect atelectasis.
2. Pulmonary vascular congestion with perhaps mild interstitial
edema.
3. Marked cardiomegaly.
